# Patient Record
Sex: Male | Born: 1935
Health system: Southern US, Community
[De-identification: ages and names within clinical notes are randomized; demographics above are authoritative.]

## PROBLEM LIST (undated history)

## (undated) DIAGNOSIS — I5031 Acute diastolic (congestive) heart failure: Secondary | ICD-10-CM

## (undated) HISTORY — PX: APPENDECTOMY: SHX54

## (undated) HISTORY — DX: Acute diastolic (congestive) heart failure: I50.31

---

## 2019-07-11 ENCOUNTER — Emergency Department (HOSPITAL_COMMUNITY): Payer: Medicare Other

## 2019-07-11 ENCOUNTER — Other Ambulatory Visit: Payer: Self-pay

## 2019-07-11 ENCOUNTER — Inpatient Hospital Stay (HOSPITAL_COMMUNITY)
Admission: EM | Admit: 2019-07-11 | Discharge: 2019-07-15 | DRG: 292 | Disposition: A | Discharge status: Home / Self Care | Payer: Medicare Other | Attending: Internal Medicine | Admitting: Internal Medicine

## 2019-07-11 ENCOUNTER — Encounter (HOSPITAL_COMMUNITY): Payer: Self-pay

## 2019-07-11 DIAGNOSIS — F17210 Nicotine dependence, cigarettes, uncomplicated: Secondary | ICD-10-CM | POA: Diagnosis present

## 2019-07-11 DIAGNOSIS — Z9889 Other specified postprocedural states: Secondary | ICD-10-CM

## 2019-07-11 DIAGNOSIS — I4891 Unspecified atrial fibrillation: Secondary | ICD-10-CM

## 2019-07-11 DIAGNOSIS — I5032 Chronic diastolic (congestive) heart failure: Secondary | ICD-10-CM

## 2019-07-11 DIAGNOSIS — J9811 Atelectasis: Secondary | ICD-10-CM | POA: Diagnosis present

## 2019-07-11 DIAGNOSIS — I5033 Acute on chronic diastolic (congestive) heart failure: Secondary | ICD-10-CM | POA: Diagnosis present

## 2019-07-11 DIAGNOSIS — Z720 Tobacco use: Secondary | ICD-10-CM | POA: Diagnosis not present

## 2019-07-11 DIAGNOSIS — J449 Chronic obstructive pulmonary disease, unspecified: Secondary | ICD-10-CM | POA: Diagnosis present

## 2019-07-11 DIAGNOSIS — I444 Left anterior fascicular block: Secondary | ICD-10-CM | POA: Diagnosis present

## 2019-07-11 DIAGNOSIS — I5031 Acute diastolic (congestive) heart failure: Secondary | ICD-10-CM

## 2019-07-11 DIAGNOSIS — Z79899 Other long term (current) drug therapy: Secondary | ICD-10-CM | POA: Diagnosis not present

## 2019-07-11 DIAGNOSIS — Z20828 Contact with and (suspected) exposure to other viral communicable diseases: Secondary | ICD-10-CM | POA: Diagnosis present

## 2019-07-11 DIAGNOSIS — R0600 Dyspnea, unspecified: Secondary | ICD-10-CM

## 2019-07-11 DIAGNOSIS — J918 Pleural effusion in other conditions classified elsewhere: Secondary | ICD-10-CM | POA: Diagnosis present

## 2019-07-11 DIAGNOSIS — I110 Hypertensive heart disease with heart failure: Secondary | ICD-10-CM | POA: Diagnosis present

## 2019-07-11 DIAGNOSIS — J90 Pleural effusion, not elsewhere classified: Secondary | ICD-10-CM

## 2019-07-11 DIAGNOSIS — R0603 Acute respiratory distress: Secondary | ICD-10-CM | POA: Diagnosis present

## 2019-07-11 DIAGNOSIS — I4819 Other persistent atrial fibrillation: Secondary | ICD-10-CM | POA: Diagnosis present

## 2019-07-11 DIAGNOSIS — Z9981 Dependence on supplemental oxygen: Secondary | ICD-10-CM | POA: Diagnosis not present

## 2019-07-11 HISTORY — DX: Unspecified atrial fibrillation: I48.91

## 2019-07-11 LAB — SARS CORONAVIRUS 2 (TAT 6-24 HRS): SARS Coronavirus 2: NEGATIVE

## 2019-07-11 LAB — TROPONIN I (HIGH SENSITIVITY)
Troponin I (High Sensitivity): 10 ng/L (ref <18)
Troponin I (High Sensitivity): 8 ng/L (ref <18)

## 2019-07-11 LAB — CBC
HCT: 43.5 % (ref 39.0–52.0)
Hemoglobin: 14.4 g/dL (ref 13.0–17.0)
MCH: 30.3 pg (ref 26.0–34.0)
MCHC: 33.1 g/dL (ref 30.0–36.0)
MCV: 91.4 fL (ref 80.0–100.0)
Platelets: 214 10*3/uL (ref 150–400)
RBC: 4.76 MIL/uL (ref 4.22–5.81)
RDW: 13.4 % (ref 11.5–15.5)
WBC: 7.3 10*3/uL (ref 4.0–10.5)
nRBC: 0 % (ref 0.0–0.2)

## 2019-07-11 LAB — HEMOGLOBIN A1C
Hgb A1c MFr Bld: 5.8 % — ABNORMAL HIGH (ref 4.8–5.6)
Mean Plasma Glucose: 119.76 mg/dL

## 2019-07-11 LAB — ECHOCARDIOGRAM COMPLETE
Height: 74 in
Weight: 3587.33 oz

## 2019-07-11 LAB — BASIC METABOLIC PANEL
Anion gap: 13 (ref 5–15)
BUN: 10 mg/dL (ref 8–23)
CO2: 21 mmol/L — ABNORMAL LOW (ref 22–32)
Calcium: 8.5 mg/dL — ABNORMAL LOW (ref 8.9–10.3)
Chloride: 101 mmol/L (ref 98–111)
Creatinine, Ser: 0.95 mg/dL (ref 0.61–1.24)
GFR calc Af Amer: >60 mL/min (ref >60)
GFR calc non Af Amer: >60 mL/min (ref >60)
Glucose, Bld: 129 mg/dL — ABNORMAL HIGH (ref 70–99)
Potassium: 3.9 mmol/L (ref 3.5–5.1)
Sodium: 135 mmol/L (ref 135–145)

## 2019-07-11 LAB — TSH: TSH: 0.747 u[IU]/mL (ref 0.350–4.500)

## 2019-07-11 LAB — MAGNESIUM
Magnesium: 2.9 mg/dL — ABNORMAL HIGH (ref 1.7–2.4)
Magnesium: 2.1 mg/dL (ref 1.7–2.4)

## 2019-07-11 LAB — BRAIN NATRIURETIC PEPTIDE: B Natriuretic Peptide: 300.3 pg/mL — ABNORMAL HIGH (ref 0.0–100.0)

## 2019-07-11 MED ORDER — NICOTINE 21 MG/24HR TD PT24
21.0000 mg | MEDICATED_PATCH | Freq: Every day | TRANSDERMAL | Status: DC
  Administered 2019-07-11 – 2019-07-15 (×5): 21 mg via TRANSDERMAL
  Filled 2019-07-11 (×5): qty 1

## 2019-07-11 MED ORDER — SODIUM CHLORIDE 0.9 % IV SOLN
1.0000 g | Freq: Once | INTRAVENOUS | Status: AC
  Administered 2019-07-11: 1 g via INTRAVENOUS
  Filled 2019-07-11: qty 10

## 2019-07-11 MED ORDER — ALBUTEROL SULFATE HFA 108 (90 BASE) MCG/ACT IN AERS
2.0000 | INHALATION_SPRAY | RESPIRATORY_TRACT | Status: AC
  Administered 2019-07-11: 09:00:00 2 via RESPIRATORY_TRACT
  Filled 2019-07-11: qty 6.7

## 2019-07-11 MED ORDER — AZITHROMYCIN 250 MG PO TABS
500.0000 mg | ORAL_TABLET | Freq: Once | ORAL | Status: AC
  Administered 2019-07-11: 500 mg via ORAL
  Filled 2019-07-11: qty 2

## 2019-07-11 MED ORDER — POTASSIUM CHLORIDE CRYS ER 20 MEQ PO TBCR
40.0000 meq | EXTENDED_RELEASE_TABLET | Freq: Once | ORAL | Status: AC
  Administered 2019-07-11: 40 meq via ORAL
  Filled 2019-07-11: qty 2

## 2019-07-11 MED ORDER — APIXABAN 5 MG PO TABS
5.0000 mg | ORAL_TABLET | Freq: Two times a day (BID) | ORAL | Status: DC
  Administered 2019-07-11 (×2): 5 mg via ORAL
  Filled 2019-07-11 (×3): qty 1

## 2019-07-11 MED ORDER — AEROCHAMBER PLUS FLO-VU LARGE MISC
1.0000 | Freq: Once | Status: AC
  Administered 2019-07-11: 09:00:00 1

## 2019-07-11 MED ORDER — IPRATROPIUM-ALBUTEROL 0.5-2.5 (3) MG/3ML IN SOLN
3.0000 mL | Freq: Once | RESPIRATORY_TRACT | Status: AC
  Administered 2019-07-11: 16:00:00 3 mL via RESPIRATORY_TRACT
  Filled 2019-07-11: qty 3

## 2019-07-11 MED ORDER — FUROSEMIDE 10 MG/ML IJ SOLN
40.0000 mg | INTRAMUSCULAR | Status: AC
  Administered 2019-07-11: 12:00:00 40 mg via INTRAVENOUS
  Filled 2019-07-11: qty 4

## 2019-07-11 MED ORDER — POTASSIUM CHLORIDE CRYS ER 20 MEQ PO TBCR
20.0000 meq | EXTENDED_RELEASE_TABLET | Freq: Two times a day (BID) | ORAL | Status: DC
  Administered 2019-07-12 – 2019-07-15 (×7): 20 meq via ORAL
  Filled 2019-07-11 (×7): qty 1

## 2019-07-11 MED ORDER — FUROSEMIDE 10 MG/ML IJ SOLN
80.0000 mg | Freq: Two times a day (BID) | INTRAMUSCULAR | Status: DC
  Administered 2019-07-12 (×2): 80 mg via INTRAVENOUS
  Filled 2019-07-11 (×2): qty 8

## 2019-07-11 MED ORDER — IOHEXOL 350 MG/ML SOLN
75.0000 mL | Freq: Once | INTRAVENOUS | Status: AC | PRN
  Administered 2019-07-11: 75 mL via INTRAVENOUS

## 2019-07-11 MED ORDER — AEROCHAMBER PLUS FLO-VU LARGE MISC
Status: AC
  Administered 2019-07-11: 1
  Filled 2019-07-11: qty 1

## 2019-07-11 MED ORDER — FUROSEMIDE 10 MG/ML IJ SOLN: 40.0000 mg | Freq: Once | INTRAMUSCULAR | Status: DC

## 2019-07-11 MED ORDER — DILTIAZEM LOAD VIA INFUSION
20.0000 mg | Freq: Once | INTRAVENOUS | Status: AC
  Administered 2019-07-11: 20 mg via INTRAVENOUS
  Filled 2019-07-11: qty 20

## 2019-07-11 MED ORDER — ASPIRIN 81 MG PO CHEW
324.0000 mg | CHEWABLE_TABLET | Freq: Once | ORAL | Status: AC
  Administered 2019-07-11: 324 mg via ORAL
  Filled 2019-07-11: qty 4

## 2019-07-11 MED ORDER — DILTIAZEM HCL-DEXTROSE 125-5 MG/125ML-% IV SOLN (PREMIX)
5.0000 mg/h | INTRAVENOUS | Status: DC
  Administered 2019-07-11 (×3): 5 mg/h via INTRAVENOUS
  Filled 2019-07-11 (×2): qty 125

## 2019-07-11 MED ORDER — FUROSEMIDE 10 MG/ML IJ SOLN
80.0000 mg | Freq: Once | INTRAMUSCULAR | Status: AC
  Administered 2019-07-11: 18:00:00 80 mg via INTRAVENOUS
  Filled 2019-07-11: qty 8

## 2019-07-11 NOTE — ED Notes (Signed)
ED TO INPATIENT HANDOFF REPORT  ED Nurse Name and Phone #: Seward Grater [phone]  S Name/Age/Gender Alfred Mercado 83 y.o. male Room/Bed: 015C/015C  Code Status   Code Status: Full Code  Home/SNF/Other Home Patient oriented to: self, place, time and situation Is this baseline? Yes   Triage Complete: Triage complete  Chief Complaint afib  Triage Note Pt BIB [facility], Pt c/o SHOB x1 week. Pt reports he has not seen a doctor in over 20 years. EMS reports pt is in A-fib, but pt is unaware how long that has been going on. Pt is currently on 2L of O2 but does not wear O2 at home. EMS gave 2 grams of Mg and 125 of solumedrol.   Allergies No Known Allergies  Level of Care/Admitting Diagnosis ED Disposition    ED Disposition Condition Comment   Admit  Hospital Area: [facility] [[id number]]  Level of Care: Telemetry Cardiac [103]  Covid Evaluation: N/A  Diagnosis: Atrial fibrillation with RVR [facility]) [[id number]]  Admitting Physician: [name] [[id number]]  Attending Physician: [name] [[id number]]  Estimated length of stay: 3 - 4 days  Certification:: I certify this patient will need inpatient services for at least 2 midnights  PT Class (Do Not Modify): Inpatient [101]  PT Acc Code (Do Not Modify): Private [1]       B Medical/Surgery History   A IV Location/Drains/Wounds Patient Lines/Drains/Airways Status   Active Line/Drains/Airways    Name:   Placement date:   Placement time:   Site:   Days:   Peripheral IV 07/11/19 Left Arm   07/11/19    0855    Arm   less than 1   Peripheral IV 07/11/19 Right Hand   07/11/19    0859    Hand   less than 1          Intake/Output Last 24 hours  Intake/Output Summary (Last 24 hours) at 07/11/2019 1325 Last data filed at 07/11/2019 1234 Gross per 24 hour  Intake 131.74 ml  Output 350 ml  Net -218.26 ml    Labs/Imaging Results for orders placed or performed during the hospital encounter of 07/11/19 (from the  past 48 hour(s))  Basic metabolic panel     Status: Abnormal   Collection Time: 07/11/19  8:55 AM  Result Value Ref Range   Sodium 135 135 - 145 mmol/L   Potassium 3.9 3.5 - 5.1 mmol/L   Chloride 101 98 - 111 mmol/L   CO2 21 (L) 22 - 32 mmol/L   Glucose, Bld 129 (H) 70 - 99 mg/dL   BUN 10 8 - 23 mg/dL   Creatinine, Ser 4.01 0.61 - 1.24 mg/dL   Calcium 8.5 (L) 8.9 - 10.3 mg/dL   GFR calc non Af Amer >60 >60 mL/min   GFR calc Af Amer >60 >60 mL/min   Anion gap 13 5 - 15    Comment: Performed at [facility], [address], [place], [place] [address]  Magnesium     Status: Abnormal   Collection Time: 07/11/19  8:55 AM  Result Value Ref Range   Magnesium 2.9 (H) 1.7 - 2.4 mg/dL    Comment: Performed at [facility], [address], [place], [place] [address]  CBC     Status: None   Collection Time: 07/11/19  8:55 AM  Result Value Ref Range   WBC 7.3 4.0 - 10.5 K/uL   RBC 4.76 4.22 - 5.81 MIL/uL   Hemoglobin 14.4  13.0 - 17.0 g/dL   HCT 78.2 95.6 - 21.3 %   MCV 91.4 80.0 - 100.0 fL   MCH 30.3 26.0 - 34.0 pg   MCHC 33.1 30.0 - 36.0 g/dL   RDW 08.6 57.8 - 46.9 %   Platelets 214 150 - 400 K/uL   nRBC 0.0 0.0 - 0.2 %    Comment: Performed at [facility], [address], [place], [place] [address]  Troponin I (High Sensitivity)     Status: None   Collection Time: 07/11/19  8:55 AM  Result Value Ref Range   Troponin I (High Sensitivity) 10 <18 ng/L    Comment: (NOTE) Elevated high sensitivity troponin I (hsTnI) values and significant  changes across serial measurements may suggest ACS but many other  chronic and acute conditions are known to elevate hsTnI results.  Refer to the "Links" section for chest pain algorithms and additional  guidance. Performed at [facility], [address] [place], [place] [address]   Brain natriuretic peptide     Status: Abnormal   Collection Time: 07/11/19  8:55 AM  Result Value Ref Range   B Natriuretic Peptide 300.3  (H) 0.0 - 100.0 pg/mL    Comment: Performed at [facility], [address], [place], [place] [address]  Troponin I (High Sensitivity)     Status: None   Collection Time: 07/11/19 11:43 AM  Result Value Ref Range   Troponin I (High Sensitivity) 8 <18 ng/L    Comment: (NOTE) Elevated high sensitivity troponin I (hsTnI) values and significant  changes across serial measurements may suggest ACS but many other  chronic and acute conditions are known to elevate hsTnI results.  Refer to the "Links" section for chest pain algorithms and additional  guidance. Performed at [facility], [address] [place], [place] [address]   Magnesium     Status: None   Collection Time: 07/11/19 11:43 AM  Result Value Ref Range   Magnesium 2.1 1.7 - 2.4 mg/dL    Comment: Performed at [facility], [address], [place], [place] [address]   Ct Angio Chest Pe W And/or Wo Contrast  Result Date: 07/11/2019 CLINICAL DATA:  Shortness of breath. EXAM: CT ANGIOGRAPHY CHEST WITH CONTRAST TECHNIQUE: Multidetector CT imaging of the chest was performed using the standard protocol during bolus administration of intravenous contrast. Multiplanar CT image reconstructions and MIPs were obtained to evaluate the vascular anatomy. CONTRAST:  52mL OMNIPAQUE IOHEXOL 350 MG/ML SOLN COMPARISON:  Chest x-ray earlier today. FINDINGS: Cardiovascular: The pulmonary arteries are well opacified. There is no evidence of pulmonary embolism. Central pulmonary arteries are normal in caliber. The thoracic aorta is normal in caliber. The heart is top-normal in size. Extensive calcified coronary artery plaque is noted in a 3 vessel distribution and including the left main coronary artery. No pericardial fluid identified. Mediastinum/Nodes: No enlarged mediastinal, hilar, or axillary lymph nodes. Thyroid gland, trachea, and esophagus demonstrate no significant findings. Lungs/Pleura: Moderate right pleural effusion and small  left pleural effusion present. Associated bilateral lower lobe atelectasis, right greater than left. Aerated lungs demonstrate at least pulmonary venous hypertension and potentially component of pulmonary interstitial edema. No pneumothorax or focal masses. Upper Abdomen: Left lobe hepatic cyst has a benign appearance and measures 2.5 cm. Musculoskeletal: No chest wall abnormality. No acute or significant osseous findings. Review of the MIP images confirms the above findings. IMPRESSION: 1. No evidence of pulmonary embolism. 2. Moderate right  pleural effusion and small left pleural effusion with associated bilateral lower lobe atelectasis, right greater than left. 3. Probable pulmonary venous hypertension and interstitial edema. 4. Coronary atherosclerosis with calcified coronary artery plaque noted in a 3 vessel distribution and including the left main coronary artery. Electronically Signed   By: Irish LackGlenn  Yamagata M.D.   On: 07/11/2019 10:50   Dg Chest Port 1 View  Result Date: 07/11/2019 CLINICAL DATA:  Cough and shortness of breath the past week. Atrial fibrillation. EXAM: PORTABLE CHEST 1 VIEW COMPARISON:  None. FINDINGS: Enlarged cardiac silhouette. Prominent pulmonary vasculature and interstitial markings, with Kerley lines. Moderately large right pleural effusion and probable small left pleural effusion. Mild scoliosis. IMPRESSION: Cardiomegaly and acute congestive heart failure Electronically Signed   By: Beckie SaltsSteven  Reid M.D.   On: 07/11/2019 10:01    Pending Labs Unresulted Labs (From admission, onward)    Start     Ordered   07/12/19 0500  Basic metabolic panel  Tomorrow morning,   R     07/11/19 1217   07/12/19 0500  CBC  Tomorrow morning,   R     07/11/19 1217   07/11/19 1218  Hemoglobin A1c  Add-on,   AD     07/11/19 1217   07/11/19 1145  TSH  Once,   R     07/11/19 1145   07/11/19 0853  SARS CORONAVIRUS 2 (TAT 6-24 HRS) Nasopharyngeal Nasopharyngeal Swab  (Asymptomatic/Tier 2 Patients  Labs)  Once,   STAT    Question Answer Comment  Is this test for diagnosis or screening Diagnosis of ill patient   Symptomatic for COVID-19 as defined by CDC Yes   Date of Symptom Onset 07/04/2019   Hospitalized for COVID-19 No   Admitted to ICU for COVID-19 No   Previously tested for COVID-19 No   Resident in a congregate (group) care setting No   Employed in healthcare setting No      07/11/19 0853          Vitals/Pain Today's Vitals   07/11/19 1200 07/11/19 1215 07/11/19 1245 07/11/19 1315  BP: (!) 157/71 (!) 137/92 (!) 142/76 (!) 122/94  Pulse: 100 (!) 157 89   Resp: (!) 38 (!) 38 (!) 30 (!) 22  Temp:      TempSrc:      SpO2: 94% 95% 96%   Weight:      Height:      PainSc:        Isolation Precautions No active isolations  Medications Medications  diltiazem (CARDIZEM) 1 mg/mL load via infusion 20 mg (20 mg Intravenous Bolus from Bag 07/11/19 0927)    And  diltiazem (CARDIZEM) 125 mg in dextrose 5% 125 mL (1 mg/mL) infusion (7.5 mg/hr Intravenous Rate/Dose Change 07/11/19 1007)  apixaban (ELIQUIS) tablet 5 mg (has no administration in time range)  aspirin chewable tablet 324 mg (324 mg Oral Given 07/11/19 0902)  albuterol (VENTOLIN HFA) 108 (90 Base) MCG/ACT inhaler 2 puff (2 puffs Inhalation Given 07/11/19 0903)  AeroChamber Plus Flo-Vu Large MISC 1 each (1 each Other Given 07/11/19 0925)  iohexol (OMNIPAQUE) 350 MG/ML injection 75 mL (75 mLs Intravenous Contrast Given 07/11/19 1022)  furosemide (LASIX) injection 40 mg (40 mg Intravenous Given 07/11/19 1130)  cefTRIAXone (ROCEPHIN) 1 g in sodium chloride 0.9 % 100 mL IVPB (0 g Intravenous Stopped 07/11/19 1206)  azithromycin (ZITHROMAX) tablet 500 mg (500 mg Oral Given 07/11/19 1132)    Mobility walks Low fall risk   Focused Assessments Pulmonary  Assessment Handoff:  Lung sounds:   O2 Device: Nasal Cannula O2 Flow Rate (L/min): 2 L/min      R Recommendations: See Admitting Provider Note  Report  given to:   Additional Notes:  Afib, cardizem gtt

## 2019-07-11 NOTE — Plan of Care (Signed)

## 2019-07-11 NOTE — ED Triage Notes (Signed)
Pt BIB [facility], Pt c/o SHOB x1 week. Pt reports he has not seen a doctor in over 20 years. EMS reports pt is in A-fib, but pt is unaware how long that has been going on. Pt is currently on 2L of O2 but does not wear O2 at home. EMS gave 2 grams of Mg and 125 of solumedrol.

## 2019-07-11 NOTE — ED Provider Notes (Signed)
[facility] EMERGENCY DEPARTMENT Provider Note   CSN: [id number] Arrival date & time: 07/11/19  0840     History   Chief Complaint Chief Complaint  Patient presents with   Shortness of Breath    HPI Alfred Mercado is a 83 y.o. male.     HPI  This patient is an 83 year old male, he denies having chronic medical problems other than chronic lung disease, states that he does not go to the doctor, lives by himself and has been feeling sick for approximately 1 to 2 weeks.  His shortness of breath has been gradually worsening, he was found today to be in respiratory distress by the paramedics when he called for help.  He has had increasing amounts of shortness of breath with cough, he denies fever, he denies being exposed to anybody who has been sick stating that he lives by himself.  The patient has no history of atrial fibrillation that he knows of, he has not been to the doctor in 20 years.  He denies orthopnea but he does have dyspnea on exertion.  Symptoms are persistent, gradually worsening and became severe today.  Paramedics gave the patient Solu-Medrol and albuterol inhaler prior to arrival.  There was minimal improvement.  Heart rate ranging between 140 and 160 bpm.  There was no hypoxia.  The patient still smokes cigarettes.  He does smoke 1 pack of cigarettes per day.  History reviewed. No pertinent past medical history other than below   COPD  There are no active problems to display for this patient.  Takes no daily medicines       Home Medications    Prior to Admission medications   Not on File    Family History History reviewed. No pertinent family history.  Social History Social History   Tobacco Use   Smoking status: Current smoker 1PPD  Substance Use Topics   Alcohol use: Not on file   Drug use: Not on file     Allergies   Patient has no known allergies.   Review of Systems Review of Systems  All other systems  reviewed and are negative.    Physical Exam Updated Vital Signs BP (!) 146/81    Pulse (!) 138    Temp 97.7 F (36.5 C) (Oral)    Resp (!) 31    SpO2 94%   Physical Exam Vitals signs and nursing note reviewed.  Constitutional:      General: He is in acute distress.     Appearance: He is well-developed.  HENT:     Head: Normocephalic and atraumatic.     Mouth/Throat:     Pharynx: No oropharyngeal exudate.  Eyes:     General: No scleral icterus.       Right eye: No discharge.        Left eye: No discharge.     Conjunctiva/sclera: Conjunctivae normal.     Pupils: Pupils are equal, round, and reactive to light.  Neck:     Musculoskeletal: Normal range of motion and neck supple.     Thyroid: No thyromegaly.     Vascular: No JVD.  Cardiovascular:     Rate and Rhythm: Tachycardia present. Rhythm irregular.     Heart sounds: Normal heart sounds. No murmur. No friction rub. No gallop.   Pulmonary:     Effort: Tachypnea, accessory muscle usage and respiratory distress present.     Breath sounds: Wheezing and rhonchi present. No rales.  Chest:  Chest wall: No tenderness.  Abdominal:     General: Bowel sounds are normal. There is no distension.     Palpations: Abdomen is soft. There is no mass.     Tenderness: There is no abdominal tenderness.  Musculoskeletal: Normal range of motion.        General: No tenderness.     Right lower leg: Edema present.     Left lower leg: Edema present.     Comments: 2+ symmetrical edema of the pretibial ankle and foot areas bilaterally  Lymphadenopathy:     Cervical: No cervical adenopathy.  Skin:    General: Skin is warm and dry.     Findings: No erythema or rash.  Neurological:     Mental Status: He is alert.     Coordination: Coordination normal.     Comments: The patient is able to talk, he speaks in shortened sentences due to respiratory distress but is able to follow all my commands and has normal level of alertness strength and  sensation.  Psychiatric:        Behavior: Behavior normal.      ED Treatments / Results  Labs (all labs ordered are listed, but only abnormal results are displayed) Labs Reviewed  BASIC METABOLIC PANEL - Abnormal; Notable for the following components:      Result Value   CO2 21 (*)    Glucose, Bld 129 (*)    Calcium 8.5 (*)    All other components within normal limits  MAGNESIUM - Abnormal; Notable for the following components:   Magnesium 2.9 (*)    All other components within normal limits  SARS CORONAVIRUS 2 (TAT 6-24 HRS)  CBC  TSH  BRAIN NATRIURETIC PEPTIDE  TROPONIN I (HIGH SENSITIVITY)  TROPONIN I (HIGH SENSITIVITY)    EKG EKG Interpretation  Date/Time:  Saturday July 11 2019 08:47:53 EST Ventricular Rate:  143 PR Interval:    QRS Duration: 103 QT Interval:  306 QTC Calculation: 479 R Axis:   -52 Text Interpretation: afib with rvr Left anterior fascicular block Low voltage, extremity leads Probable anteroseptal infarct, old ST depression, probably rate related No old tracing to compare Confirmed by Noemi Chapel [id number]) on 07/11/2019 9:07:11 AM   Radiology Ct Angio Chest Pe W And/or Wo Contrast  Result Date: 07/11/2019 CLINICAL DATA:  Shortness of breath. EXAM: CT ANGIOGRAPHY CHEST WITH CONTRAST TECHNIQUE: Multidetector CT imaging of the chest was performed using the standard protocol during bolus administration of intravenous contrast. Multiplanar CT image reconstructions and MIPs were obtained to evaluate the vascular anatomy. CONTRAST:  22mL OMNIPAQUE IOHEXOL 350 MG/ML SOLN COMPARISON:  Chest x-ray earlier today. FINDINGS: Cardiovascular: The pulmonary arteries are well opacified. There is no evidence of pulmonary embolism. Central pulmonary arteries are normal in caliber. The thoracic aorta is normal in caliber. The heart is top-normal in size. Extensive calcified coronary artery plaque is noted in a 3 vessel distribution and including the left main  coronary artery. No pericardial fluid identified. Mediastinum/Nodes: No enlarged mediastinal, hilar, or axillary lymph nodes. Thyroid gland, trachea, and esophagus demonstrate no significant findings. Lungs/Pleura: Moderate right pleural effusion and small left pleural effusion present. Associated bilateral lower lobe atelectasis, right greater than left. Aerated lungs demonstrate at least pulmonary venous hypertension and potentially component of pulmonary interstitial edema. No pneumothorax or focal masses. Upper Abdomen: Left lobe hepatic cyst has a benign appearance and measures 2.5 cm. Musculoskeletal: No chest wall abnormality. No acute or significant osseous findings. Review of the MIP  images confirms the above findings. IMPRESSION: 1. No evidence of pulmonary embolism. 2. Moderate right pleural effusion and small left pleural effusion with associated bilateral lower lobe atelectasis, right greater than left. 3. Probable pulmonary venous hypertension and interstitial edema. 4. Coronary atherosclerosis with calcified coronary artery plaque noted in a 3 vessel distribution and including the left main coronary artery. Electronically Signed   By: Irish Lack M.D.   On: 07/11/2019 10:50   Dg Chest Port 1 View  Result Date: 07/11/2019 CLINICAL DATA:  Cough and shortness of breath the past week. Atrial fibrillation. EXAM: PORTABLE CHEST 1 VIEW COMPARISON:  None. FINDINGS: Enlarged cardiac silhouette. Prominent pulmonary vasculature and interstitial markings, with Kerley lines. Moderately large right pleural effusion and probable small left pleural effusion. Mild scoliosis. IMPRESSION: Cardiomegaly and acute congestive heart failure Electronically Signed   By: Beckie Salts M.D.   On: 07/11/2019 10:01    Procedures .Critical Care Performed by: Eber Hong, MD Authorized by: Eber Hong, MD   Critical care provider statement:    Critical care time (minutes):  35   Critical care time was exclusive  of:  Separately billable procedures and treating other patients and teaching time   Critical care was necessary to treat or prevent imminent or life-threatening deterioration of the following conditions:  Cardiac failure and respiratory failure   Critical care was time spent personally by me on the following activities:  Blood draw for specimens, development of treatment plan with patient or surrogate, discussions with consultants, evaluation of patient's response to treatment, examination of patient, obtaining history from patient or surrogate, ordering and performing treatments and interventions, ordering and review of laboratory studies, ordering and review of radiographic studies, pulse oximetry, re-evaluation of patient's condition and review of old charts   (including critical care time)  Medications Ordered in ED Medications  diltiazem (CARDIZEM) 1 mg/mL load via infusion 20 mg (20 mg Intravenous Bolus from Bag 07/11/19 0927)    And  diltiazem (CARDIZEM) 125 mg in dextrose 5% 125 mL (1 mg/mL) infusion (5 mg/hr Intravenous Rate/Dose Verify 07/11/19 1007)  furosemide (LASIX) injection 40 mg (has no administration in time range)  cefTRIAXone (ROCEPHIN) 1 g in sodium chloride 0.9 % 100 mL IVPB (has no administration in time range)  azithromycin (ZITHROMAX) tablet 500 mg (has no administration in time range)  aspirin chewable tablet 324 mg (324 mg Oral Given 07/11/19 0902)  albuterol (VENTOLIN HFA) 108 (90 Base) MCG/ACT inhaler 2 puff (2 puffs Inhalation Given 07/11/19 0903)  AeroChamber Plus Flo-Vu Large MISC 1 each (1 each Other Given 07/11/19 0925)  iohexol (OMNIPAQUE) 350 MG/ML injection 75 mL (75 mLs Intravenous Contrast Given 07/11/19 1022)     Initial Impression / Assessment and Plan / ED Course  I have reviewed the triage vital signs and the nursing notes.  Pertinent labs & imaging results that were available during my care of the patient were reviewed by me and considered in my  medical decision making (see chart for details).  Clinical Course as of Jul 10 1104  Sat Jul 11, 2019  1610 I have personally viewed and interpreted the anterior posterior chest x-ray, there appears to be decreased lung volumes, what appears to be some pulmonary edema or vascular congestion, right hemithorax with possible effusion, awaiting radiology read.Magnesium is high, white blood cell and red blood cells are normal, metabolic panel is unremarkable and the troponin measures at 10.  Heart rate is improving on Cardizem drip   [BM]  Clinical Course User Index [BM] Eber HongMiller, Sheritta Deeg, MD       This patient is ill-appearing, he appears to have new onset of A. fib with rapid ventricular response, it is unclear when this started but given his history of smoking and lung disease and his symptoms which been ongoing for 1 to 2 weeks I suspect he is outside of the window for cardioversion safety.  Given the edema of his legs and the increasing shortness of breath there is also possibility of congestive heart failure which is driving the atrial fibrillation.  Labs have been ordered, coronavirus test ordered, chest x-ray, albuterol, the patient is agreeable to be admitted to the hospital.  Due to the atrial fibrillation will start Cardizem with a drip, the patient is critically ill.  CHA2DS2-VASc score = 2 however given the patient's presentation I suspect he does have some element of hypertension congestive heart failure and vascular disease.  Will need to be adjusted as the patient comes into the hospital for evaluation.  He will be given an aspirin at this time  D/w internal medicine teaching service team who willa dmit to high level of care Heart rate improved with cardizem drip CT without acute PE - but confirms effusions No masses seen. I discussed with pt who is agreeable to stay the night  Burnadette PopCurtis Jamerson was evaluated in Emergency Department on 07/11/2019 for the symptoms described in the  history of present illness. He was evaluated in the context of the global COVID-19 pandemic, which necessitated consideration that the patient might be at risk for infection with the SARS-CoV-2 virus that causes COVID-19. Institutional protocols and algorithms that pertain to the evaluation of patients at risk for COVID-19 are in a state of rapid change based on information released by regulatory bodies including the CDC and federal and state organizations. These policies and algorithms were followed during the patient's care in the ED.   Final Clinical Impressions(s) / ED Diagnoses   Final diagnoses:  Atrial fibrillation with rapid ventricular response (HCC)  Pleural effusion, right    ED Discharge Orders         Ordered    Amb referral to AFIB Clinic     07/11/19 0853           Eber HongMiller, Malika Demario, MD 07/11/19 1106

## 2019-07-11 NOTE — H&P (Addendum)
Date: 07/11/2019               Patient Name:  Alfred Mercado MRN: [id number]  DOB: 1935-11-23 Age / Sex: 83 y.o., male   PCP: Patient, No Pcp Per         Medical Service: Internal Medicine Teaching Service         Attending Physician: Dr. [name], MD    First Contact: Dr. Thom Chimes Pager: [phone]  Second Contact: Dr. Jodelle Red Pager: [phone]       After Hours (After 5p/  First Contact Pager: [phone]  weekends / holidays): Second Contact Pager: [phone]   Chief Complaint: Dyspnea  History of Present Illness:   Alfred Mercado is an 83 year old gentleman with no recorded prior medical history (though he has not seen a physician in 20 years) who presented to the hospital with a 1 week history of shortness of breath.  He states that he was in his usual state of health until abut a week ago when he began experiencing dyspnea on exertion and orthopnea. 2 days ago, he experienced left sided chest tightness. Prior to this new onset SOB, he was doing "so so" but has never had acute symptoms. He denies fevers, chills but does endorse non-productive cough. He reports that he has not seen a provider in over 20 years and has been doing well. He also reports of a few day history of intermittent palpitations. Intermittent history of nausea without vomiting, and right sided abdominal pain that pt relates to recent bloating causing him to not be able to wear his belts at home. Presently, he can only walk for about 100 yards before stopped due to SOB. Denies sick contacts at home as pt lives by himself.  In the ED, pt presented in A Fib with heart rates in the 140-170s per EMS. BP 126/59, RR 24, and O2 97 on 2L [place]. Troponin 10. BNP 300.3. COVID-19 negative. BMP unremarkable. No leukocytosis or anemia. Mg high at 2.9. He was started on a diltiazem drip, with improved heart rates. Internal medicine called for admission.  Meds:  No current facility-administered medications on file prior to  encounter.    Current Outpatient Medications on File Prior to Encounter  Medication Sig Dispense Refill  . acetaminophen (TYLENOL) 325 MG tablet Take 650 mg by mouth every 6 (six) hours as needed for mild pain or headache.    Marland Kitchen ePHEDrine-guaiFENesin (PRIMATENE ASTHMA PO) Take 2 puffs by mouth every 6 (six) hours as needed (shortness of breath).    Marland Kitchen ibuprofen (ADVIL) 200 MG tablet Take 400 mg by mouth every 6 (six) hours as needed for headache or mild pain.      Allergies: Allergies as of 07/11/2019  . (No Known Allergies)   History reviewed. No pertinent past medical history.  Family History:  He has not seen any of his family members in 30 years.  Social History:  Current cigarette smoker for over 40 years. Occasional EtOH use presently, states he used to drink heavily but quit about 2 years ago. Woke up one day and realized "I was an idiot (for drinking)".   Spends his time watching sports, meeting intelligent people, and playing poker. 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 Currently living in [place]. Has been living in a motel for 11 years because he is close friends with the motel owner and his family who check-in on him.   Review of Systems: Review of Systems  Constitutional: Negative for chills and fever.  HENT: Negative for congestion and sore throat.   Eyes: Negative for blurred vision and double vision.  Respiratory: Positive for cough and shortness of breath. Negative for sputum production.   Cardiovascular: Positive for chest pain, palpitations and orthopnea.  Gastrointestinal: Positive for abdominal pain and nausea. Negative for vomiting.  Genitourinary: Negative for dysuria, frequency and urgency.  Musculoskeletal: Negative for falls.  Skin:       Bruising and abrasions over bilateral LEs  Neurological: Negative for dizziness, focal weakness and headaches.   Physical Exam: Blood pressure 131/69, pulse 76, temperature 97.8 F (36.6 C), temperature source  Oral, resp. rate (!) 22, height 6\' 2"  (1.88 m), weight 101.7 kg, SpO2 91 %. Physical Exam Vitals signs and nursing note reviewed.  Constitutional:      Appearance: He is ill-appearing.  HENT:     Head: Normocephalic and atraumatic.     Mouth/Throat:     Mouth: Mucous membranes are moist.  Neck:     Musculoskeletal: Normal range of motion.  Cardiovascular:     Rate and Rhythm: Tachycardia present. Rhythm irregular.     Heart sounds: Normal heart sounds. No murmur. No friction rub. No gallop.   Pulmonary:     Effort: Tachypnea present.     Comments: Decreased breath sounds at R base.  Abdominal:     General: Bowel sounds are normal. There is distension.     Palpations: Abdomen is soft.  Musculoskeletal:     Right lower leg: Edema present.     Left lower leg: Edema present.     Comments: 2+ pitting edema to the mid-shin  Skin:    General: Skin is warm and dry.  Neurological:     General: No focal deficit present.     Mental Status: He is alert.  Psychiatric:        Mood and Affect: Mood normal.    EKG: personally reviewed my interpretation is atrial fibrillation with rapid ventricular response, rate approximately 140bpm.  CXR: personally reviewed my interpretation is cardiomegaly, pulmonary vascular congestion, moderate R pleural effusion, and L pleural effusion obscuring costophrenic angle.   Assessment & Plan by Problem: Active Problems:   Atrial fibrillation with RVR [facility](HCC)  Mr. Lucrezia EuropeJamerson is an 83 year old M with no recorded prior medical history who presented to the hospital with a 1 week history of shortness of breath and found to be in A fib with RVR with bilateral pleural effusions R > L.  Dyspnea - suspect acute CHF exacerbation Pt endorsing 1 week history of palpitations, SOB, and abdominal distension. Exam consistent with acute heart failure exacerbation with bilateral LE edema and crackles on ausculation. CXR significant for cardiomegaly and acute CHF. Received one  dose of azithromycin and ceftriaxone in the ED to cover for CAP. COVID-19 negative.  - question if CHF is driving A fib or vice versa - stat echo - furosemide 80mg  IV BID - daily wts - strict I/Os - continuous pulse oximetry - PRN oxygen to maintain O2 >92%  Pt will need thoracentesis for R sided pleural effusion after stabilization of his heart rates, hopeful for procedure tomorrow.     A fib with RVR Pt presented with HRs in the 150-170s in A Fib per EMS. Has no history of a fib, but endorses a week history of palpitations. BP normotensive. CHADS2-VASc of 2. - loaded with diltiazem 20mg  IV - continue diltiazem infusion 5-15mg /hr - HR improving at this time, no  hemodynamic instability or indication for cardioversion at this time - start apixaban 5mg  BID for anticoagulation  - continuous cardiac monitoring    ?COPD Pt has a 40 pack year smoking history. Received albuterol 2 puffs once in the ED. - will order for duoneb treatment once - expect most of his shortness of breath being driven by pleural effusions and subsequent poor lung aeration  - will give nicotine patch 21mg  transdermal daily   Diet: low Na diet Fluids: none VTE ppx: apixaban 5mg  BID CODE STATUS: FULL CODE   Dispo: Admit patient to Inpatient with expected length of stay greater than 2 midnights.   Signed: [name], MD 07/11/2019, 3:37 PM  Pager: [phone] Internal Medicine Teaching Service

## 2019-07-12 ENCOUNTER — Inpatient Hospital Stay (HOSPITAL_COMMUNITY): Payer: Medicare Other

## 2019-07-12 DIAGNOSIS — I4891 Unspecified atrial fibrillation: Secondary | ICD-10-CM

## 2019-07-12 DIAGNOSIS — I5031 Acute diastolic (congestive) heart failure: Secondary | ICD-10-CM

## 2019-07-12 DIAGNOSIS — I5032 Chronic diastolic (congestive) heart failure: Secondary | ICD-10-CM

## 2019-07-12 DIAGNOSIS — J90 Pleural effusion, not elsewhere classified: Secondary | ICD-10-CM

## 2019-07-12 DIAGNOSIS — F17210 Nicotine dependence, cigarettes, uncomplicated: Secondary | ICD-10-CM

## 2019-07-12 LAB — BODY FLUID CELL COUNT WITH DIFFERENTIAL
Eos, Fluid: 0 %
Lymphs, Fluid: 95 %
Monocyte-Macrophage-Serous Fluid: 1 % — ABNORMAL LOW (ref 50–90)
Neutrophil Count, Fluid: 4 % (ref 0–25)
Total Nucleated Cell Count, Fluid: 870 cu mm (ref 0–1000)

## 2019-07-12 LAB — CBC
HCT: 41.5 % (ref 39.0–52.0)
Hemoglobin: 13.8 g/dL (ref 13.0–17.0)
MCH: 30.3 pg (ref 26.0–34.0)
MCHC: 33.3 g/dL (ref 30.0–36.0)
MCV: 91.2 fL (ref 80.0–100.0)
Platelets: 213 10*3/uL (ref 150–400)
RBC: 4.55 MIL/uL (ref 4.22–5.81)
RDW: 13.6 % (ref 11.5–15.5)
WBC: 7.2 10*3/uL (ref 4.0–10.5)
nRBC: 0 % (ref 0.0–0.2)

## 2019-07-12 LAB — HEPATIC FUNCTION PANEL
ALT: 30 U/L (ref 0–44)
AST: 31 U/L (ref 15–41)
Albumin: 3.6 g/dL (ref 3.5–5.0)
Alkaline Phosphatase: 60 U/L (ref 38–126)
Bilirubin, Direct: 0.3 mg/dL — ABNORMAL HIGH (ref 0.0–0.2)
Indirect Bilirubin: 0.7 mg/dL (ref 0.3–0.9)
Total Bilirubin: 1 mg/dL (ref 0.3–1.2)
Total Protein: 6 g/dL — ABNORMAL LOW (ref 6.5–8.1)

## 2019-07-12 LAB — BASIC METABOLIC PANEL
Anion gap: 14 (ref 5–15)
BUN: 17 mg/dL (ref 8–23)
CO2: 24 mmol/L (ref 22–32)
Calcium: 9.3 mg/dL (ref 8.9–10.3)
Chloride: 99 mmol/L (ref 98–111)
Creatinine, Ser: 1.03 mg/dL (ref 0.61–1.24)
GFR calc Af Amer: >60 mL/min (ref >60)
GFR calc non Af Amer: >60 mL/min (ref >60)
Glucose, Bld: 156 mg/dL — ABNORMAL HIGH (ref 70–99)
Potassium: 4.3 mmol/L (ref 3.5–5.1)
Sodium: 137 mmol/L (ref 135–145)

## 2019-07-12 LAB — RAPID URINE DRUG SCREEN, HOSP PERFORMED
Amphetamines: NOT DETECTED
Barbiturates: NOT DETECTED
Benzodiazepines: NOT DETECTED
Cocaine: NOT DETECTED
Opiates: NOT DETECTED
Tetrahydrocannabinol: NOT DETECTED

## 2019-07-12 LAB — PROTEIN, PLEURAL OR PERITONEAL FLUID: Total protein, fluid: <3 g/dL

## 2019-07-12 LAB — HIV ANTIBODY (ROUTINE TESTING W REFLEX): HIV Screen 4th Generation wRfx: NONREACTIVE

## 2019-07-12 LAB — LACTATE DEHYDROGENASE, PLEURAL OR PERITONEAL FLUID: LD, Fluid: 52 U/L — ABNORMAL HIGH (ref 3–23)

## 2019-07-12 LAB — LACTATE DEHYDROGENASE: LDH: 130 U/L (ref 98–192)

## 2019-07-12 MED ORDER — DILTIAZEM HCL ER 60 MG PO CP12
60.0000 mg | ORAL_CAPSULE | Freq: Two times a day (BID) | ORAL | Status: DC
  Administered 2019-07-12: 60 mg via ORAL
  Filled 2019-07-12 (×3): qty 1

## 2019-07-12 MED ORDER — DILTIAZEM HCL ER 90 MG PO CP12
90.0000 mg | ORAL_CAPSULE | Freq: Two times a day (BID) | ORAL | Status: DC
  Administered 2019-07-12 – 2019-07-15 (×6): 90 mg via ORAL
  Filled 2019-07-12 (×7): qty 1

## 2019-07-12 MED ORDER — LIDOCAINE HCL (PF) 1 % IJ SOLN
INTRAMUSCULAR | Status: AC
  Filled 2019-07-12: qty 30

## 2019-07-12 MED ORDER — APIXABAN 5 MG PO TABS
5.0000 mg | ORAL_TABLET | Freq: Two times a day (BID) | ORAL | Status: DC
  Administered 2019-07-12 – 2019-07-15 (×7): 5 mg via ORAL
  Filled 2019-07-12 (×7): qty 1

## 2019-07-12 MED ORDER — GUAIFENESIN-DM 100-10 MG/5ML PO SYRP
5.0000 mL | ORAL_SOLUTION | ORAL | Status: DC | PRN
  Administered 2019-07-12: 5 mL via ORAL
  Filled 2019-07-12: qty 5

## 2019-07-12 NOTE — Progress Notes (Signed)
   Subjective: Pt seen at the bedside this morning after his thoracentesis. Feels improved, though had trouble sleeping due to the wires he is connected to and recurrent nightmares. Is asking about being able to eat at this time. Has no acute concerns.   Objective:  Vital signs in last 24 hours: Vitals:   07/11/19 2030 07/12/19 0022 07/12/19 0338 07/12/19 0420  BP:  122/74 134/88   Pulse:  95 (!) 57 96  Resp:  (!) 21 (!) 21   Temp: (!) 97.5 F (36.4 C)  97.6 F (36.4 C)   TempSrc: Oral  Oral   SpO2:  92% 96% 96%  Weight:   97.1 kg   Height:       Physical Exam Vitals signs and nursing note reviewed.  Cardiovascular:     Rate and Rhythm: Tachycardia present. Rhythm irregular.     Comments: HR between 100-115 on tele Pulmonary:     Effort: Pulmonary effort is normal. No tachypnea or respiratory distress.     Comments: Improved aeration over the R lung. Scattered wheeze on R side. Normal breath sounds on L. On 2L [place] with O2 saturations >95 Musculoskeletal:     Right lower leg: Edema present.     Left lower leg: Edema present.     Comments: Bilateral +2 pitting edema in LEs, slightly improved from yesterday.  Skin:    Comments: Band-aid over thoracentesis site. No active bleeding, drainage, or erythema.  Neurological:     Mental Status: He is alert.    Assessment/Plan:  Active Problems:   Atrial fibrillation with RVR [facility])  [name] is an 83 year old M with no recorded prior medical history who presented to the hospital with a 1 week history of shortness of breath and found to be in A fib with RVR with bilateral pleural effusions R > L.  New onset diastolic heart failure Pt with history of palpitations, SOB, and abdominal distension. Exam with bilateral LE edema and crackles on ausculation. CXR significant for cardiomegaly and acute CHF. CT chest with moderate R pleural effusion and small left pleural effusion with associated lower lobe atelectasis R > L. COVID-19  negative.  - echo with LVEF 50-55% - 2.4L urine output yesterday, with 1.5L net balance - continue furosemide 80mg  IV BID - daily wts - strict I/Os - continuous pulse oximetry - wean oxygen as tolerated  IR performed US guided thoracentesis this morning for pt's R sided pleural effusion - yielded 1.55L clear gold fluid  - labs sent - follow-up CXR without pneumothorax    New Onset A fib with RVR Pt presented with HRs in the 150-170s in A Fib per EMS. BP remains normotensive. CHADS2-VASc of 2. - off diltiazem infusion this morning - transition to PO diltiazem 60mg  PO BID today - apixaban 5mg  BID for anticoagulation  - continuous cardiac monitoring    ?COPD Pt has a 40 pack year smoking history. - expect most of his dyspnea driven by pleural effusions and subsequent poor lung aeration  - will give nicotine patch 21mg  transdermal daily   Diet: low Na diet Fluids: none VTE ppx: apixaban 5mg  BID CODE STATUS: FULL CODE   Dispo: Anticipated discharge pending clinical improvement and thoracentesis results.    [name], MD 07/12/2019, 6:59 AM Pager: [phone]

## 2019-07-12 NOTE — Procedures (Signed)
PROCEDURE SUMMARY:  Successful image-guided right thoracentesis. Yielded 1.55 liters of clear gold fluid. Patient tolerated procedure well. No immediate complications. EBL < 5 mL.  Specimen was sent for labs. CXR ordered.  Please see imaging section of Epic for full dictation.   Claris Pong Louk PA-C 07/12/2019 10:11 AM

## 2019-07-13 DIAGNOSIS — I4819 Other persistent atrial fibrillation: Secondary | ICD-10-CM

## 2019-07-13 LAB — LIPID PANEL
Cholesterol: 147 mg/dL (ref 0–200)
HDL: 38 mg/dL — ABNORMAL LOW (ref >40)
LDL Cholesterol: 94 mg/dL (ref 0–99)
Total CHOL/HDL Ratio: 3.9 RATIO
Triglycerides: 77 mg/dL (ref <150)
VLDL: 15 mg/dL (ref 0–40)

## 2019-07-13 LAB — BASIC METABOLIC PANEL
Anion gap: 11 (ref 5–15)
BUN: 24 mg/dL — ABNORMAL HIGH (ref 8–23)
CO2: 29 mmol/L (ref 22–32)
Calcium: 8.5 mg/dL — ABNORMAL LOW (ref 8.9–10.3)
Chloride: 93 mmol/L — ABNORMAL LOW (ref 98–111)
Creatinine, Ser: 1.27 mg/dL — ABNORMAL HIGH (ref 0.61–1.24)
GFR calc Af Amer: >60 mL/min (ref >60)
GFR calc non Af Amer: 53 mL/min — ABNORMAL LOW (ref >60)
Glucose, Bld: 109 mg/dL — ABNORMAL HIGH (ref 70–99)
Potassium: 4.2 mmol/L (ref 3.5–5.1)
Sodium: 133 mmol/L — ABNORMAL LOW (ref 135–145)

## 2019-07-13 LAB — GRAM STAIN

## 2019-07-13 LAB — CYTOLOGY - NON PAP

## 2019-07-13 MED ORDER — ACETAMINOPHEN 325 MG PO TABS
650.0000 mg | ORAL_TABLET | Freq: Four times a day (QID) | ORAL | Status: DC | PRN
  Administered 2019-07-13: 650 mg via ORAL
  Filled 2019-07-13: qty 2

## 2019-07-13 MED ORDER — METOPROLOL TARTRATE 12.5 MG HALF TABLET
12.5000 mg | ORAL_TABLET | Freq: Two times a day (BID) | ORAL | Status: DC
  Administered 2019-07-13 – 2019-07-15 (×5): 12.5 mg via ORAL
  Filled 2019-07-13 (×5): qty 1

## 2019-07-13 MED ORDER — FUROSEMIDE 10 MG/ML IJ SOLN
80.0000 mg | Freq: Two times a day (BID) | INTRAMUSCULAR | Status: DC
  Administered 2019-07-13 – 2019-07-14 (×4): 80 mg via INTRAVENOUS
  Filled 2019-07-13 (×5): qty 8

## 2019-07-13 MED ORDER — FUROSEMIDE 10 MG/ML IJ SOLN: 60.0000 mg | Freq: Two times a day (BID) | INTRAMUSCULAR | Status: DC

## 2019-07-13 NOTE — Discharge Instructions (Signed)

## 2019-07-13 NOTE — Plan of Care (Signed)
Min assist with adls 

## 2019-07-13 NOTE — Progress Notes (Signed)
   Subjective: Pt seen at the beside this morning. Feels breathing has improved since admission, and was able to sleep better last evening. HR on tele still 110s. On 3L [place].  Objective:  Vital signs in last 24 hours: Vitals:   07/12/19 1630 07/12/19 1952 07/12/19 2031 07/13/19 0606  BP: 111/62 111/71  107/62  Pulse: 92 99 (!) 110 85  Resp:  20  20  Temp: 98.1 F (36.7 C) (!) 97.4 F (36.3 C)  97.9 F (36.6 C)  TempSrc: Oral Oral  Oral  SpO2: 93% 95% 97% 97%  Weight:    88.5 kg  Height:       Physical Exam Vitals signs and nursing note reviewed.  Constitutional:      General: He is not in acute distress. Cardiovascular:     Rate and Rhythm: Tachycardia present. Rhythm irregular.  Pulmonary:     Effort: Pulmonary effort is normal. No tachypnea or respiratory distress.     Comments: On 3L [place]. Diffuse crackles over the R lung. L side clear. Musculoskeletal:     Right lower leg: Edema present.     Left lower leg: Edema present.     Comments: Bilateral LE pitting edema, slightly improved from yesterday  Skin:    Comments: R thoracentesis site covered by bandaid, clean and dry.  Neurological:     Mental Status: He is alert.    Assessment/Plan:  Active Problems:   Atrial fibrillation with rapid ventricular response (HCC)   Pleural effusion, right   Acute heart failure with preserved ejection fraction [facility])   Mr. Santa Genera is an 30 year oldMwith no recorded prior medical history who presented to the hospital with a 1 week history of shortness of breathand found to be in A fib with RVR with bilateral pleural effusions.  New onset diastolic heart failure - echo 11/14 with LVEF 50-55% - 2.2L urine output yesterday with 1.9L net balance - wt down 8.6kg from yesterday, discrepancy between wts and I/Os noted.  - Cr bumped this morning 1.27 < 1.03, will continue to monitor - continue furosemide 80mg  IV BID - continue daily wts, strict I/Os, with continuous pulse oximetry -  wean oxygen as tolerated - will need outpatient follow-up with PCP and cardiology given new HF and afib diagnoses   IR performed US guided thoracentesis yielding 1.55L clear gold fluid.Follow-up CXR without pneumothorax. No organisms on gram stain, culture pending. Light's negative, appears to be transudative effusion    New Onset A fib with RVR Pt presented with HRs in the 150-170s in A Fib per EMS. BP remains normotensive. CHADS2-VASc of 3, with new CHF. - increase PO diltiazem to 90mg  PO BID  - apixaban 5mg  BID for anticoagulation - continuous cardiac monitoring   Tobacco Use Disorder Pt has a 40 pack year smoking history. - will give nicotine patch 21mg  transdermal daily   Diet: low Na diet Fluids:none VTE WUX:[id number] 5mg  BID CODE STATUS:FULL CODE   Dispo: Anticipated discharge pending further diuresis - likely 2-3 days.     [name], MD 07/13/2019, 6:38 AM Pager: [phone]

## 2019-07-13 NOTE — Care Management (Signed)
Per Mr. Theophilus Walz he has no insurance.

## 2019-07-14 ENCOUNTER — Inpatient Hospital Stay (HOSPITAL_COMMUNITY): Payer: Medicare Other

## 2019-07-14 LAB — BASIC METABOLIC PANEL
Anion gap: 11 (ref 5–15)
BUN: 30 mg/dL — ABNORMAL HIGH (ref 8–23)
CO2: 27 mmol/L (ref 22–32)
Calcium: 8.6 mg/dL — ABNORMAL LOW (ref 8.9–10.3)
Chloride: 97 mmol/L — ABNORMAL LOW (ref 98–111)
Creatinine, Ser: 1.15 mg/dL (ref 0.61–1.24)
GFR calc Af Amer: >60 mL/min (ref >60)
GFR calc non Af Amer: 59 mL/min — ABNORMAL LOW (ref >60)
Glucose, Bld: 123 mg/dL — ABNORMAL HIGH (ref 70–99)
Potassium: 4.2 mmol/L (ref 3.5–5.1)
Sodium: 135 mmol/L (ref 135–145)

## 2019-07-14 LAB — PH, BODY FLUID: pH, Body Fluid: 7.8

## 2019-07-14 MED ORDER — BISACODYL 5 MG PO TBEC: 5.0000 mg | DELAYED_RELEASE_TABLET | Freq: Every day | ORAL | Status: DC | PRN

## 2019-07-14 MED ORDER — MAGNESIUM HYDROXIDE 400 MG/5ML PO SUSP
15.0000 mL | Freq: Every day | ORAL | Status: DC | PRN
  Administered 2019-07-14 – 2019-07-15 (×2): 15 mL via ORAL
  Filled 2019-07-14 (×2): qty 30

## 2019-07-14 NOTE — Progress Notes (Signed)
Pt complaining of constipation. Requesting milk of mag and dulcolax - MD paged with above request. Waiting return call.

## 2019-07-14 NOTE — Progress Notes (Addendum)
   Subjective: Patient doing well this morning but continues to have a cough. His breathing is "so so." He is wondering about his fluid intake. He states that he has been told to drink plenty of by nursing one day then told to limit his fluid the next day. He is confused about what to do. We discussed limiting his fluid intake, continuing to diurese and possibly getting another CXR. All questions and concerns addressed.   Objective:  Vital signs in last 24 hours: Vitals:   07/13/19 2003 07/13/19 2100 07/13/19 2150 07/14/19 0535  BP:  119/81  (!) 106/47  Pulse: 80  90 62  Resp:  20  18  Temp:  97.7 F (36.5 C)  97.8 F (36.6 C)  TempSrc:  Oral  Oral  SpO2: 96%   100%  Weight:    94 kg  Height:       Physical Exam Constitutional:      General: He is not in acute distress.    Appearance: He is not ill-appearing.  Cardiovascular:     Rate and Rhythm: Normal rate. Rhythm irregular.  Pulmonary:     Effort: Pulmonary effort is normal. No respiratory distress.     Comments: Bilateral crackles at the bases R > L. On 3L [place]. Musculoskeletal:     Comments: Bilateral lower extremity pitting edema, unchanged from yesterday.  Neurological:     Mental Status: He is alert.    Assessment/Plan:  Active Problems:   Atrial fibrillation with rapid ventricular response (HCC)   Pleural effusion, right   Acute heart failure with preserved ejection fraction [facility])  Alfred Mercado is an 42 year oldMwith no recorded prior medical history who presented to the hospital with a 1 week history of shortness of breathand found to be in A fib with RVR with bilateral pleural effusions.  New onset diastolic heart failure - ALPF79/02 with LVEF 50-55% -1.1L urine output yesterday with -45cc net balance - question accuracy give wt up 5.5kg?  - repeat CXR today for increased oxygen requirement and worsening crackles on examination  - Cr improved today 1.15 < 1.27 < 1.03, will continue to monitor -  continuefurosemide 80mg  IV BID - continue daily wts, strict I/Os, with continuous pulse oximetry -wean oxygen as tolerated - will need outpatient follow-up with PCP and cardiology given new HF and afib diagnoses    New OnsetA fib with RVR CHADS2-VASc of 3, with new CHF. HR well controlled today. - continue PO diltiazem to 90mg  PO BID  - continue metoprolol 12.5mg  PO BID - apixaban 5mg  BID for anticoagulation - continuous cardiac monitoring   Tobacco Use Disorder Pt has a 40 pack year smoking history. - will give nicotine patch 21mg  transdermal daily   Diet: low Na diet, 1818mL fluid restriction Fluids:none VTE IOX:[id number] 5mg  BID CODE STATUS:FULL CODE   Dispo: Anticipated dischargepending optimization of his diuresis    [name], MD 07/14/2019, 7:22 AM Pager: [phone]

## 2019-07-15 DIAGNOSIS — Z79899 Other long term (current) drug therapy: Secondary | ICD-10-CM

## 2019-07-15 DIAGNOSIS — Z720 Tobacco use: Secondary | ICD-10-CM

## 2019-07-15 DIAGNOSIS — Z9981 Dependence on supplemental oxygen: Secondary | ICD-10-CM

## 2019-07-15 LAB — BASIC METABOLIC PANEL
Anion gap: 11 (ref 5–15)
BUN: 26 mg/dL — ABNORMAL HIGH (ref 8–23)
CO2: 29 mmol/L (ref 22–32)
Calcium: 8.5 mg/dL — ABNORMAL LOW (ref 8.9–10.3)
Chloride: 96 mmol/L — ABNORMAL LOW (ref 98–111)
Creatinine, Ser: 1.04 mg/dL (ref 0.61–1.24)
GFR calc Af Amer: >60 mL/min (ref >60)
GFR calc non Af Amer: >60 mL/min (ref >60)
Glucose, Bld: 104 mg/dL — ABNORMAL HIGH (ref 70–99)
Potassium: 4.2 mmol/L (ref 3.5–5.1)
Sodium: 136 mmol/L (ref 135–145)

## 2019-07-15 MED ORDER — DILTIAZEM HCL ER 90 MG PO CP12: 90.0000 mg | ORAL_CAPSULE | Freq: Two times a day (BID) | ORAL | 1 refills | Status: DC

## 2019-07-15 MED ORDER — FUROSEMIDE 80 MG PO TABS: 80.0000 mg | ORAL_TABLET | Freq: Two times a day (BID) | ORAL | 1 refills | Status: DC

## 2019-07-15 MED ORDER — FUROSEMIDE 80 MG PO TABS
80.0000 mg | ORAL_TABLET | Freq: Two times a day (BID) | ORAL | Status: DC
  Administered 2019-07-15 (×2): 80 mg via ORAL
  Filled 2019-07-15: qty 1

## 2019-07-15 MED ORDER — APIXABAN 5 MG PO TABS: 5.0000 mg | ORAL_TABLET | Freq: Two times a day (BID) | ORAL | 1 refills | Status: DC

## 2019-07-15 MED ORDER — METOPROLOL SUCCINATE ER 25 MG PO TB24: 25.0000 mg | ORAL_TABLET | Freq: Every day | ORAL | 1 refills | Status: DC

## 2019-07-15 MED ORDER — POTASSIUM CHLORIDE CRYS ER 20 MEQ PO TBCR: 20.0000 meq | EXTENDED_RELEASE_TABLET | Freq: Every day | ORAL | 0 refills | Status: DC

## 2019-07-15 MED ORDER — DILTIAZEM HCL ER COATED BEADS 180 MG PO CP24: 180.0000 mg | ORAL_CAPSULE | Freq: Every day | ORAL | 1 refills | Status: DC

## 2019-07-15 NOTE — Discharge Summary (Signed)
Name: Alfred Mercado MRN: [id number] DOB: [birth year] 83 y.o. PCP: Patient, No Pcp Per  Date of Admission: 07/11/2019  8:40 AM Date of Discharge: 07/15/2019 Attending Physician: [name], MD  Discharge Diagnosis: 1. New HFpEF exacerbation 2. New A fib with RVR  Discharge Medications: Allergies as of 07/15/2019   No Known Allergies     Medication List    STOP taking these medications   PRIMATENE ASTHMA PO     TAKE these medications   acetaminophen 325 MG tablet Commonly known as: TYLENOL Take 650 mg by mouth every 6 (six) hours as needed for mild pain or headache.   apixaban 5 MG Tabs tablet Commonly known as: ELIQUIS Take 1 tablet (5 mg total) by mouth 2 (two) times daily.   diltiazem 180 MG 24 hr capsule Commonly known as: Cardizem CD Take 1 capsule (180 mg total) by mouth daily.   furosemide 80 MG tablet Commonly known as: LASIX Take 1 tablet (80 mg total) by mouth 2 (two) times daily.   ibuprofen 200 MG tablet Commonly known as: ADVIL Take 400 mg by mouth every 6 (six) hours as needed for headache or mild pain.   metoprolol succinate 25 MG 24 hr tablet Commonly known as: Toprol XL Take 1 tablet (25 mg total) by mouth daily.   potassium chloride SA 20 MEQ tablet Commonly known as: KLOR-CON Take 1 tablet (20 mEq total) by mouth daily for 14 days. Start taking on: July 16, 2019       Disposition and follow-up:   Alfred Mercado was discharged from [facility] in Stable condition.  At the hospital follow up visit please address:  1.  Pt has not seen a primary doctor in over 20 years. Will need to establish with PCP and get an appointment with financial counseling for insurance options. Hopefully, he can enroll soon for coverage with a Medicare Part D plan to begin in January to help offset his new medication costs.  New HFpEF - ensure pt is taking his medication appropriately and weight remains stable - discharged with  metoprolol  daily and furosemide  BID - pt will need a referral to cardiology for ischemic evaluation  A fib with RVR - pt presented with new, likely persistent/chronic, a fib - discharged on diltiazem  daily, metoprolol  daily, and apixaban  BID  - ensure that pt is taking his medication appropriately and has access/is able to afford them   2.  Labs / imaging needed at time of follow-up: NONE  3.  Pending labs/ test needing follow-up: NONE  Follow-up Appointments: Follow-up Information    [facility]. Go on 07/22/2019.   Why: :15am  Hospital follow-up appointment Contact information: [address] [place] [address] [phone]       [facility] Follow up.   Contact information: [address] [place] [address] [phone]          Hospital Course by problem list: 1. Alfred Mercado is an 83 year old M with no known past medical history (as he has not seen a physician in 20 years) who presented with 1 week of SOB. On arrival to the emergency room, he was found to be in atrial fibrillation with RVR with ventricular rate of 140-170. He was hemodynamically stable with BP 126/59 and saturating 97% on 2L [place]. Lab work up largely unremarkable with negative troponin and normal CBC. BMP significant for bicarb  21, Glc 129, and Ca 8.5. BNP was elevated to 300. CXR showed findings concerning for cardiogenic pulmonary edema with increased vascular congestion and cardiomegaly. CTA was negative for PE but revealed a moderate right sided pleural effusion and small left pleural effusion. Echo was signficiant for LVEF of 50-55% with moderate LVH, and the diastolic function could not be evaluated secondary to a fib. Pt was initially started on a diltiazem drip and transitioned to PO diltiazem on hospital day 2. That day, pt went for a R sided thoracentesis where they removed 1.5L of  clear yellow fluid. Fluid analysis was consistent with a transudative effusion. On hospital day 3, pt was still in a fib with uncontrolled rates so metoprolol was added. [name] CHADS-VASc Score was 3 due to age and new CHF. He was started on Eliquis 5mg  BID during his admission. Pt continued diuresis and rate controlling medication titration during his hospitalization. By hospital day 4, pt did not demonstrate a need for continued supplemental oxygen. He was net 6L out with a weight of 91.6kg upon discharge. He was given diltiazem 180mg  daily, metoprolol 25mg  daily, furosemide 80mg  BID, and apixaban 5mg  BID at discharge and instructed to establish care with a PCP for continued follow-up and referral to cardiology for eventual ischemic evaluation.    Discharge Vitals:   BP 108/71 (BP Location: Right Arm)    Pulse 81    Temp 97.6 F (36.4 C) (Oral)    Resp 16    Ht 6\' 2"  (1.88 m)    Wt 91.6 kg    SpO2 92%    BMI 25.92 kg/m   Pertinent Labs, Studies, and Procedures:  CBC Latest Ref Rng & Units 07/12/2019 07/11/2019  WBC 4.0 - 10.5 K/uL 7.2 7.3  Hemoglobin 13.0 - 17.0 g/dL   Hematocrit - 52.0 % 41.5 43.5  Platelets 150 - 400 K/uL 213 214   BMP Latest Ref Rng & Units 07/15/2019 07/14/2019 07/13/2019  Glucose 70 - 99 mg/dL 09.6) 28.3) 66.2)  BUN 8 - 23 mg/dL 07/17/2019) 07/16/2019) 07/15/2019)  Creatinine 0.61 - 1.24 mg/dL 947(M 546(T 035(W)  Sodium 135 - 145 mmol/L 136 135 133(L)  Potassium 3.5 - 5.1 mmol/L 4.2 4.2 4.2  Chloride 98 - 111 mmol/L 96(L) 97(L) 93(L)  CO2 22 - 32 mmol/L 29 27 29   Calcium 8.9 - 10.3 mg/dL 65(K) 81(E) 75(T)   BNP (last 3 results) Recent Labs    07/11/19 0855  BNP 300.3*   Lab Results  Component Value Date   TSH 0.747 07/11/2019   Lab Results  Component Value Date   HGBA1C 5.8 (H) 07/11/2019   Recent Results (from the past 240 hour(s))  SARS CORONAVIRUS 2 (TAT 6-24 HRS) Nasopharyngeal Nasopharyngeal Swab     Status: None   Collection Time: 07/11/19  8:58  AM   Specimen: Nasopharyngeal Swab  Result Value Ref Range Status   SARS Coronavirus 2 NEGATIVE NEGATIVE Final    Comment: (NOTE) SARS-CoV-2 target nucleic acids are NOT DETECTED. The SARS-CoV-2 RNA is generally detectable in upper and lower respiratory specimens during the acute phase of infection. Negative results do not preclude SARS-CoV-2 infection, do not rule out co-infections with other pathogens, and should not be used as the sole basis for treatment or other patient management decisions. Negative results must be combined with clinical observations, patient history, and epidemiological information. The expected result is Negative. Fact Sheet for Patients: 6.7(R Fact Sheet for Healthcare Providers: 9.1(M This test is not yet approved  or cleared by the [place] and  has been authorized for detection and/or diagnosis of SARS-CoV-2 by FDA under an Emergency Use Authorization (EUA). This EUA will remain  in effect (meaning this test can be used) for the duration of the COVID-19 declaration under Section 56 4(b)(1) of the Act, 21 U.S.C. section 360bbb-3(b)(1), unless the authorization is terminated or revoked sooner. Performed at [facility], [address] [place], [place] [address]   Gram stain     Status: None   Collection Time: 07/12/19  9:21 AM   Specimen: PATH Cytology Pleural fluid  Result Value Ref Range Status   Specimen Description PLEURAL RIGHT  Final   Special Requests NONE  Final   Gram Stain   Final    RARE WBC PRESENT,BOTH PMN AND MONONUCLEAR NO ORGANISMS SEEN Performed at [facility], [address], [place], [place] [address]    Report Status 07/13/2019 FINAL  Final  Culture, body fluid-bottle     Status: None   Collection Time: 07/12/19  9:21 AM   Specimen: Pleura  Result Value Ref Range Status   Specimen Description PLEURAL RIGHT  Final   Special Requests  NONE  Final   Culture   Final    NO GROWTH 5 DAYS Performed at [facility], [address], [place], [place] [address]    Report Status 07/17/2019 FINAL  Final    ECHO 07/11/2019 IMPRESSIONS  1. Left ventricular ejection fraction, by visual estimation, is 50 to 55%. The left ventricle has low normal function. There is moderately increased left ventricular hypertrophy.  2. Left ventricular diastolic function could not be evaluated.  3. Global right ventricle has normal systolic function.The right ventricular size is normal. No increase in right ventricular wall thickness.  4. Left atrial size was mildly dilated.  5. Right atrial size was normal.  6. The mitral valve is grossly normal. Trace mitral valve regurgitation.  7. The tricuspid valve is grossly normal. Tricuspid valve regurgitation is trivial.  8. The aortic valve is tricuspid. Aortic valve regurgitation is not visualized. Mild aortic valve sclerosis without stenosis.  9. The pulmonic valve was not well visualized. Pulmonic valve regurgitation is not visualized. 10. The aortic root was not well visualized. 11. Mildly elevated pulmonary artery systolic pressure. 12. The interatrial septum was not well visualized.   CXR 07/11/2019 CLINICAL DATA:  Cough and shortness of breath the past week. Atrial fibrillation.  EXAM: PORTABLE CHEST 1 VIEW  COMPARISON:  None.  FINDINGS: Enlarged cardiac silhouette. Prominent pulmonary vasculature and interstitial markings, with Kerley lines. Moderately large right pleural effusion and probable small left pleural effusion. Mild scoliosis.  IMPRESSION: Cardiomegaly and acute congestive heart failure  CTA 07/11/2019 CLINICAL DATA:  Shortness of breath.  EXAM: CT ANGIOGRAPHY CHEST WITH CONTRAST  TECHNIQUE: Multidetector CT imaging of the chest was performed using the standard protocol during bolus administration of intravenous contrast. Multiplanar CT image  reconstructions and MIPs were obtained to evaluate the vascular anatomy.  CONTRAST:  75mL OMNIPAQUE IOHEXOL 350 MG/ML SOLN  COMPARISON:  Chest x-ray earlier today.  FINDINGS: Cardiovascular: The pulmonary arteries are well opacified. There is no evidence of pulmonary embolism. Central pulmonary arteries are normal in caliber. The thoracic aorta is normal in caliber. The heart is top-normal in size. Extensive calcified coronary artery plaque is noted in a 3 vessel distribution and including the left main coronary artery. No pericardial fluid identified.  Mediastinum/Nodes: No enlarged mediastinal, hilar, or axillary  lymph nodes. Thyroid gland, trachea, and esophagus demonstrate no significant findings.  Lungs/Pleura: Moderate right pleural effusion and small left pleural effusion present. Associated bilateral lower lobe atelectasis, right greater than left. Aerated lungs demonstrate at least pulmonary venous hypertension and potentially component of pulmonary interstitial edema. No pneumothorax or focal masses.  Upper Abdomen: Left lobe hepatic cyst has a benign appearance and measures 2.5 cm.  Musculoskeletal: No chest wall abnormality. No acute or significant osseous findings.  Review of the MIP images confirms the above findings.  IMPRESSION: 1. No evidence of pulmonary embolism. 2. Moderate right pleural effusion and small left pleural effusion with associated bilateral lower lobe atelectasis, right greater than left. 3. Probable pulmonary venous hypertension and interstitial edema. 4. Coronary atherosclerosis with calcified coronary artery plaque noted in a 3 vessel distribution and including the left main coronary artery.  CXR 07/12/2019 CLINICAL DATA:  Shortness of breath.  EXAM: CHEST - 2 VIEW  COMPARISON:  07/11/2019; chest CT-07/11/2019  FINDINGS: Grossly unchanged enlarged cardiac silhouette and mediastinal contours with partial obscuration  of the right heart border secondary to right-sided pleural effusion and associated right basilar opacities. Unchanged small left-sided effusion with associated left basilar opacities. No new focal airspace opacities. The pulmonary vasculature remains indistinct with cephalization of flow. No pneumothorax. No acute osseous abnormalities.  IMPRESSION: Similar findings of cardiomegaly, pulmonary edema small to moderate sized bilateral effusions, right greater than left, and associated bibasilar opacities, atelectasis versus infiltrate.  CXR 07/12/2019 CLINICAL DATA:  Status post right thoracentesis.  EXAM: CHEST  1 VIEW  COMPARISON:  Earlier today  FINDINGS: Stable cardiac enlargement. Decreased volume of right pleural effusion status post thoracentesis. No pneumothorax. Small left pleural effusion is unchanged. Persistent retrocardiac opacity in the left base.  IMPRESSION: 1. No pneumothorax status post right thoracentesis.  CXR 07/14/2019 CLINICAL DATA:  Shortness of breath, cough, wheezing, history of thoracentesis  EXAM: CHEST - 2 VIEW  COMPARISON:  07/12/2019  FINDINGS: Cardiomegaly. Small, right greater than left layering bilateral pleural effusions, similar in appearance to prior radiographs. Mild, diffuse interstitial pulmonary opacity. The visualized skeletal structures are unremarkable.  IMPRESSION: 1.  Cardiomegaly.  2. Small, right greater than left layering bilateral pleural effusions, similar in appearance to prior radiographs.  3.  Mild, diffuse interstitial pulmonary opacity, likely edema.    Discharge Instructions: Discharge Instructions    Amb referral to AFIB Clinic   Complete by: As directed    Diet - low sodium heart healthy   Complete by: As directed    Discharge instructions   Complete by: As directed    Alfred Mercado were seen in the hospital for trouble breathing after fluid accumulated on your lungs and in your  body. This occurred because your heartbeat was in an abnormal rhythm, called atrial fibrillation or A Fib, that didn't allow the heart to pump as well. You were treated with medications to correct your heart rhythm and remove the fluid, in addition to having the excess fluid from the right lung drained through a needle.   It will be important that you begin taking 4 medications at home. Take furosemide 80mg  twice a day as a water pill to continue to help remove fluid off your body. Take diltiazem 90mg  twice a day and Toprol 25mg  daily to keep control of your heart rates. Take apixaban 5mg  twice a day to prevent blood clots and reduce your risk of having a stroke due to the A Fib.  Try to weight yourself at  home each day at the same time of day and on the same scale. If your weight increased by 3lbs in one day or 7 lbs in one week, please reach out to your primary doctor.  [facility] for a hospital follow-up on Nov 25th at 10:15am. You will need to establish with a primary care doctor to continue management your medications and heart problems. They will also likely refer you to a cardiologist (or heart doctor) for continued follow-up.  Thank you for letting us be a part of your care!   Increase activity slowly   Complete by: As directed       Signed: Thom ChimesJones, Celest Reitz, MD 07/15/2019, 3:44 PM   Pager: [phone]

## 2019-07-15 NOTE — Clinical Social Work Note (Signed)
Follow up appointment has been made.   [place], Alfred Mercado

## 2019-07-15 NOTE — Plan of Care (Signed)

## 2019-07-15 NOTE — Care Management (Signed)
07-15-19 MATCH completed for medication assistance. No further needs from CM at this time. Bethena Roys, RN,BSN [phone]

## 2019-07-15 NOTE — Progress Notes (Signed)
   Subjective: Pt seen at the bedside this morning. States his breathing is much improved from his presentation. He expresses gratitude to the staff for helping him feel better. Is interested in going home today. No acute concerns at this time.  Objective:  Vital signs in last 24 hours: Vitals:   07/14/19 1528 07/14/19 2046 07/14/19 2133 07/15/19 0642  BP: (!) 102/52 (!) 122/54 126/89 (!) 109/51  Pulse: 74 66 81 (!) 52  Resp:  18  16  Temp: 98.2 F (36.8 C) (!) 97.4 F (36.3 C)  97.6 F (36.4 C)  TempSrc: Oral Oral    SpO2: 96% 98%  99%  Weight:      Height:       Physical Exam Vitals signs and nursing note reviewed.  Constitutional:      General: He is not in acute distress.    Appearance: He is not ill-appearing.  Cardiovascular:     Rate and Rhythm: Normal rate. Rhythm irregular.  Pulmonary:     Effort: Pulmonary effort is normal.     Comments: 3L [place] weaned to RA in the room. O2 sat with good wave form at >95. Scattered crackles - much improved from yesterday. Musculoskeletal:     Comments: Bilateral lower extremity pitting edema continuing to improve  Neurological:     Mental Status: He is alert.    Assessment/Plan:  Active Problems:   Atrial fibrillation with rapid ventricular response (HCC)   Pleural effusion, right   Acute heart failure with preserved ejection fraction [facility])   Alfred Mercado is an 50 year oldMwith no recorded prior medical history who presented to the hospital with a 1 week history of shortness of breathand found to be in A fib with RVR with bilateral pleural effusions.  New onset diastolic heart failure [id number] LVEF 50-55%. -3.4L urine output yesterday with-2.5L net balance, wt down 2.4kg - repeat CXR yesterday with persistent edema and bilateral pleural effusions - Cr improving 1.04 << 1.15 < 1.27, will continue to monitor - switch to furosemide 80mg  PO BID -continuedaily wts,strict I/Os  -ambulate pt today with pulse  oximetry  - will need outpatient follow-up with PCP and cardiology given new HF and afib diagnoses - case management consulted for medication and PCP assistance    New OnsetA fib with RVR CHADS2-VASc of3, with new CHF. Heart rates controlled. -continuePO diltiazemto 90mg  PO BID  - continue metoprolol 12.5mg  PO BID - apixaban 5mg  BID for anticoagulation - continuous cardiac monitoring   Tobacco Use Disorder Pt has a 40 pack year smoking history. - will give nicotine patch 21mg  transdermal daily   Diet: low Na diet, 1874mL fluid restriction Fluids:none VTE GGY:IRSWNIOE 5mg  BID CODE STATUS:FULL CODE   Dispo: Anticipated discharge?today pendingambulation. Cytology from thoracentesis on 11/15 showing reactive mesothelial cells. Can be present in normal pleural fluid, but pt should keep follow-up with a PCP and consider repeat CT after resolution of HF exacerbation.   [name], MD 07/15/2019, 6:47 AM Pager: [phone]

## 2019-07-15 NOTE — Progress Notes (Signed)
Patient instructed on discharge papers. Questions answered. IV taken out. Patient taken by wheelchair to [place] parking and picked up by son.

## 2019-07-15 NOTE — Progress Notes (Signed)
  Mobility Specialist Criteria Algorithm Info.  SATURATION QUALIFICATIONS: (This note is used to comply with regulatory documentation for home oxygen)  Patient Saturations on Room Air at Rest = 96%  Patient Saturations on Room Air while Ambulating = 92%  Patient Saturations on N/A Liters of oxygen while Ambulating = N/A%  Please briefly explain why patient needs home oxygen:   Mobility:  HOB elevated:Self regulated Activity: Ambulated in hall;Dangled on edge of bed Range of motion: Active;All extremities Level of assistance: Standby assist, set-up cues, supervision of patient - no hands on Assistive device: Front wheel walker (uses cane at home) Minutes stood: 5 minutes Minutes ambulated: 5 minutes Distance ambulated (ft): 240 ft Mobility response: Tolerated well Bed Position: Semi-fowlers Mobility Status: Yes, no lift needed   07/15/2019 1:45 PM

## 2019-07-15 NOTE — Evaluation (Signed)
Physical Therapy Evaluation Patient Details Name: Alfred Mercado MRN: [id number] DOB: Aug 15, 1936 Today's Date: 07/15/2019   History of Present Illness  Pt is an 83 y/o male admitted secondary to SOB and found to have new onset a-fib with RVR and heart failure. No pertinent PMH in file.    Clinical Impression  Pt presented supine in bed with HOB elevated, awake and willing to participate in therapy session. Prior to admission, pt reported that he ambulated with a cane PRN and was independent with ADLs. Pt lives alone but has a "friend" that comes to check on him daily. At the time of evaluation, pt ambulated in hallway without use of an AD with min guard for safety. Pt on RA throughout with SPO2 fluctuating between 88% and 94%, with quick recovery to >90% with instruction in pursed-lip breathing. PT will continue to follow pt acutely to progress mobility as tolerated per PT POC.    Follow Up Recommendations No PT follow up    Equipment Recommendations  None recommended by PT    Recommendations for Other Services       Precautions / Restrictions Precautions Precautions: Fall Precaution Comments: monitor SPO2 Restrictions Weight Bearing Restrictions: No      Mobility  Bed Mobility Overal bed mobility: Needs Assistance Bed Mobility: Supine to Sit     Supine to sit: Supervision     General bed mobility comments: for safety  Transfers Overall transfer level: Needs assistance Equipment used: None Transfers: Sit to/from Stand Sit to Stand: Min guard         General transfer comment: for safety with transitional movement into standing from sitting EOB  Ambulation/Gait Ambulation/Gait assistance: Min guard Gait Distance (Feet): 50 Feet Assistive device: None Gait Pattern/deviations: Step-through pattern;Decreased stride length Gait velocity: decreased   General Gait Details: pt with mild instability but no overt LOB or need for physical assistance, min guard for  safety  Stairs            Wheelchair Mobility    Modified Rankin (Stroke Patients Only)       Balance Overall balance assessment: Needs assistance Sitting-balance support: Feet supported Sitting balance-[name]: Good     Standing balance support: During functional activity;No upper extremity supported Standing balance-[name]: Fair                               Pertinent Vitals/Pain Pain Assessment: No/denies pain    Home Living Family/patient expects to be discharged to:: Private residence Living Arrangements: Alone Available Help at Discharge: Friend(s);Available PRN/intermittently   Home Access: Stairs to enter   Entrance Stairs-Number of Steps: 1 Home Layout: One level Home Equipment: Cane - single point      Prior Function Level of Independence: Independent with assistive device(s)         Comments: ambulates with use of a cane PRN     Hand Dominance        Extremity/Trunk Assessment   Upper Extremity Assessment Upper Extremity Assessment: Overall WFL for tasks assessed    Lower Extremity Assessment Lower Extremity Assessment: Overall WFL for tasks assessed    Cervical / Trunk Assessment Cervical / Trunk Assessment: Kyphotic  Communication   Communication: No difficulties  Cognition Arousal/Alertness: Awake/alert Behavior During Therapy: WFL for tasks assessed/performed Overall Cognitive Status: Within Functional Limits for tasks assessed  General Comments      Exercises     Assessment/Plan    PT Assessment Patient needs continued PT services  PT Problem List Decreased balance;Decreased activity tolerance;Decreased mobility;Decreased coordination;Decreased knowledge of use of DME;Decreased safety awareness;Decreased knowledge of precautions       PT Treatment Interventions DME instruction;Gait training;Stair training;Functional mobility  training;Therapeutic exercise;Therapeutic activities;Balance training;Neuromuscular re-education;Patient/family education    PT Goals (Current goals can be found in the Care Plan section)  Acute Rehab PT Goals Patient Stated Goal: "home today" PT Goal Formulation: With patient Time For Goal Achievement: 07/29/19 Potential to Achieve Goals: Good    Frequency Min 3X/week   Barriers to discharge        Co-evaluation               AM-PAC PT "6 Clicks" Mobility  Outcome Measure Help needed turning from your back to your side while in a flat bed without using bedrails?: None Help needed moving from lying on your back to sitting on the side of a flat bed without using bedrails?: None Help needed moving to and from a bed to a chair (including a wheelchair)?: None Help needed standing up from a chair using your arms (e.g., wheelchair or bedside chair)?: None Help needed to walk in hospital room?: A Little Help needed climbing 3-5 steps with a railing? : A Little 6 Click Score: 22    End of Session Equipment Utilized During Treatment: Gait belt Activity Tolerance: Patient tolerated treatment well Patient left: in bed;with call bell/phone within reach;Other (comment)(sitting EOB) Nurse Communication: Mobility status PT Visit Diagnosis: Other abnormalities of gait and mobility (R26.89)    Time: 1431-1446 PT Time Calculation (min) (ACUTE ONLY): 15 min   Charges:   PT Evaluation $PT Eval Moderate Complexity: 1 Mod          Ginette Pitman, PT, DPT  Acute Rehabilitation Services Pager [phone] Office [phone]    [name] 07/15/2019, 4:30 PM

## 2019-07-17 ENCOUNTER — Encounter (HOSPITAL_COMMUNITY): Payer: Self-pay | Admitting: Physician Assistant

## 2019-07-17 LAB — CULTURE, BODY FLUID W GRAM STAIN -BOTTLE: Culture: NO GROWTH

## 2019-07-22 ENCOUNTER — Ambulatory Visit: Payer: Self-pay

## 2019-10-14 ENCOUNTER — Inpatient Hospital Stay (HOSPITAL_COMMUNITY)
Admission: EM | Admit: 2019-10-14 | Discharge: 2019-10-18 | DRG: 308 | Disposition: A | Discharge status: Home / Self Care | Payer: Medicare Other | Attending: Internal Medicine | Admitting: Internal Medicine

## 2019-10-14 ENCOUNTER — Inpatient Hospital Stay (HOSPITAL_COMMUNITY): Payer: Medicare Other

## 2019-10-14 ENCOUNTER — Emergency Department (HOSPITAL_COMMUNITY): Payer: Medicare Other

## 2019-10-14 ENCOUNTER — Other Ambulatory Visit: Payer: Self-pay

## 2019-10-14 DIAGNOSIS — Z79899 Other long term (current) drug therapy: Secondary | ICD-10-CM

## 2019-10-14 DIAGNOSIS — I5033 Acute on chronic diastolic (congestive) heart failure: Secondary | ICD-10-CM | POA: Diagnosis present

## 2019-10-14 DIAGNOSIS — J918 Pleural effusion in other conditions classified elsewhere: Secondary | ICD-10-CM | POA: Diagnosis present

## 2019-10-14 DIAGNOSIS — R0603 Acute respiratory distress: Secondary | ICD-10-CM | POA: Diagnosis not present

## 2019-10-14 DIAGNOSIS — I5031 Acute diastolic (congestive) heart failure: Secondary | ICD-10-CM | POA: Diagnosis not present

## 2019-10-14 DIAGNOSIS — R0600 Dyspnea, unspecified: Secondary | ICD-10-CM | POA: Diagnosis present

## 2019-10-14 DIAGNOSIS — F17210 Nicotine dependence, cigarettes, uncomplicated: Secondary | ICD-10-CM | POA: Diagnosis present

## 2019-10-14 DIAGNOSIS — Z23 Encounter for immunization: Secondary | ICD-10-CM | POA: Diagnosis present

## 2019-10-14 DIAGNOSIS — I444 Left anterior fascicular block: Secondary | ICD-10-CM | POA: Diagnosis present

## 2019-10-14 DIAGNOSIS — I5032 Chronic diastolic (congestive) heart failure: Secondary | ICD-10-CM | POA: Diagnosis present

## 2019-10-14 DIAGNOSIS — J90 Pleural effusion, not elsewhere classified: Secondary | ICD-10-CM

## 2019-10-14 DIAGNOSIS — Z791 Long term (current) use of non-steroidal anti-inflammatories (NSAID): Secondary | ICD-10-CM

## 2019-10-14 DIAGNOSIS — R0602 Shortness of breath: Secondary | ICD-10-CM | POA: Diagnosis present

## 2019-10-14 DIAGNOSIS — J9601 Acute respiratory failure with hypoxia: Secondary | ICD-10-CM | POA: Diagnosis present

## 2019-10-14 DIAGNOSIS — I509 Heart failure, unspecified: Secondary | ICD-10-CM

## 2019-10-14 DIAGNOSIS — Z20822 Contact with and (suspected) exposure to covid-19: Secondary | ICD-10-CM | POA: Diagnosis present

## 2019-10-14 DIAGNOSIS — I4891 Unspecified atrial fibrillation: Principal | ICD-10-CM

## 2019-10-14 DIAGNOSIS — Z9889 Other specified postprocedural states: Secondary | ICD-10-CM

## 2019-10-14 LAB — CBG MONITORING, ED: Glucose-Capillary: 109 mg/dL — ABNORMAL HIGH (ref 70–99)

## 2019-10-14 LAB — SARS CORONAVIRUS 2 (TAT 6-24 HRS): SARS Coronavirus 2: NEGATIVE

## 2019-10-14 LAB — CBC WITH DIFFERENTIAL/PLATELET
Abs Immature Granulocytes: 0.07 10*3/uL (ref 0.00–0.07)
Basophils Absolute: 0 10*3/uL (ref 0.0–0.1)
Basophils Relative: 1 %
Eosinophils Absolute: 0.1 10*3/uL (ref 0.0–0.5)
Eosinophils Relative: 1 %
HCT: 39.1 % (ref 39.0–52.0)
Hemoglobin: 12.6 g/dL — ABNORMAL LOW (ref 13.0–17.0)
Immature Granulocytes: 1 %
Lymphocytes Relative: 14 %
Lymphs Abs: 1.1 10*3/uL (ref 0.7–4.0)
MCH: 30.2 pg (ref 26.0–34.0)
MCHC: 32.2 g/dL (ref 30.0–36.0)
MCV: 93.8 fL (ref 80.0–100.0)
Monocytes Absolute: 0.5 10*3/uL (ref 0.1–1.0)
Monocytes Relative: 6 %
Neutro Abs: 6.7 10*3/uL (ref 1.7–7.7)
Neutrophils Relative %: 77 %
Platelets: 245 10*3/uL (ref 150–400)
RBC: 4.17 MIL/uL — ABNORMAL LOW (ref 4.22–5.81)
RDW: 14.1 % (ref 11.5–15.5)
WBC: 8.4 10*3/uL (ref 4.0–10.5)
nRBC: 0 % (ref 0.0–0.2)

## 2019-10-14 LAB — COMPREHENSIVE METABOLIC PANEL
ALT: 29 U/L (ref 0–44)
AST: 26 U/L (ref 15–41)
Albumin: 3.4 g/dL — ABNORMAL LOW (ref 3.5–5.0)
Alkaline Phosphatase: 84 U/L (ref 38–126)
Anion gap: 11 (ref 5–15)
BUN: 12 mg/dL (ref 8–23)
CO2: 21 mmol/L — ABNORMAL LOW (ref 22–32)
Calcium: 8.8 mg/dL — ABNORMAL LOW (ref 8.9–10.3)
Chloride: 107 mmol/L (ref 98–111)
Creatinine, Ser: 1.13 mg/dL (ref 0.61–1.24)
GFR calc Af Amer: >60 mL/min (ref >60)
GFR calc non Af Amer: >60 mL/min (ref >60)
Glucose, Bld: 131 mg/dL — ABNORMAL HIGH (ref 70–99)
Potassium: 4 mmol/L (ref 3.5–5.1)
Sodium: 139 mmol/L (ref 135–145)
Total Bilirubin: 0.8 mg/dL (ref 0.3–1.2)
Total Protein: 5.9 g/dL — ABNORMAL LOW (ref 6.5–8.1)

## 2019-10-14 LAB — APTT: aPTT: 31 seconds (ref 24–36)

## 2019-10-14 LAB — PROTIME-INR
INR: 1.2 (ref 0.8–1.2)
Prothrombin Time: 14.8 seconds (ref 11.4–15.2)

## 2019-10-14 LAB — BRAIN NATRIURETIC PEPTIDE: B Natriuretic Peptide: 320.5 pg/mL — ABNORMAL HIGH (ref 0.0–100.0)

## 2019-10-14 MED ORDER — SODIUM CHLORIDE 0.9 % IV SOLN: INTRAVENOUS | Status: DC

## 2019-10-14 MED ORDER — FUROSEMIDE 10 MG/ML IJ SOLN: 40.0000 mg | Freq: Two times a day (BID) | INTRAMUSCULAR | Status: DC

## 2019-10-14 MED ORDER — FUROSEMIDE 10 MG/ML IJ SOLN
80.0000 mg | Freq: Once | INTRAMUSCULAR | Status: AC
  Administered 2019-10-14: 80 mg via INTRAVENOUS
  Filled 2019-10-14: qty 8

## 2019-10-14 MED ORDER — DILTIAZEM LOAD VIA INFUSION
15.0000 mg | Freq: Once | INTRAVENOUS | Status: AC
  Administered 2019-10-14: 14:00:00 15 mg via INTRAVENOUS
  Filled 2019-10-14: qty 15

## 2019-10-14 MED ORDER — INFLUENZA VAC A&B SA ADJ QUAD 0.5 ML IM PRSY
0.5000 mL | PREFILLED_SYRINGE | INTRAMUSCULAR | Status: AC
  Administered 2019-10-15: 08:00:00 0.5 mL via INTRAMUSCULAR
  Filled 2019-10-14: qty 0.5

## 2019-10-14 MED ORDER — ACETAMINOPHEN 650 MG RE SUPP: 650.0000 mg | Freq: Four times a day (QID) | RECTAL | Status: DC | PRN

## 2019-10-14 MED ORDER — WARFARIN - PHARMACIST DOSING INPATIENT: Freq: Every day | Status: DC

## 2019-10-14 MED ORDER — POLYETHYLENE GLYCOL 3350 17 G PO PACK
17.0000 g | PACK | Freq: Every day | ORAL | Status: DC | PRN
  Administered 2019-10-17: 17 g via ORAL
  Filled 2019-10-14: qty 1

## 2019-10-14 MED ORDER — METOPROLOL TARTRATE 25 MG PO TABS
25.0000 mg | ORAL_TABLET | Freq: Two times a day (BID) | ORAL | Status: DC
  Administered 2019-10-14 – 2019-10-18 (×8): 25 mg via ORAL
  Filled 2019-10-14 (×9): qty 1

## 2019-10-14 MED ORDER — WARFARIN SODIUM 5 MG PO TABS
5.0000 mg | ORAL_TABLET | Freq: Once | ORAL | Status: AC
  Administered 2019-10-14: 19:00:00 5 mg via ORAL
  Filled 2019-10-14: qty 1

## 2019-10-14 MED ORDER — DILTIAZEM HCL-DEXTROSE 125-5 MG/125ML-% IV SOLN (PREMIX)
5.0000 mg/h | INTRAVENOUS | Status: DC
  Administered 2019-10-14 – 2019-10-15 (×2): 5 mg/h via INTRAVENOUS
  Filled 2019-10-14 (×2): qty 125

## 2019-10-14 MED ORDER — ACETAMINOPHEN 325 MG PO TABS
650.0000 mg | ORAL_TABLET | Freq: Four times a day (QID) | ORAL | Status: DC | PRN
  Administered 2019-10-15 – 2019-10-16 (×2): 650 mg via ORAL
  Filled 2019-10-14 (×3): qty 2

## 2019-10-14 MED ORDER — FUROSEMIDE 10 MG/ML IJ SOLN
80.0000 mg | Freq: Two times a day (BID) | INTRAMUSCULAR | Status: DC
  Administered 2019-10-15 – 2019-10-16 (×3): 80 mg via INTRAVENOUS
  Filled 2019-10-14 (×3): qty 8

## 2019-10-14 MED ORDER — BISACODYL 5 MG PO TBEC: 5.0000 mg | DELAYED_RELEASE_TABLET | Freq: Every day | ORAL | Status: DC | PRN

## 2019-10-14 NOTE — ED Notes (Signed)
Pt transported to CT ?

## 2019-10-14 NOTE — ED Notes (Signed)
Patient urinated 

## 2019-10-14 NOTE — Progress Notes (Signed)
ANTICOAGULATION CONSULT NOTE - Initial Consult  Pharmacy Consult for warfarin Indication: atrial fibrillation  No Known Allergies  Patient Measurements: Height: 6\' 2"  (188 cm) Weight: 210 lb (95.3 kg) IBW/kg (Calculated) : 82.2 Heparin Dosing Weight:   Vital Signs: Temp: 97.5 F (36.4 C) (02/17 1333) Temp Source: Oral (02/17 1333) BP: 112/67 (02/17 1747) Pulse Rate: 63 (02/17 1747)  Labs: Recent Labs    10/14/19 1330  HGB 12.6*  HCT 39.1  PLT 245  APTT 31  LABPROT 14.8  INR 1.2  CREATININE 1.13    Estimated Creatinine Clearance: 58.6 mL/min (by C-G formula based on SCr of 1.13 mg/dL).   Medical History: No past medical history on file.  Medications:  (Not in a hospital admission)  Scheduled:  . furosemide  40 mg Intravenous Q12H  . metoprolol tartrate  25 mg Oral BID  . warfarin  5 mg Oral Once  . [START ON 10/15/2019] Warfarin - Pharmacist Dosing Inpatient   Does not apply q1800   Infusions:  . sodium chloride    . diltiazem (CARDIZEM) infusion 5 mg/hr (10/14/19 1359)    Assessment: 84 y/o male with complaints of shortness of breath, found to be in AF with RVR. He had Eliquis prescribed PTA, but has not taken this medication in at least 30 days. He may have trouble affording his medications. Pharmacy has been consulted to dose warfarin.   Hgb 12.6, Plt 245. Baseline INR 1.2. No bleeding noted at this time.   Goal of Therapy:  INR 2-3 Monitor platelets by anticoagulation protocol: Yes   Plan:  - Warfarin 5 mg PO x1 this evening - Monitor INR daily, s/sx of bleeding - F/u need for heparin IV bridge while INR subtherapeutic  97, PharmD PGY1 Pharmacy Resident

## 2019-10-14 NOTE — ED Triage Notes (Signed)
Pt arrives c/o shortness of breath x2 days, describes as difficulty catching breath rather than difficulty breathing. Has hx of CHF, daily smoker, unable to afford medications. Hx of afib, on afib on monitor 140's-170s. No known COVID exposure or other covid symptoms. Vitals for [facility]: 96% RA 100% on 2L o2, 136/108, temp 97

## 2019-10-14 NOTE — ED Provider Notes (Signed)
[facility] EMERGENCY DEPARTMENT Provider Note   CSN: [id number] Arrival date & time: 10/14/19  1314     History Chief Complaint  Patient presents with  . Shortness of Breath    Alfred Mercado is a 84 y.o. male.  84 year old male with history of CHF and A. fib with RVR who presents with increasing dyspnea on exertion times several days.  Patient states has been off his medications due to financial reasons for over a week.  Denies any associated chest pain or chest pressure.  Has had nonproductive cough without fever or chills.  He denies any palpitations.  Symptoms are better with rest and he does endorse some orthopnea.  EMS was called and patient's pulse oximetry was 96% on room air and patient was found to be in A. fib with RVR at a rate between 140 and 170 and transported here.        No past medical history on file.  Patient Active Problem List   Diagnosis Date Noted  . Pleural effusion, right   . Acute heart failure with preserved ejection fraction (HCC)   . Atrial fibrillation with rapid ventricular response (HCC) 07/11/2019         No family history on file.  Social History   Tobacco Use  . Smoking status: Not on file  Substance Use Topics  . Alcohol use: Not on file  . Drug use: Not on file    Home Medications Prior to Admission medications   Medication Sig Start Date End Date Taking? Authorizing Provider  acetaminophen (TYLENOL) 325 MG tablet Take 650 mg by mouth every 6 (six) hours as needed for mild pain or headache.    [provider]  apixaban (ELIQUIS) 5 MG TABS tablet Take 1 tablet (5 mg total) by mouth 2 (two) times daily. 07/15/19   Thom Chimes, MD  diltiazem (CARDIZEM CD) 180 MG 24 hr capsule Take 1 capsule (180 mg total) by mouth daily. 07/15/19 09/13/19  Thom Chimes, MD  furosemide (LASIX) 80 MG tablet Take 1 tablet (80 mg total) by mouth 2 (two) times daily. 07/15/19   Thom Chimes, MD  ibuprofen (ADVIL) 200 MG  tablet Take 400 mg by mouth every 6 (six) hours as needed for headache or mild pain.    [provider]  metoprolol succinate (TOPROL XL) 25 MG 24 hr tablet Take 1 tablet (25 mg total) by mouth daily. 07/15/19 09/13/19  Thom Chimes, MD  potassium chloride SA (KLOR-CON) 20 MEQ tablet Take 1 tablet (20 mEq total) by mouth daily for 14 days. 07/16/19 07/30/19  Thom Chimes, MD    Allergies    Patient has no known allergies.  Review of Systems   Review of Systems  All other systems reviewed and are negative.   Physical Exam Updated Vital Signs Ht 1.88 m (6\' 2" )   Wt 95.3 kg   BMI 26.96 kg/m   Physical Exam Vitals and nursing note reviewed.  Constitutional:      General: He is not in acute distress.    Appearance: Normal appearance. He is well-developed. He is not toxic-appearing.  HENT:     Head: Normocephalic and atraumatic.  Eyes:     General: Lids are normal.     Conjunctiva/sclera: Conjunctivae normal.     Pupils: Pupils are equal, round, and reactive to light.  Neck:     Thyroid: No thyroid mass.     Trachea: No tracheal deviation.  Cardiovascular:     Rate  and Rhythm: Tachycardia present. Rhythm irregular.     Heart sounds: Normal heart sounds. No murmur. No gallop.   Pulmonary:     Effort: Tachypnea present. No respiratory distress.     Breath sounds: No stridor. Decreased breath sounds present. No wheezing, rhonchi or rales.  Abdominal:     General: Bowel sounds are normal. There is no distension.     Palpations: Abdomen is soft.     Tenderness: There is no abdominal tenderness. There is no rebound.  Musculoskeletal:        General: No tenderness. Normal range of motion.     Cervical back: Normal range of motion and neck supple.  Skin:    General: Skin is warm and dry.     Findings: No abrasion or rash.  Neurological:     Mental Status: He is alert and oriented to person, place, and time.     GCS: GCS eye subscore is 4. GCS verbal subscore is 5. GCS  motor subscore is 6.     Cranial Nerves: No cranial nerve deficit.     Sensory: No sensory deficit.  Psychiatric:        Speech: Speech normal.        Behavior: Behavior normal.     ED Results / Procedures / Treatments   Labs (all labs ordered are listed, but only abnormal results are displayed) Labs Reviewed  [place]  APTT  CBC WITH DIFFERENTIAL/PLATELET  COMPREHENSIVE METABOLIC PANEL    EKG EKG Interpretation  Date/Time:  Wednesday October 14 2019 13:20:18 EST Ventricular Rate:  140 PR Interval:    QRS Duration: 94 QT Interval:  336 QTC Calculation: 513 R Axis:   -52 Text Interpretation: Atrial fibrillation Left anterior fascicular block ST depression, probably rate related Prolonged QT interval Confirmed by Lacretia Leigh ([id number]) on 10/14/2019 1:31:40 PM   Radiology No results found.  Procedures Procedures (including critical care time)  Medications Ordered in ED Medications  0.9 %  sodium chloride infusion (has no administration in time range)  diltiazem (CARDIZEM) 1 mg/mL load via infusion 15 mg (has no administration in time range)    And  diltiazem (CARDIZEM) 125 mg in dextrose 5% 125 mL (1 mg/mL) infusion (has no administration in time range)    ED Course  I have reviewed the triage vital signs and the nursing notes.  Pertinent labs & imaging results that were available during my care of the patient were reviewed by me and considered in my medical decision making (see chart for details).    MDM Rules/Calculators/A&P                         Patient has evidence of CHF on his chest x-ray.  He was given Lasix 80 mg IV push and started on Cardizem drip.  Will be admitted to the hospital service  CRITICAL CARE Performed by: Leota Jacobsen Total critical care time: 65 minutes Critical care time was exclusive of separately billable procedures and treating other patients. Critical care was necessary to treat or prevent  imminent or life-threatening deterioration. Critical care was time spent personally by me on the following activities: development of treatment plan with patient and/or surrogate as well as nursing, discussions with consultants, evaluation of patient's response to treatment, examination of patient, obtaining history from patient or surrogate, ordering and performing treatments and interventions, ordering and review of laboratory studies, ordering and review of radiographic studies, pulse oximetry  and re-evaluation of patient's condition.  Final Clinical Impression(s) / ED Diagnoses Final diagnoses:  None    Rx / DC Orders ED Discharge Orders    None       Lorre Nick, MD 10/14/19 1448

## 2019-10-14 NOTE — H&P (Addendum)
History and Physical:    Alfred Mercado   CXK:[id number] DOB: Apr 06, 1936 DOA: 10/14/2019  Referring MD/provider: Dr. Freida Busman PCP: Patient, No Pcp Per   Patient coming from: Home  Chief Complaint: Shortness of breath  History of Present Illness:   Alfred Mercado is an 84 y.o. male who had no known past medical history until 3 months ago when he presented to the ED with dyspnea and found to be in A. fib with RVR.  He was admitted to the teaching service where work-up revealed pulmonary vascular congestion with right-sided pleural effusion and EF of 50 to 55% with LVH.  Patient was treated with diuresis and discharged home on Lasix and Eliquis with no oxygen requirement.  Patient states that he did well as long as he was on his medications however he ran out of his medications 2 weeks ago.  He notes that he is unable to afford his medications "and one of them cost $2200".  Patient notes he slowly developed increasing dyspnea on exertion.  2 to 3 days ago he became short of breath at rest and today he was quite dizzy just sitting even without trying to exert himself.  Patient denies having chest pain.  He does admit to having orthopnea at baseline.  Not sure if he is PND, notes he is not sleeping very much.  ED Course:  The patient is noted to be in A. fib with RVR.  Patient was treated with diltiazem and placed on a diltiazem drip.  X-ray reveals increased left lower lobe effusion as well as some right middle lobe vascular congestion.  Patient was placed on oxygen and given Lasix 20 mg IV x1.  Hospitalist were called to admit.  ROS:   ROS   Review of Systems: General: Denies fever, chills, malaise,  Endocrine: Denies heat/cold intolerance, polyuria or weight loss. GI: Denies nausea, vomiting, diarrhea or constipation GU: Denies dysuria, frequency or hematuria CNS: Denies HA, dizziness, confusion, new weakness or clumsiness   Past Medical History:   No past medical history on  file.  Past Surgical History:   No known past surgical history  Social History:   Social History   Socioeconomic History  . Marital status: Divorced    Spouse name: Not on file  . Number of children: Not on file  . Years of education: Not on file  . Highest education level: Not on file  Occupational History  . Not on file  Tobacco Use  . Smoking status: Not on file  Substance and Sexual Activity  . Alcohol use: Not on file  . Drug use: Not on file  . Sexual activity: Not on file  Other Topics Concern  . Not on file  Social History Narrative  . Not on file   Social Determinants of Health   Financial Resource Strain:   . Difficulty of Paying Living Expenses: Not on file  Food Insecurity:   . Worried About Programme researcher, broadcasting/film/video in the Last Year: Not on file  . Ran Out of Food in the Last Year: Not on file  Transportation Needs:   . Lack of Transportation (Medical): Not on file  . Lack of Transportation (Non-Medical): Not on file  Physical Activity:   . Days of Exercise per Week: Not on file  . Minutes of Exercise per Session: Not on file  Stress:   . Feeling of Stress : Not on file  Social Connections:   . Frequency of Communication with Friends and  Family: Not on file  . Frequency of Social Gatherings with Friends and Family: Not on file  . Attends Religious Services: Not on file  . Active Member of Clubs or Organizations: Not on file  . Attends Banker Meetings: Not on file  . Marital Status: Not on file  Intimate Partner Violence:   . Fear of Current or Ex-Partner: Not on file  . Emotionally Abused: Not on file  . Physically Abused: Not on file  . Sexually Abused: Not on file    Allergies   Patient has no known allergies.  Family history:   No family history on file.  Current Medications:   Prior to Admission medications   Medication Sig Start Date End Date Taking? Authorizing Provider  acetaminophen (TYLENOL) 325 MG tablet Take 650 mg  by mouth every 6 (six) hours as needed for mild pain or headache.   Yes [provider]  diltiazem (CARDIZEM CD) 180 MG 24 hr capsule Take 1 capsule (180 mg total) by mouth daily. 07/15/19 10/14/19 Yes Thom Chimes, MD  furosemide (LASIX) 80 MG tablet Take 1 tablet (80 mg total) by mouth 2 (two) times daily. 07/15/19  Yes Thom Chimes, MD  ibuprofen (ADVIL) 200 MG tablet Take 600 mg by mouth See admin instructions. Take 600 mg by mouth one to two times a day as needed for mild pain or malaise   Yes [provider]  metoprolol succinate (TOPROL-XL) 50 MG 24 hr tablet Take 50 mg by mouth daily. 08/18/19  Yes [provider]  apixaban (ELIQUIS) 5 MG TABS tablet Take 1 tablet (5 mg total) by mouth 2 (two) times daily. Patient not taking: Reported on 10/14/2019 07/15/19   Thom Chimes, MD  metoprolol succinate (TOPROL XL) 25 MG 24 hr tablet Take 1 tablet (25 mg total) by mouth daily. Patient not taking: Reported on 10/14/2019 07/15/19 10/14/19  Thom Chimes, MD  potassium chloride SA (KLOR-CON) 20 MEQ tablet Take 1 tablet (20 mEq total) by mouth daily for 14 days. Patient not taking: Reported on 10/14/2019 07/16/19 10/14/19  Thom Chimes, MD    Physical Exam:   Vitals:   10/14/19 1445 10/14/19 1500 10/14/19 1519 10/14/19 1530  BP: 118/82 105/78 (!) 125/94 (!) 122/91  Pulse: (!) 48 (!) 124 100 70  Resp: (!) 34 (!) 34 20 (!) 29  Temp:      TempSrc:      SpO2: 98% 99% 100% 95%  Weight:      Height:         Physical Exam: Blood pressure (!) 122/91, pulse 70, temperature (!) 97.5 F (36.4 C), temperature source Oral, resp. rate (!) 29, height 6\' 2"  (1.88 m), weight 95.3 kg, SpO2 95 %. Gen: Barrel chested man sitting up in bed with mildly labored tachypnea, able to speak in short sentences but takes of breath between 2-3 words.  No accessory muscle use. Eyes: sclera anicteric, conjuctiva bilaterally injected, no lid lag, no exophthalmos, EOMI CVS: S1-S2 clear, no murmur  rubs or gallops, no LE edema, normal pedal pulses  Respiratory: Tachypnea, marked decrease in breath sounds at right base to halfway up.  Minimal rales at left base.  No wheezes or rhonchi noted. GI: Obese, NABS, soft, NT LE: No edema.  Neuro: A/O x 3, Moving all extremities equally with normal strength, CN 3-12 intact, grossly nonfocal.  Psych: patient is cooperative, judgement is poor, mood and affect appropriate to situation. Skin: no rashes or lesions or ulcers,  Data Review:    Labs: Basic Metabolic Panel: Recent Labs  Lab 10/14/19 1330  NA 139  K 4.0  CL 107  CO2 21*  GLUCOSE 131*  BUN 12  CREATININE 1.13  CALCIUM 8.8*   Liver Function Tests: Recent Labs  Lab 10/14/19 1330  AST 26  ALT 29  ALKPHOS 84  BILITOT 0.8  PROT 5.9*  ALBUMIN 3.4*   No results for input(s): LIPASE, AMYLASE in the last 168 hours. No results for input(s): AMMONIA in the last 168 hours. CBC: Recent Labs  Lab 10/14/19 1330  WBC 8.4  NEUTROABS 6.7  HGB 12.6*  HCT 39.1  MCV 93.8  PLT 245   Cardiac Enzymes: No results for input(s): CKTOTAL, CKMB, CKMBINDEX, TROPONINI in the last 168 hours.  BNP (last 3 results) No results for input(s): PROBNP in the last 8760 hours. CBG: No results for input(s): GLUCAP in the last 168 hours.  Urinalysis No results found for: COLORURINE, APPEARANCEUR, LABSPEC, PHURINE, GLUCOSEU, HGBUR, BILIRUBINUR, KETONESUR, PROTEINUR, UROBILINOGEN, NITRITE, LEUKOCYTESUR    Radiographic Studies: DG Chest Port 1 View  Result Date: 10/14/2019 CLINICAL DATA:  84 year old male with history of shortness of breath and tachycardia. EXAM: PORTABLE CHEST 1 VIEW COMPARISON:  Chest x-ray 07/14/2019. FINDINGS: Dense opacity in the right mid to lower hemithorax compatible with an enlarging right pleural effusion with underlying atelectasis and/or consolidation throughout the right middle and lower lobes. Left lung is clear. No left pleural effusion. No pneumothorax. No  evidence of pulmonary edema. Moderate cardiomegaly, similar to prior studies. Upper mediastinal contours are within normal limits. IMPRESSION: 1. Enlarging moderate to large right pleural effusion with extensive atelectasis and/or consolidation in the right middle and lower lobes. 2. Moderate cardiomegaly. Electronically Signed   By: Vinnie Langton M.D.   On: 10/14/2019 14:03    EKG: Independently reviewed.  A. fib at 140.  Left axis deviation at -50.  Mild 1/2 mm scooping at V5 and V6 of ST segments possibly rate related.   Assessment/Plan:   Principal Problem:   Dyspnea Active Problems:   Atrial fibrillation with rapid ventricular response (HCC)   Pleural effusion, right   Acute heart failure with preserved ejection fraction ([place])   84 year old man recently diagnosed with HFpEF, A. fib with RVR and right lower lobe effusion was discharged 3 months ago on Lasix, Eliquis, Toprol-XL and diltiazem now presents with 3 days of worsening shortness of breath at rest after running out of his medicines 2 to 3 weeks ago.  In ED he was found to be in A. fib with RVR and chest x-ray shows worsened right lower lobe effusion.  HYPOXIA/DYSPNEA Patient clearly seems to have some vascular congestion on chest x-ray and by exam and history. Will treat with Lasix 80 mg IV twice daily Chest x-ray is read as possible consolidation however there is no signs or symptoms of pneumonia.  Patient has no malaise fevers chills cough and there is no leukocytosis so I am not covering him for pneumonia. Chest CT is ordered to better describe processes under that pleural effusion.  AFIB WITH RVR  Rate reasonably controlled on diltiazem drip at present We will start patient on metoprolol 25 mg p.o. twice daily today Diltiazem drip can be tapered down as metoprolol controls HR. He may need additional oral diltiazem vs  just go up on his metoprolol. Of note, patient was discharged on Toprol XL 50 mg and diltiazem 180  daily Upon discharge patient would benefit from being placed on  metoprolol tartrate rather than Toprol-XL as it is much cheaper. Also of note patient was discharged on Eliquis which he is unable to afford. Will start patient on warfarin per pharmacy consultation given ChadsVasc2 score of 3. Certainly warfarin is much more affordable than Eliquis.  HFpEF I have started patient on 80 mg IV twice daily Follow daily weights, strict I's and O's, sodium, potassium and creatinine. Echocardiogram done 07/11/2019 shows EF estimated to be 50 to 55% with moderate LVH. As patient was in A. fib they could not comment on diastolic function.  RLL EFFUSION Patient underwent 1.5 L thoracentesis on last admission, was found to have transudative fluid. He may well benefit from another thoracentesis if diuresis is ineffective I have ordered a CT to see if we can better see what is going on underneath the effusion  Other information:   DVT prophylaxis: Warfarin ordered, SCD until warfarin is therapeutic.   Code Status: Full Family Communication: Patient has no family Disposition Plan: SRO where he is presently living Consults called: None Admission status: Inpatient  Alfred Mercado Alfred Mercado [facility]  If 7PM-7AM, please contact night-coverage [email or web address] Password [facility] 10/14/2019, 4:35 PM

## 2019-10-14 NOTE — ED Notes (Signed)
Portable xray at bedside.

## 2019-10-15 LAB — BASIC METABOLIC PANEL
Anion gap: 7 (ref 5–15)
BUN: 15 mg/dL (ref 8–23)
CO2: 24 mmol/L (ref 22–32)
Calcium: 8.7 mg/dL — ABNORMAL LOW (ref 8.9–10.3)
Chloride: 107 mmol/L (ref 98–111)
Creatinine, Ser: 1.24 mg/dL (ref 0.61–1.24)
GFR calc Af Amer: >60 mL/min (ref >60)
GFR calc non Af Amer: 54 mL/min — ABNORMAL LOW (ref >60)
Glucose, Bld: 105 mg/dL — ABNORMAL HIGH (ref 70–99)
Potassium: 4 mmol/L (ref 3.5–5.1)
Sodium: 138 mmol/L (ref 135–145)

## 2019-10-15 LAB — PROTIME-INR
INR: 1.1 (ref 0.8–1.2)
Prothrombin Time: 14.5 seconds (ref 11.4–15.2)

## 2019-10-15 LAB — CBC
HCT: 35.8 % — ABNORMAL LOW (ref 39.0–52.0)
Hemoglobin: 11.8 g/dL — ABNORMAL LOW (ref 13.0–17.0)
MCH: 30.6 pg (ref 26.0–34.0)
MCHC: 33 g/dL (ref 30.0–36.0)
MCV: 92.7 fL (ref 80.0–100.0)
Platelets: 231 10*3/uL (ref 150–400)
RBC: 3.86 MIL/uL — ABNORMAL LOW (ref 4.22–5.81)
RDW: 14.3 % (ref 11.5–15.5)
WBC: 8.3 10*3/uL (ref 4.0–10.5)
nRBC: 0 % (ref 0.0–0.2)

## 2019-10-15 MED ORDER — DILTIAZEM HCL 60 MG PO TABS
60.0000 mg | ORAL_TABLET | Freq: Three times a day (TID) | ORAL | Status: DC
  Administered 2019-10-15 – 2019-10-17 (×7): 60 mg via ORAL
  Filled 2019-10-15 (×7): qty 1

## 2019-10-15 MED ORDER — WARFARIN SODIUM 5 MG PO TABS
5.0000 mg | ORAL_TABLET | Freq: Once | ORAL | Status: AC
  Administered 2019-10-15: 17:00:00 5 mg via ORAL
  Filled 2019-10-15: qty 1

## 2019-10-15 NOTE — Discharge Instructions (Addendum)
Heart Failure, Self Care Heart failure is a serious condition. This sheet explains things you need to do to take care of yourself at home. To help you stay as healthy as possible, you may be asked to change your diet, take certain medicines, and make other changes in your life. Your doctor may also give you more specific instructions. If you have problems or questions, call your doctor. What are the risks? Having heart failure makes it more likely for you to have some problems. These problems can get worse if you do not take good care of yourself. Problems may include:  Blood clotting problems. This may cause a stroke.  Damage to the kidneys, liver, or lungs.  Abnormal heart rhythms. Supplies needed:  Scale for weighing yourself.  Blood pressure monitor.  Notebook.  Medicines. How to care for yourself when you have heart failure Medicines Take over-the-counter and prescription medicines only as told by your doctor. Take your medicines every day.  Do not stop taking your medicine unless your doctor tells you to do so.  Do not skip any medicines.  Get your prescriptions refilled before you run out of medicine. This is important. Eating and drinking   Eat heart-healthy foods. Talk with a diet specialist (dietitian) to create an eating plan.  Choose foods that: ? Have no trans fat. ? Are low in saturated fat and cholesterol.  Choose healthy foods, such as: ? Fresh or frozen fruits and vegetables. ? Fish. ? Low-fat (lean) meats. ? Legumes, such as beans, peas, and lentils. ? Fat-free or low-fat dairy products. ? Whole-grain foods. ? High-fiber foods.  Limit salt (sodium) if told by your doctor. Ask your diet specialist to tell you which seasonings are healthy for your heart.  Cook in healthy ways instead of frying. Healthy ways of cooking include roasting, grilling, broiling, baking, poaching, steaming, and stir-frying.  Limit how much fluid you drink, if told by your  doctor. Alcohol use  Do not drink alcohol if: ? Your doctor tells you not to drink. ? Your heart was damaged by alcohol, or you have very bad heart failure. ? You are pregnant, may be pregnant, or are planning to become pregnant.  If you drink alcohol: ? Limit how much you use to:  0-1 drink a day for women.  0-2 drinks a day for men. ? Be aware of how much alcohol is in your drink. In the [place] one drink equals one 12 oz bottle of beer (355 mL), one 5 oz glass of wine (148 mL), or one 1 oz glass of hard liquor (44 mL). Lifestyle   Do not use any products that contain nicotine or tobacco, such as cigarettes, e-cigarettes, and chewing tobacco. If you need help quitting, ask your doctor. ? Do not use nicotine gum or patches before talking to your doctor.  Do not use illegal drugs.  Lose weight if told by your doctor.  Do physical activity if told by your doctor. Talk to your doctor before you begin an exercise if: ? You are an older adult. ? You have very bad heart failure.  Learn to manage stress. If you need help, ask your doctor.  Get rehab (rehabilitation) to help you stay independent and to help with your quality of life.  Plan time to rest when you get tired. Check weight and blood pressure   Weigh yourself every day. This will help you to know if fluid is building up in your body. ? Weigh yourself every  morning after you pee (urinate) and before you eat breakfast. ? Wear the same amount of clothing each time. ? Write down your daily weight. Give your record to your doctor.  Check and write down your blood pressure as told by your doctor.  Check your pulse as told by your doctor. Dealing with very hot and very cold weather  If it is very hot: ? Avoid activities that take a lot of energy. ? Use air conditioning or fans, or find a cooler place. ? Avoid caffeine and alcohol. ? Wear clothing that is loose-fitting, lightweight, and light-colored.  If it is very  cold: ? Avoid activities that take a lot of energy. ? Layer your clothes. ? Wear mittens or gloves, a hat, and a scarf when you go outside. ? Avoid alcohol. Follow these instructions at home:  Stay up to date with shots (vaccines). Get pneumococcal and flu (influenza) shots.  Keep all follow-up visits as told by your doctor. This is important. Contact a doctor if:  You gain weight quickly.  You have increasing shortness of breath.  You cannot do your normal activities.  You get tired easily.  You cough a lot.  You don't feel like eating or feel like you may vomit (nauseous).  You become puffy (swell) in your hands, feet, ankles, or belly (abdomen).  You cannot sleep well because it is hard to breathe.  You feel like your heart is beating fast (palpitations).  You get dizzy when you stand up. Get help right away if:  You have trouble breathing.  You or someone else notices a change in your behavior, such as having trouble staying awake.  You have chest pain or discomfort.  You pass out (faint). These symptoms may be an emergency. Do not wait to see if the symptoms will go away. Get medical help right away. Call your local emergency services (911 in the [place] Do not drive yourself to the hospital. Summary  Heart failure is a serious condition. To care for yourself, you may have to change your diet, take medicines, and make other lifestyle changes.  Take your medicines every day. Do not stop taking them unless your doctor tells you to do so.  Eat heart-healthy foods, such as fresh or frozen fruits and vegetables, fish, lean meats, legumes, fat-free or low-fat dairy products, and whole-grain or high-fiber foods.  Ask your doctor if you can drink alcohol. You may have to stop alcohol use if you have very bad heart failure.  Contact your doctor if you gain weight quickly or feel that your heart is beating too fast. Get help right away if you pass out, or have chest pain  or trouble breathing. This information is not intended to replace advice given to you by your health care provider. Make sure you discuss any questions you have with your health care provider. Document Revised: 11/25/2018 Document Reviewed: 11/26/2018 Elsevier Patient Education  2020 [employer]. Information on my medicine - Coumadin   (Warfarin)  Why was Coumadin prescribed for you? Coumadin was prescribed for you because you have a blood clot or a medical condition that can cause an increased risk of forming blood clots. Blood clots can cause serious health problems by blocking the flow of blood to the heart, lung, or brain. Coumadin can prevent harmful blood clots from forming. As a reminder your indication for Coumadin is:   Stroke Prevention Because Of Atrial Fibrillation  What test will check on my response to Coumadin? While  on Coumadin (warfarin) you will need to have an INR test regularly to ensure that your dose is keeping you in the desired range. The INR (international normalized ratio) number is calculated from the result of the laboratory test called prothrombin time (PT).  If an INR APPOINTMENT HAS NOT ALREADY BEEN MADE FOR YOU please schedule an appointment to have this lab work done by your health care provider within 7 days. Your INR goal is usually a number between:  2 to 3 or your provider may give you a more narrow range like 2-2.5.  Ask your health care provider during an office visit what your goal INR is.  What  do you need to  know  About  COUMADIN? Take Coumadin (warfarin) exactly as prescribed by your healthcare provider about the same time each day.  DO NOT stop taking without talking to the doctor who prescribed the medication.  Stopping without other blood clot prevention medication to take the place of Coumadin may increase your risk of developing a new clot or stroke.  Get refills before you run out.  What do you do if you miss a dose? If you miss a dose, take  it as soon as you remember on the same day then continue your regularly scheduled regimen the next day.  Do not take two doses of Coumadin at the same time.  Important Safety Information A possible side effect of Coumadin (Warfarin) is an increased risk of bleeding. You should call your healthcare provider right away if you experience any of the following: ? Bleeding from an injury or your nose that does not stop. ? Unusual colored urine (red or dark brown) or unusual colored stools (red or black). ? Unusual bruising for unknown reasons. ? A serious fall or if you hit your head (even if there is no bleeding).  Some foods or medicines interact with Coumadin (warfarin) and might alter your response to warfarin. To help avoid this: ? Eat a balanced diet, maintaining a consistent amount of Vitamin K. ? Notify your provider about major diet changes you plan to make. ? Avoid alcohol or limit your intake to 1 drink for women and 2 drinks for men per day. (1 drink is 5 oz. wine, 12 oz. beer, or 1.5 oz. liquor.)  Make sure that ANY health care provider who prescribes medication for you knows that you are taking Coumadin (warfarin).  Also make sure the healthcare provider who is monitoring your Coumadin knows when you have started a new medication including herbals and non-prescription products.  Coumadin (Warfarin)  Major Drug Interactions  Increased Warfarin Effect Decreased Warfarin Effect  Alcohol (large quantities) Antibiotics (esp. Septra/Bactrim, Flagyl, Cipro) Amiodarone (Cordarone) Aspirin (ASA) Cimetidine (Tagamet) Megestrol (Megace) NSAIDs (ibuprofen, naproxen, etc.) Piroxicam (Feldene) Propafenone (Rythmol SR) Propranolol (Inderal) Isoniazid (INH) Posaconazole (Noxafil) Barbiturates (Phenobarbital) Carbamazepine (Tegretol) Chlordiazepoxide (Librium) Cholestyramine (Questran) Griseofulvin Oral Contraceptives Rifampin Sucralfate (Carafate) Vitamin K   Coumadin (Warfarin)  Major Herbal Interactions  Increased Warfarin Effect Decreased Warfarin Effect  Garlic Ginseng Ginkgo biloba Coenzyme Q10 Green tea St. John's wort    Coumadin (Warfarin) FOOD Interactions  Eat a consistent number of servings per week of foods HIGH in Vitamin K (1 serving =  cup)  Collards (cooked, or boiled & drained) Kale (cooked, or boiled & drained) Mustard greens (cooked, or boiled & drained) Parsley *serving size only =  cup Spinach (cooked, or boiled & drained) Swiss chard (cooked, or boiled & drained) Turnip greens (cooked, or boiled & drained)  Eat a consistent number of servings per week of foods MEDIUM-HIGH in Vitamin K (1 serving = 1 cup)  Asparagus (cooked, or boiled & drained) Broccoli (cooked, boiled & drained, or raw & chopped) Brussel sprouts (cooked, or boiled & drained) *serving size only =  cup Lettuce, raw (green leaf, endive, romaine) Spinach, raw Turnip greens, raw & chopped   These websites have more information on Coumadin (warfarin):  http://www.king-russell.com/; https://www.hines.net/;

## 2019-10-15 NOTE — Progress Notes (Signed)
ANTICOAGULATION CONSULT NOTE  Pharmacy Consult for warfarin Indication: atrial fibrillation  No Known Allergies  Patient Measurements: Height: 6\' 2"  (188 cm) Weight: 203 lb 1.6 oz (92.1 kg) IBW/kg (Calculated) : 82.2 Heparin Dosing Weight:   Vital Signs: Temp: 98.1 F (36.7 C) (02/18 0758) Temp Source: Oral (02/18 0758) BP: 112/58 (02/18 0758) Pulse Rate: 85 (02/18 0758)  Labs: Recent Labs    10/14/19 1330 10/15/19 0429  HGB 12.6* 11.8*  HCT 39.1 35.8*  PLT 245 231  APTT 31  --   LABPROT 14.8 14.5  INR 1.2 1.1  CREATININE 1.13 1.24    Estimated Creatinine Clearance: 53.4 mL/min (by C-G formula based on SCr of 1.24 mg/dL).   Medical History: No past medical history on file.  Medications:  Medications Prior to Admission  Medication Sig Dispense Refill Last Dose  . acetaminophen (TYLENOL) 325 MG tablet Take 650 mg by mouth every 6 (six) hours as needed for mild pain or headache.   unk at 10/17/19  . diltiazem (CARDIZEM CD) 180 MG 24 hr capsule Take 1 capsule (180 mg total) by mouth daily. 30 capsule 1 09/30/2019 at Unknown time  . furosemide (LASIX) 80 MG tablet Take 1 tablet (80 mg total) by mouth 2 (two) times daily. 60 tablet 1 09/30/2019 at Unknown time  . ibuprofen (ADVIL) 200 MG tablet Take 600 mg by mouth See admin instructions. Take 600 mg by mouth one to two times a day as needed for mild pain or malaise   10/14/2019 at am  . metoprolol succinate (TOPROL-XL) 50 MG 24 hr tablet Take 50 mg by mouth daily.   09/30/2019 at Unknown time  . apixaban (ELIQUIS) 5 MG TABS tablet Take 1 tablet (5 mg total) by mouth 2 (two) times daily. (Patient not taking: Reported on 10/14/2019) 60 tablet 1 See note at Unknown time  . metoprolol succinate (TOPROL XL) 25 MG 24 hr tablet Take 1 tablet (25 mg total) by mouth daily. (Patient not taking: Reported on 10/14/2019) 30 tablet 1 Not Taking at Unknown time  . potassium chloride SA (KLOR-CON) 20 MEQ tablet Take 1 tablet (20 mEq total) by mouth  daily for 14 days. (Patient not taking: Reported on 10/14/2019) 14 tablet 0 Not Taking at Unknown time   Scheduled:  . furosemide  80 mg Intravenous Q12H  . influenza vaccine adjuvanted  0.5 mL Intramuscular Tomorrow-1000  . metoprolol tartrate  25 mg Oral BID  . Warfarin - Pharmacist Dosing Inpatient   Does not apply q1800   Infusions:  . sodium chloride    . diltiazem (CARDIZEM) infusion 5 mg/hr (10/14/19 1359)    Assessment: 84 y/o male with complaints of shortness of breath, found to be in AF with RVR (CHADSVASC = 3). He had Eliquis prescribed PTA, but has not taken this medication in at least 30 days. He may have trouble affording his medications. Pharmacy has been consulted to dose warfarin.  -INR-= 1.1    Goal of Therapy:  INR 2-3 Monitor platelets by anticoagulation protocol: Yes   Plan:  - Warfarin 5 mg PO x1 this evening - Monitor INR daily,

## 2019-10-15 NOTE — TOC Progression Note (Signed)
Transition of Care [facility]) - Progression Note    Patient Details  Name: Alfred Mercado MRN: [id number] Date of Birth: [birth year]  [facility] of [facility]) CM/SW Contact  Leone Haven, RN Phone Number: 10/15/2019, 3:43 PM  Clinical Narrative:    NCM spoke with patient, he does not have a PCP or insurance, he states he has 30.00  To get meds with. He states he is not sure if he will have transportation at discharge.  NCM will try to get him a follow up apt with [facility].  Will Need to ast him with TOC /Match for meds.         Expected Discharge Plan and Services                                                 Social Determinants of Health (SDOH) Interventions    Readmission Risk Interventions No flowsheet data found.

## 2019-10-15 NOTE — Progress Notes (Signed)
Progress Note    Alfred Mercado  GLO:[id number] DOB: Apr 14, 1936  DOA: 10/14/2019 PCP: Patient, No Pcp Per    Brief Narrative:    Medical records reviewed and are as summarized below:  Alfred Mercado is an 84 y.o. male who had no known past medical history until 3 months ago when he presented to the ED with dyspnea and found to be in A. fib with RVR.  He was admitted to the teaching service where work-up revealed pulmonary vascular congestion with right-sided pleural effusion and EF of 50 to 55% with LVH.  Patient was treated with diuresis and discharged home on Lasix and Eliquis with no oxygen requirement.  Patient states that he did well as long as he was on his medications however he ran out of his medications 2 weeks ago.  He notes that he is unable to afford his medications "and one of them cost $2200".  Patient notes he slowly developed increasing dyspnea on exertion.  2 to 3 days ago he became short of breath at rest and today he was quite dizzy just sitting even without trying to exert himself.  Patient denies having chest pain.  He does admit to having orthopnea at baseline.  Not sure if he is PND, notes he is not sleeping very much.  Assessment/Plan:   Principal Problem:   Dyspnea Active Problems:   Atrial fibrillation with rapid ventricular response (HCC)   Pleural effusion, right   Acute heart failure with preserved ejection fraction (HCC)   Atrial fibrillation with RVR (HCC)    Acute respiratory failure Patient clearly seems to have some vascular congestion on chest x-ray and by exam and history. -treat with Lasix 80 mg IV twice daily -May need thoracentesis if not improved with IV Lasix  AFIB WITH RVR  Rate controlled on diltiazem drip at present-- will change to PO as he was well controlled on that in the past -in the ER, patient was started on metoprolol 25 mg p.o. twice daily (also d/c'd on this last admission) -In the ER, patient was started on warfarin per  pharmacy consultation given ChadsVasc2 score of 3. Certainly warfarin is much more affordable than Eliquis but there is concern with patient's ability to follow-up for INR checks so will consult TOC  Acute on chronic diastolic CHF  80 mg IV twice daily Follow daily weights, strict I's and O's Echocardiogram done 07/11/2019 shows EF estimated to be 50 to 55% with moderate LVH. As patient was in A. fib they could not comment on diastolic function.  RLL EFFUSION Patient underwent 1.5 L thoracentesis on last admission, was found to have transudative fluid. He may well benefit from another thoracentesis if diuresis is ineffective   Family Communication/Anticipated D/C date and plan/Code Status   DVT prophylaxis: Patient was on Eliquis but could not afford it so warfarin ordered, not sure how well patient will do with this as he is not compliant with follow-up nor does he have anyone to follow-up with Code Status: Full Code.  Disposition Plan: Patient states he lives in a hotel.  Per patient he has no family.  Per chart it says he has no PCP or insurance which I find odd in an 84 year old.  TOC consult for safe discharge plan   Medical Consultants:    None.     Subjective:   Patient states he is lives in a hotel and does not have any family to help nor does he have transportation.  Occasionally he does  have a neighbor who will transport him to doctor's appointment  Objective:    Vitals:   10/15/19 0040 10/15/19 0411 10/15/19 0758 10/15/19 1104  BP: 99/65 105/63 (!) 112/58 101/66  Pulse: 95 81 85 83  Resp: 18 19 (!) 21 20  Temp: (!) 97.5 F (36.4 C) 97.9 F (36.6 C) 98.1 F (36.7 C) 97.7 F (36.5 C)  TempSrc: Oral Oral Oral Oral  SpO2: 96% 98% 98% 95%  Weight:  92.1 kg    Height:        Intake/Output Summary (Last 24 hours) at 10/15/2019 1155 Last data filed at 10/15/2019 0900 Gross per 24 hour  Intake 550 ml  Output 2100 ml  Net -1550 ml   Filed Weights    10/14/19 1319 10/15/19 0411  Weight: 95.3 kg 92.1 kg    Exam: Lying in bed on 2 L nasal cannula Mild increased work of breathing Decrease in breath sounds on the right side No wheezing Irregular but rate controlled Positive bowel sounds soft nontender Alert and oriented x3 Appears to have limited insight into medical issues  Data Reviewed:   I have personally reviewed following labs and imaging studies:  Labs: Labs show the following:   Basic Metabolic Panel: Recent Labs  Lab 10/14/19 1330 10/15/19 0429  NA 139 138  K 4.0 4.0  CL 107 107  CO2 21* 24  GLUCOSE 131* 105*  BUN 12 15  CREATININE 1.13 1.24  CALCIUM 8.8* 8.7*   GFR Estimated Creatinine Clearance: 53.4 mL/min (by C-G formula based on SCr of 1.24 mg/dL). Liver Function Tests: Recent Labs  Lab 10/14/19 1330  AST 26  ALT 29  ALKPHOS 84  BILITOT 0.8  PROT 5.9*  ALBUMIN 3.4*   No results for input(s): LIPASE, AMYLASE in the last 168 hours. No results for input(s): AMMONIA in the last 168 hours. Coagulation profile Recent Labs  Lab 10/14/19 1330 10/15/19 0429  INR 1.2 1.1    CBC: Recent Labs  Lab 10/14/19 1330 10/15/19 0429  WBC 8.4 8.3  NEUTROABS 6.7  --   HGB 12.6* 11.8*  HCT 39.1 35.8*  MCV 93.8 92.7  PLT 245 231   Cardiac Enzymes: No results for input(s): CKTOTAL, CKMB, CKMBINDEX, TROPONINI in the last 168 hours. BNP (last 3 results) No results for input(s): PROBNP in the last 8760 hours. CBG: Recent Labs  Lab 10/14/19 1744  GLUCAP 109*   D-Dimer: No results for input(s): DDIMER in the last 72 hours. Hgb A1c: No results for input(s): HGBA1C in the last 72 hours. Lipid Profile: No results for input(s): CHOL, HDL, LDLCALC, TRIG, CHOLHDL, LDLDIRECT in the last 72 hours. Thyroid function studies: No results for input(s): TSH, T4TOTAL, T3FREE, THYROIDAB in the last 72 hours.  Invalid input(s): FREET3 Anemia work up: No results for input(s): VITAMINB12, FOLATE, FERRITIN,  TIBC, IRON, RETICCTPCT in the last 72 hours. Sepsis Labs: Recent Labs  Lab 10/14/19 1330 10/15/19 0429  WBC 8.4 8.3    Microbiology Recent Results (from the past 240 hour(s))  SARS CORONAVIRUS 2 (TAT 6-24 HRS) Nasopharyngeal Nasopharyngeal Swab     Status: None   Collection Time: 10/14/19  5:18 PM   Specimen: Nasopharyngeal Swab  Result Value Ref Range Status   SARS Coronavirus 2 NEGATIVE NEGATIVE Final    Comment: (NOTE) SARS-CoV-2 target nucleic acids are NOT DETECTED. The SARS-CoV-2 RNA is generally detectable in upper and lower respiratory specimens during the acute phase of infection. Negative results do not preclude SARS-CoV-2 infection, do  not rule out co-infections with other pathogens, and should not be used as the sole basis for treatment or other patient management decisions. Negative results must be combined with clinical observations, patient history, and epidemiological information. The expected result is Negative. Fact Sheet for Patients: HairSlick.no Fact Sheet for Healthcare Providers: quierodirigir.com This test is not yet approved or cleared by the [place] FDA and  has been authorized for detection and/or diagnosis of SARS-CoV-2 by FDA under an Emergency Use Authorization (EUA). This EUA will remain  in effect (meaning this test can be used) for the duration of the COVID-19 declaration under Section 56 4(b)(1) of the Act, 21 U.S.C. section 360bbb-3(b)(1), unless the authorization is terminated or revoked sooner. Performed at [facility], [address] [place], [place] [address]     Procedures and diagnostic studies:  CT CHEST WO CONTRAST  Result Date: 10/14/2019 CLINICAL DATA:  84 year old male with shortness of breath and tachycardia. Right pleural effusion versus consolidation. EXAM: CT CHEST WITHOUT CONTRAST TECHNIQUE: Multidetector CT imaging of the chest was performed following  the standard protocol without IV contrast. COMPARISON:  Portable chest earlier today. Chest CTA 07/11/2019. FINDINGS: Cardiovascular: Vascular patency is not evaluated in the absence of IV contrast. Calcified coronary artery and aortic atherosclerosis. Cardiomegaly appears stable since November. No pericardial effusion. Mediastinum/Nodes: Negative. No mediastinal lymphadenopathy. Lungs/Pleura: Moderate layering right pleural effusion, similar size to that in November. Simple fluid density suggesting a transudate. Superimposed compressive right lung atelectasis, a especially in the lower lobe. Upper lobe centrilobular emphysema more apparent on the right. The trachea through mainstem bronchi are patent although there is sub solid material in the right bronchus intermedius on series 8, image 85 which is new since November. In the left lung there is a small layering effusion which has decreased since November. Mild left lung atelectasis. No inflammatory appearing pulmonary opacity. Upper Abdomen: Stable visible noncontrast liver, gallbladder, spleen, pancreas and adrenal glands. Negative visible bowel in the upper abdomen. Musculoskeletal: Advanced degenerative changes in the thoracic and visible cervical spine. Stable small round sclerotic focus in the posterior right 8th rib, probably a benign bone island. No acute osseous abnormality identified. IMPRESSION: 1. Moderate layering right pleural effusion with compressive atelectasis, similar to that in November. A small left pleural effusion has decreased since November. 2. Evidence of retained secretions in the bronchus intermedius, but no pulmonary opacity suspicious for pneumonia. Emphysema (ICD10-J43.9). 3. Cardiomegaly. Coronary artery and Aortic atherosclerosis (ICD10-I70.0). Electronically Signed   By: Odessa Fleming M.D.   On: 10/14/2019 20:22   DG Chest Port 1 View  Result Date: 10/14/2019 CLINICAL DATA:  84 year old male with history of shortness of breath and  tachycardia. EXAM: PORTABLE CHEST 1 VIEW COMPARISON:  Chest x-ray 07/14/2019. FINDINGS: Dense opacity in the right mid to lower hemithorax compatible with an enlarging right pleural effusion with underlying atelectasis and/or consolidation throughout the right middle and lower lobes. Left lung is clear. No left pleural effusion. No pneumothorax. No evidence of pulmonary edema. Moderate cardiomegaly, similar to prior studies. Upper mediastinal contours are within normal limits. IMPRESSION: 1. Enlarging moderate to large right pleural effusion with extensive atelectasis and/or consolidation in the right middle and lower lobes. 2. Moderate cardiomegaly. Electronically Signed   By: Trudie Reed M.D.   On: 10/14/2019 14:03    Medications:   . diltiazem  60 mg Oral Q8H  . furosemide  80 mg Intravenous Q12H  . metoprolol tartrate  25 mg Oral BID  . warfarin  5 mg Oral ONCE-1800  . Warfarin - Pharmacist Dosing Inpatient   Does not apply q1800   Continuous Infusions: . sodium chloride    . diltiazem (CARDIZEM) infusion 5 mg/hr (10/15/19 0923)     LOS: 1 day   Joseph Art  [facility]   How to contact the [facility] Attending or Consulting provider 7A - 7P or covering provider during after hours 7P -7A, for this patient?  1. Check the care team in [facility] and look for a) attending/consulting [facility] provider listed and b) the [facility] team listed 2. Log into [email or web address] and use [place]'s universal password to access. If you do not have the password, please contact the hospital operator. 3. Locate the [facility] provider you are looking for under [facility] and page to a number that you can be directly reached. 4. If you still have difficulty reaching the provider, please page the [facility] (Director on Call) for the Hospitalists listed on amion for assistance.  10/15/2019, 11:55 AM

## 2019-10-15 NOTE — Progress Notes (Signed)
ReDS Clip Diuretic Study Pt study # [id number]  Your patient is in the Blinded arm of the ReDS Clip Diuretic study.  Your patient has had a ReDS reading and the reading has been transmitted to the cloud.     Thank You   The research team

## 2019-10-16 ENCOUNTER — Inpatient Hospital Stay (HOSPITAL_COMMUNITY): Payer: Medicare Other

## 2019-10-16 LAB — BASIC METABOLIC PANEL
Anion gap: 9 (ref 5–15)
BUN: 20 mg/dL (ref 8–23)
CO2: 25 mmol/L (ref 22–32)
Calcium: 8.6 mg/dL — ABNORMAL LOW (ref 8.9–10.3)
Chloride: 104 mmol/L (ref 98–111)
Creatinine, Ser: 1.29 mg/dL — ABNORMAL HIGH (ref 0.61–1.24)
GFR calc Af Amer: 59 mL/min — ABNORMAL LOW (ref >60)
GFR calc non Af Amer: 51 mL/min — ABNORMAL LOW (ref >60)
Glucose, Bld: 102 mg/dL — ABNORMAL HIGH (ref 70–99)
Potassium: 3.9 mmol/L (ref 3.5–5.1)
Sodium: 138 mmol/L (ref 135–145)

## 2019-10-16 LAB — CBC
HCT: 37.5 % — ABNORMAL LOW (ref 39.0–52.0)
Hemoglobin: 12.5 g/dL — ABNORMAL LOW (ref 13.0–17.0)
MCH: 30.5 pg (ref 26.0–34.0)
MCHC: 33.3 g/dL (ref 30.0–36.0)
MCV: 91.5 fL (ref 80.0–100.0)
Platelets: 248 10*3/uL (ref 150–400)
RBC: 4.1 MIL/uL — ABNORMAL LOW (ref 4.22–5.81)
RDW: 14.1 % (ref 11.5–15.5)
WBC: 8.9 10*3/uL (ref 4.0–10.5)
nRBC: 0 % (ref 0.0–0.2)

## 2019-10-16 LAB — PROTIME-INR
INR: 1.2 (ref 0.8–1.2)
Prothrombin Time: 14.7 seconds (ref 11.4–15.2)

## 2019-10-16 MED ORDER — WARFARIN SODIUM 7.5 MG PO TABS
7.5000 mg | ORAL_TABLET | Freq: Once | ORAL | Status: AC
  Administered 2019-10-16: 18:00:00 7.5 mg via ORAL
  Filled 2019-10-16: qty 1

## 2019-10-16 MED ORDER — WARFARIN SODIUM 7.5 MG PO TABS: 7.5000 mg | ORAL_TABLET | Freq: Once | ORAL | Status: DC

## 2019-10-16 MED ORDER — NICOTINE 21 MG/24HR TD PT24
21.0000 mg | MEDICATED_PATCH | Freq: Every day | TRANSDERMAL | Status: DC
  Administered 2019-10-16 – 2019-10-18 (×3): 21 mg via TRANSDERMAL
  Filled 2019-10-16 (×3): qty 1

## 2019-10-16 NOTE — Progress Notes (Signed)
Progress Note    Alfred Mercado  IRJ:[id number] DOB: [birth year]  DOA: 10/14/2019 PCP: Patient, No Pcp Per    Brief Narrative:    Medical records reviewed and are as summarized below:  Alfred Mercado is an 84 y.o. male who had no known past medical history until 3 months ago when he presented to the ED with dyspnea and found to be in A. fib with RVR.  He was admitted to the teaching service where work-up revealed pulmonary vascular congestion with right-sided pleural effusion and EF of 50 to 55% with LVH.  Patient was treated with diuresis and discharged home on Lasix and Eliquis with no oxygen requirement.  Patient states that he did well as long as he was on his medications however he ran out of his medications 2 weeks ago.  He notes that he is unable to afford his medications "and one of them cost $2200".  Patient notes he slowly developed increasing dyspnea on exertion.  2 to 3 days ago he became short of breath at rest and today he was quite dizzy just sitting even without trying to exert himself.  Patient denies having chest pain.  He does admit to having orthopnea at baseline.  Not sure if has PND, notes he is not sleeping very much.  Assessment/Plan:   Principal Problem:   Dyspnea Active Problems:   Atrial fibrillation with rapid ventricular response (HCC)   Pleural effusion, right   Acute heart failure with preserved ejection fraction (HCC)   Atrial fibrillation with RVR (HCC)   Acute respiratory failure Likely in setting of decompensated heart failure with right moderate pleural effusion causing compressive atelectasis.  He also smokes a pack per day but does not have a formal COPD diagnosis. On 2 L oxygen via nasal cannula Incentive spirometer Given worsening renal function, diuretics on hold after received 1 dose of IV Lasix this morning Repeat chest x-ray to assess for interval change  AFIB WITH RVR  Now rate controlled Switched from IV diltiazem to p.o.  diltiazem Continue metoprolol 25 mg twice daily -In the ER, patient was started on warfarin per pharmacy consultation given ChadsVasc2 score of 2. Switch from Eliquis to warfarin due to cost issues.  Warfarin dosing per pharmacy  Acute on chronic diastolic CHF Echocardiogram done 07/11/2019 shows EF estimated to be 50 to 55% with moderate LVH. As patient was in A. fib they could not comment on diastolic function. Follow daily weights, strict I's and O's Hold diuretics due to worsening renal function  Right pleural effusion with compressive atelectasis Patient underwent 1.5 L thoracentesis on last admission, was found to have transudative fluid. Repeat chest x-ray to assess interval change with diuretics.  Consider thoracentesis in case of worsening effusion.  Tobacco abuse Patient motivated to quit Nicotine replacement ordered   Family Communication/Anticipated D/C date and plan/Code Status   DVT prophylaxis: Patient was on Eliquis but could not afford it so warfarin ordered, not sure how well patient will do with this as he is not compliant with follow-up nor does he have anyone to follow-up with Code Status: Full Code.  Disposition Plan: Patient states he lives in a motel.  Per patient he has no family.  Per chart it says he has no PCP or insurance.  TOC consult for safe discharge plan.   Medical Consultants:    None.   Subjective:   Feeling much better overall.  Breathing is better.  Objective:    Vitals:   10/16/19 0734  10/16/19 0736 10/16/19 1306 10/16/19 1610  BP: (!) 77/21 (!) 112/52 111/73 107/63  Pulse: 69 60 68 67  Resp: 18 18 18 18   Temp: 97.7 F (36.5 C) 98 F (36.7 C) 97.9 F (36.6 C) 98 F (36.7 C)  TempSrc: Oral Oral Oral Oral  SpO2: 90%  96% 96%  Weight:      Height:        Intake/Output Summary (Last 24 hours) at 10/16/2019 1801 Last data filed at 10/16/2019 1600 Gross per 24 hour  Intake 1084.86 ml  Output 2500 ml  Net -1415.14 ml    Filed Weights   10/14/19 1319 10/15/19 0411 10/16/19 0527  Weight: 95.3 kg 92.1 kg 88.8 kg    Exam: Lying in bed on 2 L nasal cannula No increased work of breathing, no accessory muscle use Decrease in breath sounds on the right side No wheezing Irregular but rate controlled Positive bowel sounds soft nontender Alert and oriented x3  Data Reviewed:   I have personally reviewed following labs and imaging studies:  Labs: Labs show the following:   Basic Metabolic Panel: Recent Labs  Lab 10/14/19 1330 10/14/19 1330 10/15/19 0429 10/16/19 0748  NA 139  --  138 138  K 4.0   < > 4.0 3.9  CL 107  --  107 104  CO2 21*  --  24 25  GLUCOSE 131*  --  105* 102*  BUN 12  --  15 20  CREATININE 1.13  --  1.24 1.29*  CALCIUM 8.8*  --  8.7* 8.6*   < > = values in this interval not displayed.   GFR Estimated Creatinine Clearance: 51.3 mL/min (A) (by C-G formula based on SCr of 1.29 mg/dL (H)). Liver Function Tests: Recent Labs  Lab 10/14/19 1330  AST 26  ALT 29  ALKPHOS 84  BILITOT 0.8  PROT 5.9*  ALBUMIN 3.4*   No results for input(s): LIPASE, AMYLASE in the last 168 hours. No results for input(s): AMMONIA in the last 168 hours. Coagulation profile Recent Labs  Lab 10/14/19 1330 10/15/19 0429 10/16/19 0748  INR 1.2 1.1 1.2    CBC: Recent Labs  Lab 10/14/19 1330 10/15/19 0429 10/16/19 0748  WBC 8.4 8.3 8.9  NEUTROABS 6.7  --   --   HGB 12.6* 11.8* 12.5*  HCT 39.1 35.8* 37.5*  MCV 93.8 92.7 91.5  PLT 245 231 248   Cardiac Enzymes: No results for input(s): CKTOTAL, CKMB, CKMBINDEX, TROPONINI in the last 168 hours. BNP (last 3 results) No results for input(s): PROBNP in the last 8760 hours. CBG: Recent Labs  Lab 10/14/19 1744  GLUCAP 109*   D-Dimer: No results for input(s): DDIMER in the last 72 hours. Hgb A1c: No results for input(s): HGBA1C in the last 72 hours. Lipid Profile: No results for input(s): CHOL, HDL, LDLCALC, TRIG, CHOLHDL,  LDLDIRECT in the last 72 hours. Thyroid function studies: No results for input(s): TSH, T4TOTAL, T3FREE, THYROIDAB in the last 72 hours.  Invalid input(s): FREET3 Anemia work up: No results for input(s): VITAMINB12, FOLATE, FERRITIN, TIBC, IRON, RETICCTPCT in the last 72 hours. Sepsis Labs: Recent Labs  Lab 10/14/19 1330 10/15/19 0429 10/16/19 0748  WBC 8.4 8.3 8.9    Microbiology Recent Results (from the past 240 hour(s))  SARS CORONAVIRUS 2 (TAT 6-24 HRS) Nasopharyngeal Nasopharyngeal Swab     Status: None   Collection Time: 10/14/19  5:18 PM   Specimen: Nasopharyngeal Swab  Result Value Ref Range Status  SARS Coronavirus 2 NEGATIVE NEGATIVE Final    Comment: (NOTE) SARS-CoV-2 target nucleic acids are NOT DETECTED. The SARS-CoV-2 RNA is generally detectable in upper and lower respiratory specimens during the acute phase of infection. Negative results do not preclude SARS-CoV-2 infection, do not rule out co-infections with other pathogens, and should not be used as the sole basis for treatment or other patient management decisions. Negative results must be combined with clinical observations, patient history, and epidemiological information. The expected result is Negative. Fact Sheet for Patients: HairSlick.no Fact Sheet for Healthcare Providers: quierodirigir.com This test is not yet approved or cleared by the [place] FDA and  has been authorized for detection and/or diagnosis of SARS-CoV-2 by FDA under an Emergency Use Authorization (EUA). This EUA will remain  in effect (meaning this test can be used) for the duration of the COVID-19 declaration under Section 56 4(b)(1) of the Act, 21 U.S.C. section 360bbb-3(b)(1), unless the authorization is terminated or revoked sooner. Performed at [facility], [address] [place], [place] [address]     Procedures and diagnostic studies:  CT CHEST WO  CONTRAST  Result Date: 10/14/2019 CLINICAL DATA:  84 year old male with shortness of breath and tachycardia. Right pleural effusion versus consolidation. EXAM: CT CHEST WITHOUT CONTRAST TECHNIQUE: Multidetector CT imaging of the chest was performed following the standard protocol without IV contrast. COMPARISON:  Portable chest earlier today. Chest CTA 07/11/2019. FINDINGS: Cardiovascular: Vascular patency is not evaluated in the absence of IV contrast. Calcified coronary artery and aortic atherosclerosis. Cardiomegaly appears stable since November. No pericardial effusion. Mediastinum/Nodes: Negative. No mediastinal lymphadenopathy. Lungs/Pleura: Moderate layering right pleural effusion, similar size to that in November. Simple fluid density suggesting a transudate. Superimposed compressive right lung atelectasis, a especially in the lower lobe. Upper lobe centrilobular emphysema more apparent on the right. The trachea through mainstem bronchi are patent although there is sub solid material in the right bronchus intermedius on series 8, image 85 which is new since November. In the left lung there is a small layering effusion which has decreased since November. Mild left lung atelectasis. No inflammatory appearing pulmonary opacity. Upper Abdomen: Stable visible noncontrast liver, gallbladder, spleen, pancreas and adrenal glands. Negative visible bowel in the upper abdomen. Musculoskeletal: Advanced degenerative changes in the thoracic and visible cervical spine. Stable small round sclerotic focus in the posterior right 8th rib, probably a benign bone island. No acute osseous abnormality identified. IMPRESSION: 1. Moderate layering right pleural effusion with compressive atelectasis, similar to that in November. A small left pleural effusion has decreased since November. 2. Evidence of retained secretions in the bronchus intermedius, but no pulmonary opacity suspicious for pneumonia. Emphysema (ICD10-J43.9). 3.  Cardiomegaly. Coronary artery and Aortic atherosclerosis (ICD10-I70.0). Electronically Signed   By: Odessa Fleming M.D.   On: 10/14/2019 20:22    Medications:    diltiazem  60 mg Oral Q8H   metoprolol tartrate  25 mg Oral BID   nicotine  21 mg Transdermal Daily   Warfarin - Pharmacist Dosing Inpatient   Does not apply q1800   Continuous Infusions:  sodium chloride       LOS: 2 days   Liborio Nixon MD [facility]   How to contact the [facility] Attending or Consulting provider 7A - 7P or covering provider during after hours 7P -7A, for this patient?  1. Check the care team in [facility] and look for a) attending/consulting [facility] provider listed and b) the [facility] team listed 2. Log into [email or web address]  and use [place]'s universal password to access. If you do not have the password, please contact the hospital operator. 3. Locate the [facility] provider you are looking for under [facility] and page to a number that you can be directly reached. 4. If you still have difficulty reaching the provider, please page the [facility] (Director on Call) for the Hospitalists listed on amion for assistance.  10/16/2019, 6:01 PM

## 2019-10-16 NOTE — Progress Notes (Addendum)
ANTICOAGULATION CONSULT NOTE  Pharmacy Consult for warfarin Indication: atrial fibrillation  No Known Allergies  Patient Measurements: Height: 6\' 2"  (188 cm) Weight: 195 lb 11.2 oz (88.8 kg)(scale c) IBW/kg (Calculated) : 82.2 Heparin Dosing Weight:   Vital Signs: Temp: 98 F (36.7 C) (02/19 0736) Temp Source: Oral (02/19 0736) BP: 112/52 (02/19 0736) Pulse Rate: 60 (02/19 0736)  Labs: Recent Labs    10/14/19 1330 10/14/19 1330 10/15/19 0429 10/16/19 0748  HGB 12.6*   < > 11.8* 12.5*  HCT 39.1  --  35.8* 37.5*  PLT 245  --  231 248  APTT 31  --   --   --   LABPROT 14.8  --  14.5 14.7  INR 1.2  --  1.1 1.2  CREATININE 1.13  --  1.24 1.29*   < > = values in this interval not displayed.    Estimated Creatinine Clearance: 51.3 mL/min (A) (by C-G formula based on SCr of 1.29 mg/dL (H)).  Assessment: 84 y/o male with new AF with RVR (CHADSVASC = 2 given HFrEF not HFpEF was studied at the time of CHADS2). He had Eliquis prescribed PTA, but has not taken this medication in at least 30 days due to cost. Pharmacy has been consulted to dose warfarin. No bridge therapy given relatively low clot risk.   INR still 1.2 after 5mg  x2. Eating 95-100%. H/H, plt stable. Will increase dose tonight.  Goal of Therapy:  INR 2-3 Monitor platelets by anticoagulation protocol: Yes   Plan:  Warfarin 7.5 mg PO x1  Monitor daily INR, CBC, plt Monitor for signs/symptoms of bleeding  Warfarin education done 2/18, AVS updated    , PharmD, BCPS, [facility] Clinical Pharmacist  Please check AMION for all [facility] Pharmacy phone numbers After 10:00 PM, call Main Pharmacy [phone]

## 2019-10-17 ENCOUNTER — Inpatient Hospital Stay (HOSPITAL_COMMUNITY): Payer: Medicare Other

## 2019-10-17 LAB — BODY FLUID CELL COUNT WITH DIFFERENTIAL
Eos, Fluid: 1 %
Lymphs, Fluid: 86 %
Monocyte-Macrophage-Serous Fluid: 7 % — ABNORMAL LOW (ref 50–90)
Neutrophil Count, Fluid: 6 % (ref 0–25)
Total Nucleated Cell Count, Fluid: 1260 cu mm — ABNORMAL HIGH (ref 0–1000)

## 2019-10-17 LAB — ALBUMIN, PLEURAL OR PERITONEAL FLUID: Albumin, Fluid: 1.9 g/dL

## 2019-10-17 LAB — BASIC METABOLIC PANEL
Anion gap: 12 (ref 5–15)
BUN: 23 mg/dL (ref 8–23)
CO2: 23 mmol/L (ref 22–32)
Calcium: 8.4 mg/dL — ABNORMAL LOW (ref 8.9–10.3)
Chloride: 102 mmol/L (ref 98–111)
Creatinine, Ser: 1.16 mg/dL (ref 0.61–1.24)
GFR calc Af Amer: >60 mL/min (ref >60)
GFR calc non Af Amer: 58 mL/min — ABNORMAL LOW (ref >60)
Glucose, Bld: 98 mg/dL (ref 70–99)
Potassium: 3.8 mmol/L (ref 3.5–5.1)
Sodium: 137 mmol/L (ref 135–145)

## 2019-10-17 LAB — LACTATE DEHYDROGENASE, PLEURAL OR PERITONEAL FLUID: LD, Fluid: 64 U/L — ABNORMAL HIGH (ref 3–23)

## 2019-10-17 LAB — LACTATE DEHYDROGENASE: LDH: 111 U/L (ref 98–192)

## 2019-10-17 LAB — ALBUMIN: Albumin: 3.3 g/dL — ABNORMAL LOW (ref 3.5–5.0)

## 2019-10-17 LAB — PROTEIN, TOTAL: Total Protein: 5.8 g/dL — ABNORMAL LOW (ref 6.5–8.1)

## 2019-10-17 LAB — PROTIME-INR
INR: 1.2 (ref 0.8–1.2)
Prothrombin Time: 15.1 seconds (ref 11.4–15.2)

## 2019-10-17 LAB — PROTEIN, PLEURAL OR PERITONEAL FLUID: Total protein, fluid: <3 g/dL

## 2019-10-17 LAB — MAGNESIUM: Magnesium: 1.9 mg/dL (ref 1.7–2.4)

## 2019-10-17 MED ORDER — FUROSEMIDE 40 MG PO TABS
40.0000 mg | ORAL_TABLET | Freq: Two times a day (BID) | ORAL | Status: DC
  Administered 2019-10-17: 40 mg via ORAL
  Filled 2019-10-17 (×2): qty 1

## 2019-10-17 MED ORDER — LIDOCAINE HCL (PF) 1 % IJ SOLN
INTRAMUSCULAR | Status: AC
  Filled 2019-10-17: qty 30

## 2019-10-17 MED ORDER — DILTIAZEM HCL ER COATED BEADS 180 MG PO CP24: 180.0000 mg | ORAL_CAPSULE | Freq: Every day | ORAL | Status: DC

## 2019-10-17 MED ORDER — WARFARIN SODIUM 7.5 MG PO TABS
7.5000 mg | ORAL_TABLET | Freq: Once | ORAL | Status: AC
  Administered 2019-10-17: 7.5 mg via ORAL
  Filled 2019-10-17: qty 1

## 2019-10-17 MED ORDER — DILTIAZEM HCL ER COATED BEADS 120 MG PO CP24: 120.0000 mg | ORAL_CAPSULE | Freq: Every day | ORAL | Status: DC

## 2019-10-17 MED ORDER — FUROSEMIDE 40 MG PO TABS
40.0000 mg | ORAL_TABLET | Freq: Every day | ORAL | Status: DC
  Administered 2019-10-18: 08:00:00 40 mg via ORAL
  Filled 2019-10-17: qty 1

## 2019-10-17 NOTE — Progress Notes (Signed)
ANTICOAGULATION CONSULT NOTE  Pharmacy Consult for warfarin Indication: atrial fibrillation  No Known Allergies  Patient Measurements: Height: 6\' 2"  (188 cm) Weight: 198 lb 3.2 oz (89.9 kg)(scale c) IBW/kg (Calculated) : 82.2 Heparin Dosing Weight:   Vital Signs: Temp: 97.9 F (36.6 C) (02/20 0357) Temp Source: Oral (02/20 0357) BP: 105/58 (02/20 0357) Pulse Rate: 61 (02/20 0357)  Labs: Recent Labs    10/14/19 1330 10/14/19 1330 10/15/19 0429 10/16/19 0748 10/17/19 0458  HGB 12.6*   < > 11.8* 12.5*  --   HCT 39.1  --  35.8* 37.5*  --   PLT 245  --  231 248  --   APTT 31  --   --   --   --   LABPROT 14.8   < > 14.5 14.7 15.1  INR 1.2   < > 1.1 1.2 1.2  CREATININE 1.13   < > 1.24 1.29* 1.16   < > = values in this interval not displayed.    Estimated Creatinine Clearance: 57.1 mL/min (by C-G formula based on SCr of 1.16 mg/dL).  Assessment: 84 y/o male with new AF with RVR (CHADSVASC = 2 given HFrEF not HFpEF was studied at the time of CHADS2). He had Eliquis prescribed PTA, but has not taken this medication in at least 30 days due to cost. Pharmacy has been consulted to dose warfarin. No bridge therapy given relatively low clot risk.   INR still subtherapeutic after increasing dose last night. Received three doses of warfarin so far. CBC has been stable. No overt bleeding noted. Will give one more dose of 7.5 mg PO tonight with plans to increase tomorrow if still no change.  Goal of Therapy:  INR 2-3 Monitor platelets by anticoagulation protocol: Yes   Plan:  Warfarin 7.5 mg PO x1  Monitor daily INR, CBC, plt Monitor for signs/symptoms of bleeding  Warfarin education done 2/18, AVS updated    3/18, PharmD PGY1 Pharmacy Resident Phone: [phone] 10/17/2019  8:54 AM  Please check [email or web address] for unit-specific pharmacy phone numbers.

## 2019-10-17 NOTE — Procedures (Signed)
Ultrasound-guided diagnostic and therapeutic right thoracentesis performed yielding 1.4 liters of yellow fluid. No immediate complications. Follow-up chest x-ray pending. A portion of the fluid was sent to the lab for preordered studies. EBL< 1 cc.

## 2019-10-17 NOTE — Progress Notes (Signed)
Progress Note    Alfred Mercado  KWI:[id number] DOB: Aug 25, 1936  DOA: 10/14/2019 PCP: Patient, No Pcp Per    Brief Narrative:    Medical records reviewed and are as summarized below:  Alfred Mercado is an 84 y.o. male who had no known past medical history until 3 months ago when he presented to the ED with dyspnea and found to be in A. fib with RVR.  He was admitted to the teaching service where work-up revealed pulmonary vascular congestion with right-sided pleural effusion and EF of 50 to 55% with LVH.  Patient was treated with diuresis and discharged home on Lasix and Eliquis with no oxygen requirement.  Patient states that he did well as long as he was on his medications however he ran out of his medications 2 weeks ago.  He notes that he is unable to afford his medications "and one of them cost $2200".  Patient notes he slowly developed increasing dyspnea on exertion.  2 to 3 days ago he became short of breath at rest and today he was quite dizzy just sitting even without trying to exert himself.  Patient denies having chest pain.  He does admit to having orthopnea at baseline.  Not sure if has PND, notes he is not sleeping very much.  Assessment/Plan:   Principal Problem:   Dyspnea Active Problems:   Atrial fibrillation with rapid ventricular response (HCC)   Pleural effusion, right   Acute heart failure with preserved ejection fraction (HCC)   Atrial fibrillation with RVR (HCC)   Acute respiratory failure Likely in setting of decompensated heart failure with right moderate pleural effusion causing compressive atelectasis.  He also smokes a pack per day but does not have a formal COPD diagnosis. On 2 L oxygen via nasal cannula. Incentive spirometer Switch to p.o. Lasix on 2/20.  Dose decreased from 40 mg twice daily to 40 mg daily due to borderline low blood pressures Given persistent right pleural effusion despite diuresis, ultrasound-guided thoracentesis ordered.  S/p  thoracentesis with no acute complications on 2/20.  Fluid studies ordered, pending.  AFIB WITH RVR  Now rate controlled Switched from IV diltiazem to p.o. diltiazem.  Switch from short-acting diltiazem to XR starting tomorrow. Continue metoprolol 25 mg twice daily -In the ER, patient was started on warfarin per pharmacy consultation given ChadsVasc2 score of 2. Switch from Eliquis to warfarin due to cost issues.  Warfarin dosing per pharmacy.  Discussed with patient today that he would need frequent INR checks while on Coumadin and therefore will need a PCP.  Acute on chronic diastolic CHF Echocardiogram done 07/11/2019 shows EF estimated to be 50 to 55% with moderate LVH. As patient was in A. fib they could not comment on diastolic function. Follow daily weights, strict I's and O's Switch to p.o. diuretics  Right pleural effusion with compressive atelectasis Patient underwent 1.5 L thoracentesis on last admission, was found to have transudative fluid. Repeat chest x-ray after diuresis shows persistent right pleural effusion.  S/p thoracentesis as noted above.  Tobacco abuse Patient motivated to quit Nicotine replacement ordered   Family Communication/Anticipated D/C date and plan/Code Status   DVT prophylaxis: Patient was on Eliquis but could not afford it so warfarin ordered, not sure how well patient will do.  Had an extensive discussion with the patient regarding importance of medication compliance and PCP follow-up.  He is in agreement and will try his best to follow-up with primary care though he does acknowledge that he  does not have insurance. Code Status: Full Code.  Disposition Plan: Patient states he lives in a motel.  Per patient he has no family.  Per chart it says he has no PCP or insurance.  TOC consult for safe discharge plan.  Tentative plan for discharge tomorrow if remains stable.  PT consulted to ensure he does not get hypoxic while ambulating.    Medical  Consultants:    IR for thoracentesis   Subjective:   Feeling much better overall.  Breathing is better.    Objective:    Vitals:   10/17/19 1218 10/17/19 1223 10/17/19 1657 10/17/19 1659  BP: 110/88 110/74  97/73  Pulse:    78  Resp:      Temp:    97.8 F (36.6 C)  TempSrc:    Oral  SpO2: 100% 100% 96% 96%  Weight:      Height:        Intake/Output Summary (Last 24 hours) at 10/17/2019 1710 Last data filed at 10/17/2019 1300 Gross per 24 hour  Intake 720 ml  Output 850 ml  Net -130 ml   Filed Weights   10/15/19 0411 10/16/19 0527 10/17/19 0357  Weight: 92.1 kg 88.8 kg 89.9 kg    Exam: Lying in bed on 2 L nasal cannula No increased work of breathing, no accessory muscle use Decrease in breath sounds on the right side, somewhat improved No wheezing Irregular but rate controlled Positive bowel sounds soft nontender Alert and oriented x3 Of note, oxygen discontinued and repeat pulse ox 96% on room air at rest.  Data Reviewed:   I have personally reviewed following labs and imaging studies:  Labs: Labs show the following:   Basic Metabolic Panel: Recent Labs  Lab 10/14/19 1330 10/14/19 1330 10/15/19 0429 10/15/19 0429 10/16/19 0748 10/17/19 0458  NA 139  --  138  --  138 137  K 4.0   < > 4.0   < > 3.9 3.8  CL 107  --  107  --  104 102  CO2 21*  --  24  --  25 23  GLUCOSE 131*  --  105*  --  102* 98  BUN 12  --  15  --  20 23  CREATININE 1.13  --  1.24  --  1.29* 1.16  CALCIUM 8.8*  --  8.7*  --  8.6* 8.4*  MG  --   --   --   --   --  1.9   < > = values in this interval not displayed.   GFR Estimated Creatinine Clearance: 57.1 mL/min (by C-G formula based on SCr of 1.16 mg/dL). Liver Function Tests: Recent Labs  Lab 10/14/19 1330  AST 26  ALT 29  ALKPHOS 84  BILITOT 0.8  PROT 5.9*  ALBUMIN 3.4*   No results for input(s): LIPASE, AMYLASE in the last 168 hours. No results for input(s): AMMONIA in the last 168 hours. Coagulation  profile Recent Labs  Lab 10/14/19 1330 10/15/19 0429 10/16/19 0748 10/17/19 0458  INR 1.2 1.1 1.2 1.2    CBC: Recent Labs  Lab 10/14/19 1330 10/15/19 0429 10/16/19 0748  WBC 8.4 8.3 8.9  NEUTROABS 6.7  --   --   HGB 12.6* 11.8* 12.5*  HCT 39.1 35.8* 37.5*  MCV 93.8 92.7 91.5  PLT 245 231 248   Cardiac Enzymes: No results for input(s): CKTOTAL, CKMB, CKMBINDEX, TROPONINI in the last 168 hours. BNP (last 3 results) No results for input(s):  PROBNP in the last 8760 hours. CBG: Recent Labs  Lab 10/14/19 1744  GLUCAP 109*   D-Dimer: No results for input(s): DDIMER in the last 72 hours. Hgb A1c: No results for input(s): HGBA1C in the last 72 hours. Lipid Profile: No results for input(s): CHOL, HDL, LDLCALC, TRIG, CHOLHDL, LDLDIRECT in the last 72 hours. Thyroid function studies: No results for input(s): TSH, T4TOTAL, T3FREE, THYROIDAB in the last 72 hours.  Invalid input(s): FREET3 Anemia work up: No results for input(s): VITAMINB12, FOLATE, FERRITIN, TIBC, IRON, RETICCTPCT in the last 72 hours. Sepsis Labs: Recent Labs  Lab 10/14/19 1330 10/15/19 0429 10/16/19 0748  WBC 8.4 8.3 8.9    Microbiology Recent Results (from the past 240 hour(s))  SARS CORONAVIRUS 2 (TAT 6-24 HRS) Nasopharyngeal Nasopharyngeal Swab     Status: None   Collection Time: 10/14/19  5:18 PM   Specimen: Nasopharyngeal Swab  Result Value Ref Range Status   SARS Coronavirus 2 NEGATIVE NEGATIVE Final    Comment: (NOTE) SARS-CoV-2 target nucleic acids are NOT DETECTED. The SARS-CoV-2 RNA is generally detectable in upper and lower respiratory specimens during the acute phase of infection. Negative results do not preclude SARS-CoV-2 infection, do not rule out co-infections with other pathogens, and should not be used as the sole basis for treatment or other patient management decisions. Negative results must be combined with clinical observations, patient history, and epidemiological  information. The expected result is Negative. Fact Sheet for Patients: HairSlick.no Fact Sheet for Healthcare Providers: quierodirigir.com This test is not yet approved or cleared by the [place] FDA and  has been authorized for detection and/or diagnosis of SARS-CoV-2 by FDA under an Emergency Use Authorization (EUA). This EUA will remain  in effect (meaning this test can be used) for the duration of the COVID-19 declaration under Section 56 4(b)(1) of the Act, 21 U.S.C. section 360bbb-3(b)(1), unless the authorization is terminated or revoked sooner. Performed at [facility], [address], [place], [place] [address]     Procedures and diagnostic studies:  DG Chest 1 View  Result Date: 10/17/2019 CLINICAL DATA:  84 year old male status post right thoracentesis. EXAM: CHEST  1 VIEW COMPARISON:  Chest x-ray 10/16/2019. FINDINGS: Previously noted right pleural effusion has decreased, currently small. This may be partially loculated laterally. No appreciable right pneumothorax. Bibasilar opacities which may reflect areas of atelectasis and/or consolidation. No left pleural effusion. No evidence of pulmonary edema. Heart size is mildly enlarged. Upper mediastinal contours are within normal limits. IMPRESSION: 1. Decreased right-sided pleural effusion following thoracentesis. No pneumothorax or other acute complicating features. 2. Bibasilar opacities which may reflect areas of atelectasis and/or consolidation. Electronically Signed   By: Trudie Reed M.D.   On: 10/17/2019 12:43   DG Chest 1 View  Result Date: 10/16/2019 CLINICAL DATA:  Chest tightness. Pleural effusion. EXAM: CHEST  1 VIEW COMPARISON:  Radiographs and CT 10/14/2019 FINDINGS: Moderate right pleural effusion, not significantly changed from prior exam. Pleural fluid occupies the lower 1/2 to 1/3 of right hemithorax. Cardiomegaly is unchanged. Small left pleural  effusion on CT is not well seen radiographically. No new airspace disease. No pneumothorax. IMPRESSION: No significant change in radiographic appearance of the chest over the past 2 days. Stable moderate right pleural effusion. Stable cardiomegaly. Small left pleural effusion on CT is not well seen radiographically. Electronically Signed   By: Narda Rutherford M.D.   On: 10/16/2019 19:48   US THORACENTESIS ASP PLEURAL SPACE W/IMG GUIDE  Result Date: 10/17/2019  INDICATION: Patient with history of tobacco abuse, heart failure, dyspnea, recurrent right pleural effusion. Request made for diagnostic and therapeutic right thoracentesis. EXAM: ULTRASOUND GUIDED DIAGNOSTIC AND THERAPEUTIC RIGHT THORACENTESIS MEDICATIONS: None COMPLICATIONS: None immediate. PROCEDURE: An ultrasound guided thoracentesis was thoroughly discussed with the patient and questions answered. The benefits, risks, alternatives and complications were also discussed. The patient understands and wishes to proceed with the procedure. Written consent was obtained. Ultrasound was performed to localize and mark an adequate pocket of fluid in the right chest. The area was then prepped and draped in the normal sterile fashion. 1% Lidocaine was used for local anesthesia. Under ultrasound guidance a 6 Fr Safe-T-Centesis catheter was introduced. Thoracentesis was performed. The catheter was removed and a dressing applied. FINDINGS: A total of approximately 1.4 liters of yellow fluid was removed. Samples were sent to the laboratory as requested by the clinical team. IMPRESSION: Successful ultrasound guided diagnostic and therapeutic right thoracentesis yielding 1.4 liters of pleural fluid. Read by: Rowe Robert, PA-C Electronically Signed   By: Jerilynn Mages.  Shick M.D.   On: 10/17/2019 12:33    Medications:   . diltiazem  60 mg Oral Q8H  . [START ON 10/18/2019] furosemide  40 mg Oral Daily  . metoprolol tartrate  25 mg Oral BID  . nicotine  21 mg Transdermal  Daily  . warfarin  7.5 mg Oral ONCE-1800  . Warfarin - Pharmacist Dosing Inpatient   Does not apply q1800   Continuous Infusions: . sodium chloride       LOS: 3 days   Lucky Cowboy MD [facility]   How to contact the [facility] Attending or Consulting provider [place] or covering provider during after hours [place], for this patient?  1. Check the care team in [facility] and look for a) attending/consulting [facility] provider listed and b) the [facility] team listed 2. Log into [email or web address] and use [place]'s universal password to access. If you do not have the password, please contact the hospital operator. 3. Locate the [facility] provider you are looking for under [facility] and page to a number that you can be directly reached. 4. If you still have difficulty reaching the provider, please page the [facility] (Director on Call) for the Hospitalists listed on amion for assistance.  10/17/2019, 5:10 PM

## 2019-10-18 LAB — GRAM STAIN

## 2019-10-18 LAB — CBC
HCT: 37.2 % — ABNORMAL LOW (ref 39.0–52.0)
Hemoglobin: 12.7 g/dL — ABNORMAL LOW (ref 13.0–17.0)
MCH: 30.5 pg (ref 26.0–34.0)
MCHC: 34.1 g/dL (ref 30.0–36.0)
MCV: 89.4 fL (ref 80.0–100.0)
Platelets: 231 10*3/uL (ref 150–400)
RBC: 4.16 MIL/uL — ABNORMAL LOW (ref 4.22–5.81)
RDW: 13.8 % (ref 11.5–15.5)
WBC: 8.8 10*3/uL (ref 4.0–10.5)
nRBC: 0 % (ref 0.0–0.2)

## 2019-10-18 LAB — PROTIME-INR
INR: 1.3 — ABNORMAL HIGH (ref 0.8–1.2)
Prothrombin Time: 16.2 seconds — ABNORMAL HIGH (ref 11.4–15.2)

## 2019-10-18 MED ORDER — NICOTINE 21 MG/24HR TD PT24: 21.0000 mg | MEDICATED_PATCH | Freq: Every day | TRANSDERMAL | 0 refills | Status: DC

## 2019-10-18 MED ORDER — WARFARIN SODIUM 10 MG PO TABS: 10.0000 mg | ORAL_TABLET | Freq: Every day | ORAL | 0 refills | Status: DC

## 2019-10-18 MED ORDER — WARFARIN SODIUM 10 MG PO TABS: 10.0000 mg | ORAL_TABLET | Freq: Once | ORAL | Status: DC

## 2019-10-18 MED ORDER — METOPROLOL TARTRATE 25 MG PO TABS: 25.0000 mg | ORAL_TABLET | Freq: Two times a day (BID) | ORAL | 2 refills | Status: DC

## 2019-10-18 MED ORDER — FUROSEMIDE 40 MG PO TABS: 40.0000 mg | ORAL_TABLET | Freq: Every day | ORAL | 2 refills | Status: DC

## 2019-10-18 MED ORDER — DILTIAZEM HCL ER COATED BEADS 180 MG PO CP24: 180.0000 mg | ORAL_CAPSULE | Freq: Every day | ORAL | 2 refills | Status: DC

## 2019-10-18 MED ORDER — DILTIAZEM HCL ER COATED BEADS 180 MG PO CP24
180.0000 mg | ORAL_CAPSULE | Freq: Every day | ORAL | Status: DC
  Administered 2019-10-18: 08:00:00 180 mg via ORAL
  Filled 2019-10-18: qty 1

## 2019-10-18 NOTE — Progress Notes (Signed)
ANTICOAGULATION CONSULT NOTE  Pharmacy Consult for warfarin Indication: atrial fibrillation  No Known Allergies  Patient Measurements: Height: 6\' 2"  (188 cm) Weight: 194 lb 9.6 oz (88.3 kg)(scale c) IBW/kg (Calculated) : 82.2 Heparin Dosing Weight:   Vital Signs: Temp: 97.9 F (36.6 C) (02/21 0450) Temp Source: Oral (02/21 0450) BP: 109/75 (02/21 0822) Pulse Rate: 96 (02/21 0822)  Labs: Recent Labs    10/16/19 0748 10/17/19 0458 10/18/19 0718  HGB 12.5*  --  12.7*  HCT 37.5*  --  37.2*  PLT 248  --  231  LABPROT 14.7 15.1 16.2*  INR 1.2 1.2 1.3*  CREATININE 1.29* 1.16  --     Estimated Creatinine Clearance: 57.1 mL/min (by C-G formula based on SCr of 1.16 mg/dL).  Assessment: 84 y/o male with new AF with RVR (CHADSVASC = 2 given HFrEF not HFpEF was studied at the time of CHADS2). He had Eliquis prescribed PTA, but has not taken this medication in at least 30 days due to cost. Pharmacy has been consulted to dose warfarin. No bridge therapy given relatively low clot risk.   INR still subtherapeutic after fourth dose last night. CBC has been stable. No overt bleeding noted.   Goal of Therapy:  INR 2-3 Monitor platelets by anticoagulation protocol: Yes   Plan:  Warfarin 10 mg PO x1  Monitor daily INR, CBC, plt Monitor for signs/symptoms of bleeding  Warfarin education done 2/18, AVS updated    3/18, PharmD PGY1 Pharmacy Resident Phone: [phone] 10/18/2019  9:03 AM  Please check [email or web address] for unit-specific pharmacy phone numbers.

## 2019-10-18 NOTE — Evaluation (Signed)
Physical Therapy Evaluation Patient Details Name: Alfred Mercado MRN: [id number] DOB: [birth year] Today's Date: 10/18/2019   History of Present Illness  Pt is an 84 y/o male admitted secondary to SOB found to have pleural effusion  PMH includes:a-fib with RVR and heart failure.  Clinical Impression  Patient received in bed, agrees to PT assessment, wants to go home today. Reports he is breathing better. Sats at rest prior to activity at 97% on room air. Patient is independent with bed mobility, transfers with supervision. He was able to ambulate 150 feet with min guard, no AD. O2 Sats remained > 90% throughout session. Patient did have reported mild SOB after walking.  Patient will continue to benefit from skilled PT while here to improve activity tolerance and strengthening for safe return home.     Follow Up Recommendations No PT follow up    Equipment Recommendations  None recommended by PT    Recommendations for Other Services       Precautions / Restrictions Precautions Precaution Comments: mod fall Restrictions Weight Bearing Restrictions: No      Mobility  Bed Mobility Overal bed mobility: Modified Independent             General bed mobility comments: use of rails  Transfers Overall transfer level: Needs assistance Equipment used: None Transfers: Sit to/from Stand Sit to Stand: Supervision            Ambulation/Gait Ambulation/Gait assistance: Min guard Gait Distance (Feet): 150 Feet Assistive device: None Gait Pattern/deviations: Step-through pattern;Decreased stride length;Narrow base of support;Drifts right/left Gait velocity: decr   General Gait Details: O2 sats during ambulation were > 90% throughout session  Stairs            Wheelchair Mobility    Modified Rankin (Stroke Patients Only)       Balance Overall balance assessment: Mild deficits observed, not formally tested                                            Pertinent Vitals/Pain Pain Assessment: No/denies pain    Home Living Family/patient expects to be discharged to:: Other (Comment)(motel) Living Arrangements: Alone                    Prior Function Level of Independence: Independent with assistive device(s)         Comments: ambulates with use of a cane PRN     Hand Dominance        Extremity/Trunk Assessment   Upper Extremity Assessment Upper Extremity Assessment: Overall WFL for tasks assessed    Lower Extremity Assessment Lower Extremity Assessment: Generalized weakness    Cervical / Trunk Assessment Cervical / Trunk Assessment: Normal  Communication   Communication: HOH  Cognition Arousal/Alertness: Awake/alert Behavior During Therapy: WFL for tasks assessed/performed Overall Cognitive Status: Within Functional Limits for tasks assessed                                        General Comments      Exercises     Assessment/Plan    PT Assessment Patient needs continued PT services  PT Problem List Decreased strength;Decreased activity tolerance;Decreased balance;Decreased mobility;Cardiopulmonary status limiting activity       PT Treatment Interventions Therapeutic exercise;Gait training;Balance training;Functional mobility training;Therapeutic activities;Patient/family  education    PT Goals (Current goals can be found in the Care Plan section)  Acute Rehab PT Goals Patient Stated Goal: to return home today PT Goal Formulation: With patient Time For Goal Achievement: 10/25/19 Potential to Achieve Goals: Good    Frequency Min 2X/week   Barriers to discharge Decreased caregiver support      Co-evaluation               AM-PAC PT "6 Clicks" Mobility  Outcome Measure Help needed turning from your back to your side while in a flat bed without using bedrails?: None Help needed moving from lying on your back to sitting on the side of a flat bed without using  bedrails?: None Help needed moving to and from a bed to a chair (including a wheelchair)?: A Little Help needed standing up from a chair using your arms (e.g., wheelchair or bedside chair)?: A Little Help needed to walk in hospital room?: A Little Help needed climbing 3-5 steps with a railing? : A Little 6 Click Score: 20    End of Session Equipment Utilized During Treatment: Gait belt Activity Tolerance: Patient tolerated treatment well Patient left: in bed;with call bell/phone within reach Nurse Communication: Mobility status PT Visit Diagnosis: Difficulty in walking, not elsewhere classified (R26.2);Muscle weakness (generalized) (M62.81);Unsteadiness on feet (R26.81)    Time: 2831-5176 PT Time Calculation (min) (ACUTE ONLY): 17 min   Charges:   PT Evaluation $PT Eval Moderate Complexity: 1 Mod PT Treatments $Gait Training: 8-22 mins        Navin Dogan, PT, GCS 10/18/19,10:08 AM

## 2019-10-18 NOTE — Progress Notes (Signed)
ReDS Clip Diuretic Study Pt study # [id number]  Your patient is in the Blinded arm of the ReDS Clip Diuretic study.  Your patient has had a ReDS reading and the reading has been transmitted to the cloud.   Thank You   The research team   Danae Orleans, PharmD, [facility] PGY2 Cardiology Pharmacy Resident Phone [phone] 10/18/2019       10:24 AM  Please check [email or web address] for unit-specific pharmacist phone numbers

## 2019-10-18 NOTE — Progress Notes (Signed)
Received referral to assist pt with his prescriptions. Pt doesn't have any insurance and he doesn't have a PCP. Met with pt. He reports that he lives alone in a motel. He stated that he has the support of his neighbors if needed. He hasn't seen a doctor in 30 years; however, we assisted him with his prescriptions 06/2019 through the [facility] program. Assisted pt again with his prescriptions (override). Provided pt with the [facility] letter. Also, provided pt with a brochure for the [facility] and I encouraged pt to call the center in the morning to make a f/u appointment. Explained to pt the importance of seen a doctor after being D/C from the hospital. He verbalized understanding. Pt needs assistance with transportation. He reports that the taxi can take him to [facility] to pick up his prescriptions and he can call a friend to pick him up from the drugstore. Provided RN with a taxi voucher.

## 2019-10-18 NOTE — Progress Notes (Signed)
Pt refused morning lab draw. Per Lab, they will try again later

## 2019-10-18 NOTE — Discharge Summary (Addendum)
Physician Discharge Summary  Alfred Mercado YPP:[id number] DOB: [birth year] DOA: 10/14/2019  PCP: Patient, No Pcp Per  Admit date: 10/14/2019 Discharge date: 10/18/2019  Admitted From: Home Disposition:  home  Recommendations for Outpatient Follow-up:   1. Started on Coumadin for atrial fibrillation; INR goal 2-3.  Last INR on 10/18/2019 subtherapeutic at 1.3.  Received 5 mg Coumadin on 2/18, 7.5 mg on 2/19 & 2/20.  Advised to take 10 mg daily warfarin from 2/21.  Recommend INR check at earliest possible.  2. Started on Lasix 40 mg daily.  Advised to take 40 mg extra if he gains 2 to 3 pounds a day or 5 pounds a week.  Monitor volume status and uptitrate dose as necessary.  3. Started on diltiazem XR 180 mg, metoprolol tartrate 25 mg twice daily for rate control.  4. Prescribed nicotine patches.  Patient motivated to quit.  Encourage continued cessation.  5. Pleural fluid transudative.  Gram stain negative for organisms.  Culture no growth for 1 day.  Final cultures pending.  Home Health: No Equipment/Devices:none    Discharge Condition: Stable CODE STATUS: Full code Diet recommendation: cardiac diet   Brief/Interim Summary: 84 year old Caucasian male who has not seen a PCP in 30 years with no known past medical history until 3 months ago when he was found to be in A. fib with RVR and volume overloaded.  At the time he was discharged on Lasix and Eliquis through the [facility] program but was unable to get refills due to lack of PCP.  At the time he had ultrasound-guided thoracentesis of his right pleural effusion that was transudative.  Echocardiogram on 06/2019 showed LVEF of 50 to 55% with moderately increased LVH, diastolic function could not be evaluated.  He presented again with worsening dyspnea and was found to be in A. fib with RVR and volume overloaded.  Echocardiogram was not repeated during this hospitalization and volume overload was presumed to be in setting of decompensated  diastolic heart failure.  Initially placed on diltiazem drip and later switched to p.o. diltiazem and metoprolol tartrate.  Started on Coumadin for anticoagulation with a CHA2DS2-VASc 2 score of 2 as he stopped taking Eliquis due to cost issues.  Recurrent right pleural effusion on admission did not improve with diuresis.  Thoracentesis performed with 1.5 L transudate of fluid removed.  Required 2 L of oxygen via nasal cannula and was ultimately weaned off to room air.  Worked with physical therapy and maintained his oxygen saturation > 90% during ambulation.  He was motivated to quit smoking and requested for nicotine patches. Emphasized on importance of medication compliance and follow-up with primary care to prevent future hospitalizations which he acknowledged.  He lives in a motel alone and has neighbors who help him when needed.  He does not have insurance but says we will try to get his medications and follow-up to the best of his ability.  Discharge Diagnoses:  Principal Problem:   Dyspnea Active Problems:   Atrial fibrillation with rapid ventricular response (HCC)   Pleural effusion, right   Acute heart failure with preserved ejection fraction (HCC)   Atrial fibrillation with RVR [facility])   Discharge Instructions:  Discharge Instructions    (HEART FAILURE PATIENTS) Call MD:  Anytime you have any of the following symptoms: 1) 3 pound weight gain in 24 hours or 5 pounds in 1 week 2) shortness of breath, with or without a dry hacking cough 3) swelling in the hands, feet or stomach 4) if  you have to sleep on extra pillows at night in order to breathe.   Complete by: As directed    Diet - low sodium heart healthy   Complete by: As directed    Increase activity slowly   Complete by: As directed      Allergies as of 10/18/2019   No Known Allergies     Medication List    STOP taking these medications   apixaban 5 MG Tabs tablet Commonly known as: ELIQUIS   ibuprofen 200 MG  tablet Commonly known as: ADVIL   metoprolol succinate 50 MG 24 hr tablet Commonly known as: TOPROL-XL     TAKE these medications   acetaminophen 325 MG tablet Commonly known as: TYLENOL Take 650 mg by mouth every 6 (six) hours as needed for mild pain or headache.   diltiazem 180 MG 24 hr capsule Commonly known as: Cardizem CD Take 1 capsule (180 mg total) by mouth daily.   furosemide 40 MG tablet Commonly known as: LASIX Take 1 tablet (40 mg total) by mouth daily. If your weight goes up by 2-3 pounds in a day or 5 pounds in a week, take one extra tab (40 mg) of lasix What changed:   medication strength  how much to take  when to take this  additional instructions   metoprolol tartrate 25 MG tablet Commonly known as: LOPRESSOR Take 1 tablet (25 mg total) by mouth 2 (two) times daily.   nicotine 21 mg/24hr patch Commonly known as: NICODERM CQ - dosed in mg/24 hours Place 1 patch (21 mg total) onto the skin daily. Start taking on: October 19, 2019   warfarin 10 MG tablet Commonly known as: COUMADIN Take 1 tablet (10 mg total) by mouth daily at 6 PM.      Follow-up Information    [facility] On 10/28/2019.   Why: @10 :30am Contact information: [address] [place] [id number] [phone]         No Known Allergies  Consultations:  None   Procedures/Studies: DG Chest 1 View  Result Date: 10/17/2019 CLINICAL DATA:  84 year old male status post right thoracentesis. EXAM: CHEST  1 VIEW COMPARISON:  Chest x-ray 10/16/2019. FINDINGS: Previously noted right pleural effusion has decreased, currently small. This may be partially loculated laterally. No appreciable right pneumothorax. Bibasilar opacities which may reflect areas of atelectasis and/or consolidation. No left pleural effusion. No evidence of pulmonary edema. Heart size is mildly enlarged. Upper mediastinal contours are within normal limits.  IMPRESSION: 1. Decreased right-sided pleural effusion following thoracentesis. No pneumothorax or other acute complicating features. 2. Bibasilar opacities which may reflect areas of atelectasis and/or consolidation. Electronically Signed   By: Trudie Reedaniel  Entrikin M.D.   On: 10/17/2019 12:43   DG Chest 1 View  Result Date: 10/16/2019 CLINICAL DATA:  Chest tightness. Pleural effusion. EXAM: CHEST  1 VIEW COMPARISON:  Radiographs and CT 10/14/2019 FINDINGS: Moderate right pleural effusion, not significantly changed from prior exam. Pleural fluid occupies the lower 1/2 to 1/3 of right hemithorax. Cardiomegaly is unchanged. Small left pleural effusion on CT is not well seen radiographically. No new airspace disease. No pneumothorax. IMPRESSION: No significant change in radiographic appearance of the chest over the past 2 days. Stable moderate right pleural effusion. Stable cardiomegaly. Small left pleural effusion on CT is not well seen radiographically. Electronically Signed   By: Narda RutherfordMelanie  Sanford M.D.   On: 10/16/2019 19:48   CT CHEST WO CONTRAST  Result Date: 10/14/2019  CLINICAL DATA:  84 year old male with shortness of breath and tachycardia. Right pleural effusion versus consolidation. EXAM: CT CHEST WITHOUT CONTRAST TECHNIQUE: Multidetector CT imaging of the chest was performed following the standard protocol without IV contrast. COMPARISON:  Portable chest earlier today. Chest CTA 07/11/2019. FINDINGS: Cardiovascular: Vascular patency is not evaluated in the absence of IV contrast. Calcified coronary artery and aortic atherosclerosis. Cardiomegaly appears stable since November. No pericardial effusion. Mediastinum/Nodes: Negative. No mediastinal lymphadenopathy. Lungs/Pleura: Moderate layering right pleural effusion, similar size to that in November. Simple fluid density suggesting a transudate. Superimposed compressive right lung atelectasis, a especially in the lower lobe. Upper lobe centrilobular  emphysema more apparent on the right. The trachea through mainstem bronchi are patent although there is sub solid material in the right bronchus intermedius on series 8, image 85 which is new since November. In the left lung there is a small layering effusion which has decreased since November. Mild left lung atelectasis. No inflammatory appearing pulmonary opacity. Upper Abdomen: Stable visible noncontrast liver, gallbladder, spleen, pancreas and adrenal glands. Negative visible bowel in the upper abdomen. Musculoskeletal: Advanced degenerative changes in the thoracic and visible cervical spine. Stable small round sclerotic focus in the posterior right 8th rib, probably a benign bone island. No acute osseous abnormality identified. IMPRESSION: 1. Moderate layering right pleural effusion with compressive atelectasis, similar to that in November. A small left pleural effusion has decreased since November. 2. Evidence of retained secretions in the bronchus intermedius, but no pulmonary opacity suspicious for pneumonia. Emphysema (ICD10-J43.9). 3. Cardiomegaly. Coronary artery and Aortic atherosclerosis (ICD10-I70.0). Electronically Signed   By: Odessa Fleming M.D.   On: 10/14/2019 20:22   DG Chest Port 1 View  Result Date: 10/14/2019 CLINICAL DATA:  84 year old male with history of shortness of breath and tachycardia. EXAM: PORTABLE CHEST 1 VIEW COMPARISON:  Chest x-ray 07/14/2019. FINDINGS: Dense opacity in the right mid to lower hemithorax compatible with an enlarging right pleural effusion with underlying atelectasis and/or consolidation throughout the right middle and lower lobes. Left lung is clear. No left pleural effusion. No pneumothorax. No evidence of pulmonary edema. Moderate cardiomegaly, similar to prior studies. Upper mediastinal contours are within normal limits. IMPRESSION: 1. Enlarging moderate to large right pleural effusion with extensive atelectasis and/or consolidation in the right middle and lower  lobes. 2. Moderate cardiomegaly. Electronically Signed   By: Trudie Reed M.D.   On: 10/14/2019 14:03   US THORACENTESIS ASP PLEURAL SPACE W/IMG GUIDE  Result Date: 10/17/2019 INDICATION: Patient with history of tobacco abuse, heart failure, dyspnea, recurrent right pleural effusion. Request made for diagnostic and therapeutic right thoracentesis. EXAM: ULTRASOUND GUIDED DIAGNOSTIC AND THERAPEUTIC RIGHT THORACENTESIS MEDICATIONS: None COMPLICATIONS: None immediate. PROCEDURE: An ultrasound guided thoracentesis was thoroughly discussed with the patient and questions answered. The benefits, risks, alternatives and complications were also discussed. The patient understands and wishes to proceed with the procedure. Written consent was obtained. Ultrasound was performed to localize and mark an adequate pocket of fluid in the right chest. The area was then prepped and draped in the normal sterile fashion. 1% Lidocaine was used for local anesthesia. Under ultrasound guidance a 6 Fr Safe-T-Centesis catheter was introduced. Thoracentesis was performed. The catheter was removed and a dressing applied. FINDINGS: A total of approximately 1.4 liters of yellow fluid was removed. Samples were sent to the laboratory as requested by the clinical team. IMPRESSION: Successful ultrasound guided diagnostic and therapeutic right thoracentesis yielding 1.4 liters of pleural fluid. Read by: Jeananne Rama, PA-C Electronically  Signed   By: Judie Petit.  Shick M.D.   On: 10/17/2019 12:33      Subjective: Breathing much better.  No new complaints.  Wants to go home.  Discharge Exam: Vitals:   10/18/19 0822 10/18/19 1000  BP: 109/75   Pulse: 96   Resp:    Temp:    SpO2: 95% 97%   Vitals:   10/18/19 0020 10/18/19 0450 10/18/19 0822 10/18/19 1000  BP: (!) 103/58 (!) 115/91 109/75   Pulse: 82 80 96   Resp: 18 18    Temp: 98.4 F (36.9 C) 97.9 F (36.6 C)    TempSrc: Oral Oral    SpO2: 99% 96% 95% 97%  Weight: 88.3 kg      Height:        General: Pt is alert, awake, not in acute distress Cardiovascular: RRR, S1/S2 +, no murmurs Respiratory: mildly reduced breath sounds right side, no wheezing, no rhonchi Abdominal: Soft, NT, ND, bowel sounds + Extremities: no edema, no cyanosis   The results of significant diagnostics from this hospitalization (including imaging, microbiology, ancillary and laboratory) are listed below for reference.     Microbiology: Recent Results (from the past 240 hour(s))  SARS CORONAVIRUS 2 (TAT 6-24 HRS) Nasopharyngeal Nasopharyngeal Swab     Status: None   Collection Time: 10/14/19  5:18 PM   Specimen: Nasopharyngeal Swab  Result Value Ref Range Status   SARS Coronavirus 2 NEGATIVE NEGATIVE Final    Comment: (NOTE) SARS-CoV-2 target nucleic acids are NOT DETECTED. The SARS-CoV-2 RNA is generally detectable in upper and lower respiratory specimens during the acute phase of infection. Negative results do not preclude SARS-CoV-2 infection, do not rule out co-infections with other pathogens, and should not be used as the sole basis for treatment or other patient management decisions. Negative results must be combined with clinical observations, patient history, and epidemiological information. The expected result is Negative. Fact Sheet for Patients: HairSlick[employer]no Fact Sheet for Healthcare Providers: quierodirigir.com This test is not yet approved or cleared by the [place] FDA and  has been authorized for detection and/or diagnosis of SARS-CoV-2 by FDA under an Emergency Use Authorization (EUA). This EUA will remain  in effect (meaning this test can be used) for the duration of the COVID-19 declaration under Section 56 4(b)(1) of the Act, 21 U.S.C. section 360bbb-3(b)(1), unless the authorization is terminated or revoked sooner. Performed at [facility], [address], [place], [place] [address]   Gram  stain     Status: None   Collection Time: 10/17/19 12:16 PM   Specimen: Lung, Right; Pleural Fluid  Result Value Ref Range Status   Specimen Description PLEURAL  Final   Special Requests LUNG  Final   Gram Stain   Final    RARE WBC PRESENT,BOTH PMN AND MONONUCLEAR NO ORGANISMS SEEN Performed at [facility], [address], [place], [place] [address]    Report Status 10/18/2019 FINAL  Final  Culture, body fluid-bottle     Status: None (Preliminary result)   Collection Time: 10/17/19 12:16 PM   Specimen: Pleura  Result Value Ref Range Status   Specimen Description PLEURAL  Final   Special Requests LUNG RIGHT  Final   Culture   Final    NO GROWTH 1 DAY Performed at [facility], [address], [place], [place] [address]    Report Status PENDING  Incomplete     Labs: BNP (last 3 results) Recent Labs  07/11/19 0855 10/14/19 1330  BNP 300.3* 320.5*   Basic Metabolic Panel: Recent Labs  Lab 10/14/19 1330 10/15/19 0429 10/16/19 0748 10/17/19 0458  NA 139 138 138 137  K 4.0 4.0 3.9 3.8  CL 107 107 104 102  CO2 21* 24 25 23   GLUCOSE 131* 105* 102* 98  BUN 12 15 20 23   CREATININE 1.13 1.24 1.29* 1.16  CALCIUM 8.8* 8.7* 8.6* 8.4*  MG  --   --   --  1.9   Liver Function Tests: Recent Labs  Lab 10/14/19 1330 10/17/19 1702  AST 26  --   ALT 29  --   ALKPHOS 84  --   BILITOT 0.8  --   PROT 5.9* 5.8*  ALBUMIN 3.4* 3.3*   No results for input(s): LIPASE, AMYLASE in the last 168 hours. No results for input(s): AMMONIA in the last 168 hours. CBC: Recent Labs  Lab 10/14/19 1330 10/15/19 0429 10/16/19 0748 10/18/19 0718  WBC 8.4 8.3 8.9 8.8  NEUTROABS 6.7  --   --   --   HGB 12.6* 11.8* 12.5* 12.7*  HCT 39.1 35.8* 37.5* 37.2*  MCV 93.8 92.7 91.5 89.4  PLT 245 231 248 231   Cardiac Enzymes: No results for input(s): CKTOTAL, CKMB, CKMBINDEX, TROPONINI in the last 168 hours. BNP: Invalid input(s): POCBNP CBG: Recent Labs  Lab 10/14/19 1744   GLUCAP 109*   D-Dimer No results for input(s): DDIMER in the last 72 hours. Hgb A1c No results for input(s): HGBA1C in the last 72 hours. Lipid Profile No results for input(s): CHOL, HDL, LDLCALC, TRIG, CHOLHDL, LDLDIRECT in the last 72 hours. Thyroid function studies No results for input(s): TSH, T4TOTAL, T3FREE, THYROIDAB in the last 72 hours.  Invalid input(s): FREET3 Anemia work up No results for input(s): VITAMINB12, FOLATE, FERRITIN, TIBC, IRON, RETICCTPCT in the last 72 hours. Urinalysis No results found for: COLORURINE, APPEARANCEUR, LABSPEC, PHURINE, GLUCOSEU, HGBUR, BILIRUBINUR, KETONESUR, PROTEINUR, UROBILINOGEN, NITRITE, LEUKOCYTESUR Sepsis Labs Invalid input(s): PROCALCITONIN,  WBC,  LACTICIDVEN Microbiology Recent Results (from the past 240 hour(s))  SARS CORONAVIRUS 2 (TAT 6-24 HRS) Nasopharyngeal Nasopharyngeal Swab     Status: None   Collection Time: 10/14/19  5:18 PM   Specimen: Nasopharyngeal Swab  Result Value Ref Range Status   SARS Coronavirus 2 NEGATIVE NEGATIVE Final    Comment: (NOTE) SARS-CoV-2 target nucleic acids are NOT DETECTED. The SARS-CoV-2 RNA is generally detectable in upper and lower respiratory specimens during the acute phase of infection. Negative results do not preclude SARS-CoV-2 infection, do not rule out co-infections with other pathogens, and should not be used as the sole basis for treatment or other patient management decisions. Negative results must be combined with clinical observations, patient history, and epidemiological information. The expected result is Negative. Fact Sheet for Patients: 10/16/19 Fact Sheet for Healthcare Providers: 10/16/19 This test is not yet approved or cleared by the [employer] FDA and  has been authorized for detection and/or diagnosis of SARS-CoV-2 by FDA under an Emergency Use Authorization (EUA). This EUA will remain  in  effect (meaning this test can be used) for the duration of the COVID-19 declaration under Section 56 4(b)(1) of the Act, 21 U.S.C. section 360bbb-3(b)(1), unless the authorization is terminated or revoked sooner. Performed at [facility], [address], [place], [address] [place]   Gram stain     Status: None   Collection Time: 10/17/19 12:16 PM   Specimen: Lung, Right; Pleural Fluid  Result Value Ref  Range Status   Specimen Description PLEURAL  Final   Special Requests LUNG  Final   Gram Stain   Final    RARE WBC PRESENT,BOTH PMN AND MONONUCLEAR NO ORGANISMS SEEN Performed at [facility], [address], [place], [place] [address]    Report Status 10/18/2019 FINAL  Final  Culture, body fluid-bottle     Status: None (Preliminary result)   Collection Time: 10/17/19 12:16 PM   Specimen: Pleura  Result Value Ref Range Status   Specimen Description PLEURAL  Final   Special Requests LUNG RIGHT  Final   Culture   Final    NO GROWTH 1 DAY Performed at [facility], [address] [place], [place] [address]    Report Status PENDING  Incomplete     Time coordinating discharge: Over 30 minutes  SIGNED:   Lucky Cowboy, MD  [facility] 10/18/2019, 5:12 PM  If 7PM-7AM, please contact night-coverage

## 2019-10-18 NOTE — Progress Notes (Signed)
Discharge and medication education given with teach back. Peripheral iv was removed and dressing applied.  Taxi voucher for patient given.  Patient's belongings with patient. Patient was transported to valet entrance in wheelchair by nurse tech.

## 2019-10-19 LAB — PATHOLOGIST SMEAR REVIEW: Path Review: INCREASED

## 2019-10-22 LAB — CULTURE, BODY FLUID W GRAM STAIN -BOTTLE: Culture: NO GROWTH

## 2019-10-28 ENCOUNTER — Inpatient Hospital Stay: Payer: Self-pay

## 2019-11-11 ENCOUNTER — Other Ambulatory Visit: Payer: Self-pay

## 2019-11-11 ENCOUNTER — Ambulatory Visit: Payer: Medicare Other | Admitting: Family Medicine

## 2019-11-11 ENCOUNTER — Ambulatory Visit: Payer: Medicare Other | Attending: Family Medicine | Admitting: Family Medicine

## 2019-11-11 ENCOUNTER — Telehealth: Payer: Self-pay

## 2019-11-11 ENCOUNTER — Encounter: Payer: Self-pay | Admitting: Family Medicine

## 2019-11-11 VITALS — BP 114/63 | HR 89 | Ht 74.0 in | Wt 205.2 lb

## 2019-11-11 DIAGNOSIS — I5031 Acute diastolic (congestive) heart failure: Secondary | ICD-10-CM | POA: Diagnosis not present

## 2019-11-11 DIAGNOSIS — I4891 Unspecified atrial fibrillation: Secondary | ICD-10-CM | POA: Diagnosis not present

## 2019-11-11 DIAGNOSIS — R5383 Other fatigue: Secondary | ICD-10-CM

## 2019-11-11 LAB — POCT INR: INR: 2.4 (ref 2.0–3.0)

## 2019-11-11 MED ORDER — DILTIAZEM HCL ER COATED BEADS 180 MG PO CP24: 180.0000 mg | ORAL_CAPSULE | Freq: Every day | ORAL | 2 refills | Status: DC

## 2019-11-11 MED ORDER — APIXABAN 5 MG PO TABS: 5.0000 mg | ORAL_TABLET | Freq: Two times a day (BID) | ORAL | 3 refills | Status: DC

## 2019-11-11 MED ORDER — METOPROLOL TARTRATE 25 MG PO TABS: 25.0000 mg | ORAL_TABLET | Freq: Two times a day (BID) | ORAL | 2 refills | Status: DC

## 2019-11-11 MED ORDER — FUROSEMIDE 40 MG PO TABS: 40.0000 mg | ORAL_TABLET | Freq: Every day | ORAL | 2 refills | Status: DC

## 2019-11-11 NOTE — Progress Notes (Signed)
States that he has not seen a doctor in 30 years.

## 2019-11-11 NOTE — Progress Notes (Signed)
Subjective:  Patient ID: Alfred Mercado, male    DOB: [birth year]  Age: 84 y.o. MRN: [id number]  CC: No chief complaint on file.   HPI Alfred Mercado is an 84 year old male with a history of atrial fibrillation with RVR, heart failure with preserved ejection fraction (EF 50 to 55%) who presents today to establish care.  He has not been under the care of a physician for the last 30 years. He was hospitalized from 10/14/2019 through 10/18/19 for acute CHF exacerbation. He had a previous hospitalization 4 months ago for same and was status post thoracentesis for R pleural effusion and had run out of his medications which led to her most recent hospitalization.  His breathing is 'so so' he states but denies presence of pedal edema, chest pain and endorses compliance with his medications.  He needs something for energy he states. He endorses easy bruising which occurs when he bumps his hand against objects. He has no additional concerns today.  Past Medical History:  Diagnosis Date  . Acute heart failure with preserved ejection fraction (HCC)   . Atrial fibrillation with rapid ventricular response (HCC) 07/11/2019     No family history on file.  No Known Allergies  Outpatient Medications Prior to Visit  Medication Sig Dispense Refill  . acetaminophen (TYLENOL) 325 MG tablet Take 650 mg by mouth every 6 (six) hours as needed for mild pain or headache.    . diltiazem (CARDIZEM CD) 180 MG 24 hr capsule Take 1 capsule (180 mg total) by mouth daily. 30 capsule 2  . furosemide (LASIX) 40 MG tablet Take 1 tablet (40 mg total) by mouth daily. If your weight goes up by 2-3 pounds in a day or 5 pounds in a week, take one extra tab (40 mg) of lasix 45 tablet 2  . metoprolol tartrate (LOPRESSOR) 25 MG tablet Take 1 tablet (25 mg total) by mouth 2 (two) times daily. 60 tablet 2  . warfarin (COUMADIN) 10 MG tablet Take 1 tablet (10 mg total) by mouth daily at 6 PM. 30 tablet 0  . nicotine (NICODERM CQ  - DOSED IN MG/24 HOURS) 21 mg/24hr patch Place 1 patch (21 mg total) onto the skin daily. (Patient not taking: Reported on 11/11/2019) 28 patch 0   No facility-administered medications prior to visit.     ROS Review of Systems  Constitutional: Negative for activity change and appetite change.  HENT: Negative for sinus pressure and sore throat.   Eyes: Negative for visual disturbance.  Respiratory: Negative for cough, chest tightness and shortness of breath.   Cardiovascular: Negative for chest pain and leg swelling.  Gastrointestinal: Negative for abdominal distention, abdominal pain, constipation and diarrhea.  Endocrine: Negative.   Genitourinary: Negative for dysuria.  Musculoskeletal: Negative for joint swelling and myalgias.  Skin: Negative for rash.  Allergic/Immunologic: Negative.   Neurological: Negative for weakness, light-headedness and numbness.  Psychiatric/Behavioral: Negative for dysphoric mood and suicidal ideas.    Objective:  BP 114/63   Pulse 89   Ht 6\' 2"  (1.88 m)   Wt 205 lb 3.2 oz (93.1 kg)   SpO2 94%   BMI 26.35 kg/m   BP/Weight 11/11/2019 10/18/2019 07/15/2019  Systolic BP 114 109 108  Diastolic BP 63 75 71  Wt. (Lbs) 205.2 194.6 201.9  BMI 26.35 24.99 25.92      Physical Exam Constitutional:      Appearance: He is well-developed.  Neck:     Vascular: No JVD.  Cardiovascular:  Rate and Rhythm: Normal rate.     Heart sounds: Normal heart sounds. No murmur.  Pulmonary:     Effort: Pulmonary effort is normal.     Breath sounds: Normal breath sounds. No wheezing or rales.  Chest:     Chest wall: No tenderness.  Abdominal:     General: Bowel sounds are normal. There is no distension.     Palpations: Abdomen is soft. There is no mass.     Tenderness: There is no abdominal tenderness.  Musculoskeletal:        General: Normal range of motion.     Right lower leg: No edema.     Left lower leg: No edema.  Skin:    Comments: Bruise on dorsum  of right hand  Neurological:     Mental Status: He is alert and oriented to person, place, and time.  Psychiatric:        Mood and Affect: Mood normal.     CMP Latest Ref Rng & Units 10/17/2019 10/16/2019 10/15/2019  Glucose 70 - 99 mg/dL 98 102(H) 105(H)  BUN 8 - 23 mg/dL 23 20 15   Creatinine 0.61 - 1.24 mg/dL 1.16 1.29(H) 1.24  Sodium 135 - 145 mmol/L 137 138 138  Potassium 3.5 - 5.1 mmol/L 3.8 3.9 4.0  Chloride 98 - 111 mmol/L 102 104 107  CO2 22 - 32 mmol/L 23 25 24   Calcium 8.9 - 10.3 mg/dL 8.4(L) 8.6(L) 8.7(L)  Total Protein 6.5 - 8.1 g/dL 5.8(L) - -  Total Bilirubin 0.3 - 1.2 mg/dL - - -  Alkaline Phos 38 - 126 U/L - - -  AST 15 - 41 U/L - - -  ALT 0 - 44 U/L - - -    Lipid Panel     Component Value Date/Time   CHOL 147 07/13/2019 0457   TRIG 77 07/13/2019 0457   HDL 38 (L) 07/13/2019 0457   CHOLHDL 3.9 07/13/2019 0457   VLDL 15 07/13/2019 0457   LDLCALC 94 07/13/2019 0457    CBC    Component Value Date/Time   WBC 8.8 10/18/2019 0718   RBC 4.16 (L) 10/18/2019 0718   HGB 12.7 (L) 10/18/2019 0718   HCT 37.2 (L) 10/18/2019 0718   PLT 231 10/18/2019 0718   MCV 89.4 10/18/2019 0718   MCH 30.5 10/18/2019 0718   MCHC 34.1 10/18/2019 0718   RDW 13.8 10/18/2019 0718   LYMPHSABS 1.1 10/14/2019 1330   MONOABS 0.5 10/14/2019 1330   EOSABS 0.1 10/14/2019 1330   BASOSABS 0.0 10/14/2019 1330    Lab Results  Component Value Date   INR 2.4 11/11/2019   INR 1.3 (H) 10/18/2019   INR 1.2 10/17/2019     Assessment & Plan:  1. Atrial fibrillation with rapid ventricular response (HCC) INR of 2.4 today; goal of 2-3. On rate control with Cardizem and metoprolol I have switched him from Coumadin to Eliquis however he will need to return with all his medications to see the clinical pharmacist for INR check next week at which time we will retrieve his Coumadin bottle and he will then pick up Eliquis from the pharmacy in-house to prevent him from taking both Coumadin and  Eliquis at the same time. - Ambulatory referral to Cardiology - POCT INR - apixaban (ELIQUIS) 5 MG TABS tablet; Take 1 tablet (5 mg total) by mouth 2 (two) times daily.  Dispense: 60 tablet; Refill: 3 - diltiazem (CARDIZEM CD) 180 MG 24 hr capsule; Take 1 capsule (180  mg total) by mouth daily.  Dispense: 30 capsule; Refill: 2 - metoprolol tartrate (LOPRESSOR) 25 MG tablet; Take 1 tablet (25 mg total) by mouth 2 (two) times daily.  Dispense: 60 tablet; Refill: 2  2. Acute heart failure with preserved ejection fraction (HCC) EF 50 to 55% Euvolemic Referred to cardiology for optimization of management - furosemide (LASIX) 40 MG tablet; Take 1 tablet (40 mg total) by mouth daily. If your weight goes up by 2-3 pounds in a day or 5 pounds in a week, take one extra tab (40 mg) of lasix  Dispense: 45 tablet; Refill: 2  3. Other fatigue - VITAMIN D 25 Hydroxy (Vit-D Deficiency, Fractures)      Hoy Register, MD, FAAFP. [facility] [place], [place] [phone]   11/11/2019, 4:19 PM

## 2019-11-11 NOTE — Telephone Encounter (Signed)
Met with the patient when he was in the clinic today.  He has been living in a motel for the past 12 year. Social security income $800/month.  Rent $600.  He said that he lives on approximately $100/month.  He was not aware that he has insurance.  Provided him with his medicare A/B number. He has no  Drug coverage.  Encouraged him to apply for medicaid and provided him with a medicaid application as well as the phone number for DSS to call regarding food stamps.  He said that his neighbor will be able to help him complete the medicaid app and assist with phone calls. .  Also provided him with the phone number for [facility] to check on options for drug coverage- medicare part D, low income subsidy and medicare savings program.   He was very appreciative of all of the information.  Gave him a SCAT application which he said his friend can help him complete. He was instructed to return it to this CM when completed. He did not want a bus pass as he said that the bus makes him nervous.  He uses a cab for transportation.

## 2019-11-12 ENCOUNTER — Telehealth: Payer: Self-pay

## 2019-11-12 ENCOUNTER — Other Ambulatory Visit: Payer: Self-pay | Admitting: Family Medicine

## 2019-11-12 LAB — VITAMIN D 25 HYDROXY (VIT D DEFICIENCY, FRACTURES): Vit D, 25-Hydroxy: 9.6 ng/mL — ABNORMAL LOW (ref 30.0–100.0)

## 2019-11-12 MED ORDER — ERGOCALCIFEROL 1.25 MG (50000 UT) PO CAPS: 50000.0000 [IU] | ORAL_CAPSULE | ORAL | 0 refills | Status: DC

## 2019-11-12 NOTE — Telephone Encounter (Signed)
Phone numbers are not correct on profile. Patient will be given results when he comes in on 11/17/2019

## 2019-11-12 NOTE — Telephone Encounter (Signed)
-----   Message from Hoy Register, MD sent at 11/12/2019 12:46 PM EDT ----- Vitamin D is low which can explain his fatigue. I have sent a rx for Drisdol to his Pharmacy.

## 2019-11-13 ENCOUNTER — Ambulatory Visit: Payer: Medicare Other | Admitting: Pharmacist

## 2019-11-17 ENCOUNTER — Other Ambulatory Visit: Payer: Self-pay

## 2019-11-17 ENCOUNTER — Telehealth: Payer: Self-pay

## 2019-11-17 ENCOUNTER — Ambulatory Visit: Payer: Medicare Other | Attending: Family Medicine | Admitting: Pharmacist

## 2019-11-17 DIAGNOSIS — I4891 Unspecified atrial fibrillation: Secondary | ICD-10-CM | POA: Diagnosis not present

## 2019-11-17 DIAGNOSIS — Z7901 Long term (current) use of anticoagulants: Secondary | ICD-10-CM | POA: Insufficient documentation

## 2019-11-17 DIAGNOSIS — Z5181 Encounter for therapeutic drug level monitoring: Secondary | ICD-10-CM | POA: Insufficient documentation

## 2019-11-17 LAB — POCT INR: INR: 3 (ref 2.0–3.0)

## 2019-11-17 NOTE — Telephone Encounter (Signed)
Met with the patient when he was in the clinic today.  He dropped off medicaid and SCAT applications.  Reminded him of the need to call [facility] about medication coverage to supplement his medicare A/B.   Patient called this afternoon stating he was picking up medications at [facility] and said that the first time he picked up meds, they were $14, now the cost is > $40,  He was not sure he could afford it..  This CM explained that he could transfer the meds to [facility] and he could pick them up tomorrow afternoon as her Benard Halsted, [facility]. He would need to complete the [employer] and [facility] applications.  The patient said that he would like to leave the meds at [facility] for now and will consider changing pharmacies next month.  Again reminded him to call [facility] about medication coverage.

## 2019-11-17 NOTE — Progress Notes (Signed)
    Pharmacy Anticoagulation Clinic  Subjective: Patient presents today for INR monitoring. Anticoagulation indication is a fib.   Current dose of warfarin: 5 mg daily. Pt is being transitioned from warfarin to Eliquis. He is seeing [place] today at the recommendation of his PCP.   Adherence to warfarin: reports that he has not had his warfarin in 2 days.  Signs/symptoms of bleeding: none  Recent changes in diet: none  Recent changes in medications: none Upcoming procedures that may impact anticoagulation: none    Objective: Today's INR = 3.0  Lab Results  Component Value Date   INR 3.0 11/17/2019   INR 2.4 11/11/2019   INR 1.3 (H) 10/18/2019     Assessment and Plan: Anticoagulation: Patient is Therapeutic based on patient's INR of 3.0 and patient's INR goal of 2-3.  Of note, when changing from warfarin to Eliquis, it is ideal to wait until the INR is 2.0 or below to minimize bleeding risk. His INR is currently 3.0 but he has missed his doses for the past 2 days. I have encouraged him to skip today and start Eliquis tomorrow. I have taken his warfarin bottle to minimize confusion. I have accompanied him to the pharmacy and he has picked up his Eliquis. Additionally, he knows to start the medication tomorrow.   Of note, recent lab results (vit D) reviewed with him. He will pick up his Vitamin D rx.  Patient verbalized understanding and was provided with written instructions.   Butch Penny, PharmD, CPP Clinical Pharmacist [facility] [phone]

## 2019-11-18 ENCOUNTER — Telehealth: Payer: Self-pay

## 2019-11-18 NOTE — Telephone Encounter (Signed)
Completed medicaid application mailed to [facility] DSS. Completed SCAT application faxed to SCAT eligibility

## 2019-11-23 ENCOUNTER — Telehealth: Payer: Self-pay

## 2019-11-23 NOTE — Telephone Encounter (Signed)
Call placed to [facility]/SCAT who said that she has not received the referral for services. Referral refaxed to SCAT eligibility

## 2019-12-14 ENCOUNTER — Telehealth: Payer: Self-pay

## 2019-12-14 NOTE — Telephone Encounter (Signed)
Call  from the patient. He stated that he received a letter from [facility] about his medicaid but he did not understand the letter. He could not provide any details of what the letter said.   He then said that he has been feeling more short of breath lately and needs to lay down and rest.  He did not want to go to the ED or feel that it was necessary to go to the ED  and requested to follow up at [facility]   Appointment scheduled for tomorrow - 12/15/2019. He said that he would find a ride.  Instructed him to bring the letter from [facility] with him.    Also reminded him of his appt with cardiology -  12/17/2019 @ 1320 and provided him with the office address.

## 2019-12-14 NOTE — Progress Notes (Signed)
Subjective:    Patient ID: Alfred Mercado, male    DOB: [birth year], 84 y.o.   MRN: [id number]  New to practice 10/2019.  Dr Max Fickle is PCP and saw pt in March 2021  Hx of Chronic Afib, CHF, fatigue  This patient is seen today as a work in for increased dyspnea exertion and needed some explanation on his Medicaid and Medicare managed care paperwork applications below is previous documentation from last visit in March 2021 with his new primary care provider   Alfred Mercado is an 84 year old male with a history of atrial fibrillation with RVR, heart failure with preserved ejection fraction (EF 50 to 55%) who presents today to establish care.  He has not been under the care of a physician for the last 30 years. He was hospitalized from 10/14/2019 through 10/18/19 for acute CHF exacerbation. He had a previous hospitalization 4 months ago for same and was status post thoracentesis for R pleural effusion and had run out of his medications which led to her most recent hospitalization.  The patient has been transitioned off warfarin over to Eliquis  The patient maintains Cardizem and metoprolol for rate control and is on furosemide once daily an additional dose if needed for weight gain  The patient states he has been taking the furosemide daily but states his dyspnea has gotten worse.  He has had a right pleural effusion has had been drained twice.  The patient has an upcoming appointment with cardiology on April 22.  He states he does have a productive cough with minimal white mucus.  He has no wheezing.  No known prior history of COPD.  Note the patient does live in a motel room at this time.   Past Medical History:  Diagnosis Date  . Acute heart failure with preserved ejection fraction (HCC)   . Atrial fibrillation with rapid ventricular response (HCC) 07/11/2019     History reviewed. No pertinent family history.   Social History   Socioeconomic History  . Marital status: Divorced   Spouse name: Not on file  . Number of children: Not on file  . Years of education: Not on file  . Highest education level: Not on file  Occupational History  . Not on file  Tobacco Use  . Smoking status: Light Tobacco Smoker  . Smokeless tobacco: Never Used  Substance and Sexual Activity  . Alcohol use: Not Currently  . Drug use: Never  . Sexual activity: Not Currently  Other Topics Concern  . Not on file  Social History Narrative  . Not on file   Social Determinants of Health   Financial Resource Strain:   . Difficulty of Paying Living Expenses:   Food Insecurity:   . Worried About Programme researcher, broadcasting/film/video in the Last Year:   . Barista in the Last Year:   Transportation Needs:   . Freight forwarder (Medical):   Marland Kitchen Lack of Transportation (Non-Medical):   Physical Activity:   . Days of Exercise per Week:   . Minutes of Exercise per Session:   Stress:   . Feeling of Stress :   Social Connections:   . Frequency of Communication with Friends and Family:   . Frequency of Social Gatherings with Friends and Family:   . Attends Religious Services:   . Active Member of Clubs or Organizations:   . Attends Banker Meetings:   Marland Kitchen Marital Status:   Intimate Partner Violence:   . Fear of  Current or Ex-Partner:   . Emotionally Abused:   Marland Kitchen Physically Abused:   . Sexually Abused:      No Known Allergies   Outpatient Medications Prior to Visit  Medication Sig Dispense Refill  . acetaminophen (TYLENOL) 325 MG tablet Take 650 mg by mouth every 6 (six) hours as needed for mild pain or headache.    . diltiazem (CARDIZEM CD) 180 MG 24 hr capsule Take 1 capsule (180 mg total) by mouth daily. 30 capsule 2  . ergocalciferol (DRISDOL) 1.25 MG (50000 UT) capsule Take 1 capsule (50,000 Units total) by mouth once a week. 12 capsule 0  . metoprolol tartrate (LOPRESSOR) 25 MG tablet Take 1 tablet (25 mg total) by mouth 2 (two) times daily. 60 tablet 2  . apixaban (ELIQUIS) 5  MG TABS tablet Take 1 tablet (5 mg total) by mouth 2 (two) times daily. 60 tablet 3  . furosemide (LASIX) 40 MG tablet Take 1 tablet (40 mg total) by mouth daily. If your weight goes up by 2-3 pounds in a day or 5 pounds in a week, take one extra tab (40 mg) of lasix 45 tablet 2  . nicotine (NICODERM CQ - DOSED IN MG/24 HOURS) 21 mg/24hr patch Place 1 patch (21 mg total) onto the skin daily. (Patient not taking: Reported on 11/11/2019) 28 patch 0   No facility-administered medications prior to visit.      Review of Systems  Constitutional: Positive for unexpected weight change.  HENT: Negative.   Respiratory: Positive for cough and shortness of breath. Negative for chest tightness, wheezing and stridor.   Cardiovascular: Positive for leg swelling. Negative for chest pain and palpitations.  Gastrointestinal: Negative.   Genitourinary: Negative.   Musculoskeletal: Negative.   Skin: Negative.   Neurological: Negative.   Psychiatric/Behavioral: Negative.        Objective:   Physical Exam Vitals:   12/15/19 1335  BP: 132/68  Pulse: 83  Temp: 98.2 F (36.8 C)  TempSrc: Temporal  SpO2: 96%  Weight: 203 lb 3.2 oz (92.2 kg)  Height: 6\' 2"  (1.88 m)    Gen: Pleasant, well-nourished, in no distress,  normal affect  ENT: No lesions,  mouth clear,  oropharynx clear, no postnasal drip  Neck: No JVD, no TMG, no carotid bruits  Lungs: No use of accessory muscles, dullness to percussion one half the way up in the right chest and decreased breath sounds in the right chest as well compatible with pleural effusion, rales heard in other area lung zones  Cardiovascular: RRR, heart sounds normal, no murmur or gallops, 2+ peripheral edema  Abdomen: soft and NT, no HSM,  BS normal  Musculoskeletal: No deformities, no cyanosis or clubbing  Neuro: alert, non focal  Skin: Warm, no lesions or rashes  No results found.        Assessment & Plan:  I personally reviewed all images and lab  data in the [facility] system as well as any outside material available during this office visit and agree with the  radiology impressions.   Pleural effusion, right Pleural effusion on the right secondary to acute heart failure with preserved ejection fraction  Plan is to increase furosemide to 40 mg twice a day and obtain a chest x-ray  This patient may yet require repeat thoracentesis  Chronic heart failure with preserved ejection fraction (HCC) Chronic heart failure with preserved ejection fraction and increased volume status  Plan is to increase furosemide to 40 mg twice daily  Atrial fibrillation  with RVR ([place]) History of atrial fibrillation with rapid ventricular response at this time rate is well controlled  Follow-up per cardiology  Continue Eliquis Cardizem and metoprolol   Diagnoses and all orders for this visit:  Pleural effusion, right -     DG Chest 2 View; Future  Acute heart failure with preserved ejection fraction (HCC) -     furosemide (LASIX) 40 MG tablet; Take 1 tablet (40 mg total) by mouth 2 (two) times daily. If your weight goes up by 2-3 pounds in a day or 5 pounds in a week, take one extra tab (40 mg) of lasix -     DG Chest 2 View; Future  Chronic heart failure with preserved ejection fraction (HCC)  Atrial fibrillation with RVR ([place])

## 2019-12-15 ENCOUNTER — Other Ambulatory Visit: Payer: Self-pay

## 2019-12-15 ENCOUNTER — Ambulatory Visit: Payer: Medicare Other | Attending: Critical Care Medicine | Admitting: Critical Care Medicine

## 2019-12-15 ENCOUNTER — Other Ambulatory Visit: Payer: Self-pay | Admitting: Pharmacist

## 2019-12-15 ENCOUNTER — Encounter: Payer: Self-pay | Admitting: Critical Care Medicine

## 2019-12-15 VITALS — BP 132/68 | HR 83 | Temp 98.2°F | Ht 74.0 in | Wt 203.2 lb

## 2019-12-15 DIAGNOSIS — F17210 Nicotine dependence, cigarettes, uncomplicated: Secondary | ICD-10-CM | POA: Diagnosis not present

## 2019-12-15 DIAGNOSIS — I5033 Acute on chronic diastolic (congestive) heart failure: Secondary | ICD-10-CM | POA: Insufficient documentation

## 2019-12-15 DIAGNOSIS — Z79899 Other long term (current) drug therapy: Secondary | ICD-10-CM | POA: Insufficient documentation

## 2019-12-15 DIAGNOSIS — R05 Cough: Secondary | ICD-10-CM | POA: Insufficient documentation

## 2019-12-15 DIAGNOSIS — I4891 Unspecified atrial fibrillation: Secondary | ICD-10-CM

## 2019-12-15 DIAGNOSIS — I5032 Chronic diastolic (congestive) heart failure: Secondary | ICD-10-CM | POA: Diagnosis not present

## 2019-12-15 DIAGNOSIS — Z7901 Long term (current) use of anticoagulants: Secondary | ICD-10-CM | POA: Diagnosis not present

## 2019-12-15 DIAGNOSIS — I5031 Acute diastolic (congestive) heart failure: Secondary | ICD-10-CM

## 2019-12-15 DIAGNOSIS — J90 Pleural effusion, not elsewhere classified: Secondary | ICD-10-CM

## 2019-12-15 MED ORDER — FUROSEMIDE 40 MG PO TABS: 40.0000 mg | ORAL_TABLET | Freq: Two times a day (BID) | ORAL | 2 refills | Status: DC

## 2019-12-15 MED ORDER — APIXABAN 5 MG PO TABS: 5.0000 mg | ORAL_TABLET | Freq: Two times a day (BID) | ORAL | 3 refills | Status: DC

## 2019-12-15 NOTE — Assessment & Plan Note (Signed)
History of atrial fibrillation with rapid ventricular response at this time rate is well controlled  Follow-up per cardiology  Continue Eliquis Cardizem and metoprolol

## 2019-12-15 NOTE — Telephone Encounter (Signed)
Met with patient when he was in the clinic today.  He had a letter from [place] him that his medical home is [facility] and he would like to change it to [facility]. He also had a letter from [place] regarding application for marketplace coverage. He requested that this CM assist him with changing his medical home.  Call placed to [employer] # [phone].  Spoke to [place] and changed the medical home to [facility].  She also spoke to the patient to receive his approval for this change.  She noted that this change is effective 12/26/2019 and he will be receiving a new medicaid card. Alfred Mercado said that the patient has full medicaid and does not need to follow up with the application for marketplace coverage.    Call reference # [id number].   This CM instructed patient to call SCAT to schedule his assessment.  He said that he has the phone number for SCAT.  Also provided him with the phone number for medicaid to register for transportation services.

## 2019-12-15 NOTE — Assessment & Plan Note (Signed)
Pleural effusion on the right secondary to acute heart failure with preserved ejection fraction  Plan is to increase furosemide to 40 mg twice a day and obtain a chest x-ray  This patient may yet require repeat thoracentesis

## 2019-12-15 NOTE — Assessment & Plan Note (Signed)
Chronic heart failure with preserved ejection fraction and increased volume status  Plan is to increase furosemide to 40 mg twice daily

## 2019-12-15 NOTE — Patient Instructions (Signed)
Increase furosemide to one twice a day  Obtain a chest xray at [facility]  Your appointment with Cardiology is on [facility]  No other medication changes  Return Dr Alvis Lemmings in one month

## 2019-12-15 NOTE — Progress Notes (Signed)
Breathing   Eliquis   Need eye doctor

## 2019-12-17 ENCOUNTER — Telehealth: Payer: Self-pay

## 2019-12-17 ENCOUNTER — Ambulatory Visit: Payer: Medicare Other | Admitting: Cardiovascular Disease

## 2019-12-17 NOTE — Telephone Encounter (Signed)
Call received from [facility]/SCAT who confirmed that the patient has been certified for SCAT.   She will be sending him a letter. She noted that she is not meeting with individuals at this time and is basing the decision on the application information

## 2020-01-07 ENCOUNTER — Ambulatory Visit (INDEPENDENT_AMBULATORY_CARE_PROVIDER_SITE_OTHER): Payer: Medicare Other | Admitting: Cardiovascular Disease

## 2020-01-07 ENCOUNTER — Telehealth: Payer: Self-pay

## 2020-01-07 ENCOUNTER — Encounter: Payer: Self-pay | Admitting: Cardiovascular Disease

## 2020-01-07 ENCOUNTER — Other Ambulatory Visit: Payer: Self-pay

## 2020-01-07 VITALS — BP 128/52 | HR 76 | Ht 74.0 in | Wt 197.5 lb

## 2020-01-07 DIAGNOSIS — I4891 Unspecified atrial fibrillation: Secondary | ICD-10-CM | POA: Diagnosis not present

## 2020-01-07 DIAGNOSIS — I5032 Chronic diastolic (congestive) heart failure: Secondary | ICD-10-CM

## 2020-01-07 LAB — CBC WITH DIFFERENTIAL/PLATELET
Basophils Absolute: 0.1 10*3/uL (ref 0.0–0.2)
Basos: 1 %
EOS (ABSOLUTE): 0.1 10*3/uL (ref 0.0–0.4)
Eos: 1 %
Hematocrit: 37.6 % (ref 37.5–51.0)
Hemoglobin: 13.4 g/dL (ref 13.0–17.7)
Immature Grans (Abs): 0.1 10*3/uL (ref 0.0–0.1)
Immature Granulocytes: 1 %
Lymphocytes Absolute: 1.3 10*3/uL (ref 0.7–3.1)
Lymphs: 15 %
MCH: 30.9 pg (ref 26.6–33.0)
MCHC: 35.6 g/dL (ref 31.5–35.7)
MCV: 87 fL (ref 79–97)
Monocytes Absolute: 0.7 10*3/uL (ref 0.1–0.9)
Monocytes: 7 %
Neutrophils Absolute: 6.5 10*3/uL (ref 1.4–7.0)
Neutrophils: 75 %
Platelets: 284 10*3/uL (ref 150–450)
RBC: 4.33 x10E6/uL (ref 4.14–5.80)
RDW: 12.1 % (ref 11.6–15.4)
WBC: 8.8 10*3/uL (ref 3.4–10.8)

## 2020-01-07 LAB — BASIC METABOLIC PANEL
BUN/Creatinine Ratio: 13 (ref 10–24)
BUN: 17 mg/dL (ref 8–27)
CO2: 23 mmol/L (ref 20–29)
Calcium: 9.5 mg/dL (ref 8.6–10.2)
Chloride: 97 mmol/L (ref 96–106)
Creatinine, Ser: 1.28 mg/dL — ABNORMAL HIGH (ref 0.76–1.27)
GFR calc Af Amer: 60 mL/min/{1.73_m2} (ref >59)
GFR calc non Af Amer: 52 mL/min/{1.73_m2} — ABNORMAL LOW (ref >59)
Glucose: 102 mg/dL — ABNORMAL HIGH (ref 65–99)
Potassium: 3.5 mmol/L (ref 3.5–5.2)
Sodium: 140 mmol/L (ref 134–144)

## 2020-01-07 NOTE — Patient Instructions (Addendum)
Medication Instructions:  Call us as soon as you get home to confirm your medications. [phone]. *If you need a refill on your cardiac medications before your next appointment, please call your pharmacy*  Lab Work: TODAY! BMET, CBC If you have labs (blood work) drawn today and your tests are completely normal, you will receive your results only by: Marland Kitchen MyChart Message (if you have MyChart) OR . A paper copy in the mail If you have any lab test that is abnormal or we need to change your treatment, we will call you to review the results.  Follow-Up: You have an appointment with Dr. Harvie Bridge assistant on 02/04/2020 at 10:45AM.

## 2020-01-07 NOTE — Progress Notes (Signed)
Cardiology Office Note:    Date:  01/08/2020   ID:  Alfred Mercado, DOB 03/03/1936, MRN [id number]  PCP:  Hoy Register, MD  Cardiologist:  No primary care provider on file.  Electrophysiologist:  None   Referring MD: Hoy Register, MD   Chief Complaint  Patient presents with  . Atrial Fibrillation    History of Present Illness:    Alfred Mercado is a 84 y.o. male with a hx of hospitalization in February, 2021 for atrial fibrillation with rapid ventricular response and congestive heart failure..  Patient has a history of atrial fibrillation with congestive heart failure.  Echocardiogram in November, 2020 reveals normal left ventricular systolic function with an EF of 50 to 55%.  The diastolic function could not be evaluated due to atrial fibrillation.  During his hospitalization he was found to have a large right pleural effusion.  Has been relatively healthy for many years .   Worked in [employer] for years .   Was very active   Has persistent AF. He is asymptomatic and cannot tell that his heart is beating irregularly.  He denies any chest pain or shortness of breath.  Still smokes.  He may have COPD  He is on warfarin. 10 mg a day ( ???)  His last INR level was March 23.  INR was 3.0 at that time.   Past Medical History:  Diagnosis Date  . Acute heart failure with preserved ejection fraction (HCC)   . Atrial fibrillation with rapid ventricular response (HCC) 07/11/2019    Past Surgical History:  Procedure Laterality Date  . APPENDECTOMY      Current Medications: Current Meds  Medication Sig  . acetaminophen (TYLENOL) 325 MG tablet Take 650 mg by mouth every 6 (six) hours as needed for mild pain or headache.  Marland Kitchen apixaban (ELIQUIS) 5 MG TABS tablet Take 5 mg by mouth 2 (two) times daily.  Marland Kitchen diltiazem (CARDIZEM CD) 180 MG 24 hr capsule Take 1 capsule (180 mg total) by mouth daily.  . ergocalciferol (DRISDOL) 1.25 MG (50000 UT) capsule Take 1 capsule (50,000  Units total) by mouth once a week.  . furosemide (LASIX) 40 MG tablet Take 40 mg by mouth 2 (two) times daily. If your weight goes up by 2-3 pounds in a day or 5 pounds in a week, take one extra tab (40 mg) of lasix   . metoprolol tartrate (LOPRESSOR) 25 MG tablet Take 1 tablet (25 mg total) by mouth 2 (two) times daily.  . nicotine (NICODERM CQ - DOSED IN MG/24 HOURS) 21 mg/24hr patch Place 1 patch (21 mg total) onto the skin daily.     Allergies:   Patient has no known allergies.   Social History   Socioeconomic History  . Marital status: Divorced    Spouse name: Not on file  . Number of children: Not on file  . Years of education: Not on file  . Highest education level: Not on file  Occupational History  . Not on file  Tobacco Use  . Smoking status: Light Tobacco Smoker  . Smokeless tobacco: Never Used  Substance and Sexual Activity  . Alcohol use: Not Currently  . Drug use: Never  . Sexual activity: Not Currently  Other Topics Concern  . Not on file  Social History Narrative  . Not on file   Social Determinants of Health   Financial Resource Strain:   . Difficulty of Paying Living Expenses:   Food Insecurity:   . Worried About  Running Out of Food in the Last Year:   . [place] in the Last Year:   Transportation Needs:   . Lack of Transportation (Medical):   Marland Kitchen Lack of Transportation (Non-Medical):   Physical Activity:   . Days of Exercise per Week:   . Minutes of Exercise per Session:   Stress:   . Feeling of Stress :   Social Connections:   . Frequency of Communication with Friends and Family:   . Frequency of Social Gatherings with Friends and Family:   . Attends Religious Services:   . Active Member of Clubs or Organizations:   . Attends Archivist Meetings:   Marland Kitchen Marital Status:      Family History: The patient's family history includes Aneurysm in his father.  ROS:   Please see the history of present illness.     All other systems  reviewed and are negative.  EKGs/Labs/Other Studies Reviewed:    The following studies were reviewed today:   Recent Labs: 07/11/2019: TSH 0.747 10/14/2019: ALT 29; B Natriuretic Peptide 320.5 10/17/2019: Magnesium 1.9 01/07/2020: BUN 17; Creatinine, Ser 1.28; Hemoglobin 13.4; Platelets 284; Potassium 3.5; Sodium 140  Recent Lipid Panel    Component Value Date/Time   CHOL 147 07/13/2019 0457   TRIG 77 07/13/2019 0457   HDL 38 (L) 07/13/2019 0457   CHOLHDL 3.9 07/13/2019 0457   VLDL 15 07/13/2019 0457   LDLCALC 94 07/13/2019 0457    Physical Exam:    VS:  BP (!) 128/52   Pulse 76   Ht 6\' 2"  (1.88 m)   Wt 197 lb 8 oz (89.6 kg)   SpO2 97%   BMI 25.36 kg/m     Wt Readings from Last 3 Encounters:  01/07/20 197 lb 8 oz (89.6 kg)  12/15/19 203 lb 3.2 oz (92.2 kg)  11/11/19 205 lb 3.2 oz (93.1 kg)     GEN: Elderly gentleman, no acute distress HEENT: Normal NECK: No JVD; No carotid bruits LYMPHATICS: No lymphadenopathy CARDIAC: Irregularly irregular RESPIRATORY:  Clear to auscultation without rales, wheezing or rhonchi  ABDOMEN: Soft, non-tender, non-distended MUSCULOSKELETAL:  No edema; No deformity  SKIN: Warm and dry NEUROLOGIC:  Alert and oriented x 3 PSYCHIATRIC:  Normal affect    EKG:   Jan 07, 2020: Atrial fibrillation with a controlled ventricular response of 78.   ASSESSMENT:    1. Atrial fibrillation, unspecified type ([place])    PLAN:    In order of problems listed above:  1. Atrial fibrillation: The patient presents for follow-up of atrial fibrillation.  His rate is well controlled.  He is on Coumadin.  We will get him enrolled in the Coumadin clinic.  At this point I do not think he needs any additional medications.  2.  Chronic diastolic congestive heart failure: He was admitted with chronic congestive heart failure.  I have advised him to watch his salt intake.  Continue current medications.   Medication Adjustments/Labs and Tests Ordered: Current  medicines are reviewed at length with the patient today.  Concerns regarding medicines are outlined above.  Orders Placed This Encounter  Procedures  . Basic metabolic panel  . CBC with Differential/Platelet  . EKG 12-Lead   No orders of the defined types were placed in this encounter.   Patient Instructions  Medication Instructions:  Call us as soon as you get home to confirm your medications. [phone]. *If you need a refill on your cardiac medications before your next appointment, please  call your pharmacy*  Lab Work: TODAY! BMET, CBC If you have labs (blood work) drawn today and your tests are completely normal, you will receive your results only by: Marland Kitchen MyChart Message (if you have MyChart) OR . A paper copy in the mail If you have any lab test that is abnormal or we need to change your treatment, we will call you to review the results.  Follow-Up: You have an appointment with Dr. Harvie Bridge assistant on 02/04/2020 at 10:45AM.    Signed, Kristeen Miss, MD  01/08/2020 4:43 PM    [facility]

## 2020-01-07 NOTE — Telephone Encounter (Signed)
The patient was in today for NP visit with Dr. Elease Hashimoto and there was much confusion on medications he is taking. The patient is not sure whether he is on Coumadin or Eliquis.  Informed the patient he will be called this afternoon at 1:00PM to review medications. He agrees with treatment plan.

## 2020-01-07 NOTE — Telephone Encounter (Signed)
Reviewed medications in detail with patient.  Med list updated to reflect: 1) the patient is NOT taking Coumadin  2) he is taking Eliquis 5 mg BIG 3) he is taking Lasix 40 mg BID (not qd)  Confirmed with the patient upcoming follow-up appointment and reiterated to him to bring medications to that visit. He was grateful for assistance.

## 2020-02-01 ENCOUNTER — Ambulatory Visit: Payer: Medicare Other | Admitting: Family Medicine

## 2020-02-03 ENCOUNTER — Telehealth: Payer: Self-pay | Admitting: *Deleted

## 2020-02-03 NOTE — Telephone Encounter (Signed)
Pt called back and has rescheduled for 03/07/20 with Georgie Chard, NP

## 2020-02-03 NOTE — Telephone Encounter (Signed)
Call placed to pt re: appt 02/04/20 having to be cancelled and rescheduled.  Pt lives at a hotel and didn't answer the phone. Called back to the front desk and left a message with the manager to relay to pt to call the office re: change in appt. When pt calls back, please reschedule appt.

## 2020-02-04 ENCOUNTER — Ambulatory Visit: Payer: Medicare Other | Admitting: Physician Assistant

## 2020-02-05 ENCOUNTER — Telehealth: Payer: Self-pay

## 2020-02-05 NOTE — Telephone Encounter (Signed)
Transferred call from front desk, spoke with pt DOB and name verified.  Stated since morning has been feeling more SOB than usually. Denies chest pain or diff breathing at this time. Unable to check his vitals at home. Has not been able to reach his cardiologist as the yesterday appt has been canceled and rescheduled sometimes in July.  Advised pt to walk in at the UC/Ed and seek further evaluation. Stated he will wait for another hour or so and if not getting better will walk in into the ED.Pt is aware if condition is worsening or start experiencing any of diff breathing,  chest pain, extreme fatigue,  Persistent nausea and vomiting, bleeding , severe uncontrolled pain, or visual disturbances he must be seen immediately in the ED.  Pt agreed

## 2020-02-06 ENCOUNTER — Other Ambulatory Visit: Payer: Self-pay

## 2020-02-06 ENCOUNTER — Inpatient Hospital Stay (HOSPITAL_COMMUNITY)
Admission: AD | Admit: 2020-02-06 | Discharge: 2020-02-09 | DRG: 291 | Disposition: A | Discharge status: Home / Self Care | Payer: Medicare Other | Attending: Internal Medicine | Admitting: Internal Medicine

## 2020-02-06 ENCOUNTER — Emergency Department (HOSPITAL_COMMUNITY): Payer: Medicare Other

## 2020-02-06 DIAGNOSIS — J90 Pleural effusion, not elsewhere classified: Secondary | ICD-10-CM

## 2020-02-06 DIAGNOSIS — J439 Emphysema, unspecified: Secondary | ICD-10-CM | POA: Diagnosis present

## 2020-02-06 DIAGNOSIS — I5033 Acute on chronic diastolic (congestive) heart failure: Secondary | ICD-10-CM | POA: Diagnosis not present

## 2020-02-06 DIAGNOSIS — I110 Hypertensive heart disease with heart failure: Secondary | ICD-10-CM | POA: Diagnosis not present

## 2020-02-06 DIAGNOSIS — Z9111 Patient's noncompliance with dietary regimen: Secondary | ICD-10-CM

## 2020-02-06 DIAGNOSIS — I5031 Acute diastolic (congestive) heart failure: Secondary | ICD-10-CM | POA: Diagnosis present

## 2020-02-06 DIAGNOSIS — Z79899 Other long term (current) drug therapy: Secondary | ICD-10-CM

## 2020-02-06 DIAGNOSIS — E559 Vitamin D deficiency, unspecified: Secondary | ICD-10-CM | POA: Diagnosis present

## 2020-02-06 DIAGNOSIS — J9600 Acute respiratory failure, unspecified whether with hypoxia or hypercapnia: Secondary | ICD-10-CM | POA: Diagnosis present

## 2020-02-06 DIAGNOSIS — Z8249 Family history of ischemic heart disease and other diseases of the circulatory system: Secondary | ICD-10-CM

## 2020-02-06 DIAGNOSIS — Z66 Do not resuscitate: Secondary | ICD-10-CM | POA: Diagnosis present

## 2020-02-06 DIAGNOSIS — H9190 Unspecified hearing loss, unspecified ear: Secondary | ICD-10-CM | POA: Diagnosis present

## 2020-02-06 DIAGNOSIS — Z20822 Contact with and (suspected) exposure to covid-19: Secondary | ICD-10-CM | POA: Diagnosis present

## 2020-02-06 DIAGNOSIS — K5900 Constipation, unspecified: Secondary | ICD-10-CM | POA: Diagnosis present

## 2020-02-06 DIAGNOSIS — F17210 Nicotine dependence, cigarettes, uncomplicated: Secondary | ICD-10-CM | POA: Diagnosis present

## 2020-02-06 DIAGNOSIS — I4821 Permanent atrial fibrillation: Secondary | ICD-10-CM | POA: Diagnosis present

## 2020-02-06 DIAGNOSIS — Z7901 Long term (current) use of anticoagulants: Secondary | ICD-10-CM

## 2020-02-06 DIAGNOSIS — R0602 Shortness of breath: Secondary | ICD-10-CM

## 2020-02-06 LAB — CBC
HCT: 39.6 % (ref 39.0–52.0)
Hemoglobin: 13.2 g/dL (ref 13.0–17.0)
MCH: 30.6 pg (ref 26.0–34.0)
MCHC: 33.3 g/dL (ref 30.0–36.0)
MCV: 91.7 fL (ref 80.0–100.0)
Platelets: 267 10*3/uL (ref 150–400)
RBC: 4.32 MIL/uL (ref 4.22–5.81)
RDW: 12.8 % (ref 11.5–15.5)
WBC: 7.1 10*3/uL (ref 4.0–10.5)
nRBC: 0 % (ref 0.0–0.2)

## 2020-02-06 LAB — BASIC METABOLIC PANEL
Anion gap: 10 (ref 5–15)
BUN: 14 mg/dL (ref 8–23)
CO2: 28 mmol/L (ref 22–32)
Calcium: 8.8 mg/dL — ABNORMAL LOW (ref 8.9–10.3)
Chloride: 102 mmol/L (ref 98–111)
Creatinine, Ser: 1.18 mg/dL (ref 0.61–1.24)
GFR calc Af Amer: >60 mL/min (ref >60)
GFR calc non Af Amer: 57 mL/min — ABNORMAL LOW (ref >60)
Glucose, Bld: 129 mg/dL — ABNORMAL HIGH (ref 70–99)
Potassium: 3.7 mmol/L (ref 3.5–5.1)
Sodium: 140 mmol/L (ref 135–145)

## 2020-02-06 LAB — HEPATIC FUNCTION PANEL
ALT: 18 U/L (ref 0–44)
AST: 19 U/L (ref 15–41)
Albumin: 3.4 g/dL — ABNORMAL LOW (ref 3.5–5.0)
Alkaline Phosphatase: 76 U/L (ref 38–126)
Bilirubin, Direct: 0.2 mg/dL (ref 0.0–0.2)
Indirect Bilirubin: 0.9 mg/dL (ref 0.3–0.9)
Total Bilirubin: 1.1 mg/dL (ref 0.3–1.2)
Total Protein: 6.1 g/dL — ABNORMAL LOW (ref 6.5–8.1)

## 2020-02-06 LAB — SARS CORONAVIRUS 2 BY RT PCR (HOSPITAL ORDER, PERFORMED IN ~~LOC~~ HOSPITAL LAB): SARS Coronavirus 2: NEGATIVE

## 2020-02-06 MED ORDER — SODIUM CHLORIDE 0.9% FLUSH: 3.0000 mL | INTRAVENOUS | Status: DC | PRN

## 2020-02-06 MED ORDER — ONDANSETRON HCL 4 MG/2ML IJ SOLN: 4.0000 mg | Freq: Four times a day (QID) | INTRAMUSCULAR | Status: DC | PRN

## 2020-02-06 MED ORDER — FUROSEMIDE 10 MG/ML IJ SOLN
80.0000 mg | Freq: Two times a day (BID) | INTRAMUSCULAR | Status: DC
Start: 2020-02-06 — End: 2020-02-06

## 2020-02-06 MED ORDER — ACETAMINOPHEN 325 MG PO TABS
650.0000 mg | ORAL_TABLET | ORAL | Status: DC | PRN
  Administered 2020-02-06 – 2020-02-08 (×3): 650 mg via ORAL
  Filled 2020-02-06 (×3): qty 2

## 2020-02-06 MED ORDER — SODIUM CHLORIDE 0.9% FLUSH
3.0000 mL | Freq: Two times a day (BID) | INTRAVENOUS | Status: DC
  Administered 2020-02-07 – 2020-02-09 (×5): 3 mL via INTRAVENOUS

## 2020-02-06 MED ORDER — DILTIAZEM HCL ER COATED BEADS 180 MG PO CP24
180.0000 mg | ORAL_CAPSULE | Freq: Every day | ORAL | Status: DC
  Administered 2020-02-07 – 2020-02-09 (×3): 180 mg via ORAL
  Filled 2020-02-06 (×3): qty 1

## 2020-02-06 MED ORDER — METOPROLOL TARTRATE 25 MG PO TABS
25.0000 mg | ORAL_TABLET | Freq: Two times a day (BID) | ORAL | Status: DC
  Administered 2020-02-06 – 2020-02-09 (×6): 25 mg via ORAL
  Filled 2020-02-06 (×6): qty 1

## 2020-02-06 MED ORDER — SODIUM CHLORIDE 0.9 % IV SOLN: 250.0000 mL | INTRAVENOUS | Status: DC | PRN

## 2020-02-06 MED ORDER — FUROSEMIDE 10 MG/ML IJ SOLN
40.0000 mg | Freq: Two times a day (BID) | INTRAMUSCULAR | Status: DC
  Administered 2020-02-06 – 2020-02-09 (×6): 40 mg via INTRAVENOUS
  Filled 2020-02-06 (×6): qty 4

## 2020-02-06 MED ORDER — NICOTINE 21 MG/24HR TD PT24
21.0000 mg | MEDICATED_PATCH | Freq: Every day | TRANSDERMAL | Status: DC
  Administered 2020-02-06 – 2020-02-09 (×4): 21 mg via TRANSDERMAL
  Filled 2020-02-06 (×4): qty 1

## 2020-02-06 MED ORDER — SENNOSIDES-DOCUSATE SODIUM 8.6-50 MG PO TABS: 1.0000 | ORAL_TABLET | Freq: Every evening | ORAL | Status: DC | PRN

## 2020-02-06 NOTE — H&P (Signed)
Date: 02/06/2020               Patient Name:  Linville Decarolis MRN: [id number]  DOB: 10-10-1935 Age / Sex: 84 y.o., male   PCP: Charlott Rakes, MD         Medical Service: Internal Medicine Teaching Service         Attending Physician: Dr. Bartholomew Crews, MD    First Contact: Dr. [name] Pager: [phone]  Second Contact: Dr. Gilberto Better Pager: [phone]       After Hours (After 5p/  First Contact Pager: [phone]  weekends / holidays): Second Contact Pager: [phone]   Chief Complaint: Shortness of breath  History of Present Illness:   [name] is an 84 yo M w/ PMH of HFpEF and persistent A.fib presenting to [facility] with complaints of dyspnea. He was examined and evaluated at bedside this am in ED. He states he was in his usual state of health until yesterday when he developed a 'breathing problem.' He mentions overnight having worsening dyspnea that felt similar to his prior heart failure exacerbations. He also had some dizziness and light-headedness but denies any falls. He came to the ED for evaluation this am after speaking with on-call staff at [facility]. He mentions hx of 2 episodes of prior symptoms regarding thoracentesis to drain his pleural fluids. He mentions disliking the procedure due to the associated pain. He denies missing any doses of his diuretics. He mentions trying to pursue a low sodium diet but mentions eating a lot of frozen dinners. Mentions drinking lot of diet soda at home.  On review of systems, he denies orthopnea, chest pain, palpitations or lower extremity edema. He mentions occasional coughs but no significant sputum production. Denies any fevers, chills, sick contact. Denies any nausea, vomiting or diarrhea. Does endorse constipation. He mentions some housing insecurity requiring him to live in a motel. Mentions no family and friends nearby to help him out.  In the ED, he was found to have enlarged right pleural effusion. Pulm  was consulted who planned for thoracentesis tomorrow am. IMTS was consulted for admission.  Meds:  Eliquis 2mg  Bid Diltiazem ER 180mg  daily Ergocalciferol 1.25mg  weekly Furosemide 40mg  BID Metoprolol 25mg  BID Nictoderm 21mg  daily  Allergies: Allergies as of 02/06/2020   (No Known Allergies)   Past Medical History:  Diagnosis Date   Acute heart failure with preserved ejection fraction ([place])    Atrial fibrillation with rapid ventricular response ([place]) 07/11/2019    Family History: Unable to provide further details.  Family History  Problem Relation Age of Onset   Aneurysm Father    Social History: Lives at [employer] for 30 years. Friends with Event organiser. Smokes for 50 years quarter pack a day. Using nicotine patch to quit. Denies alcohol, illicit substance use.  Review of Systems: A complete ROS was negative except as per HPI.   Physical Exam: Blood pressure (!) 148/80, pulse 85, temperature 98 F (36.7 C), temperature source Oral, resp. rate (!) 21, height 6\' 2"  (1.88 m), weight 89.5 kg, SpO2 98 %.  Gen: Well-developed, well nourished, NAD HEENT: NCAT head, hearing intact, EOMI, MMM Neck: supple, ROM intact, no JVD CV: irregularly irregular, S1, S2 normal, No rubs, no murmurs Pulm: Decreased right sound on R mid/lower lobe w/ dullness to percussion, increased work of breathing Abd: Soft, BS+, Distended abdominal wall without ascites Extm: ROM intact, Peripheral pulses intact, trace ankle edema Skin: Dry, Warm, normal turgor Neuro: AAOx3  EKG: personally reviewed my interpretation is irregularly irregular, motion artifact, no ischemic changes  CXR: personally reviewed my interpretation is large right sided pleural effusion increased in size from prior  Assessment & Plan by Problem: Active Problems:   Acute diastolic CHF (congestive heart failure) (HCC)  [name] is an 84 yo M w/ PMH of HFpEF and persistent A.fib presenting to [facility] with complaints of dyspnea  2/2 acute on chronic diastolic heart failure exacerbation with pleural effusion  Acute on chronic diastolic heart failure Right sided pleural effusion Satting 96 on RA but RR >25. Chest X-ray w/ worsening effusion and vascular congestion. Had thoracentesis performed multiple times in the past consistent w/ transudative effusion. Likely from his HFpEF. No current weight in ED but last known dry weight 197lbs. Lungs sound wet.  On furosemide 40 BID at home - Appreciate pulm recs: thoracentesis tomorrow am - Start IV Furosemide 40 mg BID - Trend BMP - Mag level - Strict I&Os - Daily Weights - Fluid restriction - Keep O2 sat >88 - Replenish K as needed >4.0  Persistent Atrial Fibrillation In persistent A.fib. On metoprolol, diltiazem at home. HR 90-100s currently. Denies any palpitations. CHAD-VASC2 score of 4. Has-bled score of 2. Currently on eliquis. Took am dose prior to admission. - C/w metoprolol, diltiazem - Telemetry - Holding eliquis for procedure tomorrow  Vitamin D deficiency Takes vitamin D at home. Calcium 8.8 this admission - C/w home meds  Tobacco use Disorder - C/w home meds: nicotine patch  DVT prophx: Eliquis Diet: Cardiac w/ fluid restriction Bowel: Senokot Code: DNR  Prior to Admission Living Arrangement: Home  Anticipated Discharge Location: Home Barriers to Discharge: Medical management  Dispo: Admit patient to Observation with expected length of stay less than 2 midnights.  Signed: Theotis Barrio, MD 02/06/2020, 12:36 PM  Pager:  After 5pm on weekdays and 1pm on weekends: On Call pager: [phone]

## 2020-02-06 NOTE — ED Triage Notes (Signed)
Pt reports intermittent shortness of breath even at rest over the last two days. Talked to a triage nurse on the phone yesterday who told him if his symptoms worsened/persisted to come to the ED. Currently trying to quit smoking. Also says that during the night he got up to use the bathroom and was dizzy when he stood, but dizziness resolved.

## 2020-02-06 NOTE — Plan of Care (Signed)
Case reviewed with PCCM physician Pt admitted 6/12  with symptomatic R pleural effusion. Previously, pt has required thoracentesis for this. Patient takes eliquis for Atrial Fibrillation.   Discussed with primary team-- please continue to hold eliquis in preparation for procedure. PCCM will see the patient Monday 6/14 to plan for thoracentesis.    Tessie Fass MSN, AGACNP-BC [facility] [phone] If no answer, [phone] 02/06/2020, 4:12 PM

## 2020-02-06 NOTE — ED Notes (Signed)
PT assisted to call the motel where he is staying to notify them that he will be admitted to the hospital

## 2020-02-06 NOTE — ED Provider Notes (Signed)
[facility] EMERGENCY DEPARTMENT Provider Note   CSN: [id number] Arrival date & time: 02/06/20  6195     History Chief Complaint  Patient presents with  . Shortness of Breath    Alfred Mercado is a 84 y.o. male.  HPI    84 y/o males with hx of CHF and AF on DOAC comes in with cc of DIB. He also has hx of pleural effusion that has required thoracentesis. Pt has had 2 days of shortness of breath. DIB is exertional.  No n/v/f/c/palpitations/chest pain. No new cough.  Past Medical History:  Diagnosis Date  . Acute heart failure with preserved ejection fraction ([place])   . Atrial fibrillation with rapid ventricular response ([place]) 07/11/2019    Patient Active Problem List   Diagnosis Date Noted  . Atrial fibrillation with RVR ([place]) 10/14/2019  . Pleural effusion, right   . Chronic heart failure with preserved ejection fraction [facility])     Past Surgical History:  Procedure Laterality Date  . APPENDECTOMY         Family History  Problem Relation Age of Onset  . Aneurysm Father     Social History   Tobacco Use  . Smoking status: Light Tobacco Smoker  . Smokeless tobacco: Never Used  Substance Use Topics  . Alcohol use: Not Currently  . Drug use: Never    Home Medications Prior to Admission medications   Medication Sig Start Date End Date Taking? Authorizing Provider  acetaminophen (TYLENOL) 325 MG tablet Take 650 mg by mouth every 6 (six) hours as needed for mild pain or headache.    [provider]  apixaban (ELIQUIS) 5 MG TABS tablet Take 5 mg by mouth 2 (two) times daily.    [provider]  diltiazem (CARDIZEM CD) 180 MG 24 hr capsule Take 1 capsule (180 mg total) by mouth daily. 11/11/19   Charlott Rakes, MD  ergocalciferol (DRISDOL) 1.25 MG (50000 UT) capsule Take 1 capsule (50,000 Units total) by mouth once a week. 11/12/19   Charlott Rakes, MD  furosemide (LASIX) 40 MG tablet Take 40 mg by mouth 2 (two) times daily. If  your weight goes up by 2-3 pounds in a day or 5 pounds in a week, take one extra tab (40 mg) of lasix     [provider]  metoprolol tartrate (LOPRESSOR) 25 MG tablet Take 1 tablet (25 mg total) by mouth 2 (two) times daily. 11/11/19   Charlott Rakes, MD  nicotine (NICODERM CQ - DOSED IN MG/24 HOURS) 21 mg/24hr patch Place 1 patch (21 mg total) onto the skin daily. 10/19/19   Lucky Cowboy, MD    Allergies    Patient has no known allergies.  Review of Systems   Review of Systems  Constitutional: Positive for activity change.  Respiratory: Positive for shortness of breath.   Cardiovascular: Negative for chest pain.  Gastrointestinal: Negative for nausea and vomiting.  Neurological: Negative for dizziness.  All other systems reviewed and are negative.   Physical Exam Updated Vital Signs BP (!) 148/80   Pulse 85   Temp 98 F (36.7 C) (Oral)   Resp (!) 21   Ht 6\' 2"  (1.88 m)   Wt 89.5 kg   SpO2 98%   BMI 25.33 kg/m   Physical Exam Vitals and nursing note reviewed.  Constitutional:      Appearance: He is well-developed.  HENT:     Head: Normocephalic and atraumatic.  Eyes:     Conjunctiva/sclera:  Conjunctivae normal.     Pupils: Pupils are equal, round, and reactive to light.  Cardiovascular:     Rate and Rhythm: Normal rate and regular rhythm.  Pulmonary:     Effort: Pulmonary effort is normal.     Breath sounds: Examination of the right-middle field reveals decreased breath sounds. Examination of the right-lower field reveals decreased breath sounds. Decreased breath sounds present. No wheezing or rales.  Abdominal:     General: Bowel sounds are normal. There is no distension.     Palpations: Abdomen is soft.  Musculoskeletal:        General: No deformity.     Cervical back: Normal range of motion and neck supple.     Right lower leg: No edema.     Left lower leg: No edema.  Skin:    General: Skin is warm.  Neurological:     Mental Status: He is alert  and oriented to person, place, and time.     ED Results / Procedures / Treatments   Labs (all labs ordered are listed, but only abnormal results are displayed) Labs Reviewed  BASIC METABOLIC PANEL - Abnormal; Notable for the following components:      Result Value   Glucose, Bld 129 (*)    Calcium 8.8 (*)    GFR calc non Af Amer 57 (*)    All other components within normal limits  SARS CORONAVIRUS 2 BY RT PCR [facility] ORDER, PERFORMED IN [facility])  CBC    EKG EKG Interpretation  Date/Time:  Saturday February 06 2020 08:39:30 EDT Ventricular Rate:  86 PR Interval:    QRS Duration: 94 QT Interval:  376 QTC Calculation: 449 R Axis:   -57 Text Interpretation: Atrial fibrillation Left anterior fascicular block Nonspecific ST abnormality Abnormal ECG No acute changes No significant change since last tracing Confirmed by Derwood Kaplan [phone]) on 02/06/2020 9:41:28 AM   Radiology DG Chest 2 View  Result Date: 02/06/2020 CLINICAL DATA:  Shortness of breath over the last 2 days. EXAM: CHEST - 2 VIEW COMPARISON:  10/17/2019 FINDINGS: Lungs are adequately inflated demonstrate a new large right pleural effusion occupying approximately 1/2 of the right thorax likely with associated basilar atelectasis. Subtle prominence of the perihilar markings which may be due to a degree of vascular congestion. Cardiomediastinal silhouette and remainder of the exam is unchanged. IMPRESSION: 1. New large right pleural effusion occupying half the right thorax likely with associated basilar atelectasis. 2.  Suggestion of mild vascular congestion. Electronically Signed   By: Elberta Fortis M.D.   On: 02/06/2020 10:32    Procedures Procedures (including critical care time)  Medications Ordered in ED Medications - No data to display  ED Course  I have reviewed the triage vital signs and the nursing notes.  Pertinent labs & imaging results that were available during my care of the patient  were reviewed by me and considered in my medical decision making (see chart for details).    MDM Rules/Calculators/A&P                          84 year old male comes in a chief complaint of shortness of breath.  He has history of pleural effusions that have required thoracentesis.  He also has history of CHF and A. Fib.  History and exam are not suggestive of any underlying infection or ACS.  He does have diminished breath sounds on the right side, the same side  as the prior large pleural effusion.  Our chart review indicates that patient has required 1.5 and 1.2 L drained respectively with prior thoracentesis.  Patient will need admission to the hospital for recurrent pleural effusion. I have discussed the case with pulmonary critical care, they will likely proceed with thoracentesis tomorrow.  Final Clinical Impression(s) / ED Diagnoses Final diagnoses:  Pleural effusion  Exertional shortness of breath    Rx / DC Orders ED Discharge Orders    None       Derwood Kaplan, MD 02/06/20 1204

## 2020-02-06 NOTE — ED Notes (Signed)
PT is sitting up and eating his lunch

## 2020-02-06 NOTE — ED Notes (Signed)
Lunch tray ordered 

## 2020-02-07 DIAGNOSIS — Z66 Do not resuscitate: Secondary | ICD-10-CM | POA: Diagnosis present

## 2020-02-07 DIAGNOSIS — I110 Hypertensive heart disease with heart failure: Secondary | ICD-10-CM | POA: Diagnosis present

## 2020-02-07 DIAGNOSIS — I5033 Acute on chronic diastolic (congestive) heart failure: Secondary | ICD-10-CM | POA: Diagnosis present

## 2020-02-07 DIAGNOSIS — Z8249 Family history of ischemic heart disease and other diseases of the circulatory system: Secondary | ICD-10-CM | POA: Diagnosis not present

## 2020-02-07 DIAGNOSIS — I4819 Other persistent atrial fibrillation: Secondary | ICD-10-CM | POA: Diagnosis not present

## 2020-02-07 DIAGNOSIS — Z7901 Long term (current) use of anticoagulants: Secondary | ICD-10-CM | POA: Diagnosis not present

## 2020-02-07 DIAGNOSIS — Z720 Tobacco use: Secondary | ICD-10-CM | POA: Diagnosis not present

## 2020-02-07 DIAGNOSIS — I5031 Acute diastolic (congestive) heart failure: Secondary | ICD-10-CM | POA: Diagnosis not present

## 2020-02-07 DIAGNOSIS — J449 Chronic obstructive pulmonary disease, unspecified: Secondary | ICD-10-CM | POA: Diagnosis not present

## 2020-02-07 DIAGNOSIS — J9600 Acute respiratory failure, unspecified whether with hypoxia or hypercapnia: Secondary | ICD-10-CM | POA: Diagnosis present

## 2020-02-07 DIAGNOSIS — I4821 Permanent atrial fibrillation: Secondary | ICD-10-CM | POA: Diagnosis present

## 2020-02-07 DIAGNOSIS — H9190 Unspecified hearing loss, unspecified ear: Secondary | ICD-10-CM | POA: Diagnosis present

## 2020-02-07 DIAGNOSIS — J439 Emphysema, unspecified: Secondary | ICD-10-CM | POA: Diagnosis present

## 2020-02-07 DIAGNOSIS — J90 Pleural effusion, not elsewhere classified: Secondary | ICD-10-CM

## 2020-02-07 DIAGNOSIS — F17210 Nicotine dependence, cigarettes, uncomplicated: Secondary | ICD-10-CM | POA: Diagnosis present

## 2020-02-07 DIAGNOSIS — Z79899 Other long term (current) drug therapy: Secondary | ICD-10-CM | POA: Diagnosis not present

## 2020-02-07 DIAGNOSIS — J9601 Acute respiratory failure with hypoxia: Secondary | ICD-10-CM | POA: Diagnosis not present

## 2020-02-07 DIAGNOSIS — Z20822 Contact with and (suspected) exposure to covid-19: Secondary | ICD-10-CM | POA: Diagnosis present

## 2020-02-07 DIAGNOSIS — E559 Vitamin D deficiency, unspecified: Secondary | ICD-10-CM | POA: Diagnosis present

## 2020-02-07 DIAGNOSIS — Z9111 Patient's noncompliance with dietary regimen: Secondary | ICD-10-CM | POA: Diagnosis not present

## 2020-02-07 DIAGNOSIS — K5900 Constipation, unspecified: Secondary | ICD-10-CM | POA: Diagnosis present

## 2020-02-07 LAB — CBC
HCT: 39.2 % (ref 39.0–52.0)
Hemoglobin: 13 g/dL (ref 13.0–17.0)
MCH: 30.3 pg (ref 26.0–34.0)
MCHC: 33.2 g/dL (ref 30.0–36.0)
MCV: 91.4 fL (ref 80.0–100.0)
Platelets: 235 10*3/uL (ref 150–400)
RBC: 4.29 MIL/uL (ref 4.22–5.81)
RDW: 12.7 % (ref 11.5–15.5)
WBC: 6.2 10*3/uL (ref 4.0–10.5)
nRBC: 0 % (ref 0.0–0.2)

## 2020-02-07 LAB — BASIC METABOLIC PANEL
Anion gap: 10 (ref 5–15)
BUN: 13 mg/dL (ref 8–23)
CO2: 29 mmol/L (ref 22–32)
Calcium: 9.1 mg/dL (ref 8.9–10.3)
Chloride: 100 mmol/L (ref 98–111)
Creatinine, Ser: 1.27 mg/dL — ABNORMAL HIGH (ref 0.61–1.24)
GFR calc Af Amer: >60 mL/min (ref >60)
GFR calc non Af Amer: 52 mL/min — ABNORMAL LOW (ref >60)
Glucose, Bld: 175 mg/dL — ABNORMAL HIGH (ref 70–99)
Potassium: 3.6 mmol/L (ref 3.5–5.1)
Sodium: 139 mmol/L (ref 135–145)

## 2020-02-07 LAB — MAGNESIUM: Magnesium: 1.7 mg/dL (ref 1.7–2.4)

## 2020-02-07 MED ORDER — ALBUTEROL SULFATE (2.5 MG/3ML) 0.083% IN NEBU
2.5000 mg | INHALATION_SOLUTION | Freq: Once | RESPIRATORY_TRACT | Status: AC
  Administered 2020-02-07: 2.5 mg via RESPIRATORY_TRACT
  Filled 2020-02-07: qty 3

## 2020-02-07 MED ORDER — ALBUTEROL SULFATE (2.5 MG/3ML) 0.083% IN NEBU: 3.0000 mL | INHALATION_SOLUTION | Freq: Four times a day (QID) | RESPIRATORY_TRACT | Status: DC | PRN

## 2020-02-07 NOTE — Progress Notes (Signed)
Subjective:   Alfred Mercado was examined and evaluated at bedside this am. He mentions that he had an 'on and off' night due to continued dyspnea. Explained to Alfred Mercado per his request regarding possible origin of his pleural effusion and likely culprit being his diastolic heart failure. Discussed importance of medication adherence and low salt diet. Pt expressed understanding and appreciative of education. Discussed plan for thoracentesis tomorrow. All questions and concerns addressed.  Objective:  Vital signs in last 24 hours: Vitals:   02/06/20 1545 02/06/20 1942 02/07/20 0044 02/07/20 0425  BP: 132/76 124/72 (!) 128/54 120/68  Pulse: 98 77 84 81  Resp: 18 17 17 19   Temp: (!) 97.4 F (36.3 C) 98.2 F (36.8 C) 98.3 F (36.8 C) 97.9 F (36.6 C)  TempSrc: Oral Oral Oral Oral  SpO2: 98% 97% 94% 94%  Weight: 90.7 kg   88.9 kg  Height: 6\' 2"  (1.88 m)      Physical Exam Vitals and nursing note reviewed.  Constitutional:      General: He is not in acute distress.    Appearance: He is not ill-appearing.  Cardiovascular:     Rate and Rhythm: Normal rate. Rhythm irregular.     Heart sounds: Normal heart sounds.  Pulmonary:     Effort: Pulmonary effort is normal. Tachypnea (when sitting up/moving around in bed) present.     Breath sounds: Examination of the right-lower field reveals decreased breath sounds. Decreased breath sounds and wheezing (new diffuse wheezing throughout) present. No rhonchi or rales.     Comments: Normal respiratory effort on room air. Speaking in complete sentences. Musculoskeletal:     Comments: Mildly trace pitting edema.  Skin:    General: Skin is warm and dry.  Neurological:     Mental Status: He is alert.    Assessment/Plan:  Active Problems:   Acute diastolic CHF (congestive heart failure) (HCC)  Alfred Mercado is an 84 yo M w/ PMH of HFpEF and persistent A.fib presenting to [facility] with complaints of dyspnea 2/2 acute on chronic diastolic heart  failure exacerbation with recurrent right-sided pleural effusion  Acute on chronic diastolic heart failure Right sided pleural effusion Admission CXR with large R pleural effusion and vascular congestion. Pt required multiple prior thoracentesis for transudative effusions attributed to his HFpEF, last one 10/17/2019 with 1.4L removed. Admission weight 195lbs, down 2lbs from April and 8lbs from Feb. Home diuretic dose is 40mg  lasix PO BID. - suspect exacerbation secondary to dietary indiscretion at home (pt endorses eating frozen meals and friends who cook for him heavily salt the food) - Pulm consulted, plan for thoracentesis tomorrow after holding Eliquis - continue IV lasix 40mg  BID - net negative 880cc yesterday - AM labs not yet drawn - f/u BMP, K, and Mg - strict I/Os and daily weights - 1.5L fluid restriction   Wheezing Tobacco use Disorder  - current smoker for >50 years - trying to cut down with nicotine patch, continue while inpatient  - diffuse wheezing on asculation today - albuterol neb x 1 this AM, then PRN for wheezing - given significant smoking history, pt likely has underlying COPD - emphysema noted on 2/17 chest CT  Persistent Atrial Fibrillation In persistent A.fib. CHAD-VASC2 score of 4. On metoprolol, diltiazem, and Eliquis at home. Last Eliquis dose 6/12 in AM. - HR controlled in 70-90s - holding Eliquis for thora tomorrow - continue home metoprolol and diltiazem - tele  Vitamin D deficiency Calcium mildly low on admission at 8.8, though corrects  within normal range given mildly decreased albumin of 3.4. - continue home vitamin D supplementation   DVT prophx: Eliquis Diet: Cardiac w/ fluid restriction Fluids: none Code: DNR  Prior to Admission Living Arrangement: home Anticipated Discharge Location: home Barriers to Discharge: thoracentesis tomorrow Dispo: Anticipated discharge in approximately 1-2 day(s).   Alfred Horns, MD 02/07/2020, 6:13  AM Pager: [phone] After 5pm on weekdays and 1pm on weekends: On Call pager [phone]

## 2020-02-08 DIAGNOSIS — Z7901 Long term (current) use of anticoagulants: Secondary | ICD-10-CM

## 2020-02-08 DIAGNOSIS — J449 Chronic obstructive pulmonary disease, unspecified: Secondary | ICD-10-CM

## 2020-02-08 DIAGNOSIS — Z720 Tobacco use: Secondary | ICD-10-CM

## 2020-02-08 DIAGNOSIS — I4819 Other persistent atrial fibrillation: Secondary | ICD-10-CM

## 2020-02-08 DIAGNOSIS — I5031 Acute diastolic (congestive) heart failure: Secondary | ICD-10-CM

## 2020-02-08 DIAGNOSIS — E559 Vitamin D deficiency, unspecified: Secondary | ICD-10-CM

## 2020-02-08 LAB — BASIC METABOLIC PANEL
Anion gap: 9 (ref 5–15)
BUN: 16 mg/dL (ref 8–23)
CO2: 29 mmol/L (ref 22–32)
Calcium: 8.9 mg/dL (ref 8.9–10.3)
Chloride: 101 mmol/L (ref 98–111)
Creatinine, Ser: 1.22 mg/dL (ref 0.61–1.24)
GFR calc Af Amer: >60 mL/min (ref >60)
GFR calc non Af Amer: 55 mL/min — ABNORMAL LOW (ref >60)
Glucose, Bld: 114 mg/dL — ABNORMAL HIGH (ref 70–99)
Potassium: 3.3 mmol/L — ABNORMAL LOW (ref 3.5–5.1)
Sodium: 139 mmol/L (ref 135–145)

## 2020-02-08 LAB — MAGNESIUM: Magnesium: 1.9 mg/dL (ref 1.7–2.4)

## 2020-02-08 MED ORDER — ALBUTEROL SULFATE (2.5 MG/3ML) 0.083% IN NEBU
3.0000 mL | INHALATION_SOLUTION | RESPIRATORY_TRACT | Status: DC | PRN
  Administered 2020-02-08: 3 mL via RESPIRATORY_TRACT
  Filled 2020-02-08: qty 3

## 2020-02-08 MED ORDER — MAGNESIUM SULFATE 2 GM/50ML IV SOLN
2.0000 g | Freq: Once | INTRAVENOUS | Status: AC
  Administered 2020-02-08: 2 g via INTRAVENOUS
  Filled 2020-02-08: qty 50

## 2020-02-08 MED ORDER — APIXABAN 5 MG PO TABS
5.0000 mg | ORAL_TABLET | Freq: Two times a day (BID) | ORAL | Status: DC
  Administered 2020-02-08 – 2020-02-09 (×3): 5 mg via ORAL
  Filled 2020-02-08 (×3): qty 1

## 2020-02-08 MED ORDER — UMECLIDINIUM-VILANTEROL 62.5-25 MCG/INH IN AEPB
1.0000 | INHALATION_SPRAY | Freq: Every day | RESPIRATORY_TRACT | Status: DC
  Administered 2020-02-09: 1 via RESPIRATORY_TRACT
  Filled 2020-02-08: qty 14

## 2020-02-08 MED ORDER — POTASSIUM CHLORIDE CRYS ER 20 MEQ PO TBCR
40.0000 meq | EXTENDED_RELEASE_TABLET | Freq: Once | ORAL | Status: AC
  Administered 2020-02-08: 40 meq via ORAL
  Filled 2020-02-08: qty 2

## 2020-02-08 NOTE — Discharge Instructions (Addendum)
Information on my medicine - ELIQUIS (apixaban)  This medication education was reviewed with me or my healthcare representative as part of my discharge preparation.    Why was Eliquis prescribed for you? Eliquis was prescribed for you to reduce the risk of a blood clot forming that can cause a stroke if you have a medical condition called atrial fibrillation (a type of irregular heartbeat).  What do You need to know about Eliquis ? Take your Eliquis TWICE DAILY - one tablet in the morning and one tablet in the evening with or without food. If you have difficulty swallowing the tablet whole please discuss with your pharmacist how to take the medication safely.  Take Eliquis exactly as prescribed by your doctor and DO NOT stop taking Eliquis without talking to the doctor who prescribed the medication.  Stopping may increase your risk of developing a stroke.  Refill your prescription before you run out.  After discharge, you should have regular check-up appointments with your healthcare provider that is prescribing your Eliquis.  In the future your dose may need to be changed if your kidney function or weight changes by a significant amount or as you get older.  What do you do if you miss a dose? If you miss a dose, take it as soon as you remember on the same day and resume taking twice daily.  Do not take more than one dose of ELIQUIS at the same time to make up a missed dose.  Important Safety Information A possible side effect of Eliquis is bleeding. You should call your healthcare provider right away if you experience any of the following: ? Bleeding from an injury or your nose that does not stop. ? Unusual colored urine (red or dark brown) or unusual colored stools (red or black). ? Unusual bruising for unknown reasons. ? A serious fall or if you hit your head (even if there is no bleeding).  Some medicines may interact with Eliquis and might increase your risk of bleeding or  clotting while on Eliquis. To help avoid this, consult your healthcare provider or pharmacist prior to using any new prescription or non-prescription medications, including herbals, vitamins, non-steroidal anti-inflammatory drugs (NSAIDs) and supplements.  This website has more information on Eliquis (apixaban): http://www.eliquis.com/eliquis/home   Umeclidinium; Vilanterol inhalation powder  What is this medicine? UMECLIDINIUM; VILANTEROL (ue MEK li DIN ee um; vye LAN ter ol) inhalation is a combination of two medicines that decrease inflammation and help to open up the airways of your lungs. It is for chronic obstructive pulmonary disease (COPD), including chronic bronchitis or emphysema. Do NOT use for asthma or an acute asthma attack. Do NOT use for a COPD attack. This medicine may be used for other purposes; ask your health care provider or pharmacist if you have questions. COMMON BRAND NAME(S): ANORO ELLIPTA  What should I tell my health care provider before I take this medicine? They need to know if you have any of these conditions:  bladder problems or difficulty passing urine  diabetes  glaucoma  heart disease or irregular heartbeat  high blood pressure  kidney disease  pheochromocytoma  prostate disease  seizures  thyroid disease  an unusual or allergic reaction to umeclidinium, vilanterol, lactose, milk proteins, other medicines, foods, dyes, or preservatives  pregnant or trying to get pregnant  breast-feeding  How should I use this medicine? This medicine is inhaled through the mouth. It is used once per day. Follow the directions on the prescription label. Do  not use a spacer device with this inhaler. Take your medicine at regular intervals. Do not take your medicine more often than directed. Do not stop taking except on your doctor's advice. Make sure that you are using your inhaler correctly. Ask you doctor or health care provider if you have any  questions. Talk to your pediatrician regarding the use of this medicine in children. Special care may be needed. Overdosage: If you think you have taken too much of this medicine contact a poison control center or emergency room at once. NOTE: This medicine is only for you. Do not share this medicine with others.  What if I miss a dose? If you miss a dose, use it as soon as you can. If it is almost time for your next dose, use only that dose and continue with your regular schedule. Do not use double or extra doses.  What may interact with this medicine? Do not take this medicine with any of the following medications:  cisapride  dofetilide  dronedarone  MAOIs like Carbex, Eldepryl, Marplan, Nardil, and Parnate  pimozide  thioridazine  ziprasidone This medicine may also interact with the following medications:  antihistamines for allergy  antiviral medicines for HIV or AIDS  atropine  beta-blockers like metoprolol and propranolol  certain medicines for bladder problems like oxybutynin, tolterodine  certain medicines for depression, anxiety, or psychotic disturbances  certain medicines for Parkinson's disease like benztropine, trihexyphenidyl  certain medicines for stomach problems like dicyclomine, hyoscyamine  certain medicines for travel sickness like scopolamine  diuretics  ipratropium  medicines for colds  medicines for fungal infections like ketoconazole and itraconazole  other medicines for breathing problems  other medicines that prolong the QT interval (cause an abnormal heart rhythm)  tiotropium This list may not describe all possible interactions. Give your health care provider a list of all the medicines, herbs, non-prescription drugs, or dietary supplements you use. Also tell them if you smoke, drink alcohol, or use illegal drugs. Some items may interact with your medicine.  What should I watch for while using this medicine? Visit your doctor or  health care professional for regular checkups. Tell your doctor or health care professional if your symptoms do not get better. If your symptoms get worse or if you need your short-acting inhalers more often, call your doctor right away. Do not use this medicine more than once every 24 hours.  What side effects may I notice from receiving this medicine? Side effects that you should report to your doctor or health care professional as soon as possible:  allergic reactions like skin rash or hives, swelling of the face, lips, or tongue  breathing problems right after inhaling your medicine  changes in vision  chest pain  eye pain  fast, irregular heartbeat  feeling faint or lightheaded, falls  fever or chills  nausea, vomiting  trouble passing urine or change in the amount of urine Side effects that usually do not require medical attention (report to your doctor or health care professional if they continue or are bothersome):  constipation  cough  diarrhea  headache  muscle cramps  nervousness  sore throat  tremor This list may not describe all possible side effects. Call your doctor for medical advice about side effects. You may report side effects to FDA at 1-800-FDA-1088.  Where should I keep my medicine? Keep out of the reach of children. Store at room temperature between 15 and 30 degrees C (59 and 86 degrees F). Store in  a dry place away from direct heat or sunlight. Throw away 6 weeks after you remove the inhaler from the foil tray, or after the dose indicator reads 0, whichever comes first. Throw away any unopened packages after the expiration date. NOTE: This sheet is a summary. It may not cover all possible information. If you have questions about this medicine, talk to your doctor, pharmacist, or health care provider.  2020 Elsevier/Gold Standard (2018-01-27 14:47:24)   Albuterol inhalation aerosol  What is this medicine? ALBUTEROL (al Gaspar Bidding) is a  bronchodilator. It helps open up the airways in your lungs to make it easier to breathe. This medicine is used to treat and to prevent bronchospasm. This medicine may be used for other purposes; ask your health care provider or pharmacist if you have questions. COMMON BRAND NAME(S): Proair HFA, Proventil, Proventil HFA, Respirol, Ventolin, Ventolin HFA  What should I tell my health care provider before I take this medicine? They need to know if you have any of the following conditions:  diabetes  heart disease or irregular heartbeat  high blood pressure  pheochromocytoma  seizures  thyroid disease  an unusual or allergic reaction to albuterol, levalbuterol, other medicines, foods, dyes, or preservatives  pregnant or trying to get pregnant  breast-feeding  How should I use this medicine? This medicine is for inhalation through the mouth. Follow the directions on your prescription label. Take your medicine at regular intervals. Do not use more often than directed. Make sure that you are using your inhaler correctly. Ask your doctor or health care provider if you have any questions. Talk to your pediatrician regarding the use of this medicine in children. While this drug may be prescribed for children as young as 4 years for selected conditions, precautions do apply. Overdosage: If you think you have taken too much of this medicine contact a poison control center or emergency room at once. NOTE: This medicine is only for you. Do not share this medicine with others.  What if I miss a dose? If you miss a dose, use it as soon as you can. If it is almost time for your next dose, use only that dose. Do not use double or extra doses.  What may interact with this medicine?  anti-infectives like chloroquine and pentamidine  caffeine  cisapride  diuretics  medicines for colds  medicines for depression or for emotional or psychotic conditions  medicines for weight loss including  some herbal products  methadone  some antibiotics like clarithromycin, erythromycin, levofloxacin, and linezolid  some heart medicines  steroid hormones like dexamethasone, cortisone, hydrocortisone  theophylline  thyroid hormones This list may not describe all possible interactions. Give your health care provider a list of all the medicines, herbs, non-prescription drugs, or dietary supplements you use. Also tell them if you smoke, drink alcohol, or use illegal drugs. Some items may interact with your medicine.  What should I watch for while using this medicine? Tell your doctor or health care professional if your symptoms do not improve. Do not use extra albuterol. If your asthma or bronchitis gets worse while you are using this medicine, call your doctor right away. If your mouth gets dry try chewing sugarless gum or sucking hard candy. Drink water as directed.  What side effects may I notice from receiving this medicine? Side effects that you should report to your doctor or health care professional as soon as possible:  allergic reactions like skin rash, itching or hives, swelling of  the face, lips, or tongue  breathing problems  chest pain  feeling faint or lightheaded, falls  high blood pressure  irregular heartbeat  fever  muscle cramps or weakness  pain, tingling, numbness in the hands or feet  vomiting Side effects that usually do not require medical attention (report to your doctor or health care professional if they continue or are bothersome):  changes in taste  cough  dry mouth  headache  nervousness or trembling  stomach upset  stuffy or runny nose  throat irritation  trouble sleeping This list may not describe all possible side effects. Call your doctor for medical advice about side effects. You may report side effects to FDA at 1-800-FDA-1088.  Where should I keep my medicine? Keep out of the reach of children. Store Proventil HFA and  ProAir HFA at room temperature between 15 and 25 degrees C (59 and 77 degrees F). Store Ventolin HFA at room temperature between 20 and 25 degrees C (68 and 77 degrees F); it may be stored between 15 and 30 degrees C (59 and 86 degrees F) on occasion. The contents are under pressure and may burst when exposed to heat or flame. Do not freeze. This medicine does not work as well if it is too cold. Throw away the inhaler when the dose counter displays "0" or after the expiration date on the package, whichever comes first. Ventolin HFA should be thrown away 12 months after removing it from the foil pouch. NOTE: This sheet is a summary. It may not cover all possible information. If you have questions about this medicine, talk to your doctor, pharmacist, or health care provider.  2020 Elsevier/Gold Standard (2018-11-27 12:46:54)

## 2020-02-08 NOTE — Consult Note (Addendum)
NAME:  Alfred Mercado, MRN:  [id number], DOB:  [birth year], LOS: 1 ADMISSION DATE:  02/06/2020, CONSULTATION DATE: 02/08/2020 REFERRING MD:  Dr. Rogelia Boga, CHIEF COMPLAINT: Dyspnea, pleural effusion  Brief History   84 year old man, smoker, with A. fib, hypertension, HFpEF, recurrent pleural effusion that is been tapped in November 2020 and again in 09/2019, showed transudate.  Admitted with dyspnea, recurrence of right effusion.  Has benefited from bronchodilators.  History of present illness   84 year old smoker (60 pack years) with atrial fibrillation on Eliquis, hypertension with diastolic CHF on furosemide 40 mg twice daily, diltiazem, metoprolol.  He has undergone thoracentesis 11/20, 2/21, found to have transudative R effusion.  He began to experience dyspnea, weakness and lightheadedness beginning 6/11.  He sought care and recurrent effusion was identified.  He notes that he has benefited significantly from bronchodilators since admission.  Also admits to dietary indiscretion, sodium use, etc.  He continues to smoke "6-7 cig a day".   Past Medical History   Past Medical History:  Diagnosis Date   Acute heart failure with preserved ejection fraction (HCC)    Atrial fibrillation with rapid ventricular response (HCC) 07/11/2019    Significant Hospital Events     Consults:    Procedures:    Significant Diagnostic Tests:  CXR 6/12 >> new large right pleural effusion with right basilar volume loss, mild vascular congestion  Micro Data:    Antimicrobials:     Interim history/subjective:  Feels notably better since admission, states that nebulized bronchodilators subsequently change his breathing. I/O- 1.9 L total  Objective   Blood pressure 125/69, pulse 68, temperature 97.8 F (36.6 C), temperature source Oral, resp. rate 16, height 6\' 2"  (1.88 m), weight 87.3 kg, SpO2 95 %.        Intake/Output Summary (Last 24 hours) at 02/08/2020 0825 Last data filed at 02/08/2020  0600 Gross per 24 hour  Intake 1142 ml  Output 2225 ml  Net -1083 ml   Filed Weights   02/06/20 1545 02/07/20 0425 02/08/20 0344  Weight: 90.7 kg 88.9 kg 87.3 kg    Examination: General: Pleasant elderly man, laying in bed on room air in no distress HENT: Oropharynx clear, edentulous Lungs: Decreased breath sounds right base, distant, no wheezing Cardiovascular: Irregularly irregular, distant, no murmur Abdomen: Soft, benign, nondistended, positive bowel sounds Extremities: No significant edema Neuro: Awake, alert, interacting appropriately, decreased hearing.  Answers questions and follows commands  Resolved Hospital Problem list     Assessment & Plan:  Multifactorial acute respiratory failure due to accumulating volume overload in the setting of his chronic HFpEF, associated pleural effusion on the right (presumed transudate).  This superimposed on presumed COPD.  Improved with treatments as described below  A fib, HFpEF, decompensated.  He has improved with increased diuresis -1.9 L.  I discussed with him the potential benefit, quicker respiratory improvement with right thoracentesis.  He would like to hold off, continue diuresis, work hard on his dietary modifications.  He seems to have a better understanding now of the benefits of salt restriction, etc.  If his progress stalls out or if he changes his mind about thoracentesis we can perform.  His Eliquis is on hold.  Should be able to restart this if he is convinced that he does not want thoracentesis.  He will need follow-up chest x-ray(s)  to assess for persistence, resolution effusion  COPD without any apparent exacerbation.  He was largely unaware of this diagnosis, has not been on outpatient bronchodilator therapy.  I think he will benefit from this as his response to nebulizers in the hospital suggests.  Agree with starting bronchodilators now, would recommend LABA/LAMA.  [facility] can manage and make adjustments.  He would also benefit from an albuterol HFA vs nebulizer to use as needed, education on usage.  Ultimately he will need smoking cessation assistance, full PFT, etc.   Best practice:  Diet: Heart healthy, low-salt Pain/Anxiety/Delirium protocol (if indicated): N/A VAP protocol (if indicated): N/A DVT prophylaxis: Eliquis on hold GI prophylaxis: N/A Glucose control: N/A Mobility: Out of bed ad lib. Code Status: Full code   Labs   CBC: Recent Labs  Lab 02/06/20 0850 02/07/20 0919  WBC 7.1 6.2  HGB 13.2 13.0  HCT 39.6 39.2  MCV 91.7 91.4  PLT 267 235    Basic Metabolic Panel: Recent Labs  Lab 02/06/20 0850 02/07/20 0919 02/08/20 0640  NA 140 139 139  K 3.7 3.6 3.3*  CL 102 100 101  CO2 28 29 29   GLUCOSE 129* 175* 114*  BUN 14 13 16   CREATININE 1.18 1.27* 1.22  CALCIUM 8.8* 9.1 8.9  MG  --  1.7 1.9   GFR: Estimated Creatinine Clearance: 54.3 mL/min (by C-G formula based on SCr of 1.22 mg/dL). Recent Labs  Lab 02/06/20 0850 02/07/20 0919  WBC 7.1 6.2    Liver Function Tests: Recent Labs  Lab 02/06/20 1308  AST 19  ALT 18  ALKPHOS 76  BILITOT 1.1  PROT 6.1*  ALBUMIN 3.4*   No results for input(s): LIPASE, AMYLASE in the last 168 hours. No results for input(s): AMMONIA in the last 168 hours.  ABG No results found for: PHART, PCO2ART, PO2ART, HCO3, TCO2, ACIDBASEDEF, O2SAT   Coagulation Profile: No results for input(s): INR, PROTIME in the last 168 hours.  Cardiac Enzymes: No results for input(s): CKTOTAL, CKMB, CKMBINDEX, TROPONINI in the last 168 hours.  HbA1C: Hgb A1c MFr Bld  Date/Time Value Ref Range Status  07/11/2019 02:23 PM 5.8 (H) 4.8 - 5.6 % Final    Comment:    (NOTE) Pre diabetes:          5.7%-6.4% Diabetes:              >6.4% Glycemic control for   <7.0% adults with diabetes     CBG: No results for input(s): GLUCAP in the last 168 hours.  Review of Systems:   As per HPI  Past  Medical History  He,  has a past medical history of Acute heart failure with preserved ejection fraction (HCC) and Atrial fibrillation with rapid ventricular response (HCC) (07/11/2019).   Surgical History    Past Surgical History:  Procedure Laterality Date   APPENDECTOMY       Social History   reports that he has been smoking. He has never used smokeless tobacco. He reports previous alcohol use. He reports that he does not use drugs.   Family History   His family history includes Aneurysm in his father.   Allergies No Known Allergies   Home Medications  Prior to Admission medications   Medication Sig Start Date End Date Taking? Authorizing Provider  acetaminophen (TYLENOL) 325 MG tablet Take 650 mg by mouth every 6 (six) hours as needed for mild pain or headache.   Yes [provider]  apixaban (ELIQUIS) 5 MG TABS tablet Take 5 mg by mouth 2 (two) times daily.   Yes [provider]  diltiazem (CARDIZEM CD) 180 MG 24 hr capsule Take 1 capsule (180 mg total) by mouth daily. 11/11/19  Yes Charlott Rakes, MD  ergocalciferol (DRISDOL) 1.25 MG (50000 UT) capsule Take 1 capsule (50,000 Units total) by mouth once a week. 11/12/19  Yes Charlott Rakes, MD  furosemide (LASIX) 40 MG tablet Take 40 mg by mouth 2 (two) times daily. If your weight goes up by 2-3 pounds in a day or 5 pounds in a week, take one extra tab (40 mg) of lasix    Yes [provider]  metoprolol tartrate (LOPRESSOR) 25 MG tablet Take 1 tablet (25 mg total) by mouth 2 (two) times daily. 11/11/19  Yes Newlin, Charlane Ferretti, MD  nicotine (NICODERM CQ - DOSED IN MG/24 HOURS) 21 mg/24hr patch Place 1 patch (21 mg total) onto the skin daily. 10/19/19  Yes Lucky Cowboy, MD     Critical care time: N/A     Baltazar Apo, MD, PhD 02/08/2020, 8:41 AM [facility] [phone] or if no answer [phone]

## 2020-02-08 NOTE — Progress Notes (Signed)
Subjective:  [name] was evaluated at bedside this morning. He is in good spirits and does not report any acute concerns this morning. States his breathing improved greatly after albuterol neb yesterday. He is appreciative of the care he has been receiving, "I wouldn't trade you all for a $40 mule."  Objective:  Vital signs in last 24 hours: Vitals:   02/07/20 1147 02/07/20 1953 02/08/20 0342 02/08/20 0344  BP: 125/83 129/72 125/69   Pulse: 81 75 68   Resp: 18 18 16    Temp: 97.8 F (36.6 C) 98.4 F (36.9 C) 97.8 F (36.6 C)   TempSrc: Oral Oral Oral   SpO2: 95% 97% 95%   Weight:    87.3 kg  Height:       Physical Exam Vitals and nursing note reviewed.  Constitutional:      General: He is not in acute distress.    Appearance: He is not ill-appearing.  Cardiovascular:     Rate and Rhythm: Normal rate. Rhythm irregular.     Heart sounds: Normal heart sounds.  Pulmonary:     Comments: Normal respiratory effort on room air. Intermittent tachypnea when moving around in bed. Decreased breath sounds at right base. Scattered wheezing throughout. Musculoskeletal:     Comments: Trace bilateral LE edema  Skin:    General: Skin is warm and dry.  Neurological:     Mental Status: He is alert.    Assessment/Plan:  Active Problems:   Acute diastolic CHF (congestive heart failure) (HCC)  [name] an 84 yo M w/ PMH of HFpEF and persistent A.fib presenting to [facility] with complaints of dyspnea 2/2 acute on chronic diastolic heart failure exacerbation with recurrent right-sided pleural effusion.  Acute on chronic diastolic heart failure Right sided pleural effusion Admission CXR with large R pleural effusion and vascular congestion. Pt required multiple prior thoracentesis for transudative effusions attributed to his HFpEF, last one 10/17/2019 with 1.4L removed. Home diuretic dose is 40mg  lasix PO BID. Suspect exacerbation secondary to dietary indiscretion at home (pt  endorses eating frozen meals and friends who cook for him heavily salt the food). - on IV lasix 40mg  BID - net negative 1.9L yesterday, wt down 1.6kg - on room air - pt would like to defer another thoracentesis today, and work on dietary changes and continued diuresis - would be stable for discharge with good outpatient follow-up, pt requires continued HF education - will reach out to HF at home program for potential enrollment  - AM labs - K 3.3, bicarb 29, Cr 1.22, Mg 1.9 - supplement to keep K >4 and Mg >2 - strict I/Os and daily weights - 1.5L fluid restriction   Wheezing - Likely COPD Tobacco use Disorder - current smoker for >50 years - breathing improved with albuterol nebulizer yesterday - no formal diagnosis of COPD though highly likely given smoking history and improvement with bronchodilator - pharm consult for inhaler education   Pulm recommending  - LABA/LAMA - albuterol HFA vs nebs - smoking cessation - outpatient PFTs  Persistent Atrial Fibrillation In persistent A.fib. CHAD-VASC2 score of 4. On metoprolol, diltiazem, and Eliquis at home. Last Eliquis dose 6/12 in AM. - restart Eliquis today given deferred thoracentesis  - continue home metoprolol and diltiazem - tele  Vitamin D deficiency - continue home vitamin D supplementation   DVT prophx: restart home Eliquis Diet:Cardiac w/ fluid restriction Fluids: none Code:reversed back to FULL CODE  Prior to Admission Living Arrangement: home Anticipated Discharge Location:  home Barriers to Discharge: clinical improvement Dispo: Anticipated discharge in approximately 0-1 day(s).   [name], MD 02/08/2020, 6:19 AM Pager: [phone] After 5pm on weekdays and 1pm on weekends: On Call pager [phone]

## 2020-02-08 NOTE — TOC Benefit Eligibility Note (Signed)
Transition of Care [facility]) Benefit Eligibility Note    Patient Details  Name: Alfred Mercado MRN: [id number] Date of Birth: [birth year]   Medication/Dose: Eliquis 5mg .and Anoro inhaler  Covered?: Yes  Tier:  (NO TIER FOR MEDICAD)  Prescription Coverage Preferred Pharmacy: with Person/Company/Phone Number:: [phone] W/Medicaid and Maggie w/Medicaid PH#[phone]  Co-Pay: $3.00  Prior Approval: No  Deductible:  (NO DEDUCTIBLE)       [phone] Phone Number: 02/08/2020, 2:49 PM

## 2020-02-09 ENCOUNTER — Ambulatory Visit: Payer: Medicare Other | Admitting: Family Medicine

## 2020-02-09 DIAGNOSIS — J9601 Acute respiratory failure with hypoxia: Secondary | ICD-10-CM

## 2020-02-09 LAB — BASIC METABOLIC PANEL
Anion gap: 9 (ref 5–15)
BUN: 18 mg/dL (ref 8–23)
CO2: 29 mmol/L (ref 22–32)
Calcium: 8.9 mg/dL (ref 8.9–10.3)
Chloride: 101 mmol/L (ref 98–111)
Creatinine, Ser: 1.22 mg/dL (ref 0.61–1.24)
GFR calc Af Amer: >60 mL/min (ref >60)
GFR calc non Af Amer: 55 mL/min — ABNORMAL LOW (ref >60)
Glucose, Bld: 123 mg/dL — ABNORMAL HIGH (ref 70–99)
Potassium: 3.6 mmol/L (ref 3.5–5.1)
Sodium: 139 mmol/L (ref 135–145)

## 2020-02-09 LAB — MAGNESIUM: Magnesium: 1.9 mg/dL (ref 1.7–2.4)

## 2020-02-09 MED ORDER — FUROSEMIDE 10 MG/ML IJ SOLN
40.0000 mg | Freq: Once | INTRAMUSCULAR | Status: AC
  Administered 2020-02-09: 40 mg via INTRAVENOUS
  Filled 2020-02-09: qty 4

## 2020-02-09 MED ORDER — POTASSIUM CHLORIDE CRYS ER 20 MEQ PO TBCR
40.0000 meq | EXTENDED_RELEASE_TABLET | Freq: Once | ORAL | Status: AC
  Administered 2020-02-09: 40 meq via ORAL
  Filled 2020-02-09: qty 2

## 2020-02-09 MED ORDER — MAGNESIUM SULFATE 2 GM/50ML IV SOLN
2.0000 g | Freq: Once | INTRAVENOUS | Status: AC
  Administered 2020-02-09: 2 g via INTRAVENOUS
  Filled 2020-02-09: qty 50

## 2020-02-09 MED ORDER — UMECLIDINIUM-VILANTEROL 62.5-25 MCG/INH IN AEPB: 1.0000 | INHALATION_SPRAY | Freq: Every day | RESPIRATORY_TRACT | 0 refills | Status: DC

## 2020-02-09 MED ORDER — ALBUTEROL SULFATE HFA 108 (90 BASE) MCG/ACT IN AERS
2.0000 | INHALATION_SPRAY | Freq: Four times a day (QID) | RESPIRATORY_TRACT | 0 refills | Status: DC | PRN
Start: 2020-02-09 — End: 2020-02-25

## 2020-02-09 NOTE — TOC Transition Note (Addendum)
Transition of Care [facility]) - CM/SW Discharge Note   Patient Details  Name: Alfred Mercado MRN: [id number] Date of Birth: 28-Mar-1936  Transition of Care [facility]) CM/SW Contact:  Lawerance Sabal, RN Phone Number: 02/09/2020, 11:48 AM   Clinical Narrative:    Patient provided with resources on how to schedule transportation through his medicaid benefits. Provided with cab voucher for DC.  Medication provided through [facility] pharmacy Coverage- [employer], Medicaid PCP [facility]  Final next level of care: Home/Self Care Barriers to Discharge: No Barriers Identified   Patient Goals and CMS Choice Patient states their goals for this hospitalization and ongoing recovery are:: to go home      Discharge Placement                       Discharge Plan and Services                                     Social Determinants of Health (SDOH) Interventions     Readmission Risk Interventions No flowsheet data found.

## 2020-02-09 NOTE — Progress Notes (Signed)
NAME:  Alfred Mercado, MRN:  [id number], DOB:  November 07, 1935, LOS: 2 ADMISSION DATE:  02/06/2020, CONSULTATION DATE: 02/08/2020 REFERRING MD:  Dr. Lynnae January, CHIEF COMPLAINT: Dyspnea, pleural effusion  Brief History   84 year old man, smoker, with A. fib, hypertension, HFpEF, recurrent pleural effusion that is been tapped in November 2020 and again in 09/2019, showed transudate.  Admitted with dyspnea, recurrence of right effusion.  Has benefited from bronchodilators.  History of present illness   84 year old smoker (60 pack years) with atrial fibrillation on Eliquis, hypertension with diastolic CHF on furosemide 40 mg twice daily, diltiazem, metoprolol.  He has undergone thoracentesis 11/20, 2/21, found to have transudative R effusion.  He began to experience dyspnea, weakness and lightheadedness beginning 6/11.  He sought care and recurrent effusion was identified.  He notes that he has benefited significantly from bronchodilators since admission.  Also admits to dietary indiscretion, sodium use, etc.  He continues to smoke "6-7 cig a day".   Past Medical History   Past Medical History:  Diagnosis Date  . Acute heart failure with preserved ejection fraction ([place])   . Atrial fibrillation with rapid ventricular response ([place]) 07/11/2019    Significant Hospital Events     Consults:  02/08/2020 pulmonary  Procedures:    Significant Diagnostic Tests:  CXR 6/12 >> new large right pleural effusion with right basilar volume loss, mild vascular congestion  Micro Data:    Antimicrobials:     Interim history/subjective:  ,  Intake/Output Summary (Last 24 hours) at 02/09/2020 1218 Last data filed at 02/09/2020 0851 Gross per 24 hour  Intake 1382 ml  Output 2275 ml  Net -893 ml   Continue diuresis  Objective   Blood pressure (!) 128/56, pulse 62, temperature (!) 97.5 F (36.4 C), temperature source Oral, resp. rate 19, height 6\' 2"  (1.88 m), weight 86.9 kg, SpO2 98 %.         Intake/Output Summary (Last 24 hours) at 02/09/2020 1210 Last data filed at 02/09/2020 0851 Gross per 24 hour  Intake 1382 ml  Output 2475 ml  Net -1093 ml   Filed Weights   02/07/20 0425 02/08/20 0344 02/09/20 0400  Weight: 88.9 kg 87.3 kg 86.9 kg    Examination: Well-nourished well-developed male lying in bed no acute distress on room air No JVD or lymphadenopathy is appreciated Chest decreased air movement decreased breath sounds right base Cardiac heart sounds regular regular rate rhythm Abdomen soft nontender Lower extremities without edema   Resolved Hospital Problem list     Assessment & Plan:  Multifactorial acute respiratory failure due to accumulating volume overload in the setting of his chronic HFpEF, associated pleural effusion on the right (presumed transudate).  This superimposed on presumed COPD.  Improved with treatments as described below   Currently on room air no acute distress Improved with diuresis. The potential of thoracentesis and benefits has been discussed with him in the past Ongoing discussions concerning thoracentesis May need follow-up in outpatient evaluation for such. Serial chest x-rays Follow up with Dr. Joya Gaskins as opt.       Best practice:  Diet: Heart healthy, low-salt Pain/Anxiety/Delirium protocol (if indicated): N/A VAP protocol (if indicated): N/A DVT prophylaxis: Eliquis on hold GI prophylaxis: N/A Glucose control: N/A Mobility: Out of bed ad lib. Code Status: Full code   Labs   CBC: Recent Labs  Lab 02/06/20 0850 02/07/20 0919  WBC 7.1 6.2  HGB 13.2 13.0  HCT 39.6 39.2  MCV 91.7 91.4  PLT 267 235  Basic Metabolic Panel: Recent Labs  Lab 02/06/20 0850 02/07/20 0919 02/08/20 0640 02/09/20 0709  NA 140 139 139 139  K 3.7 3.6 3.3* 3.6  CL 102 100 101 101  CO2 28 29 29 29   GLUCOSE 129* 175* 114* 123*  BUN 14 13 16 18   CREATININE 1.18 1.27* 1.22 1.22  CALCIUM 8.8* 9.1 8.9 8.9  MG  --  1.7 1.9 1.9    GFR: Estimated Creatinine Clearance: 54.3 mL/min (by C-G formula based on SCr of 1.22 mg/dL). Recent Labs  Lab 02/06/20 0850 02/07/20 0919  WBC 7.1 6.2    Liver Function Tests: Recent Labs  Lab 02/06/20 1308  AST 19  ALT 18  ALKPHOS 76  BILITOT 1.1  PROT 6.1*  ALBUMIN 3.4*   No results for input(s): LIPASE, AMYLASE in the last 168 hours. No results for input(s): AMMONIA in the last 168 hours.  ABG No results found for: PHART, PCO2ART, PO2ART, HCO3, TCO2, ACIDBASEDEF, O2SAT   Coagulation Profile: No results for input(s): INR, PROTIME in the last 168 hours.  Cardiac Enzymes: No results for input(s): CKTOTAL, CKMB, CKMBINDEX, TROPONINI in the last 168 hours.  HbA1C: Hgb A1c MFr Bld  Date/Time Value Ref Range Status  07/11/2019 02:23 PM 5.8 (H) 4.8 - 5.6 % Final    Comment:    (NOTE) Pre diabetes:          5.7%-6.4% Diabetes:              >6.4% Glycemic control for   <7.0% adults with diabetes     CBG: No results for input(s): GLUCAP in the last 168 hours.  04/07/20 Glorian Mcdonell ACNP Acute Care Nurse Practitioner 07/13/2019 Pulmonary/Critical Care Please consult Amion 02/09/2020, 12:10 PM

## 2020-02-09 NOTE — Discharge Summary (Signed)
Name: Alfred Mercado MRN: [id number] DOB: May 16, 1936 84 y.o. PCP: Hoy Register, MD  Date of Admission: 02/06/2020  8:41 AM Date of Discharge: 02/09/2020 Attending Physician: Inez Catalina, MD  Discharge Diagnosis: Active Problems:   Pleural effusion   Exertional shortness of breath   Acute diastolic CHF (congestive heart failure) [facility])  Discharge Medications: Allergies as of 02/09/2020   No Known Allergies     Medication List    TAKE these medications   acetaminophen 325 MG tablet Commonly known as: TYLENOL Take 650 mg by mouth every 6 (six) hours as needed for mild pain or headache.   albuterol 108 (90 Base) MCG/ACT inhaler Commonly known as: VENTOLIN HFA Inhale 2 puffs into the lungs every 6 (six) hours as needed for wheezing or shortness of breath.   diltiazem 180 MG 24 hr capsule Commonly known as: Cardizem CD Take 1 capsule (180 mg total) by mouth daily.   Eliquis 5 MG Tabs tablet Generic drug: apixaban Take 5 mg by mouth 2 (two) times daily.   ergocalciferol 1.25 MG (50000 UT) capsule Commonly known as: Drisdol Take 1 capsule (50,000 Units total) by mouth once a week.   furosemide 40 MG tablet Commonly known as: LASIX Take 40 mg by mouth 2 (two) times daily. If your weight goes up by 2-3 pounds in a day or 5 pounds in a week, take one extra tab (40 mg) of lasix   metoprolol tartrate 25 MG tablet Commonly known as: LOPRESSOR Take 1 tablet (25 mg total) by mouth 2 (two) times daily.   nicotine 21 mg/24hr patch Commonly known as: NICODERM CQ - dosed in mg/24 hours Place 1 patch (21 mg total) onto the skin daily.   umeclidinium-vilanterol 62.5-25 MCG/INH Aepb Commonly known as: ANORO ELLIPTA Inhale 1 puff into the lungs daily. Start taking on: February 10, 2020       Disposition and follow-up:   [name] was [facility] from [facility] in Good condition.  At the hospital follow up visit please address:  1.  Acute HFpEF  exacerbation Symptomatic right pleural effusion - recurrent R pleural effusion on admission CXR - assumed to be transudative from heart failure given pt's dietary indiscretions - deferred thoracentesis due to pt preference - diuresed inpatient and discharged on home dose diuretic - ensure pt taking medications and weighing himself at home - may benefit from referral to heart failure clinic and enrollment in chronic care management - if symptoms recur, pt will need outpatient thoracentesis   Likely COPD (pt unaware of this diagnosis) Tobacco Use Disorder - wheezing on ausculation on hospital day 1 - symptoms improved with bronchodilators - initiated LAMA/LABA and PRN albuterol while inpatient with pharmacy education - instructed pt to continue outpatient - will need formal PFT eval - encourage smoking cessation  Of note, would recommend pt has continued education regarding understanding and management of chronic medical conditions, specifically heart failure diet and COPD inhalers.  2.  Labs / imaging needed at time of follow-up: repeat CXR in 1 month to assess improvement of effusion  3.  Pending labs/ test needing follow-up: none  Follow-up Appointments:  Follow-up Information    Hoy Register, MD. Go on 02/25/2020.   Specialty: Family Medicine Why: Go to your appointment on 02/25/2020 at 11:00AM Contact information: [address] [place] [address] [phone]               Hospital Course by problem list: 1. [name] is an  84 year old community dwelling gentleman with diastolic heart failure and permanent atrial fibrillation on Eliquis. He has a history of right transudative of pleural effusion due to heart failure that has required thoracentesis x2, most recently in February 2021. He presented yesterday after the onset of dyspnea the night prior which progressed.  In the ED, he had a chest x-ray that showed a new large right pleural effusion.  Pt remained on  room air. Pulmonology planned a thoracentesis for 6/14 after last dose of Eliquis was the morning of 6/12. On hospital day 1, pt did have ongoing dyspnea off and on throughout the night. On ausculation, lungs had diffuse wheezing throughout that was not present on admission. Pt with significant smoking history > 60 years. Symptoms improved with trial of duonebs in addition to IV lasix. On 6/14, pt deferred thoracentesis due to prior pain with this procedure. He preferred continued diuresis and work on dietary modifications. Education provided regarding low Na diet. Pulmonology also recommended for initiation of LABA/LAMA and PRN albuterol for COPD without acute exacerbation. Inpatient pharmacy team instructed pt on appropriate inhaler use. On day of discharge, pt was net negative 3L with stable kidney function and electrolytes. Restarted on home dose diuretic at discharge with instructions for a fluid restricted and low Na diet. Has follow-up scheduled with his PCP in 2 weeks. If his dyspnea worsens or recurs and overall clinical improvement stalls, pt should be evaluated for outpatient thoracentesis for removal of pleural effusion and quicker respiratory improvement.   Discharge Vitals:   BP (!) 128/56 (BP Location: Left Arm)   Pulse 62   Temp (!) 97.5 F (36.4 C) (Oral)   Resp 19   Ht 6\' 2"  (1.88 m)   Wt 86.9 kg   SpO2 98%   BMI 24.60 kg/m   Pertinent Labs, Studies, and Procedures:  CBC Latest Ref Rng & Units 02/07/2020 02/06/2020 01/07/2020  WBC 4.0 - 10.5 K/uL 6.2 7.1 8.8  Hemoglobin 13.0 - 17.0 g/dL 13.0 13.2 13.4  Hematocrit 39 - 52 % 39.2 39.6 37.6  Platelets 150 - 400 K/uL 235 267 284   BMP Latest Ref Rng & Units 02/09/2020 02/08/2020 02/07/2020  Glucose 70 - 99 mg/dL 123(H) 114(H) 175(H)  BUN 8 - 23 mg/dL 18 16 13   Creatinine 0.61 - 1.24 mg/dL 1.22 1.22 1.27(H)  BUN/Creat Ratio 10 - 24 - - -  Sodium 135 - 145 mmol/L 139 139 139  Potassium 3.5 - 5.1 mmol/L 3.6 3.3(L) 3.6  Chloride 98 -  111 mmol/L 101 101 100  CO2 22 - 32 mmol/L 29 29 29   Calcium 8.9 - 10.3 mg/dL 8.9 8.9 9.1   DG Chest 2 View  Result Date: 02/06/2020 CLINICAL DATA:  Shortness of breath over the last 2 days. EXAM: CHEST - 2 VIEW COMPARISON:  10/17/2019 FINDINGS: Lungs are adequately inflated demonstrate a new large right pleural effusion occupying approximately 1/2 of the right thorax likely with associated basilar atelectasis. Subtle prominence of the perihilar markings which may be due to a degree of vascular congestion. Cardiomediastinal silhouette and remainder of the exam is unchanged. IMPRESSION: 1. New large right pleural effusion occupying half the right thorax likely with associated basilar atelectasis. 2.  Suggestion of mild vascular congestion. Electronically Signed   By: Marin Olp M.D.   On: 02/06/2020 10:32   Discharge Instructions: Discharge Instructions    (HEART FAILURE PATIENTS) Call MD:  Anytime you have any of the following symptoms: 1) 3 pound weight gain in 24  hours or 5 pounds in 1 week 2) shortness of breath, with or without a dry hacking cough 3) swelling in the hands, feet or stomach 4) if you have to sleep on extra pillows at night in order to breathe.   Complete by: As directed    Call MD for:  difficulty breathing, headache or visual disturbances   Complete by: As directed    Call MD for:  extreme fatigue   Complete by: As directed    Call MD for:  hives   Complete by: As directed    Call MD for:  persistant dizziness or light-headedness   Complete by: As directed    Call MD for:  persistant nausea and vomiting   Complete by: As directed    Call MD for:  redness, tenderness, or signs of infection (pain, swelling, redness, odor or green/yellow discharge around incision site)   Complete by: As directed    Call MD for:  severe uncontrolled pain   Complete by: As directed    Call MD for:  temperature >100.4   Complete by: As directed    Diet - low sodium heart healthy    Complete by: As directed    Discharge instructions   Complete by: As directed    [name] were admitted to the hospital after having trouble breathing at home. This was due to fluid on your right lung (pleural effusion) and damage to your lungs from smoking (COPD).  For your heart failure, please adhere to a low salt diet in order to prevent additional fluid rentention. Weight yourself each day. If you gain more than 2-3lbs in 1 day or 5lbs in a week, then please take an extra pill of lasix and call your primary doctor.  It is likely that you have COPD (or damage to the lungs) from years of smoking. Please being taking a daily inhaler (Anoro) to help your breathing. You may also take an albuterol inhaler as needed (up to four times a day) for any additional wheezing or shortness of breath. It is very import you stop smoking to prevent further lung damage.   Continue to take all your other medications as prescribed.  Go to your scheduled appointment with your primary provider on July 1st for continued follow-up on your ongoing health conditions.  Thank you for letting us be a part of your care!   Heart Failure patients record your daily weight using the same scale at the same time of day   Complete by: As directed    Increase activity slowly   Complete by: As directed    STOP any activity that causes chest pain, shortness of breath, dizziness, sweating, or exessive weakness   Complete by: As directed       Signed: Thom Chimes, MD 02/09/2020, 11:07 AM   Pager: [phone]

## 2020-02-09 NOTE — Progress Notes (Signed)
Subjective: Pt seen at the bedside this AM. Feeling well. Able to use Anoro inhaler this morning with instructions from nursing staff. Pt able to demonstrate instruction steps for the medical team at the bedside. Discussed discharge today with continued outpatient follow-up to monitor his HF and like COPD. Pt understanding and appreciative of his care during this admission.  Objective:  Vital signs in last 24 hours: Vitals:   02/08/20 2012 02/08/20 2013 02/08/20 2049 02/09/20 0400  BP: (!) 109/55  (!) 122/54 (!) 128/56  Pulse: (!) 40 79  62  Resp: 19   19  Temp: 97.9 F (36.6 C)   (!) 97.5 F (36.4 C)  TempSrc: Oral   Oral  SpO2: 94% 95%  98%  Weight:    86.9 kg  Height:       Physical Exam Vitals and nursing note reviewed.  Constitutional:      General: He is not in acute distress.    Comments: Elderly male sitting up the the edge of the bed.  Cardiovascular:     Rate and Rhythm: Normal rate. Rhythm irregular.     Heart sounds: Normal heart sounds.  Pulmonary:     Comments: Normal respiratory effort on room air. Decreased breath sounds in right base. Otherwise clear. Good air movement. No wheezing or crackles. Musculoskeletal:     Right lower leg: No edema.     Left lower leg: No edema.  Skin:    General: Skin is warm and dry.  Neurological:     Mental Status: He is alert.    Assessment/Plan:  Active Problems:   Pleural effusion   Exertional shortness of breath   Acute diastolic CHF (congestive heart failure) (HCC)  AlfredJamersonis an 84 yo M w/ PMH of HFpEF and persistent A.fib presenting to [facility] with complaints of dyspnea 2/2 acute on chronic diastolic heart failure exacerbation withrecurrent right-sidedpleural effusion.  Acute on chronic diastolic heart failure Right sided pleural effusion Admission CXR with large R pleural effusion and vascular congestion. Pt required multiple prior thoracentesis for transudative effusions attributed to his HFpEF, last one  10/17/2019 with 1.4L removed. Suspect exacerbation secondary to dietary indiscretion at home (pt endorses eating frozen meals and friends who cook for him heavily salt the food). - net -998cc and wt -0.4kg yesterday after 40mg  IV lasix x2 - pt breathing improved this morning, persistent decreased breath sounds at the R base - unable to enroll in HF at home program due to living situation - has outpatient PCP f/u on 7/1 - will need continued monitoring for possible outpatient thora if pt's symptoms return/worsen - HF and low Na diet education added to pt's AVS - today K 3.6, Mg 1.9, bicarb 29, and Cr 1.22 - supplement to keep K >4 and Mg >2 - strict I/Os and daily weights - 1.5L fluid restriction   Wheezing - Likely COPD Tobacco use Disorder - no wheezing on ascultation today, on room air - no formal diagnosis of COPD though highly likely given smoking history and improvement with bronchodilator - pharm student provided extensive inhaler education with pt yesterday afternoon - per TOC, pt's co-pay for Eliquis and Anoro is $3 - d/c prescriptions for Anoro and albuterol sent into [facility] pharmacy   Pulm recommending  - LABA/LAMA - albuterol HFA vs nebs - smoking cessation - outpatient PFTs  Persistent Atrial Fibrillation In persistent A.fib. CHAD-VASC2 score of 4.   - continue home metoprolol, diltiazem, and eliquis  - tele  Vitamin D deficiency -  continue home vitamin D supplementation  DVT prophx: Eliquis Diet:Cardiac w/ fluid restriction Fluids: none Code:FULL CODE  Prior to Admission Living Arrangement: home Anticipated Discharge Location: home Barriers to Discharge: none Dispo: Anticipated discharge in approximately 0 day(s).   Thom Chimes, MD 02/09/2020, 7:22 AM Pager: [phone] After 5pm on weekdays and 1pm on weekends: On Call pager [phone]

## 2020-02-10 ENCOUNTER — Telehealth: Payer: Self-pay

## 2020-02-10 NOTE — Telephone Encounter (Signed)
Transition Care Management Follow-up Telephone Call  Date of discharge and from where: 02/09/2020, [facility]  How have you been since you were released from the hospital?  He stated he is feeling a lot better   Any questions or concerns?  none at this time  Items Reviewed:  Did the pt receive and understand the discharge instructions provided?  yes  Medications obtained and verified? yes, he said he has all of his medications as he just picked some up today at his pharmacy.  He stated that he knows what he is supposed to take  Any new allergies since your discharge?   None reported   Do you have support at home?  friends and staff from the [facility]   Other (ie: DME, Home Health, etc)has not heard from anyone yet.  Functional Questionnaire: (I = Independent and D = Dependent) ADL's: independent   Follow up appointments reviewed:    PCP Hospital f/u appt confirmed? .scheduled appointment with Dr Alvis Lemmings  - 02/25/2020  Specialist appointments? has cardiology appointment 03/07/2020  Are transportation arrangements needed?  no , his friend drives and working to schedule transportation through his medicaid benefits.  If their condition worsens, is the pt aware to call  their PCP or go to the ED?  yes  Was the patient provided with contact information for the PCP's office or ED?  he has the phone number for the clinic  Was the pt encouraged to call back with questions or concerns?  yes

## 2020-02-22 ENCOUNTER — Other Ambulatory Visit: Payer: Self-pay | Admitting: Internal Medicine

## 2020-02-22 ENCOUNTER — Other Ambulatory Visit: Payer: Self-pay | Admitting: Family Medicine

## 2020-02-22 DIAGNOSIS — I4891 Unspecified atrial fibrillation: Secondary | ICD-10-CM

## 2020-02-25 ENCOUNTER — Other Ambulatory Visit: Payer: Self-pay

## 2020-02-25 ENCOUNTER — Ambulatory Visit: Payer: Medicare Other | Attending: Family Medicine | Admitting: Family Medicine

## 2020-02-25 ENCOUNTER — Encounter: Payer: Self-pay | Admitting: Family Medicine

## 2020-02-25 VITALS — BP 111/63 | HR 83 | Ht 74.0 in | Wt 199.0 lb

## 2020-02-25 DIAGNOSIS — Z716 Tobacco abuse counseling: Secondary | ICD-10-CM | POA: Insufficient documentation

## 2020-02-25 DIAGNOSIS — E559 Vitamin D deficiency, unspecified: Secondary | ICD-10-CM | POA: Insufficient documentation

## 2020-02-25 DIAGNOSIS — R7303 Prediabetes: Secondary | ICD-10-CM | POA: Diagnosis not present

## 2020-02-25 DIAGNOSIS — R0600 Dyspnea, unspecified: Secondary | ICD-10-CM | POA: Insufficient documentation

## 2020-02-25 DIAGNOSIS — I482 Chronic atrial fibrillation, unspecified: Secondary | ICD-10-CM | POA: Diagnosis not present

## 2020-02-25 DIAGNOSIS — J90 Pleural effusion, not elsewhere classified: Secondary | ICD-10-CM | POA: Insufficient documentation

## 2020-02-25 DIAGNOSIS — Z7901 Long term (current) use of anticoagulants: Secondary | ICD-10-CM | POA: Diagnosis not present

## 2020-02-25 DIAGNOSIS — F17210 Nicotine dependence, cigarettes, uncomplicated: Secondary | ICD-10-CM | POA: Insufficient documentation

## 2020-02-25 DIAGNOSIS — I4891 Unspecified atrial fibrillation: Secondary | ICD-10-CM

## 2020-02-25 DIAGNOSIS — I5032 Chronic diastolic (congestive) heart failure: Secondary | ICD-10-CM | POA: Diagnosis not present

## 2020-02-25 DIAGNOSIS — Z79899 Other long term (current) drug therapy: Secondary | ICD-10-CM | POA: Diagnosis not present

## 2020-02-25 DIAGNOSIS — R0609 Other forms of dyspnea: Secondary | ICD-10-CM

## 2020-02-25 DIAGNOSIS — Z720 Tobacco use: Secondary | ICD-10-CM

## 2020-02-25 LAB — POCT GLYCOSYLATED HEMOGLOBIN (HGB A1C): HbA1c, POC (controlled diabetic range): 5.7 % (ref 0.0–7.0)

## 2020-02-25 MED ORDER — ALBUTEROL SULFATE HFA 108 (90 BASE) MCG/ACT IN AERS: 2.0000 | INHALATION_SPRAY | Freq: Four times a day (QID) | RESPIRATORY_TRACT | 6 refills | Status: DC | PRN

## 2020-02-25 MED ORDER — ERGOCALCIFEROL 1.25 MG (50000 UT) PO CAPS: 50000.0000 [IU] | ORAL_CAPSULE | ORAL | 0 refills | Status: DC

## 2020-02-25 MED ORDER — UMECLIDINIUM-VILANTEROL 62.5-25 MCG/INH IN AEPB: 1.0000 | INHALATION_SPRAY | Freq: Every day | RESPIRATORY_TRACT | 6 refills | Status: DC

## 2020-02-25 NOTE — Progress Notes (Signed)
Cardiology Office Note   Date:  03/07/2020   ID:  Alfred Mercado, DOB 02-05-1936, MRN [id number]  PCP:  Hoy Register, MD  Cardiologist: Dr. Elease Hashimoto, MD   Chief Complaint  Patient presents with  . Follow-up    History of Present Illness: Alfred Mercado is a 84 y.o. male who presents for follow up, seen for Dr. Elease Hashimoto.   He has a hx of persistent atrial fibrillation on AC with coumadin, chronic diastolic HF and tobacco use. Echocardiogram 06/2019 revealed normal LVEF at 50-55% with indeterminate diastolic function.    He was last seen by Dr. Elease Hashimoto 01/07/20 in follow up and was doing well.   Unfortunately he was then hospitalized 6/12-6/15/21 with large right pleural effusion and AF with RVR s/p thoracentesis. COPD felt to be a large factor for his symptoms as well. He was transitioned to Eliquis for [facility] and Dilitazem 180mg  PO QD, Lasix 40mg  PO BID and metoprolol 25 BID   He was seen by his PCP 02/25/20 for hospital follow feeling better and yet the with a lot of sodium in the EF in the hospital and fluid in the of the and of ARN blood up.   He is feeling well discharge from his recent hospitalization above.  His only came in today is his new RX for Ellipta. Will send message to PCP to see what can be done to assist with this or if there is an alternative they would prefer. He is tolerating all other medications well. Discussed low salt diet in depth as he lives alone and cooks mainly with a microwave. Denies SOB, LE edema, orthopnea, PND, dizziness or syncope. Has no complaints of blood in stool or urine.   Past Medical History:  Diagnosis Date  . Acute heart failure with preserved ejection fraction (HCC)   . Atrial fibrillation with rapid ventricular response (HCC) 07/11/2019    Past Surgical History:  Procedure Laterality Date  . APPENDECTOMY       Current Outpatient Medications  Medication Sig Dispense Refill  . acetaminophen (TYLENOL) 325 MG tablet Take 650 mg by  mouth every 6 (six) hours as needed for mild pain or headache.    . albuterol (VENTOLIN HFA) 108 (90 Base) MCG/ACT inhaler Inhale 2 puffs into the lungs every 6 (six) hours as needed for wheezing or shortness of breath. 18 g 6  . diltiazem (CARDIZEM CD) 180 MG 24 hr capsule TAKE 1 CAPSULE(180 MG) BY MOUTH DAILY 30 capsule 2  . ELIQUIS 5 MG TABS tablet TAKE 1 TABLET TWICE DAILY 60 tablet 1  . ergocalciferol (DRISDOL) 1.25 MG (50000 UT) capsule Take 1 capsule (50,000 Units total) by mouth once a week. 12 capsule 0  . furosemide (LASIX) 40 MG tablet Take 40 mg by mouth 2 (two) times daily. If your weight goes up by 2-3 pounds in a day or 5 pounds in a week, take one extra tab (40 mg) of lasix     . metoprolol tartrate (LOPRESSOR) 25 MG tablet TAKE 1 TABLET(25 MG) BY MOUTH TWICE DAILY 60 tablet 2  . nicotine (NICODERM CQ - DOSED IN MG/24 HOURS) 21 mg/24hr patch Place 1 patch (21 mg total) onto the skin daily. 28 patch 0  . polyethylene glycol powder (GLYCOLAX/MIRALAX) 17 GM/SCOOP powder Take 17 g by mouth daily. 3350 g 1   No current facility-administered medications for this visit.    Allergies:   Patient has no known allergies.    Social History:  The  patient  reports that he has been smoking. He has never used smokeless tobacco. He reports previous alcohol use. He reports that he does not use drugs.   Family History:  The patient's family history includes Aneurysm in his father.    ROS:  Please see the history of present illness.   Otherwise, review of systems are positive for none. All other systems are reviewed and negative.    PHYSICAL EXAM: VS:  Pulse 84   Ht 6' (1.829 m)   Wt 200 lb (90.7 kg)   SpO2 92%   BMI 27.12 kg/m  , BMI Body mass index is 27.12 kg/m.   General: Well developed, well nourished, NAD Neck: Negative for carotid bruits. No JVD Lungs: Diminished in bilateral bases.  No wheezes, rales, or rhonchi. Breathing is unlabored. Cardiovascular: RRR with S1 S2. No  murmurs Extremities: No edema. Radial pulses 2+ bilaterally Neuro: Alert and oriented. No focal deficits. No facial asymmetry. MAE spontaneously. Psych: Responds to questions appropriately with normal affect.     EKG:  EKG is not ordered today.  Recent Labs: 07/11/2019: TSH 0.747 10/14/2019: B Natriuretic Peptide 320.5 02/06/2020: ALT 18 02/07/2020: Hemoglobin 13.0; Platelets 235 02/09/2020: BUN 18; Creatinine, Ser 1.22; Magnesium 1.9; Potassium 3.6; Sodium 139    Lipid Panel    Component Value Date/Time   CHOL 147 07/13/2019 0457   TRIG 77 07/13/2019 0457   HDL 38 (L) 07/13/2019 0457   CHOLHDL 3.9 07/13/2019 0457   VLDL 15 07/13/2019 0457   LDLCALC 94 07/13/2019 0457      Wt Readings from Last 3 Encounters:  03/07/20 200 lb (90.7 kg)  02/25/20 199 lb (90.3 kg)  02/09/20 191 lb 9.6 oz (86.9 kg)    Other studies Reviewed: Additional studies/ records that were reviewed today include:  Review of the above records demonstrates:  Echocardiogram 07/11/2019:  1. Left ventricular ejection fraction, by visual estimation, is 50 to  55%. The left ventricle has low normal function. There is moderately  increased left ventricular hypertrophy.  2. Left ventricular diastolic function could not be evaluated.  3. Global right ventricle has normal systolic function.The right  ventricular size is normal. No increase in right ventricular wall  thickness.  4. Left atrial size was mildly dilated.  5. Right atrial size was normal.  6. The mitral valve is grossly normal. Trace mitral valve regurgitation.  7. The tricuspid valve is grossly normal. Tricuspid valve regurgitation  is trivial.  8. The aortic valve is tricuspid. Aortic valve regurgitation is not  visualized. Mild aortic valve sclerosis without stenosis.  9. The pulmonic valve was not well visualized. Pulmonic valve  regurgitation is not visualized.  10. The aortic root was not well visualized.  11. Mildly elevated  pulmonary artery systolic pressure.  12. The interatrial septum was not well visualized.   ASSESSMENT AND PLAN:  1. Persistent AF: -Recent hospitalization found to be in atrial fibrillation with RVR with medication changes which include diltiazem CD 180 mg, Eliquis 5 mg, Lopressor 25  -Tolerating medication changes well with no evidence of bleeding in the -Maintaining NSR on exam today -No change in medications  2. Chronic diastolic CHF: -Echocardiogram during hospital course which showed stable LVEF -He was volume overload during hospital course with large pleural effusion which was treated with IV Lasix and thoracentesis -Doing well on Lasix 40 mg p.o. twice daily -Continue current regimen  -Appears euvolemic on exam   3. COPD: -Follows with PCP>> last seen 02/25/2020 -Continue inhalers  although having difficulty affording new Anoro Ellipta therefore PCP a message to see if there is an alternative which would be more affordable for him -Decrease tobacco use to 1-2 -Pulmonary referral?   Current medicines are reviewed at length with the patient today.  The patient does not have concerns regarding medicines.  The following changes have been made:  no change  Labs/ tests ordered today include: None  No orders of the defined types were placed in this encounter.   Disposition:   FU with Dr. Katrinka Blazing or APP in 2 months  Signed, Georgie Chard, NP  03/07/2020 8:44 AM    [facility] [address] [place], [place], [place]  [address] Phone: [phone]; Fax: [phone]

## 2020-02-25 NOTE — Progress Notes (Signed)
Subjective:  Patient ID: Alfred Mercado, male    DOB: [birth year]  Age: 84 y.o. MRN: [id number]  CC: Congestive Heart Failure   HPI Alfred Mercado is an 84 year old male with a history of atrial fibrillation with RVR, heart failure with preserved ejection fraction (EF 50 to 55%), previous history of pleural effusion status post thoracocentesis x2, recent hospitalization for pleural effusion at [facility] from 02/06/2020 through 02/09/2020. He had presented with dyspnea and had endorsed discretionary eating. Chest x-ray revealed new large right pleural effusion occupying half of the right thorax, mild vascular congestion. He had declined thoracocentesis and his medications were optimized.  For presumptive diagnosis of COPD he was commenced on inhalers- LAMA/LABA with a prn SABA with associated improvement in respiratory symptoms.  His breathing is better and he has no chest pains, palpitations. Endorses using his inhalers as prescribed. He states he would not like to have any future thoracocentesis procedures. Compliant with his Eliquis.  He sees cardiology for follow-up in 2 weeks. Continues to smoke and has smoked for about 60 years, not ready to quit. Past Medical History:  Diagnosis Date  . Acute heart failure with preserved ejection fraction (HCC)   . Atrial fibrillation with rapid ventricular response (HCC) 07/11/2019    Past Surgical History:  Procedure Laterality Date  . APPENDECTOMY      Family History  Problem Relation Age of Onset  . Aneurysm Father     No Known Allergies  Outpatient Medications Prior to Visit  Medication Sig Dispense Refill  . acetaminophen (TYLENOL) 325 MG tablet Take 650 mg by mouth every 6 (six) hours as needed for mild pain or headache.    . albuterol (VENTOLIN HFA) 108 (90 Base) MCG/ACT inhaler Inhale 2 puffs into the lungs every 6 (six) hours as needed for wheezing or shortness of breath. 18 g 0  . diltiazem (CARDIZEM CD) 180 MG 24 hr  capsule TAKE 1 CAPSULE(180 MG) BY MOUTH DAILY 30 capsule 2  . ELIQUIS 5 MG TABS tablet TAKE 1 TABLET TWICE DAILY 60 tablet 1  . ergocalciferol (DRISDOL) 1.25 MG (50000 UT) capsule Take 1 capsule (50,000 Units total) by mouth once a week. 12 capsule 0  . furosemide (LASIX) 40 MG tablet Take 40 mg by mouth 2 (two) times daily. If your weight goes up by 2-3 pounds in a day or 5 pounds in a week, take one extra tab (40 mg) of lasix     . metoprolol tartrate (LOPRESSOR) 25 MG tablet TAKE 1 TABLET(25 MG) BY MOUTH TWICE DAILY 60 tablet 2  . nicotine (NICODERM CQ - DOSED IN MG/24 HOURS) 21 mg/24hr patch Place 1 patch (21 mg total) onto the skin daily. 28 patch 0  . umeclidinium-vilanterol (ANORO ELLIPTA) 62.5-25 MCG/INH AEPB Inhale 1 puff into the lungs daily. 60 each 0   No facility-administered medications prior to visit.     ROS Review of Systems  Constitutional: Negative for activity change and appetite change.  HENT: Negative for sinus pressure and sore throat.   Eyes: Negative for visual disturbance.  Respiratory: Negative for cough, chest tightness and shortness of breath.   Cardiovascular: Negative for chest pain and leg swelling.  Gastrointestinal: Negative for abdominal distention, abdominal pain, constipation and diarrhea.  Endocrine: Negative.   Genitourinary: Negative for dysuria.  Musculoskeletal: Negative for joint swelling and myalgias.  Skin: Negative for rash.  Allergic/Immunologic: Negative.   Neurological: Negative for weakness, light-headedness and numbness.  Psychiatric/Behavioral: Negative for dysphoric mood and suicidal  ideas.    Objective:  BP 111/63   Pulse 83   Ht 6\' 2"  (1.88 m)   Wt 199 lb (90.3 kg)   SpO2 98%   BMI 25.55 kg/m   BP/Weight 02/25/2020 02/09/2020 01/07/2020  Systolic BP 111 128 128  Diastolic BP 63 56 52  Wt. (Lbs) 199 191.6 197.5  BMI 25.55 24.6 25.36      Physical Exam Constitutional:      Appearance: He is well-developed.  Neck:      Vascular: No JVD.  Cardiovascular:     Rate and Rhythm: Normal rate. Rhythm irregular.     Heart sounds: Normal heart sounds. No murmur heard.   Pulmonary:     Effort: Pulmonary effort is normal.     Breath sounds: No wheezing or rales.     Comments: Slightly reduced lung sounds in right lung base Chest:     Chest wall: No tenderness.  Abdominal:     General: Bowel sounds are normal. There is no distension.     Palpations: Abdomen is soft. There is no mass.     Tenderness: There is no abdominal tenderness.  Musculoskeletal:        General: Normal range of motion.     Right lower leg: No edema.     Left lower leg: No edema.  Skin:    Findings: Bruising present.  Neurological:     Mental Status: He is alert and oriented to person, place, and time.  Psychiatric:        Mood and Affect: Mood normal.     CMP Latest Ref Rng & Units 02/09/2020 02/08/2020 02/07/2020  Glucose 70 - 99 mg/dL 02/09/2020) 505(L) 976(B)  BUN 8 - 23 mg/dL 18 16 13   Creatinine 0.61 - 1.24 mg/dL 341(P 3.79)  Sodium 135 - 145 mmol/L 139 139 139  Potassium 3.5 - 5.1 mmol/L 3.6 3.3(L) 3.6  Chloride 98 - 111 mmol/L 101 101 100  CO2 22 - 32 mmol/L 29 29 29   Calcium 8.9 - 10.3 mg/dL 8.9 8.9 9.1  Total Protein 6.5 - 8.1 g/dL - - -  Total Bilirubin 0.3 - 1.2 mg/dL - - -  Alkaline Phos 38 - 126 U/L - - -  AST 15 - 41 U/L - - -  ALT 0 - 44 U/L - - -    Lipid Panel     Component Value Date/Time   CHOL 147 07/13/2019 0457   TRIG 77 07/13/2019 0457   HDL 38 (L) 07/13/2019 0457   CHOLHDL 3.9 07/13/2019 0457   VLDL 15 07/13/2019 0457   LDLCALC 94 07/13/2019 0457    CBC    Component Value Date/Time   WBC 6.2 02/07/2020 0919   RBC 4.29 02/07/2020 0919   HGB 13.0 02/07/2020 0919   HGB 13.4 01/07/2020 1014   HCT 39.2 02/07/2020 0919   HCT 37.6 01/07/2020 1014   PLT 235 02/07/2020 0919   PLT 284 01/07/2020 1014   MCV 91.4 02/07/2020 0919   MCV 87 01/07/2020 1014   MCH 30.3 02/07/2020 0919   MCHC 33.2  02/07/2020 0919   RDW 12.7 02/07/2020 0919   RDW 12.1 01/07/2020 1014   LYMPHSABS 1.3 01/07/2020 1014   MONOABS 0.5 10/14/2019 1330   EOSABS 0.1 01/07/2020 1014   BASOSABS 0.1 01/07/2020 1014    Lab Results  Component Value Date   HGBA1C 5.8 (H) 07/11/2019    Assessment & Plan:  1. Atrial fibrillation with RVR (HCC) Chronic A.  fib Rate control with Cardizem Anticoagulation with Eliquis  2. Chronic heart failure with preserved ejection fraction (HCC) EF 50 to 55% from echo of 06/2019 He is euvolemic  Adhere to low-sodium, cardiac diet, fluid restriction  3. Pleural effusion, right Transudative likely secondary to CHF Declined thoracocentesis during hospitalization Asymptomatic  4. Vitamin D deficiency - ergocalciferol (DRISDOL) 1.25 MG (50000 UT) capsule; Take 1 capsule (50,000 Units total) by mouth once a week.  Dispense: 12 capsule; Refill: 0  5. Prediabetes A1c is 5.8 - POCT glycosylated hemoglobin (Hb A1C)  6. Other form of dyspnea Presumptive COPD We will order PFTs to confirm diagnosis - umeclidinium-vilanterol (ANORO ELLIPTA) 62.5-25 MCG/INH AEPB; Inhale 1 puff into the lungs daily.  Dispense: 60 each; Refill: 6 - albuterol (VENTOLIN HFA) 108 (90 Base) MCG/ACT inhaler; Inhale 2 puffs into the lungs every 6 (six) hours as needed for wheezing or shortness of breath.  Dispense: 18 g; Refill: 6 - Pulmonary function test; Future  7. Tobacco abuse Spent 3 minutes counseling on smoking cessation and he is not ready to quit    Return in about 3 months (around 05/27/2020) for Chronic disease management.    Hoy Register, MD, FAAFP. [facility] [place], [place] [phone]   02/25/2020, 11:24 AM

## 2020-03-01 ENCOUNTER — Ambulatory Visit: Payer: Self-pay | Admitting: Family Medicine

## 2020-03-01 NOTE — Telephone Encounter (Signed)
Unable to reach patient after several attempts by NT, routing to office for resolution.  Reason patient called:  Summary: please advise   Pt last seen dr Alvis Lemmings 02-25-2020. Pt has been having constipation on and off for about 2 wk. Pt has tried OTC ducolax and magnesium . Pt is calling and would like to know a good medication to try. Pt mention maybe a prescription medication. Pt is aware he may need another appt. Pt said he got out of hospital weeks ago.

## 2020-03-02 MED ORDER — POLYETHYLENE GLYCOL 3350 17 GM/SCOOP PO POWD: 17.0000 g | Freq: Every day | ORAL | 1 refills | Status: DC

## 2020-03-02 NOTE — Telephone Encounter (Signed)
Prescription for MiraLAX sent to pharmacy

## 2020-03-02 NOTE — Addendum Note (Signed)
Addended by: Hoy Register on: 03/02/2020 05:40 PM   Modules accepted: Orders

## 2020-03-07 ENCOUNTER — Ambulatory Visit (INDEPENDENT_AMBULATORY_CARE_PROVIDER_SITE_OTHER): Payer: Medicare Other | Admitting: Cardiology

## 2020-03-07 ENCOUNTER — Encounter: Payer: Self-pay | Admitting: Cardiology

## 2020-03-07 ENCOUNTER — Other Ambulatory Visit: Payer: Self-pay

## 2020-03-07 VITALS — BP 120/60 | HR 84 | Ht 72.0 in | Wt 200.0 lb

## 2020-03-07 DIAGNOSIS — Z720 Tobacco use: Secondary | ICD-10-CM

## 2020-03-07 DIAGNOSIS — I4891 Unspecified atrial fibrillation: Secondary | ICD-10-CM | POA: Diagnosis not present

## 2020-03-07 DIAGNOSIS — J90 Pleural effusion, not elsewhere classified: Secondary | ICD-10-CM

## 2020-03-07 DIAGNOSIS — I5032 Chronic diastolic (congestive) heart failure: Secondary | ICD-10-CM | POA: Diagnosis not present

## 2020-03-07 NOTE — Patient Instructions (Signed)
Medication Instructions:  Your physician recommends that you continue on your current medications as directed. Please refer to the Current Medication list given to you today.  *If you need a refill on your cardiac medications before your next appointment, please call your pharmacy*   Lab Work: NONE If you have labs (blood work) drawn today and your tests are completely normal, you will receive your results only by: Marland Kitchen MyChart Message (if you have MyChart) OR . A paper copy in the mail If you have any lab test that is abnormal or we need to change your treatment, we will call you to review the results.   Testing/Procedures: NONE   Follow-Up: At [facility], you and your health needs are our priority.  As part of our continuing mission to provide you with exceptional heart care, we have created designated Provider Care Teams.  These Care Teams include your primary Cardiologist (physician) and Advanced Practice Providers (APPs -  Physician Assistants and Nurse Practitioners) who all work together to provide you with the care you need, when you need it.  We recommend signing up for the patient portal called "MyChart".  Sign up information is provided on this After Visit Summary.  MyChart is used to connect with patients for Virtual Visits (Telemedicine).  Patients are able to view lab/test results, encounter notes, upcoming appointments, etc.  Non-urgent messages can be sent to your provider as well.   To learn more about what you can do with MyChart, go to [email or web address].    Your next appointment:   2-3 month(s)  The format for your next appointment:   In Person  Provider:   You may see Dr. Elease Hashimoto or one of the following Advanced Practice Providers on your designated Care Team:    Tereso Newcomer, PA-C  [place]in Arnold, [place]

## 2020-03-24 ENCOUNTER — Other Ambulatory Visit: Payer: Self-pay | Admitting: Family Medicine

## 2020-03-24 NOTE — Telephone Encounter (Signed)
Requested  medications are  due for refill today yes  Requested medications are on the active medication list yes  Last refill 6/28  Last visit 02/2020  Future visit scheduled Yes 05/2020  Notes to clinic Historical provider.

## 2020-04-08 ENCOUNTER — Ambulatory Visit: Payer: Medicare Other | Admitting: Physician Assistant

## 2020-04-25 ENCOUNTER — Other Ambulatory Visit: Payer: Self-pay | Admitting: Family Medicine

## 2020-04-28 ENCOUNTER — Other Ambulatory Visit: Payer: Self-pay | Admitting: Family Medicine

## 2020-04-28 ENCOUNTER — Other Ambulatory Visit: Payer: Self-pay

## 2020-04-28 DIAGNOSIS — E559 Vitamin D deficiency, unspecified: Secondary | ICD-10-CM

## 2020-04-28 DIAGNOSIS — I4891 Unspecified atrial fibrillation: Secondary | ICD-10-CM

## 2020-04-28 NOTE — Telephone Encounter (Signed)
Requested medication (s) are due for refill today: yes   Requested medication (s) are on the active medication list: yes   Last refill:  02/25/2020  Future visit scheduled: yes   Notes to clinic:  this refill cannot be delegated    Requested Prescriptions  Pending Prescriptions Disp Refills   ergocalciferol (DRISDOL) 1.25 MG (50000 UT) capsule 12 capsule 0    Sig: Take 1 capsule (50,000 Units total) by mouth once a week.      Endocrinology:  Vitamins - Vitamin D Supplementation Failed - 04/28/2020 10:02 AM      Failed - 50,000 IU strengths are not delegated      Failed - Phosphate in normal range and within 360 days    No results found for: PHOS        Failed - Vitamin D in normal range and within 360 days    Vit D, 25-Hydroxy  Date Value Ref Range Status  11/11/2019 9.6 (L) 30.0 - 100.0 ng/mL Final    Comment:    Vitamin D deficiency has been defined by the Institute of Medicine and an Endocrine Society practice guideline as a level of serum 25-OH vitamin D less than 20 ng/mL (1,2). The Endocrine Society went on to further define vitamin D insufficiency as a level between 21 and 29 ng/mL (2). 1. IOM (Institute of Medicine). 2010. Dietary reference    intakes for calcium and D. [place]: The    [employer]. 2. Holick MF, Binkley [place], Bischoff-Ferrari HA, et al.    Evaluation, treatment, and prevention of vitamin D    deficiency: an Endocrine Society clinical practice    guideline. JCEM. 2011 Jul; 96(7):1911-30.           Passed - Ca in normal range and within 360 days    Calcium  Date Value Ref Range Status  02/09/2020 8.9 8.9 - 10.3 mg/dL Final          Passed - Valid encounter within last 12 months    Recent Outpatient Visits           2 months ago Atrial fibrillation with RVR [facility])   [facility] Hoy Register, MD   4 months ago Pleural effusion, right   [facility] Storm Frisk, MD   5 months ago Atrial fibrillation with rapid ventricular response [facility])   [facility] Hoy Register, MD       Future Appointments             In 1 month Hoy Register, MD [facility]   In 1 month Nahser, Deloris Ping, MD [facility], [facility]

## 2020-05-27 ENCOUNTER — Other Ambulatory Visit: Payer: Self-pay | Admitting: Family Medicine

## 2020-05-27 DIAGNOSIS — I4891 Unspecified atrial fibrillation: Secondary | ICD-10-CM

## 2020-05-27 DIAGNOSIS — E559 Vitamin D deficiency, unspecified: Secondary | ICD-10-CM

## 2020-05-27 NOTE — Telephone Encounter (Signed)
Requested medication (s) are due for refill today: yes  Requested medication (s) are on the active medication list: yes  Last refill: 02/25/20  Future visit scheduled: yes  Notes to clinic:  not delegated   Requested Prescriptions  Pending Prescriptions Disp Refills   Vitamin D, Ergocalciferol, (DRISDOL) 1.25 MG (50000 UNIT) CAPS capsule [Pharmacy Med Name: VITAMIN D2 50,000IU (ERGO) CAP RX] 12 capsule 0    Sig: TAKE 1 CAPSULE BY MOUTH 1 TIME A WEEK      There is no refill protocol information for this order     Signed Prescriptions Disp Refills   ELIQUIS 5 MG TABS tablet 60 tablet 1    Sig: TAKE 1 TABLET BY MOUTH TWICE DAILY      There is no refill protocol information for this order      metoprolol tartrate (LOPRESSOR) 25 MG tablet 60 tablet 2    Sig: TAKE 1 TABLET(25 MG) BY MOUTH TWICE DAILY      There is no refill protocol information for this order      diltiazem (CARDIZEM CD) 180 MG 24 hr capsule 30 capsule 2    Sig: TAKE 1 CAPSULE(180 MG) BY MOUTH DAILY      There is no refill protocol information for this order      furosemide (LASIX) 40 MG tablet 45 tablet 2    Sig: TAKE 1 TABLET BY MOUTH DAILY. IF YOUR WEIGHT GOES UP BY 2-3 POUNDS IN A DAY OR 5 POUNDS IN A WEEK, TAKE 1 EXTRA TABLET OF LASIX      There is no refill protocol information for this order

## 2020-05-27 NOTE — Telephone Encounter (Signed)
Requested Prescriptions  Pending Prescriptions Disp Refills   ELIQUIS 5 MG TABS tablet [Pharmacy Med Name: ELIQUIS 5MG  TABLETS] 60 tablet 1    Sig: TAKE 1 TABLET BY MOUTH TWICE DAILY     There is no refill protocol information for this order     metoprolol tartrate (LOPRESSOR) 25 MG tablet [Pharmacy Med Name: METOPROLOL TARTRATE 25MG  TABLETS] 60 tablet 2    Sig: TAKE 1 TABLET(25 MG) BY MOUTH TWICE DAILY     There is no refill protocol information for this order     diltiazem (CARDIZEM CD) 180 MG 24 hr capsule [Pharmacy Med Name: DILTIAZEM CD 180MG  CAPSULES (24 HR)] 30 capsule 2    Sig: TAKE 1 CAPSULE(180 MG) BY MOUTH DAILY     There is no refill protocol information for this order     furosemide (LASIX) 40 MG tablet [Pharmacy Med Name: FUROSEMIDE 40MG  TABLETS] 45 tablet 2    Sig: TAKE 1 TABLET BY MOUTH DAILY. IF YOUR WEIGHT GOES UP BY 2-3 POUNDS IN A DAY OR 5 POUNDS IN A WEEK, TAKE 1 EXTRA TABLET OF LASIX     There is no refill protocol information for this order     Vitamin D, Ergocalciferol, (DRISDOL) 1.25 MG (50000 UNIT) CAPS capsule [Pharmacy Med Name: VITAMIN D2 50,000IU (ERGO) CAP RX] 12 capsule 0    Sig: TAKE 1 CAPSULE BY MOUTH 1 TIME A WEEK     There is no refill protocol information for this order

## 2020-05-30 ENCOUNTER — Other Ambulatory Visit: Payer: Self-pay

## 2020-05-30 ENCOUNTER — Encounter: Payer: Self-pay | Admitting: Family Medicine

## 2020-05-30 ENCOUNTER — Ambulatory Visit: Payer: Medicare Other | Attending: Family Medicine | Admitting: Family Medicine

## 2020-05-30 VITALS — BP 114/60 | HR 84 | Ht 72.0 in | Wt 185.8 lb

## 2020-05-30 DIAGNOSIS — I4891 Unspecified atrial fibrillation: Secondary | ICD-10-CM

## 2020-05-30 DIAGNOSIS — R0602 Shortness of breath: Secondary | ICD-10-CM

## 2020-05-30 DIAGNOSIS — I5032 Chronic diastolic (congestive) heart failure: Secondary | ICD-10-CM

## 2020-05-30 DIAGNOSIS — H6121 Impacted cerumen, right ear: Secondary | ICD-10-CM

## 2020-05-30 DIAGNOSIS — Z720 Tobacco use: Secondary | ICD-10-CM

## 2020-05-30 DIAGNOSIS — R7303 Prediabetes: Secondary | ICD-10-CM

## 2020-05-30 MED ORDER — SPIRIVA RESPIMAT 2.5 MCG/ACT IN AERS: 2.0000 | INHALATION_SPRAY | Freq: Every day | RESPIRATORY_TRACT | 3 refills | Status: DC

## 2020-05-30 NOTE — Progress Notes (Signed)
Subjective:  Patient ID: Alfred Mercado, male    DOB: 1936/08/11  Age: 84 y.o. MRN: [id number]  CC: Hearing Problem   HPI Alfred Mercado is an 84 year old male with a history of atrial fibrillation with RVR, heart failure with preserved ejection fraction (EF 50 to 55%), history of pleural effusion status post thoracocentesis x2 here for follow-up visit. During last hospitalization he had refused repeated thoracocentesis and informs me today he is not open to doing so in the future  He has trouble hearing out of both ears but denies presence of otalgia, fever, upper respiratory symptoms. He does have some dyspnea while going up hills and is wondering if he can obtain his motorized scooter which his neighbor has. Anoro prescribed at discharge and he states insurance would not cover but he has been using his Proventil MDI.  I referred him for PFTs at his last visit in 02/2019 however he never underwent this and he continues to smoke; not ready to quit.  He denies presence of palpitations, chest pain, syncope. Past Medical History:  Diagnosis Date   Acute heart failure with preserved ejection fraction (HCC)    Atrial fibrillation with rapid ventricular response (HCC) 07/11/2019    Past Surgical History:  Procedure Laterality Date   APPENDECTOMY      Family History  Problem Relation Age of Onset   Aneurysm Father     No Known Allergies  Outpatient Medications Prior to Visit  Medication Sig Dispense Refill   acetaminophen (TYLENOL) 325 MG tablet Take 650 mg by mouth every 6 (six) hours as needed for mild pain or headache.     albuterol (VENTOLIN HFA) 108 (90 Base) MCG/ACT inhaler Inhale 2 puffs into the lungs every 6 (six) hours as needed for wheezing or shortness of breath. 18 g 6   diltiazem (CARDIZEM CD) 180 MG 24 hr capsule TAKE 1 CAPSULE(180 MG) BY MOUTH DAILY 30 capsule 2   ELIQUIS 5 MG TABS tablet TAKE 1 TABLET BY MOUTH TWICE DAILY 60 tablet 1   furosemide (LASIX)  40 MG tablet TAKE 1 TABLET BY MOUTH DAILY. IF YOUR WEIGHT GOES UP BY 2-3 POUNDS IN A DAY OR 5 POUNDS IN A WEEK, TAKE 1 EXTRA TABLET OF LASIX 45 tablet 2   metoprolol tartrate (LOPRESSOR) 25 MG tablet TAKE 1 TABLET(25 MG) BY MOUTH TWICE DAILY 60 tablet 2   nicotine (NICODERM CQ - DOSED IN MG/24 HOURS) 21 mg/24hr patch Place 1 patch (21 mg total) onto the skin daily. 28 patch 0   polyethylene glycol powder (GLYCOLAX/MIRALAX) 17 GM/SCOOP powder Take 17 g by mouth daily. 3350 g 1   Vitamin D, Ergocalciferol, (DRISDOL) 1.25 MG (50000 UNIT) CAPS capsule TAKE 1 CAPSULE BY MOUTH 1 TIME A WEEK 12 capsule 0   No facility-administered medications prior to visit.     ROS Review of Systems  Constitutional: Negative for activity change and appetite change.  HENT: Positive for hearing loss. Negative for sinus pressure and sore throat.   Eyes: Negative for visual disturbance.  Respiratory: Positive for shortness of breath. Negative for cough and chest tightness.   Cardiovascular: Negative for chest pain and leg swelling.  Gastrointestinal: Negative for abdominal distention, abdominal pain, constipation and diarrhea.  Endocrine: Negative.   Genitourinary: Negative for dysuria.  Musculoskeletal: Negative for joint swelling and myalgias.  Skin: Negative for rash.  Allergic/Immunologic: Negative.   Neurological: Negative for weakness, light-headedness and numbness.  Psychiatric/Behavioral: Negative for dysphoric mood and suicidal ideas.    Objective:  BP 114/60    Pulse 84    Ht 6' (1.829 m)    Wt 185 lb 12.8 oz (84.3 kg)    SpO2 92%    BMI 25.20 kg/m   BP/Weight 05/30/2020 03/07/2020 02/25/2020  Systolic BP 114 120 111  Diastolic BP 60 60 63  Wt. (Lbs) 185.8 200 199  BMI 25.2 27.12 25.55      Physical Exam Constitutional:      Appearance: He is well-developed.  Neck:     Vascular: No JVD.  Cardiovascular:     Rate and Rhythm: Normal rate.     Heart sounds: Normal heart sounds. No murmur  heard.   Pulmonary:     Effort: Pulmonary effort is normal.     Breath sounds: Normal breath sounds. No wheezing or rales.  Chest:     Chest wall: No tenderness.  Abdominal:     General: Bowel sounds are normal. There is no distension.     Palpations: Abdomen is soft. There is no mass.     Tenderness: There is no abdominal tenderness.  Musculoskeletal:        General: Normal range of motion.     Right lower leg: No edema.     Left lower leg: No edema.  Neurological:     Mental Status: He is alert and oriented to person, place, and time.  Psychiatric:        Mood and Affect: Mood normal.     CMP Latest Ref Rng & Units 02/09/2020 02/08/2020 02/07/2020  Glucose 70 - 99 mg/dL 341(P) 379(K) 240(X)  BUN 8 - 23 mg/dL 18 16 13   Creatinine 0.61 - 1.24 mg/dL 7.35 3.29)  Sodium 135 - 145 mmol/L 139 139 139  Potassium 3.5 - 5.1 mmol/L 3.6 3.3(L) 3.6  Chloride 98 - 111 mmol/L 101 101 100  CO2 22 - 32 mmol/L 29 29 29   Calcium 8.9 - 10.3 mg/dL 8.9 8.9 9.1  Total Protein 6.5 - 8.1 g/dL - - -  Total Bilirubin 0.3 - 1.2 mg/dL - - -  Alkaline Phos 38 - 126 U/L - - -  AST 15 - 41 U/L - - -  ALT 0 - 44 U/L - - -    Lipid Panel     Component Value Date/Time   CHOL 147 07/13/2019 0457   TRIG 77 07/13/2019 0457   HDL 38 (L) 07/13/2019 0457   CHOLHDL 3.9 07/13/2019 0457   VLDL 15 07/13/2019 0457   LDLCALC 94 07/13/2019 0457    CBC    Component Value Date/Time   WBC 6.2 02/07/2020 0919   RBC 4.29 02/07/2020 0919   HGB 13.0 02/07/2020 0919   HGB 13.4 01/07/2020 1014   HCT 39.2 02/07/2020 0919   HCT 37.6 01/07/2020 1014   PLT 235 02/07/2020 0919   PLT 284 01/07/2020 1014   MCV 91.4 02/07/2020 0919   MCV 87 01/07/2020 1014   MCH 30.3 02/07/2020 0919   MCHC 33.2 02/07/2020 0919   RDW 12.7 02/07/2020 0919   RDW 12.1 01/07/2020 1014   LYMPHSABS 1.3 01/07/2020 1014   MONOABS 0.5 10/14/2019 1330   EOSABS 0.1 01/07/2020 1014   BASOSABS 0.1 01/07/2020 1014    Lab Results   Component Value Date   HGBA1C 5.7 02/25/2020    Assessment & Plan:  1. Atrial fibrillation with RVR (HCC) Currently in sinus rhythm Continue metoprolol for rate control and Eliquis for anticoagulation Follow-up with Cardiology  2. Chronic heart failure with  preserved ejection fraction (HCC) EF of 50 to 55% Euvolemic Continue heart healthy diet, Lasix  3. Prediabetes Controlled with A1c of 5.7 Lifestyle modification to prevent development of diabetes mellitus  4. Tobacco abuse Spent 3 minutes counseling on cessation and he is not willing to quit  5. Impacted cerumen of right ear Right ear irrigation performed in the clinic  6. Exertional shortness of breath Likely has COPD given history of smoking Substitute Anoro Ellipta with Spiriva due to complaints about insurance Ordered PFTs for him and will have my nurse attempt to reschedule this again. Advised that dyspnea alone is insufficient to qualify him for motorized scooter applies insurance guidelines. - Tiotropium Bromide Monohydrate (SPIRIVA RESPIMAT) 2.5 MCG/ACT AERS; Inhale 2 puffs into the lungs daily.  Dispense: 30 each; Refill: 3    Meds ordered this encounter  Medications   Tiotropium Bromide Monohydrate (SPIRIVA RESPIMAT) 2.5 MCG/ACT AERS    Sig: Inhale 2 puffs into the lungs daily.    Dispense:  30 each    Refill:  3    Follow-up: Return in about 6 months (around 11/28/2020) for chronic disease management.       Hoy Register, MD, FAAFP. [facility] [place], [place] [phone]   05/30/2020, 12:20 PM

## 2020-05-30 NOTE — Progress Notes (Signed)
States that he is having trouble hearing.  Would like a power wheelchair to help with getting around.  Gets off balance.

## 2020-05-31 ENCOUNTER — Ambulatory Visit: Payer: Medicare Other | Admitting: Cardiovascular Disease

## 2020-06-02 ENCOUNTER — Emergency Department (HOSPITAL_COMMUNITY): Payer: Medicare Other

## 2020-06-02 ENCOUNTER — Inpatient Hospital Stay (HOSPITAL_COMMUNITY)
Admission: EM | Admit: 2020-06-02 | Discharge: 2020-06-06 | DRG: 291 | Disposition: A | Discharge status: Home Health | Payer: Medicare Other | Attending: Internal Medicine | Admitting: Internal Medicine

## 2020-06-02 ENCOUNTER — Encounter (HOSPITAL_COMMUNITY): Payer: Self-pay | Admitting: Emergency Medicine

## 2020-06-02 ENCOUNTER — Other Ambulatory Visit: Payer: Self-pay

## 2020-06-02 DIAGNOSIS — I5033 Acute on chronic diastolic (congestive) heart failure: Principal | ICD-10-CM | POA: Diagnosis present

## 2020-06-02 DIAGNOSIS — R7989 Other specified abnormal findings of blood chemistry: Secondary | ICD-10-CM

## 2020-06-02 DIAGNOSIS — N179 Acute kidney failure, unspecified: Secondary | ICD-10-CM | POA: Diagnosis present

## 2020-06-02 DIAGNOSIS — N390 Urinary tract infection, site not specified: Secondary | ICD-10-CM | POA: Diagnosis present

## 2020-06-02 DIAGNOSIS — I4891 Unspecified atrial fibrillation: Secondary | ICD-10-CM | POA: Diagnosis present

## 2020-06-02 DIAGNOSIS — J449 Chronic obstructive pulmonary disease, unspecified: Secondary | ICD-10-CM | POA: Diagnosis present

## 2020-06-02 DIAGNOSIS — J440 Chronic obstructive pulmonary disease with acute lower respiratory infection: Secondary | ICD-10-CM | POA: Diagnosis not present

## 2020-06-02 DIAGNOSIS — Z79899 Other long term (current) drug therapy: Secondary | ICD-10-CM

## 2020-06-02 DIAGNOSIS — F17210 Nicotine dependence, cigarettes, uncomplicated: Secondary | ICD-10-CM | POA: Diagnosis present

## 2020-06-02 DIAGNOSIS — I5030 Unspecified diastolic (congestive) heart failure: Secondary | ICD-10-CM | POA: Diagnosis not present

## 2020-06-02 DIAGNOSIS — Z20822 Contact with and (suspected) exposure to covid-19: Secondary | ICD-10-CM | POA: Diagnosis present

## 2020-06-02 DIAGNOSIS — J90 Pleural effusion, not elsewhere classified: Secondary | ICD-10-CM

## 2020-06-02 DIAGNOSIS — Z7901 Long term (current) use of anticoagulants: Secondary | ICD-10-CM

## 2020-06-02 DIAGNOSIS — E876 Hypokalemia: Secondary | ICD-10-CM

## 2020-06-02 DIAGNOSIS — J189 Pneumonia, unspecified organism: Secondary | ICD-10-CM | POA: Diagnosis not present

## 2020-06-02 DIAGNOSIS — I110 Hypertensive heart disease with heart failure: Secondary | ICD-10-CM | POA: Diagnosis not present

## 2020-06-02 DIAGNOSIS — J9601 Acute respiratory failure with hypoxia: Secondary | ICD-10-CM

## 2020-06-02 DIAGNOSIS — H9190 Unspecified hearing loss, unspecified ear: Secondary | ICD-10-CM | POA: Diagnosis present

## 2020-06-02 DIAGNOSIS — N3001 Acute cystitis with hematuria: Secondary | ICD-10-CM

## 2020-06-02 LAB — CBC WITH DIFFERENTIAL/PLATELET
Abs Immature Granulocytes: 0.07 10*3/uL (ref 0.00–0.07)
Basophils Absolute: 0 10*3/uL (ref 0.0–0.1)
Basophils Relative: 0 %
Eosinophils Absolute: 0 10*3/uL (ref 0.0–0.5)
Eosinophils Relative: 0 %
HCT: 33.3 % — ABNORMAL LOW (ref 39.0–52.0)
Hemoglobin: 11 g/dL — ABNORMAL LOW (ref 13.0–17.0)
Immature Granulocytes: 1 %
Lymphocytes Relative: 5 %
Lymphs Abs: 0.6 10*3/uL — ABNORMAL LOW (ref 0.7–4.0)
MCH: 28.9 pg (ref 26.0–34.0)
MCHC: 33 g/dL (ref 30.0–36.0)
MCV: 87.6 fL (ref 80.0–100.0)
Monocytes Absolute: 0.9 10*3/uL (ref 0.1–1.0)
Monocytes Relative: 7 %
Neutro Abs: 10.3 10*3/uL — ABNORMAL HIGH (ref 1.7–7.7)
Neutrophils Relative %: 87 %
Platelets: 187 10*3/uL (ref 150–400)
RBC: 3.8 MIL/uL — ABNORMAL LOW (ref 4.22–5.81)
RDW: 13 % (ref 11.5–15.5)
WBC: 11.8 10*3/uL — ABNORMAL HIGH (ref 4.0–10.5)
nRBC: 0 % (ref 0.0–0.2)

## 2020-06-02 LAB — URINALYSIS, ROUTINE W REFLEX MICROSCOPIC
Bilirubin Urine: NEGATIVE
Glucose, UA: NEGATIVE mg/dL
Ketones, ur: NEGATIVE mg/dL
Nitrite: POSITIVE — AB
Protein, ur: 30 mg/dL — AB
RBC / HPF: >50 RBC/hpf — ABNORMAL HIGH (ref 0–5)
Specific Gravity, Urine: 1.008 (ref 1.005–1.030)
WBC, UA: >50 WBC/hpf — ABNORMAL HIGH (ref 0–5)
pH: 7 (ref 5.0–8.0)

## 2020-06-02 LAB — BASIC METABOLIC PANEL
Anion gap: 12 (ref 5–15)
Anion gap: 10 (ref 5–15)
BUN: 14 mg/dL (ref 8–23)
BUN: 16 mg/dL (ref 8–23)
CO2: 26 mmol/L (ref 22–32)
CO2: 26 mmol/L (ref 22–32)
Calcium: 8.2 mg/dL — ABNORMAL LOW (ref 8.9–10.3)
Calcium: 8 mg/dL — ABNORMAL LOW (ref 8.9–10.3)
Chloride: 99 mmol/L (ref 98–111)
Chloride: 100 mmol/L (ref 98–111)
Creatinine, Ser: 1.37 mg/dL — ABNORMAL HIGH (ref 0.61–1.24)
Creatinine, Ser: 1.44 mg/dL — ABNORMAL HIGH (ref 0.61–1.24)
GFR calc non Af Amer: 48 mL/min — ABNORMAL LOW (ref >60)
GFR calc non Af Amer: 45 mL/min — ABNORMAL LOW (ref >60)
Glucose, Bld: 136 mg/dL — ABNORMAL HIGH (ref 70–99)
Glucose, Bld: 120 mg/dL — ABNORMAL HIGH (ref 70–99)
Potassium: 2.7 mmol/L — CL (ref 3.5–5.1)
Potassium: 3 mmol/L — ABNORMAL LOW (ref 3.5–5.1)
Sodium: 137 mmol/L (ref 135–145)
Sodium: 136 mmol/L (ref 135–145)

## 2020-06-02 LAB — PROCALCITONIN: Procalcitonin: 0.77 ng/mL

## 2020-06-02 LAB — BRAIN NATRIURETIC PEPTIDE: B Natriuretic Peptide: 395.7 pg/mL — ABNORMAL HIGH (ref 0.0–100.0)

## 2020-06-02 LAB — RESPIRATORY PANEL BY RT PCR (FLU A&B, COVID)
Influenza A by PCR: NEGATIVE
Influenza B by PCR: NEGATIVE
SARS Coronavirus 2 by RT PCR: NEGATIVE

## 2020-06-02 LAB — MAGNESIUM: Magnesium: 1.5 mg/dL — ABNORMAL LOW (ref 1.7–2.4)

## 2020-06-02 MED ORDER — SODIUM CHLORIDE 0.9 % IV SOLN
1.0000 g | INTRAVENOUS | Status: DC
  Administered 2020-06-03 – 2020-06-04 (×2): 1 g via INTRAVENOUS
  Filled 2020-06-02 (×2): qty 10

## 2020-06-02 MED ORDER — APIXABAN 5 MG PO TABS
5.0000 mg | ORAL_TABLET | Freq: Two times a day (BID) | ORAL | Status: DC
  Administered 2020-06-02 – 2020-06-06 (×8): 5 mg via ORAL
  Filled 2020-06-02 (×8): qty 1

## 2020-06-02 MED ORDER — POTASSIUM CHLORIDE 10 MEQ/100ML IV SOLN
10.0000 meq | INTRAVENOUS | Status: AC
  Administered 2020-06-02 – 2020-06-03 (×6): 10 meq via INTRAVENOUS
  Filled 2020-06-02 (×5): qty 100

## 2020-06-02 MED ORDER — SODIUM CHLORIDE 0.9 % IV SOLN
500.0000 mg | Freq: Once | INTRAVENOUS | Status: AC
  Administered 2020-06-02: 500 mg via INTRAVENOUS
  Filled 2020-06-02: qty 500

## 2020-06-02 MED ORDER — METOPROLOL TARTRATE 25 MG PO TABS
25.0000 mg | ORAL_TABLET | Freq: Two times a day (BID) | ORAL | Status: DC
  Administered 2020-06-02 – 2020-06-06 (×8): 25 mg via ORAL
  Filled 2020-06-02 (×8): qty 1

## 2020-06-02 MED ORDER — ACETAMINOPHEN 650 MG RE SUPP: 650.0000 mg | Freq: Four times a day (QID) | RECTAL | Status: DC | PRN

## 2020-06-02 MED ORDER — SODIUM CHLORIDE 0.9 % IV SOLN
1.0000 g | Freq: Once | INTRAVENOUS | Status: AC
  Administered 2020-06-02: 1 g via INTRAVENOUS
  Filled 2020-06-02: qty 10

## 2020-06-02 MED ORDER — MAGNESIUM SULFATE 50 % IJ SOLN
5.0000 g | Freq: Once | INTRAVENOUS | Status: AC
  Administered 2020-06-02: 5 g via INTRAVENOUS
  Filled 2020-06-02: qty 10

## 2020-06-02 MED ORDER — DILTIAZEM HCL ER COATED BEADS 180 MG PO CP24
180.0000 mg | ORAL_CAPSULE | Freq: Every day | ORAL | Status: DC
  Administered 2020-06-03 – 2020-06-06 (×4): 180 mg via ORAL
  Filled 2020-06-02 (×4): qty 1

## 2020-06-02 MED ORDER — POLYETHYLENE GLYCOL 3350 17 G PO PACK: 17.0000 g | PACK | Freq: Every day | ORAL | Status: DC | PRN

## 2020-06-02 MED ORDER — ONDANSETRON HCL 4 MG PO TABS: 4.0000 mg | ORAL_TABLET | Freq: Four times a day (QID) | ORAL | Status: DC | PRN

## 2020-06-02 MED ORDER — FUROSEMIDE 20 MG PO TABS: 40.0000 mg | ORAL_TABLET | ORAL | Status: DC

## 2020-06-02 MED ORDER — POTASSIUM CHLORIDE 10 MEQ/100ML IV SOLN
10.0000 meq | INTRAVENOUS | Status: AC
  Administered 2020-06-02 (×2): 10 meq via INTRAVENOUS
  Filled 2020-06-02 (×2): qty 100

## 2020-06-02 MED ORDER — DEXTROSE 5 % IV SOLN
250.0000 mg | INTRAVENOUS | Status: DC
  Administered 2020-06-03: 250 mg via INTRAVENOUS
  Filled 2020-06-02: qty 250

## 2020-06-02 MED ORDER — POTASSIUM CHLORIDE CRYS ER 20 MEQ PO TBCR
40.0000 meq | EXTENDED_RELEASE_TABLET | Freq: Once | ORAL | Status: AC
  Administered 2020-06-02: 40 meq via ORAL
  Filled 2020-06-02: qty 2

## 2020-06-02 MED ORDER — VITAMIN D (ERGOCALCIFEROL) 1.25 MG (50000 UNIT) PO CAPS
50000.0000 [IU] | ORAL_CAPSULE | ORAL | Status: DC
  Administered 2020-06-04: 50000 [IU] via ORAL
  Filled 2020-06-02: qty 1

## 2020-06-02 MED ORDER — ONDANSETRON HCL 4 MG/2ML IJ SOLN: 4.0000 mg | Freq: Four times a day (QID) | INTRAMUSCULAR | Status: DC | PRN

## 2020-06-02 MED ORDER — UMECLIDINIUM BROMIDE 62.5 MCG/INH IN AEPB
1.0000 | INHALATION_SPRAY | Freq: Every day | RESPIRATORY_TRACT | Status: DC
  Administered 2020-06-03 – 2020-06-04 (×2): 1 via RESPIRATORY_TRACT
  Filled 2020-06-02: qty 7

## 2020-06-02 MED ORDER — ACETAMINOPHEN 325 MG PO TABS
650.0000 mg | ORAL_TABLET | Freq: Four times a day (QID) | ORAL | Status: DC | PRN
  Administered 2020-06-03 – 2020-06-05 (×2): 650 mg via ORAL
  Filled 2020-06-02 (×2): qty 2

## 2020-06-02 MED ORDER — FUROSEMIDE 10 MG/ML IJ SOLN
60.0000 mg | Freq: Once | INTRAMUSCULAR | Status: AC
  Administered 2020-06-02: 60 mg via INTRAVENOUS
  Filled 2020-06-02 (×2): qty 6

## 2020-06-02 MED ORDER — ALBUTEROL SULFATE (2.5 MG/3ML) 0.083% IN NEBU: 3.0000 mL | INHALATION_SOLUTION | Freq: Four times a day (QID) | RESPIRATORY_TRACT | Status: DC | PRN

## 2020-06-02 NOTE — H&P (Addendum)
Date: 06/02/2020               Patient Name:  Alfred Mercado MRN: [id number]  DOB: Jan 21, 1936 Age / Sex: 84 y.o., male   PCP: Hoy Register, MD         Medical Service: Internal Medicine Teaching Service         Attending Physician: Dr. Gust Rung, DO    First Contact: Dr. Verdis Prime Pager: [phone]  Second Contact: Dr. Sande Brothers Pager: [phone]       After Hours (After 5p/  First Contact Pager: [phone]  weekends / holidays): Second Contact Pager: [phone]   Chief Complaint: Chills, urinary frequeny  History of Present Illness: HPI: 84 year old male with past medical history of A. fib on Eliquis, HFpEF (EF 50-55%), history of pleural effusion s/p thoracentesis x2, presented for chills, urinary frequency and fatigue. His symptoms started yesterday.  He denies any associated fever, but felt cold yesterday. He is on lasix at home but mentions that his frequency was more than before yesterday. No dysuria, flank pain. No cough. He had some mild intermittent chest pain yesterday that occurred mostly when he lay down and resolves when he sits up. At baseline, he has dyspnea on exertion but mentions that it has not gotten worse recently. Denies any orthopnea or lower extremity edema. No exposure to Covid and He has been vaccinated against COVID-19. Review of system is negative for headache, dizziness,  nausea, vomiting, diarrhea.    Meds:  Current Meds  Medication Sig  . acetaminophen (TYLENOL) 325 MG tablet Take 650 mg by mouth every 6 (six) hours as needed for mild pain or headache.  . albuterol (VENTOLIN HFA) 108 (90 Base) MCG/ACT inhaler Inhale 2 puffs into the lungs every 6 (six) hours as needed for wheezing or shortness of breath.  . diltiazem (CARDIZEM CD) 180 MG 24 hr capsule TAKE 1 CAPSULE(180 MG) BY MOUTH DAILY (Patient taking differently: Take 180 mg by mouth daily. )  . ELIQUIS 5 MG TABS tablet TAKE 1 TABLET BY MOUTH TWICE DAILY (Patient taking differently: Take  5 mg by mouth 2 (two) times daily. )  . furosemide (LASIX) 40 MG tablet TAKE 1 TABLET BY MOUTH DAILY. IF YOUR WEIGHT GOES UP BY 2-3 POUNDS IN A DAY OR 5 POUNDS IN A WEEK, TAKE 1 EXTRA TABLET OF LASIX (Patient taking differently: Take 40 mg by mouth See admin instructions. )  . metoprolol tartrate (LOPRESSOR) 25 MG tablet TAKE 1 TABLET(25 MG) BY MOUTH TWICE DAILY (Patient taking differently: Take 25 mg by mouth 2 (two) times daily. )  . polyethylene glycol powder (GLYCOLAX/MIRALAX) 17 GM/SCOOP powder Take 17 g by mouth daily.  . Vitamin D, Ergocalciferol, (DRISDOL) 1.25 MG (50000 UNIT) CAPS capsule TAKE 1 CAPSULE BY MOUTH 1 TIME A WEEK (Patient taking differently: Take 50,000 Units by mouth every 7 (seven) days. Usually Wednesday)     Allergies: Allergies as of 06/02/2020  . (No Known Allergies)   Past Medical History:  Diagnosis Date  . Acute heart failure with preserved ejection fraction (HCC)   . Atrial fibrillation with rapid ventricular response (HCC) 07/11/2019    Family History:  Family History  Problem Relation Age of Onset  . Aneurysm Father     Social History:  Lives alone. Smokes cigarettes a day, no alcohol use, no illicit drug use  Review of Systems: A complete ROS was negative except as per HPI.   Physical Exam: Blood pressure 101/89, pulse 88, temperature  98.8 F (37.1 C), temperature source Oral, resp. rate (!) 33, SpO2 96 %. Physical Exam Constitutional:      Appearance: Normal appearance.  Cardiovascular:     Rate and Rhythm: Rhythm irregular.     Pulses: Normal pulses.     Heart sounds: No murmur heard.      Comments: No JVD Pulmonary:     Breath sounds: Rales present.     Comments: Decreased breath sound at bases R>L Abdominal:     Palpations: Abdomen is soft.     Tenderness: There is no abdominal tenderness. There is no guarding.  Musculoskeletal:        General: No swelling.     Right lower leg: No edema.     Left lower leg: No edema.    Neurological:     General: No focal deficit present.     Mental Status: He is alert.  Psychiatric:        Mood and Affect: Mood normal.        Behavior: Behavior normal.        Thought Content: Thought content normal.        Judgment: Judgment normal.    EKG: personally reviewed my interpretation is afib with nl heart rate  CXR: personally reviewed my interpretation is rt opacity  Assessment & Plan by Problem: Active Problems:   UTI (urinary tract infection)   PNA (pneumonia)   Acute hypoxemic respiratory failure (HCC)   Hypokalemia  84 y.o. with PMHx of A. Fib, HFpEF (EF 50-55%), history of pleural effusion s/p thoracentesis x2, who presented w chills, frequency, fatigue, shortness of breath, found to have UTI, and bilateral pulm infiltration. Admited for UTI, possible PNA and pleural effusion/pulm edema.  Complicated UTI:  Patient has some systemic symptoms-reported chills and can be categorized as complicated UTI. Patient has 1 day history of urinary frequency. UA with bacteria, RBC, leukocyte, nitrate. UC pending. Started ceftriaxone in ED.  -Continue ceftriaxone IV 1g daily (10/7>) -Follow-up urine culture  Acute hypoxic respiratory failure Likely multifactorial, 2/2 PNA and pulmonary edema in setting of HFpEF Mild-moderate R side pleural effusion Patient was tachypnea hypoxic to 88% on room air on arrival. Improved with 2 li of [place]. Now at 0-2 li and saturating normal. CXR with R>L opacity at bases and some rt side pleural effusion, some pulmonary congestion, BNP moderately elevated at 395), also has history of prior pleural effusion and underwent. Hx of recurrent pleural effusion s/p prior thora,- transudate and assumed to be 2/2 HF. He does not want another thora.  CAP vs aspiration PNA Pulmonary congestion-edema 2/2 HF  Will treat for PNA and some diuresis.  -Continue Ceftriaxon and Azithromycin IV for PNA. -S/p lasix 60 mg IV once -Reassess tomorrow for diuretic  requirement -Strict intake output -Weight daily -Continue home HF meds: metoprolol and diltiazem -Repeat CXR in few days after Tx to reassess the pleural effusion -Pt has mild AKI. Monitor kidney function when diuresing  Hypokalemia: K 2.7 on arrival. Received IV and p.o. potassium supplement in ED.  -Checking mag and repeating BMP this p.m. - Replacing as needed  Chronic problems: Afib: No RVR -Continue home Eliquis, Dilt and metoprol  Probable COPD? Hx of heavy smoking. Started on COPD Tx on prior hospitalization and recommended PFT. -Will continue home inhalers  Code: Full (needs to be discussed tomorrow again because patient was not willing to talk about that) VTE ppx: Eliquis IVF: none Diet: Heart healthy  Dispo: Admit patient to Inpatient with  expected length of stay greater than 2 midnights.  SignedChevis Pretty, MD 06/02/2020, 4:02 PM  Pager: [phone] After 5pm on weekdays and 1pm on weekends: On Call pager: [phone]

## 2020-06-02 NOTE — ED Notes (Signed)
Gave pt turkey sandwich and sprite 

## 2020-06-02 NOTE — ED Provider Notes (Addendum)
[facility] EMERGENCY DEPARTMENT Provider Note   CSN: [id number] Arrival date & time: 06/02/20  0805     History Chief Complaint  Patient presents with  . Chills    Alfred Mercado is a 84 y.o. male with past medical history significant for heart failure with preserved EF 50%, A. fib with RVR, COPD, recurrent pleural effusion who presents for evaluation of chills.  Began yesterday.  Denies pain.  Fell ike he is warm however has not taken temp. Lives in hotel. States he has chronic shortness of breath which is worsened.  No known Covid exposures.  He has been vaccinated against COVID. Denies headache, lightheadedness, dizziness, chest pain, hemoptysis, abdominal pain, diarrhea, dysuria, rashes, lesions, lower extremity edema, unilateral leg swelling, redness, warmth. Denies additional aggravating or alleviating factors. He is compliant his medications. He is chronically anticoagulated on Eliquis. Denies cough however actively coughing during exam. Has urinary frequency however also on lasix. No hematuria or dysuria.  History obtained from patient and past medical records. No interpreter is used.   HPI     Past Medical History:  Diagnosis Date  . Acute heart failure with preserved ejection fraction (HCC)   . Atrial fibrillation with rapid ventricular response (HCC) 07/11/2019    Patient Active Problem List   Diagnosis Date Noted  . Acute diastolic CHF (congestive heart failure) (HCC) 02/06/2020  . Exertional shortness of breath 10/14/2019  . Atrial fibrillation with RVR (HCC) 10/14/2019  . Pleural effusion   . Chronic heart failure with preserved ejection fraction [facility])     Past Surgical History:  Procedure Laterality Date  . APPENDECTOMY         Family History  Problem Relation Age of Onset  . Aneurysm Father     Social History   Tobacco Use  . Smoking status: Light Tobacco Smoker  . Smokeless tobacco: Never Used  Substance Use Topics  .  Alcohol use: Not Currently  . Drug use: Never    Home Medications Prior to Admission medications   Medication Sig Start Date End Date Taking? Authorizing Provider  acetaminophen (TYLENOL) 325 MG tablet Take 650 mg by mouth every 6 (six) hours as needed for mild pain or headache.   Yes [provider]  albuterol (VENTOLIN HFA) 108 (90 Base) MCG/ACT inhaler Inhale 2 puffs into the lungs every 6 (six) hours as needed for wheezing or shortness of breath. 02/25/20  Yes Newlin, Odette Horns, MD  diltiazem (CARDIZEM CD) 180 MG 24 hr capsule TAKE 1 CAPSULE(180 MG) BY MOUTH DAILY Patient taking differently: Take 180 mg by mouth daily.  05/27/20  Yes Newlin, Enobong, MD  ELIQUIS 5 MG TABS tablet TAKE 1 TABLET BY MOUTH TWICE DAILY Patient taking differently: Take 5 mg by mouth 2 (two) times daily.  05/27/20  Yes Newlin, Odette Horns, MD  furosemide (LASIX) 40 MG tablet TAKE 1 TABLET BY MOUTH DAILY. IF YOUR WEIGHT GOES UP BY 2-3 POUNDS IN A DAY OR 5 POUNDS IN A WEEK, TAKE 1 EXTRA TABLET OF LASIX Patient taking differently: Take 40 mg by mouth See admin instructions.  05/27/20  Yes Hoy Register, MD  metoprolol tartrate (LOPRESSOR) 25 MG tablet TAKE 1 TABLET(25 MG) BY MOUTH TWICE DAILY Patient taking differently: Take 25 mg by mouth 2 (two) times daily.  05/27/20  Yes Newlin, Odette Horns, MD  polyethylene glycol powder (GLYCOLAX/MIRALAX) 17 GM/SCOOP powder Take 17 g by mouth daily. 03/02/20  Yes Hoy Register, MD  Vitamin D, Ergocalciferol, (DRISDOL) 1.25 MG (50000 UNIT)  CAPS capsule TAKE 1 CAPSULE BY MOUTH 1 TIME A WEEK Patient taking differently: Take 50,000 Units by mouth every 7 (seven) days. Usually Wednesday 05/27/20  Yes Newlin, Odette Horns, MD  nicotine (NICODERM CQ - DOSED IN MG/24 HOURS) 21 mg/24hr patch Place 1 patch (21 mg total) onto the skin daily. 10/19/19   Liborio Nixon, MD  Tiotropium Bromide Monohydrate (SPIRIVA RESPIMAT) 2.5 MCG/ACT AERS Inhale 2 puffs into the lungs daily. 05/30/20   Hoy Register,  MD    Allergies    Patient has no known allergies.  Review of Systems   Review of Systems  Constitutional: Positive for chills. Negative for activity change, appetite change, diaphoresis, fatigue, fever and unexpected weight change.  HENT: Negative.   Respiratory: Positive for shortness of breath (Chronic, intermittent). Negative for apnea, cough, choking, chest tightness, wheezing and stridor.   Cardiovascular: Negative.   Gastrointestinal: Negative.   Genitourinary: Negative.   Musculoskeletal: Negative.   Skin: Negative.   Neurological: Negative.   All other systems reviewed and are negative.   Physical Exam Updated Vital Signs BP 110/66   Pulse 95   Temp 98.8 F (37.1 C) (Oral)   Resp (!) 28   SpO2 91%   Physical Exam Vitals and nursing note reviewed.  Constitutional:      General: He is not in acute distress.    Appearance: He is well-developed. He is not ill-appearing, toxic-appearing or diaphoretic.  HENT:     Head: Normocephalic and atraumatic.     Nose: Nose normal.     Mouth/Throat:     Mouth: Mucous membranes are moist.  Eyes:     Pupils: Pupils are equal, round, and reactive to light.  Cardiovascular:     Rate and Rhythm: Normal rate and regular rhythm.     Pulses: Normal pulses.     Heart sounds: Normal heart sounds.  Pulmonary:     Effort: No respiratory distress.     Comments: Mild bilateral crackles at Bl lung bases. Decreased breath sounds on RIGHT. Speaks in full sentences however tachypnic Abdominal:     General: Bowel sounds are normal. There is no distension.     Palpations: Abdomen is soft. There is no mass.     Tenderness: There is no abdominal tenderness. There is no right CVA tenderness, guarding or rebound.     Hernia: No hernia is present.  Musculoskeletal:        General: No swelling, tenderness, deformity or signs of injury. Normal range of motion.     Cervical back: Normal range of motion and neck supple.     Comments: Moves all 4  extremities without difficulty.  Compartments soft.  No edema  Skin:    General: Skin is warm and dry.     Capillary Refill: Capillary refill takes less than 2 seconds.  Neurological:     General: No focal deficit present.     Mental Status: He is alert and oriented to person, place, and time.     Cranial Nerves: Cranial nerves are intact.     Sensory: Sensation is intact.     Motor: Motor function is intact.     Gait: Gait is intact.     Comments: Hard of hearing CN 2-12 grossly intact Ambulatory from bed to bedside commode without difficulty     ED Results / Procedures / Treatments   Labs (all labs ordered are listed, but only abnormal results are displayed) Labs Reviewed  CBC WITH DIFFERENTIAL/PLATELET - Abnormal; Notable  for the following components:      Result Value   WBC 11.8 (*)    RBC 3.80 (*)    Hemoglobin 11.0 (*)    HCT 33.3 (*)    Neutro Abs 10.3 (*)    Lymphs Abs 0.6 (*)    All other components within normal limits  BASIC METABOLIC PANEL - Abnormal; Notable for the following components:   Potassium 2.7 (*)    Glucose, Bld 136 (*)    Creatinine, Ser 1.37 (*)    Calcium 8.2 (*)    GFR calc non Af Amer 48 (*)    All other components within normal limits  BRAIN NATRIURETIC PEPTIDE - Abnormal; Notable for the following components:   B Natriuretic Peptide 395.7 (*)    All other components within normal limits  URINALYSIS, ROUTINE W REFLEX MICROSCOPIC - Abnormal; Notable for the following components:   Color, Urine AMBER (*)    APPearance CLOUDY (*)    Hgb urine dipstick LARGE (*)    Protein, ur 30 (*)    Nitrite POSITIVE (*)    Leukocytes,Ua LARGE (*)    RBC / HPF >50 (*)    WBC, UA >50 (*)    Bacteria, UA MANY (*)    Non Squamous Epithelial 0-5 (*)    All other components within normal limits  RESPIRATORY PANEL BY RT PCR (FLU A&B, COVID)  URINE CULTURE    EKG EKG Interpretation  Date/Time:  Thursday June 02 2020 08:22:04 EDT Ventricular Rate:   99 PR Interval:    QRS Duration: 99 QT Interval:  370 QTC Calculation: 475 R Axis:   -59 Text Interpretation: Atrial fibrillation Left anterior fascicular block No significant change since last tracing Confirmed by Margarita Grizzle [phone]) on 06/02/2020 11:40:14 AM   Radiology DG Chest Portable 1 View  Result Date: 06/02/2020 CLINICAL DATA:  Chills shortness of breath EXAM: PORTABLE CHEST 1 VIEW COMPARISON:  February 06, 2020, February seventeenth 2021 FINDINGS: The cardiomediastinal silhouette is unchanged and enlarged in contour.Tortuous thoracic aorta. Moderate RIGHT pleural effusion, similar in comparison to prior. Incomplete visualization of the LEFT costophrenic angle. No pneumothorax. Increased reticulation and basilar interstitial markings bilaterally. RIGHT basilar heterogeneous opacification of the RIGHT middle and lower lobes, similar in comparison to prior. Visualized abdomen is unremarkable. Multilevel degenerative changes of the thoracic spine. IMPRESSION: 1. Moderate RIGHT pleural effusion with RIGHT basilar heterogeneous opacification, similar in comparison to prior. Differential considerations include atelectasis, recurring aspiration or infection. 2. Background of diffuse bibasilar interstitial prominence likely reflects underlying pulmonary edema. Atypical infection could present similarly. Electronically Signed   By: Meda Klinefelter MD   On: 06/02/2020 09:02    Procedures Procedures (including critical care time)  Medications Ordered in ED Medications  potassium chloride 10 mEq in 100 mL IVPB (0 mEq Intravenous Stopped 06/02/20 1153)  cefTRIAXone (ROCEPHIN) 1 g in sodium chloride 0.9 % 100 mL IVPB (0 g Intravenous Stopped 06/02/20 1039)  potassium chloride SA (KLOR-CON) CR tablet 40 mEq (40 mEq Oral Given 06/02/20 1050)  azithromycin (ZITHROMAX) 500 mg in sodium chloride 0.9 % 250 mL IVPB (500 mg Intravenous New Bag/Given 06/02/20 1151)  furosemide (LASIX) injection 60 mg (60 mg  Intravenous Given 06/02/20 1152)    ED Course  I have reviewed the triage vital signs and the nursing notes.  Pertinent labs & imaging results that were available during my care of the patient were reviewed by me and considered in my medical decision making (see chart for details).  84 year old presents for evaluation of chills. Began yesterday. He is afebrile, nonseptic, non-ill appearing. Heart clear. Does have decreased breath sounds on the right. History of recurrent right pleural effusion. His abdomen is soft, nontender. No urinary complaints. No gross rashes or lesions. Does not look grossly fluid overloaded to his lower extremities. Plan on labs, imaging and reassess.  Labs and imaging personally reviewed and interpreted:  CBC leukocytosis at 11.8 BMP BNP UA positive for infection, Culture sent, no prior culture to compare. Given Rocephin DG chest recurrent pleural effusion, possible infiltrates, possible pulmonary edema. EKG No STEMI, Afib, chronic COVID/Flu negative  0925: Patient reassessed. Denies pain or current SOB. States his Sob is with exertion which is what he has had previously. Laying at 5845' in bed without hypoxia, does have mild tachypnea. Discussed recurrent moderate pleural effusion. Patient states "I will never let them drain fluid again." Labs pending.  1205: Patient reassessed. Noted to be hypoxic to 87% on RA. Placed on 2 L [place]. Discussed admission. Patient agreeable however continues to decline thoracentesis.   CONSULT with IM teaching resident who agrees to evaluate patient for admission.  Patient to be admitted for acute hypoxic respiratory failure likely multifactorial in etiology. Coverd for CAP with azithromycin and Rocephin given patient already received Rocephin for his UTI. Also had some Lasix given pulmonary edema. Patient does not appear septic at this time. Hemodynamically stable at this time.  Hypokalemia supplemented with IV and p.o. potassium  The  patient appears reasonably stabilized for admission considering the current resources, flow, and capabilities available in the ED at this time, and I doubt [facility] further screening and/or treatment in the ED prior to admission.  Discussed with attending Dr. Rosalia Hammersay who is in agreement with above treatment, plan and disposition.    MDM Rules/Calculators/A&P                          Burnadette PopCurtis Jamerson was evaluated in Emergency Department on 06/02/2020 for the symptoms described in the history of present illness. He was evaluated in the context of the global COVID-19 pandemic, which necessitated consideration that the patient might be at risk for infection with the SARS-CoV-2 virus that causes COVID-19. Institutional protocols and algorithms that pertain to the evaluation of patients at risk for COVID-19 are in a state of rapid change based on information released by regulatory bodies including the CDC and federal and state organizations. These policies and algorithms were followed during the patient's care in the ED. Final Clinical Impression(s) / ED Diagnoses Final diagnoses:  Community acquired pneumonia, unspecified laterality  Acute cystitis with hematuria  Pleural effusion  Acute respiratory failure with hypoxia (HCC)  Hypokalemia  Elevated brain natriuretic peptide (BNP) level    Rx / DC Orders ED Discharge Orders    None        Esmay Amspacher A, PA-C 06/02/20 1300    Goldia Ligman A, PA-C 06/02/20 1505    Margarita Grizzleay, Danielle, MD 06/03/20 1124

## 2020-06-02 NOTE — ED Notes (Addendum)
Nurse ambulated patient a short distance, oxygen saturation did not fall below 92% on RA. Patient appeared moderately short of breath and reported difficultly breathing.

## 2020-06-02 NOTE — Hospital Course (Addendum)
[name] is a 84 y.o. with PMHx of A. Fib, HFpEF (EF 50-55%), history of pleural effusion s/p thoracentesis x2, and COPD who presented with 1 to 2 days of chills, increased urinary frequency, fatigue. Admited for UTI, possible PNA, heart failure exacerbation and pleural effusion/pulm edema.   Acute hypoxic respiratory failure 2/2 to pulmonary edema in setting of HFpEF Patient was tachypneic and hypoxic to 88% on room air on arrival. He was placed on 2L [place] which allowed his O2 sat to normalize. CXR 06/02/20 showed R>L opacity at bases and a right side pleural effusion, some pulmonary congestion, (BNP moderately elevated at 395). This is likely more so due to pulmonary congestion, right pleural effusion, and his heart failure versus a infectious process.  -Continue with IV Lasix 80 mg twice daily. Lung sounds improved through his hospital course, likely due to decreased pleural effusion 2/2 diuresis. -Yesterday he was net -0.8 L, he is net -2.7 L for his total hospital stay -Continue home HF meds: metoprolol 25mg  twice daily -Patient had mild AKI on admission with creatinine of 1.44 and BUN of 14, he is now back to his baseline with creatinine around 1.2 -Follow-up outpatient with his PCP for routine labs  Complicated UTI:  Patient reported a 1 day history of urinary frequency, chills, fatigue prior to admission. Initial urinalysis on 06/02/20 showed bacteria, increased RBC, increased leukocytes, positive nitrites. Urine culture grew Enterobacter cloacae resistant to cefazolin. Started nitrofurantoin 100 mg twice daily. Repeat Urinalysis 06/04/20 was postiive for RBC, leukocytes, Hgb, will reassess when patient completes antibiotic course.   -Continue nitrofurantoin 100 mg twice daily, today is day three of seven, with an end date of 06/10/2020 -Follow-up with PCP outpatient for consideration of urology referral  History of Afib: Patient has a history of atrial fibrillation which has been well  controlled with diltiazem 180 mg every 24 hours, he is anticoagulated with Eliquis 5 mg daily at home. EKGs obtained 06/02/20 and 06/03/20 shows atrial fibrillation without RVR -Continue home Eliquis 5mg  qD, metoprolol 25mg  BID and diltiazem 180 mg q24h   Probable COPD Patient has a long history of heavy smoking.08/03/20 He was started on COPD treatment in a prior hospitalization and was recommended to pursue PFT, which has not been obtained. -Will continue home inhalers Ellipta and albuterol  Hypokalemia: (Resolved) Patient presented with a potassium of 2.7 on arrival. This was repleted with Potassium chloride 40 M EQ twice daily -Repeat potassium levels showed normalization, 4.1 today  Hypomagnesemia (Resolved) -Magnesium on admission was 1.7. This was repleted once with 5 g magnesium sulfate -Magnesium 2.3 on 06/04/20

## 2020-06-02 NOTE — ED Triage Notes (Signed)
Pt arrives to ED with complaints of chills starting yesterday. Denies fevers and pain. States his chronic SOB has also worsened. Has been vaccinated.

## 2020-06-03 LAB — GLUCOSE, CAPILLARY: Glucose-Capillary: 114 mg/dL — ABNORMAL HIGH (ref 70–99)

## 2020-06-03 LAB — COMPREHENSIVE METABOLIC PANEL
ALT: 12 U/L (ref 0–44)
AST: 14 U/L — ABNORMAL LOW (ref 15–41)
Albumin: 2.6 g/dL — ABNORMAL LOW (ref 3.5–5.0)
Alkaline Phosphatase: 54 U/L (ref 38–126)
Anion gap: 9 (ref 5–15)
BUN: 18 mg/dL (ref 8–23)
CO2: 24 mmol/L (ref 22–32)
Calcium: 8.2 mg/dL — ABNORMAL LOW (ref 8.9–10.3)
Chloride: 101 mmol/L (ref 98–111)
Creatinine, Ser: 1.26 mg/dL — ABNORMAL HIGH (ref 0.61–1.24)
GFR calc non Af Amer: 53 mL/min — ABNORMAL LOW (ref >60)
Glucose, Bld: 137 mg/dL — ABNORMAL HIGH (ref 70–99)
Potassium: 3.6 mmol/L (ref 3.5–5.1)
Sodium: 134 mmol/L — ABNORMAL LOW (ref 135–145)
Total Bilirubin: 1.5 mg/dL — ABNORMAL HIGH (ref 0.3–1.2)
Total Protein: 5.2 g/dL — ABNORMAL LOW (ref 6.5–8.1)

## 2020-06-03 LAB — CBC
HCT: 31.2 % — ABNORMAL LOW (ref 39.0–52.0)
Hemoglobin: 10.7 g/dL — ABNORMAL LOW (ref 13.0–17.0)
MCH: 30.1 pg (ref 26.0–34.0)
MCHC: 34.3 g/dL (ref 30.0–36.0)
MCV: 87.6 fL (ref 80.0–100.0)
Platelets: 163 10*3/uL (ref 150–400)
RBC: 3.56 MIL/uL — ABNORMAL LOW (ref 4.22–5.81)
RDW: 13.2 % (ref 11.5–15.5)
WBC: 12.1 10*3/uL — ABNORMAL HIGH (ref 4.0–10.5)
nRBC: 0 % (ref 0.0–0.2)

## 2020-06-03 MED ORDER — FUROSEMIDE 10 MG/ML IJ SOLN
80.0000 mg | Freq: Two times a day (BID) | INTRAMUSCULAR | Status: DC
  Administered 2020-06-04 – 2020-06-06 (×5): 80 mg via INTRAVENOUS
  Filled 2020-06-03 (×5): qty 8

## 2020-06-03 MED ORDER — FUROSEMIDE 10 MG/ML IJ SOLN
80.0000 mg | Freq: Every day | INTRAMUSCULAR | Status: DC
  Administered 2020-06-03: 80 mg via INTRAVENOUS
  Filled 2020-06-03: qty 8

## 2020-06-03 MED ORDER — POTASSIUM CHLORIDE 10 MEQ/100ML IV SOLN
INTRAVENOUS | Status: AC
  Filled 2020-06-03: qty 100

## 2020-06-03 NOTE — Progress Notes (Signed)
Subjective:  [name] was evaluated at bedside this morning. There were no acute events overnight. He reports he is overall doing well. He endorses intermittent shortness of breath which was present prior to coming in the hospital, but he reports "this is nothing right home about". Denies chest pain, fevers, has not had any chills in the hospital. He is moving his bowels well, denies dysuria, hematuria, hematochezia. He does not report any significant swelling in the legs over the past few weeks.  Objective:  Vital signs in last 24 hours: Vitals:   06/03/20 0011 06/03/20 0307 06/03/20 0821 06/03/20 1239  BP: (!) 106/49 104/76 127/77 100/68  Pulse: 82 89 84 69  Resp: 18 18 18 18   Temp: 98 F (36.7 C) 98.6 F (37 C) 98.2 F (36.8 C) 97.8 F (36.6 C)  TempSrc:  Oral Oral Oral  SpO2: 99%  99% 99%  Weight:  86.5 kg    Height:       Weight change:   Intake/Output Summary (Last 24 hours) at 06/03/2020 1531 Last data filed at 06/03/2020 1303 Gross per 24 hour  Intake 820 ml  Output 902 ml  Net -82 ml   PHYSICAL EXAMINATION:  General Appearance: The patient is well-developed, well-nourished, in no acute distress.  HEENT: Conjunctivae are pink and moist. Sclerae are white and nonicteric.  Mouth: Oral mucosa is pink and moist. Dentition is good.  Lungs: Mild crackles heard in the upper and middle lobes posteriorly, decreased lung sounds in the right lower lobe posteriorly Heart: Irregular rate and rhythm, regular S1/S2. No murmurs are noted.  Abdomen: Distended and nontender, good bowel sounds, no rebound or guarding.   Skin: There are no rashes, lesions or ulcers noted. Warm and dry with good turgor.  MSK: 0-1+ pitting edema in the bilateral lower extremities Neuro: Cranial nerves II through XII are grossly intact.   Psych: Patient is pleasant and cooperative   Assessment/Plan:  Active Problems:   UTI (urinary tract infection)   PNA (pneumonia)   Acute hypoxemic  respiratory failure (HCC)   Hypokalemia   Mr. 08/03/2020 is a 84 y.o. with PMHx of A. Fib, HFpEF (EF 50-55%), history of pleural effusion s/p thoracentesis x2, and COPD who presented with 1 to 2 days of chills, increased urinary frequency, fatigue. Admited for UTI, possible PNA, heart failure exacerbation and pleural effusion/pulm edema.   Complicated UTI:  Patient has reported a 1 day history of urinary frequency, chills, fatigue. UA with bacteria, RBC, leukocyte, nitrate. UC pending. Started ceftriaxone in ED.   -Continue ceftriaxone IV 1g daily (10/7>) -Urine culture grew greater than 100,000 CFU gram-negative rods, follow-up for cultures and sensitivities   Acute hypoxic respiratory failure Likely multifactorial, 2/2 PNA and pulmonary edema in setting of HFpEF Mild-moderate R side pleural effusion Patient was tachypneic and hypoxic to 88% on room air on arrival. He is satting well on 2L [place]. CXR with R>L opacity at bases and some rt side pleural effusion, some pulmonary congestion, BNP moderately elevated at 395), also has history of prior pleural effusion and underwent prior thoracentesis x2. His second thoracentesis was more painful and he is opposed to the idea of another thoracentesis.  This is likely more so due to pulmonary congestion, right pleural effusion, heart failure than it is a pneumonia. Will treat for PNA and some diuresis.   -Continue Ceftriaxone for UTI coverage as well as CAP, and Azithromycin IV for atypical PNA. -lasix 60 mg IV once 06/02/20 -IV lasix 80mg   given today -Strict intake output -Weight daily -Continue home HF meds: metoprolol 25mg  twice daily and diltiazem 180 mg every 24 H -Repeat CXR in few days after Tx to reassess the pleural effusion -Pt has mild AKI. Monitor kidney function when diuresing   Hypokalemia: Potassium 2.7 on arrival. Received IV and p.o. potassium supplement in ED. -Potassium today improved to 3.6 - Replacing as  needed  Hypomagnesemia -Magnesium on admission was 1.7. This was repleted.  -Will recheck tomorrow   History of Afib: Patient has a history of atrial fibrillation which has been well controlled with diltiazem 180 mg every 24 hours, he is anticoagulated with Eliquis 5 mg daily at home. -EKG obtained in the ED shows atrial fibrillation without RVR -Continue home Eliquis 5mg  qD, metoprolol 25mg  BID and diltiazem 180 mg q24h   Probable COPD Hx of heavy smoking. Started on COPD Tx on prior hospitalization and recommended PFT. -Will continue home inhalers Ellipta and albuterol   LOS: 1 day   , Medical Student 06/03/2020, 3:31 PM

## 2020-06-03 NOTE — Evaluation (Addendum)
Occupational Therapy Evaluation and Discharge Patient Details Name: Alfred Mercado MRN: [id number] DOB: Nov 14, 1935 Today's Date: 06/03/2020    History of Present Illness Pt is an 84 year old man admitted with shortness of breath and fatigue, likely multifactoral from PNA, pulmonary edema and mild to moderate R side pleural effusion. Pt also + for UTI. PMH: CHF (EF, 50-55%), afib, pleural effusions with thoracentesis x 2, tobacco abuse.    Clinical Impression   Pt lives in a motel with other residents helping him get groceries or for transportation. He walks with a cane and sits to shower. Pt presents with fatigue and dyspnea, but demonstrated Sp02 between 94% (with exertion) to 100% at rest on RA. Reviewed energy conservation strategies with pt, he reports he already employs techniques out of necessity. He has no interest in stopping smoking.  Pt is eager to go home. No further OT needs.    Follow Up Recommendations  No OT follow up    Equipment Recommendations  Motorized wheelchair    Recommendations for Other Services       Precautions / Restrictions Precautions Precautions: Fall Precaution Comments: denies falls at home      Mobility Bed Mobility Overal bed mobility: Independent                Transfers Overall transfer level: Modified independent Equipment used: Straight cane             General transfer comment: slow to rise    Balance Overall balance assessment: Mild deficits observed, not formally tested                                         ADL either performed or assessed with clinical judgement   ADL Overall ADL's : Modified independent                                       General ADL Comments: pt ambulating with a cane about his room with 02 sats >94%     Vision Baseline Vision/History: Wears glasses Patient Visual Report: No change from baseline       Perception     Praxis      Pertinent  Vitals/Pain Pain Assessment: No/denies pain     Hand Dominance Right   Extremity/Trunk Assessment Upper Extremity Assessment Upper Extremity Assessment: Overall WFL for tasks assessed   Lower Extremity Assessment Lower Extremity Assessment: Defer to PT evaluation   Cervical / Trunk Assessment Cervical / Trunk Assessment: Kyphotic   Communication Communication Communication: HOH   Cognition Arousal/Alertness: Awake/alert Behavior During Therapy: WFL for tasks assessed/performed Overall Cognitive Status: Within Functional Limits for tasks assessed                                     General Comments       Exercises     Shoulder Instructions      Home Living Family/patient expects to be discharged to:: Other (Comment) (motel) Living Arrangements: Alone Available Help at Discharge: Friend(s);Available PRN/intermittently Type of Home: Other(Comment) (motel) Home Access: Level entry           Bathroom Shower/Tub: Chief Strategy Officer: Handicapped height     Home Equipment: Cane - single  point;Shower seat         Prior Functioning/Environment Level of Independence: Independent with assistive device(s)        Comments: ambulates with use of a cane PRN, other residents of motel get his groceries and take him to dr appointments        OT Problem List:        OT Treatment/Interventions:      OT Goals(Current goals can be found in the care plan section) Acute Rehab OT Goals Patient Stated Goal: to go home today  OT Frequency:     Barriers to D/C:            Co-evaluation              AM-PAC OT "6 Clicks" Daily Activity     Outcome Measure Help from another person eating meals?: None Help from another person taking care of personal grooming?: None Help from another person toileting, which includes using toliet, bedpan, or urinal?: None Help from another person bathing (including washing, rinsing, drying)?:  None Help from another person to put on and taking off regular upper body clothing?: None Help from another person to put on and taking off regular lower body clothing?: None 6 Click Score: 24   End of Session    Activity Tolerance: Patient limited by fatigue (pt sleepy, wanting to return to bed at end of session ) Patient left: in bed;with call bell/phone within reach  OT Visit Diagnosis: Other (comment) (decreased activity tolerance)                Time: 1004-1020 OT Time Calculation (min): 16 min Charges:  OT General Charges $OT Visit: 1 Visit OT Evaluation $OT Eval Low Complexity: 1 Low  Martie Round, OTR/L Acute Rehabilitation Services Pager: [phone] Office: [phone]  Evern Bio 06/03/2020, 10:34 AM

## 2020-06-03 NOTE — Plan of Care (Signed)
  Problem: Nutrition: Goal: Adequate nutrition will be maintained Outcome: Completed/Met   Problem: Pain Managment: Goal: General experience of comfort will improve Outcome: Completed/Met

## 2020-06-03 NOTE — TOC Transition Note (Addendum)
Transition of Care [facility]) - CM/SW Discharge Note   Patient Details  Name: Alfred Mercado MRN: [id number] Date of Birth: [birth year]  [facility]) CM/SW Contact:  Leone Haven, RN Phone Number: 06/03/2020, 5:33 PM   Clinical Narrative:    NCM spoke with patient at bedside, he is [facility], offered choice , he states [name] is ok with him.  NCM made referral to [facility] with [facility] for HHPT.  He is able to take referral .  Soc will begin 24 to 48 hrs post dc.  Patient states he lives in the [place] at [address] [place] phone  [phone] ext [phone]. He states he has someone that will take him to his appts and somone to help him get to grocery store which is not far at all from him.  Need order for HHPT.   Final next level of care: Home w Home Health Services Barriers to Discharge: Continued Medical Work up   Patient Goals and CMS Choice Patient states their goals for this hospitalization and ongoing recovery are:: get better CMS Medicare.gov Compare Post Acute Care list provided to:: Patient Choice offered to / list presented to : Patient  Discharge Placement                       Discharge Plan and Services                  DME Agency: NA       HH Arranged: PT HH Agency: [facility] Date [facility] Agency Contacted: 06/03/20 Time HH Agency Contacted: [phone] Representative spoke with at [facility] Agency: Kandee Keen  Social Determinants of Health (SDOH) Interventions     Readmission Risk Interventions No flowsheet data found.

## 2020-06-03 NOTE — Evaluation (Signed)
Physical Therapy Evaluation Patient Details Name: Alfred Mercado MRN: [id number] DOB: 27-Feb-1936 Today's Date: 06/03/2020   History of Present Illness  Pt is an 84 year old man admitted with shortness of breath and fatigue, likely multifactoral from PNA, pulmonary edema and mild to moderate R side pleural effusion. Pt also + for UTI. PMH: CHF (EF, 50-55%), afib, pleural effusions with thoracentesis x 2, tobacco abuse.   Clinical Impression  Pt admitted with above diagnosis. Pt was able to ambulate with cane with overall good stability and appears to be close to baseline.  Pt feels he functions well with cane and does not want a RW.  Pt does want a motorized scooter and would benefit as he needs it for community mobility.  MD - please order if you agree. Pt currently with functional limitations due to the deficits listed below (see PT Problem List). Pt will benefit from skilled PT to increase their independence and safety with mobility to allow discharge to the venue listed below.      Follow Up Recommendations Home health PT;Supervision - Intermittent    Equipment Recommendations  Other (comment) (Motorized scooter)    Recommendations for Other Services       Precautions / Restrictions Precautions Precautions: Fall Precaution Comments: denies falls at home Restrictions Weight Bearing Restrictions: No      Mobility  Bed Mobility Overal bed mobility: Independent                Transfers Overall transfer level: Modified independent Equipment used: Straight cane             General transfer comment: slow to rise  Ambulation/Gait Ambulation/Gait assistance: Min guard;Supervision Gait Distance (Feet): 40 Feet Assistive device: Straight cane Gait Pattern/deviations: Step-through pattern;Decreased stride length;Trunk flexed;Wide base of support   Gait velocity interpretation: <1.31 ft/sec, indicative of household ambulator General Gait Details: Pt appears to be at  baseline per pt and no LOB with fair safety.  Feel that pt does well in controlled environement with cane.  Did suggest RW for incr safety and pt refuses.   Stairs            Wheelchair Mobility    Modified Rankin (Stroke Patients Only)       Balance Overall balance assessment: Mild deficits observed, not formally tested                                           Pertinent Vitals/Pain Pain Assessment: No/denies pain    Home Living Family/patient expects to be discharged to:: Other (Comment) (motel) Living Arrangements: Alone Available Help at Discharge: Friend(s);Available PRN/intermittently Type of Home: Other(Comment) (motel) Home Access: Level entry   Entrance Stairs-Number of Steps: 1 Home Layout: One level Home Equipment: Cane - single point;Shower seat      Prior Function Level of Independence: Independent with assistive device(s)         Comments: ambulates with use of a cane PRN, other residents of motel get his groceries and take him to dr appointments     Hand Dominance   Dominant Hand: Right    Extremity/Trunk Assessment   Upper Extremity Assessment Upper Extremity Assessment: Defer to OT evaluation    Lower Extremity Assessment Lower Extremity Assessment: Generalized weakness    Cervical / Trunk Assessment Cervical / Trunk Assessment: Kyphotic  Communication   Communication: HOH  Cognition Arousal/Alertness: Awake/alert Behavior During  Therapy: WFL for tasks assessed/performed Overall Cognitive Status: Within Functional Limits for tasks assessed                                        General Comments General comments (skin integrity, edema, etc.): Pt sats 87% and > on RA with PT pulse ox but with dynamap the sat was 94% and greater.     Exercises General Exercises - Lower Extremity Long Arc Quad: AROM;Both;10 reps;Seated   Assessment/Plan    PT Assessment Patient needs continued PT services  PT  Problem List Decreased activity tolerance;Decreased balance;Decreased mobility;Decreased knowledge of use of DME;Decreased safety awareness;Decreased knowledge of precautions;Cardiopulmonary status limiting activity       PT Treatment Interventions DME instruction;Gait training;Functional mobility training;Therapeutic activities;Therapeutic exercise;Balance training;Patient/family education    PT Goals (Current goals can be found in the Care Plan section)  Acute Rehab PT Goals Patient Stated Goal: to go home today PT Goal Formulation: With patient Time For Goal Achievement: 06/17/20 Potential to Achieve Goals: Good    Frequency Min 3X/week   Barriers to discharge Decreased caregiver support      Co-evaluation               AM-PAC PT "6 Clicks" Mobility  Outcome Measure Help needed turning from your back to your side while in a flat bed without using bedrails?: None Help needed moving from lying on your back to sitting on the side of a flat bed without using bedrails?: None Help needed moving to and from a bed to a chair (including a wheelchair)?: None Help needed standing up from a chair using your arms (e.g., wheelchair or bedside chair)?: A Little Help needed to walk in hospital room?: A Little Help needed climbing 3-5 steps with a railing? : A Little 6 Click Score: 21    End of Session Equipment Utilized During Treatment: Gait belt Activity Tolerance: Patient tolerated treatment well Patient left: in chair;with call bell/phone within reach;with chair alarm set Nurse Communication: Mobility status PT Visit Diagnosis: Muscle weakness (generalized) (M62.81)    Time: 1610-9604 PT Time Calculation (min) (ACUTE ONLY): 21 min   Charges:   PT Evaluation $PT Eval Moderate Complexity: 1 Mod          Genowefa Morga W,PT Acute Rehabilitation Services Pager:  [phone]  Office:  [phone]    Berline Lopes 06/03/2020, 1:56 PM

## 2020-06-03 NOTE — Plan of Care (Signed)
  Problem: Health Behavior/Discharge Planning: Goal: Ability to manage health-related needs will improve Outcome: Progressing   Problem: Clinical Measurements: Goal: Ability to maintain clinical measurements within normal limits will improve Outcome: Progressing   Problem: Clinical Measurements: Goal: Will remain free from infection Outcome: Progressing   Problem: Health Behavior/Discharge Planning: Goal: Ability to manage health-related needs will improve Outcome: Progressing   Problem: Education: Goal: Knowledge of General Education information will improve Description: Including pain rating scale, medication(s)/side effects and non-pharmacologic comfort measures Outcome: Progressing

## 2020-06-04 DIAGNOSIS — J9601 Acute respiratory failure with hypoxia: Secondary | ICD-10-CM

## 2020-06-04 DIAGNOSIS — Z7901 Long term (current) use of anticoagulants: Secondary | ICD-10-CM

## 2020-06-04 DIAGNOSIS — E876 Hypokalemia: Secondary | ICD-10-CM

## 2020-06-04 DIAGNOSIS — I110 Hypertensive heart disease with heart failure: Secondary | ICD-10-CM

## 2020-06-04 DIAGNOSIS — N390 Urinary tract infection, site not specified: Secondary | ICD-10-CM

## 2020-06-04 DIAGNOSIS — J189 Pneumonia, unspecified organism: Secondary | ICD-10-CM

## 2020-06-04 DIAGNOSIS — J440 Chronic obstructive pulmonary disease with acute lower respiratory infection: Secondary | ICD-10-CM

## 2020-06-04 DIAGNOSIS — I5030 Unspecified diastolic (congestive) heart failure: Secondary | ICD-10-CM

## 2020-06-04 DIAGNOSIS — I4891 Unspecified atrial fibrillation: Secondary | ICD-10-CM

## 2020-06-04 LAB — URINALYSIS, COMPLETE (UACMP) WITH MICROSCOPIC
Bilirubin Urine: NEGATIVE
Glucose, UA: NEGATIVE mg/dL
Ketones, ur: NEGATIVE mg/dL
Nitrite: NEGATIVE
Protein, ur: NEGATIVE mg/dL
RBC / HPF: >50 RBC/hpf — ABNORMAL HIGH (ref 0–5)
Specific Gravity, Urine: 1.006 (ref 1.005–1.030)
pH: 7 (ref 5.0–8.0)

## 2020-06-04 LAB — URINE CULTURE: Culture: >=100000 — AB

## 2020-06-04 LAB — CBC
HCT: 29.6 % — ABNORMAL LOW (ref 39.0–52.0)
Hemoglobin: 10 g/dL — ABNORMAL LOW (ref 13.0–17.0)
MCH: 30 pg (ref 26.0–34.0)
MCHC: 33.8 g/dL (ref 30.0–36.0)
MCV: 88.9 fL (ref 80.0–100.0)
Platelets: 170 10*3/uL (ref 150–400)
RBC: 3.33 MIL/uL — ABNORMAL LOW (ref 4.22–5.81)
RDW: 13.2 % (ref 11.5–15.5)
WBC: 8.7 10*3/uL (ref 4.0–10.5)
nRBC: 0 % (ref 0.0–0.2)

## 2020-06-04 LAB — BASIC METABOLIC PANEL
Anion gap: 10 (ref 5–15)
BUN: 20 mg/dL (ref 8–23)
CO2: 25 mmol/L (ref 22–32)
Calcium: 7.9 mg/dL — ABNORMAL LOW (ref 8.9–10.3)
Chloride: 101 mmol/L (ref 98–111)
Creatinine, Ser: 1.28 mg/dL — ABNORMAL HIGH (ref 0.61–1.24)
GFR, Estimated: 52 mL/min — ABNORMAL LOW (ref >60)
Glucose, Bld: 95 mg/dL (ref 70–99)
Potassium: 3.2 mmol/L — ABNORMAL LOW (ref 3.5–5.1)
Sodium: 136 mmol/L (ref 135–145)

## 2020-06-04 LAB — GLUCOSE, CAPILLARY: Glucose-Capillary: 91 mg/dL (ref 70–99)

## 2020-06-04 LAB — MAGNESIUM: Magnesium: 2.3 mg/dL (ref 1.7–2.4)

## 2020-06-04 MED ORDER — NITROFURANTOIN MONOHYD MACRO 100 MG PO CAPS
100.0000 mg | ORAL_CAPSULE | Freq: Two times a day (BID) | ORAL | Status: DC
  Administered 2020-06-04 – 2020-06-06 (×5): 100 mg via ORAL
  Filled 2020-06-04 (×6): qty 1

## 2020-06-04 MED ORDER — POTASSIUM CHLORIDE CRYS ER 20 MEQ PO TBCR
40.0000 meq | EXTENDED_RELEASE_TABLET | Freq: Two times a day (BID) | ORAL | Status: DC
  Administered 2020-06-04 – 2020-06-06 (×5): 40 meq via ORAL
  Filled 2020-06-04 (×5): qty 2

## 2020-06-04 NOTE — Progress Notes (Addendum)
Subjective:  Alfred Mercado was evaluated at bedside this morning. There were no acute events overnight. He reports he is overall doing well. He appears more agitated today, and wishes to go home. He reports some intermittent shortness of breath, which is about the same as yesterday. He denies any fevers, chills, abdominal pain the past 24 hours.   Dark brown-red urine is filling the canister 75% in the room today.   Objective:  Vital signs in last 24 hours: Vitals:   06/03/20 2234 06/03/20 2238 06/04/20 0402 06/04/20 0500  BP: 109/63 109/71 107/74   Pulse: 95 95 75   Resp: 20  16   Temp: 98.1 F (36.7 C)  98.4 F (36.9 C)   TempSrc: Oral  Oral   SpO2: 97%  98%   Weight:    85.1 kg  Height:       Weight change: 0.096 kg  Intake/Output Summary (Last 24 hours) at 06/04/2020 1115 Last data filed at 06/04/2020 0409 Gross per 24 hour  Intake 350 ml  Output 1600 ml  Net -1250 ml   PHYSICAL EXAMINATION:  General Appearance: The patient is well-developed, well-nourished, in no acute distress.  HEENT: Conjunctivae are pink and moist. Sclerae are white and nonicteric.  Mouth: Oral mucosa is pink and moist. Dentition is good.  Lungs: Mild crackles heard in the upper and middle lobes posteriorly, decreased lung sounds in the right lower lobe posteriorly Heart: Irregular rate and rhythm, regular S1/S2. No murmurs are noted.  Abdomen: Distended and nontender, good bowel sounds, no rebound or guarding.   Skin: There are no rashes, lesions or ulcers noted. Warm and dry with good turgor.  MSK: 0-1+ pitting edema in the bilateral lower extremities Neuro: Cranial nerves II through XII are grossly intact.   Psych: Patient is cooperative but agitated today   Assessment/Plan:  Active Problems:   UTI (urinary tract infection)   PNA (pneumonia)   Acute hypoxemic respiratory failure (HCC)   Hypokalemia   Alfred Mercado is a 84 y.o. with PMHx of A. Fib, HFpEF (EF 50-55%), history of pleural  effusion s/p thoracentesis x2, and COPD who presented with 1 to 2 days of chills, increased urinary frequency, fatigue. Admited for UTI, possible PNA, heart failure exacerbation and pleural effusion/pulm edema.   Complicated UTI:  Patient has reported a 1 day history of urinary frequency, chills, fatigue. UA with bacteria, RBC, leukocyte, nitrate. UC pending. Started ceftriaxone in ED.   -Got 3rd dose of ceftriaxone IV 1g today -Urine culture grew greater than 100,000 CFU Enterobacter cloacae, resistant to cefazolin. Ceftriaxone was not tested. Spoke with pharmacy and they recommended nitrofurantoin 100 mg twice daily based on Creatinine clearance and bacterial sensitivities.  -Discontinued ceftriaxone -Started nitrofurantoin 100 mg twice daily  -Urine in canister in the room today was dark brown-red. U/A ordered, follow up.   Acute hypoxic respiratory failure Likely multifactorial, 2/2 PNA and pulmonary edema in setting of HFpEF Mild-moderate R side pleural effusion Patient was tachypneic and hypoxic to 88% on room air on arrival. He is satting well on 2L [place]. CXR with R>L opacity at bases and some rt side pleural effusion, some pulmonary congestion, BNP moderately elevated at 395), also has history of prior pleural effusion and underwent prior thoracentesis x2. His second thoracentesis was more painful and he is opposed to the idea of another thoracentesis.  This is likely more so due to pulmonary congestion, right pleural effusion, heart failure than it is a pneumonia.   -We  will DC ceftriaxone today. -lasix 60 mg IV once 06/02/20 -Continue with IV Lasix 80 mg twice daily -Strict intake output -Weight daily -Continue home HF meds: metoprolol 25mg  twice daily -Repeat CXR in few days after Tx to reassess the pleural effusion -Pt has mild AKI. Monitor kidney function when diuresing -Daily BMP and CBC   Hypokalemia: Potassium 2.7 on arrival. Received IV and p.o. potassium supplement in  ED. -Potassium today improved to 3.2 -Continue potassium chloride 40 M EQ twice daily p.o.  Hypomagnesemia -Magnesium on admission was 1.7. This was repleted.  -Magnesium 2.3 on 06/04/20   History of Afib: Patient has a history of atrial fibrillation which has been well controlled with diltiazem 180 mg every 24 hours, he is anticoagulated with Eliquis 5 mg daily at home. -EKG obtained in the ED shows atrial fibrillation without RVR -Continue home Eliquis 5mg  qD, metoprolol 25mg  BID and diltiazem 180 mg q24h   Probable COPD Hx of heavy smoking. Started on COPD Tx on prior hospitalization and recommended PFT. -Will continue home inhalers Ellipta and albuterol   LOS: 2 days   08/04/20, Medical Student 06/04/2020, 11:15 AM

## 2020-06-04 NOTE — Plan of Care (Signed)

## 2020-06-05 LAB — BASIC METABOLIC PANEL
Anion gap: 9 (ref 5–15)
BUN: 17 mg/dL (ref 8–23)
CO2: 27 mmol/L (ref 22–32)
Calcium: 7.9 mg/dL — ABNORMAL LOW (ref 8.9–10.3)
Chloride: 101 mmol/L (ref 98–111)
Creatinine, Ser: 1.25 mg/dL — ABNORMAL HIGH (ref 0.61–1.24)
GFR, Estimated: 53 mL/min — ABNORMAL LOW (ref >60)
Glucose, Bld: 98 mg/dL (ref 70–99)
Potassium: 3.5 mmol/L (ref 3.5–5.1)
Sodium: 137 mmol/L (ref 135–145)

## 2020-06-05 LAB — CBC
HCT: 30.2 % — ABNORMAL LOW (ref 39.0–52.0)
Hemoglobin: 10.1 g/dL — ABNORMAL LOW (ref 13.0–17.0)
MCH: 29.6 pg (ref 26.0–34.0)
MCHC: 33.4 g/dL (ref 30.0–36.0)
MCV: 88.6 fL (ref 80.0–100.0)
Platelets: 216 10*3/uL (ref 150–400)
RBC: 3.41 MIL/uL — ABNORMAL LOW (ref 4.22–5.81)
RDW: 13 % (ref 11.5–15.5)
WBC: 6.6 10*3/uL (ref 4.0–10.5)
nRBC: 0 % (ref 0.0–0.2)

## 2020-06-05 LAB — GLUCOSE, CAPILLARY: Glucose-Capillary: 102 mg/dL — ABNORMAL HIGH (ref 70–99)

## 2020-06-05 MED ORDER — NICOTINE 7 MG/24HR TD PT24
7.0000 mg | MEDICATED_PATCH | Freq: Every day | TRANSDERMAL | Status: DC
  Administered 2020-06-05 – 2020-06-06 (×2): 7 mg via TRANSDERMAL
  Filled 2020-06-05 (×2): qty 1

## 2020-06-05 NOTE — Progress Notes (Signed)
Subjective: O/N Events: None  Alfred Mercado was seen and evaluated at bedside this AM. He states that he is doing well this AM. He has no complaints at this time. We discussed continued diureses. He is agreeable to the plan. He did gave the IMTS team sage advice that we only have one life, so party and enjoy it as much as you can. We appreciate his recommendations. All questions and concerns were addressed at bedside.    Objective:  Vital signs in last 24 hours: Vitals:   06/04/20 1622 06/04/20 2017 06/05/20 0138 06/05/20 0406  BP: 114/68 (!) 101/51  114/69  Pulse: 74 100  87  Resp: 20 18  17   Temp: 98.5 F (36.9 C) 98.5 F (36.9 C)  97.7 F (36.5 C)  TempSrc: Oral Oral  Oral  SpO2: 99% 99%  100%  Weight:   84.3 kg   Height:       Weight change: -0.822 kg  Intake/Output Summary (Last 24 hours) at 06/05/2020 0623 Last data filed at 06/05/2020 08/05/2020 Gross per 24 hour  Intake 1540 ml  Output 2650 ml  Net -1110 ml   Physical Exam Constitutional:      Appearance: Normal appearance.  Cardiovascular:     Rate and Rhythm: Normal rate and regular rhythm.     Pulses: Normal pulses.     Heart sounds: Normal heart sounds. No murmur heard.  No friction rub. No gallop.   Pulmonary:     Effort: Pulmonary effort is normal. No respiratory distress.     Breath sounds: No wheezing, rhonchi or rales.     Comments: Bibasilar crackles appreciated on examination.  Abdominal:     General: Abdomen is flat. Bowel sounds are normal.     Tenderness: There is no abdominal tenderness.  Musculoskeletal:        General: No tenderness.     Right lower leg: No edema.     Left lower leg: No edema.  Neurological:     Mental Status: He is alert and oriented to person, place, and time.      Assessment/Plan:  Active Problems:   UTI (urinary tract infection)   PNA (pneumonia)   Acute hypoxemic respiratory failure (HCC)   Hypokalemia   Mr. [id number] is a 84 y.o. with PMHx of A. Fib, HFpEF  (EF 50-55%), history of pleural effusion s/p thoracentesis x2, and COPD who presented with 1 to 2 days of chills, increased urinary frequency, fatigue. Admited for UTI, possible PNA, heart failure exacerbation and pleural effusion/pulm edema.   Acute hypoxic respiratory failure 2/2 to pulmonary edema in setting of HFpEF Patient was tachypneic and hypoxic to 88% on room air on arrival. He is satting well on 2L [place]. CXR with R>L opacity at bases and some rt side pleural effusion, some pulmonary congestion, BNP moderately elevated at 395). Likely more so due to pulmonary congestion, right pleural effusion, and his heart failure. Down 1.1L ON.  -Continue with IV Lasix 80 mg twice daily -Strict intake output -Weight daily -Continue home HF meds: metoprolol 25mg  twice daily -Pt has mild AKI. Monitor kidney function when diuresing -Daily BMP and CBC  Complicated UTI:  Patient has reported a 1 day history of urinary frequency, chills, fatigue. UA with bacteria, RBC, leukocyte, nitrate. UC showing Enterobacter cloacae resistant to cefazolin. Started nitrofurantoin. U/A + for RBC and Hgb on, will reassess when patient completes antibiotic course. May need Uro consult in the outpatient setting.  - Nitrofurantoin 100 mg twice  daily   History of Afib: Patient has a history of atrial fibrillation which has been well controlled with diltiazem 180 mg every 24 hours, he is anticoagulated with Eliquis 5 mg daily at home. -EKG obtained in the ED shows atrial fibrillation without RVR -Continue home Eliquis 5mg  qD, metoprolol 25mg  BID and diltiazem 180 mg q24h   Probable COPD Hx of heavy smoking. Started on COPD Tx on prior hospitalization and recommended PFT. -Will continue home inhalers Ellipta and albuterol  Hypokalemia: (Resolved) Potassium 2.7 on arrival.  -K: 3.5 -Continue potassium chloride 40 M EQ twice daily  Hypomagnesemia (Resolved) -Magnesium on admission was 1.7. This was repleted.  -Magnesium  2.3 on 06/04/20   LOS: 3 days   , MD IMTS, PGY-2 Pager: [phone] 06/05/2020,11:50 AM

## 2020-06-05 NOTE — Progress Notes (Signed)
Pt request to have nicotine patch, paged MD.

## 2020-06-05 NOTE — Progress Notes (Signed)
Pt frustrated that he cant have drinks, he says that he will pay for them if he needs to. Educated patient on FR, seems to be agreeable so far.

## 2020-06-06 ENCOUNTER — Other Ambulatory Visit (HOSPITAL_COMMUNITY): Payer: Self-pay | Admitting: Internal Medicine

## 2020-06-06 DIAGNOSIS — N390 Urinary tract infection, site not specified: Secondary | ICD-10-CM | POA: Diagnosis not present

## 2020-06-06 DIAGNOSIS — I5030 Unspecified diastolic (congestive) heart failure: Secondary | ICD-10-CM | POA: Diagnosis not present

## 2020-06-06 DIAGNOSIS — J9601 Acute respiratory failure with hypoxia: Secondary | ICD-10-CM | POA: Diagnosis not present

## 2020-06-06 DIAGNOSIS — J440 Chronic obstructive pulmonary disease with acute lower respiratory infection: Secondary | ICD-10-CM | POA: Diagnosis not present

## 2020-06-06 LAB — BASIC METABOLIC PANEL
Anion gap: 10 (ref 5–15)
BUN: 18 mg/dL (ref 8–23)
CO2: 27 mmol/L (ref 22–32)
Calcium: 8.7 mg/dL — ABNORMAL LOW (ref 8.9–10.3)
Chloride: 98 mmol/L (ref 98–111)
Creatinine, Ser: 1.3 mg/dL — ABNORMAL HIGH (ref 0.61–1.24)
GFR, Estimated: 51 mL/min — ABNORMAL LOW (ref >60)
Glucose, Bld: 136 mg/dL — ABNORMAL HIGH (ref 70–99)
Potassium: 4.1 mmol/L (ref 3.5–5.1)
Sodium: 135 mmol/L (ref 135–145)

## 2020-06-06 LAB — CBC
HCT: 35.6 % — ABNORMAL LOW (ref 39.0–52.0)
Hemoglobin: 11.7 g/dL — ABNORMAL LOW (ref 13.0–17.0)
MCH: 29 pg (ref 26.0–34.0)
MCHC: 32.9 g/dL (ref 30.0–36.0)
MCV: 88.1 fL (ref 80.0–100.0)
Platelets: 252 10*3/uL (ref 150–400)
RBC: 4.04 MIL/uL — ABNORMAL LOW (ref 4.22–5.81)
RDW: 12.6 % (ref 11.5–15.5)
WBC: 7.1 10*3/uL (ref 4.0–10.5)
nRBC: 0 % (ref 0.0–0.2)

## 2020-06-06 LAB — GLUCOSE, CAPILLARY: Glucose-Capillary: 106 mg/dL — ABNORMAL HIGH (ref 70–99)

## 2020-06-06 MED ORDER — NICOTINE 21 MG/24HR TD PT24: 21.0000 mg | MEDICATED_PATCH | TRANSDERMAL | 0 refills | Status: AC

## 2020-06-06 MED ORDER — NITROFURANTOIN MONOHYD MACRO 100 MG PO CAPS
100.0000 mg | ORAL_CAPSULE | Freq: Two times a day (BID) | ORAL | 0 refills | Status: DC
Start: 2020-06-06 — End: 2020-07-04

## 2020-06-06 MED ORDER — NITROFURANTOIN MONOHYD MACRO 100 MG PO CAPS
100.0000 mg | ORAL_CAPSULE | Freq: Two times a day (BID) | ORAL | 0 refills | Status: DC
Start: 2020-06-06 — End: 2020-06-06

## 2020-06-06 NOTE — Progress Notes (Signed)
D/C instructions given and reviewed. Asked about nicotine patches being sent to [facility] pharmacy, MD notified. IV removed, tolerated well.

## 2020-06-06 NOTE — TOC Transition Note (Signed)
Transition of Care [facility]) - CM/SW Discharge Note   Patient Details  Name: Alfred Mercado MRN: [id number] Date of Birth: Dec 01, 1935  [facility]) CM/SW Contact:  Leone Haven, RN Phone Number: 06/06/2020, 12:45 PM   Clinical Narrative:    Patient is for dc today, NCM notified Kandee Keen with [place].  Patient  States his phone will be [phone] room 204 to scheduled apts with HHPT. NCM informed Cory with [place].   10/8-NCM spoke with patient at bedside, he is [facility], offered choice , he states [name] is ok with him.  NCM made referral to [facility] with [facility] for HHPT.  He is able to take referral .  Soc will begin 24 to 48 hrs post dc.  Patient states he lives in the [place] at [address] [place] phone  [phone] ext [phone]. He states he has someone that will take him to his appts and somone to help him get to grocery store which is not far at all from him.  Need order for HHPT.   Final next level of care: Home w Home Health Services Barriers to Discharge: No Barriers Identified   Patient Goals and CMS Choice Patient states their goals for this hospitalization and ongoing recovery are:: get better CMS Medicare.gov Compare Post Acute Care list provided to:: Patient Choice offered to / list presented to : Patient  Discharge Placement                       Discharge Plan and Services                  DME Agency: NA       HH Arranged: PT HH Agency: [facility] Date [facility] Agency Contacted: 06/03/20 Time HH Agency Contacted: [phone] Representative spoke with at [facility] Agency: Kandee Keen  Social Determinants of Health (SDOH) Interventions Food Insecurity Interventions: Intervention Not Indicated Transportation Interventions: Intervention Not Indicated   Readmission Risk Interventions No flowsheet data found.

## 2020-06-06 NOTE — Progress Notes (Signed)
Subjective: O/N Events: None  [name] was seen and evaluated at bedside this AM. He states that he is doing well this AM. He is agitated in a versus answering questions. He wants to go home. He denies any chest pain, shortness of breath, fevers, chills, abdominal pain. He thinks that we are doing a great job as a team but is still very unhappy with his second painful thoracentesis.  Objective:  Vital signs in last 24 hours: Vitals:   06/06/20 0414 06/06/20 0516 06/06/20 0758 06/06/20 1217  BP: 106/71  102/60 98/63  Pulse: (!) 105  73 91  Resp: 18   17  Temp: 97.7 F (36.5 C)   98.4 F (36.9 C)  TempSrc: Oral   Oral  SpO2: 97%  (!) 9% 92%  Weight:  83.1 kg    Height:       Weight change: -1.134 kg  Intake/Output Summary (Last 24 hours) at 06/06/2020 1249 Last data filed at 06/06/2020 1225 Gross per 24 hour  Intake 840 ml  Output 2600 ml  Net -1760 ml   PHYSICAL EXAMINATION:  General Appearance: The patient is well-developed, well-nourished, in no acute distress.  HEENT: Conjunctivae are pink and moist. Sclerae are white and nonicteric.  Mouth: Oral mucosa is pink and moist. Dentition is good.  Lungs: Mild crackles heard in the upper and middle lobes posteriorly, improved lung sounds in right lower lobe posteriorly  heart: Irregular rate and rhythm, regular S1/S2. No murmurs are noted.  Abdomen: Distended and nontender, good bowel sounds, no rebound or guarding.   Skin: There are no rashes, lesions or ulcers noted. Warm and dry with good turgor.  MSK: 0-1+ pitting edema in the bilateral lower extremities Neuro: Cranial nerves II through XII are grossly intact.   Psych: Patient is cooperative but agitated today  Assessment/Plan:  Active Problems:   UTI (urinary tract infection)   PNA (pneumonia)   Acute hypoxemic respiratory failure (HCC)   Hypokalemia   [name] is a 84 y.o. with PMHx of A. Fib, HFpEF (EF 50-55%), history of pleural effusion s/p  thoracentesis x2, and COPD who presented with 1 to 2 days of chills, increased urinary frequency, fatigue. Admited for UTI, possible PNA, heart failure exacerbation and pleural effusion/pulm edema.   Acute hypoxic respiratory failure 2/2 to pulmonary edema in setting of HFpEF Patient was tachypneic and hypoxic to 88% on room air on arrival. He was placed on 2L [place] which allowed his O2 sat to normalize. CXR 06/02/20 showed R>L opacity at bases and a right side pleural effusion, some pulmonary congestion, (BNP moderately elevated at 395). This is likely more so due to pulmonary congestion, right pleural effusion, and his heart failure versus a infectious process.  -Continue with IV Lasix 80 mg twice daily. Lung sounds improved through his hospital course, likely due to decreased pleural effusion 2/2 diuresis. -Yesterday he was net -0.8 L, he is net -2.7 L for his total hospital stay -Continue home HF meds: metoprolol 25mg  twice daily -Patient had mild AKI on admission with creatinine of 1.44 and BUN of 14, he is now back to his baseline with creatinine around 1.2 -Follow-up outpatient with his PCP for routine labs  Complicated UTI:  Patient reported a 1 day history of urinary frequency, chills, fatigue prior to admission. Initial urinalysis on 06/02/20 showed bacteria, increased RBC, increased leukocytes, positive nitrites. Urine culture grew Enterobacter cloacae resistant to cefazolin. Started nitrofurantoin 100 mg twice daily. Repeat Urinalysis 06/04/20 was postiive  for RBC, leukocytes, Hgb, will reassess when patient completes antibiotic course.   -Continue nitrofurantoin 100 mg twice daily, today is day three of seven, with an end date of 06/10/2020 -Follow-up with PCP outpatient for consideration of urology referral  History of Afib: Patient has a history of atrial fibrillation which has been well controlled with diltiazem 180 mg every 24 hours, he is anticoagulated with Eliquis 5 mg daily at  home. -EKG obtained in the ED shows atrial fibrillation without RVR -Continue home Eliquis 5mg  qD, metoprolol 25mg  BID and diltiazem 180 mg q24h   Probable COPD Patient has a long history of heavy smoking. He was started on COPD treatment in a prior hospitalization and recommended PFT. -Will continue home inhalers Ellipta and albuterol  Hypokalemia: (Resolved) Patient presented with a potassium of 2.7 on arrival. This was repleted with Potassium chloride 40 M EQ twice daily -Repeat potassium levels showed normalization, 4.1 today  Hypomagnesemia (Resolved) -Magnesium on admission was 1.7. This was repleted once with 5 g magnesium sulfate -Magnesium 2.3 on 06/04/20    LOS: 4 days   Marland Kitchen, MD IMTS, PGY-2 Pager: [phone] 06/06/2020,12:49 PM

## 2020-06-06 NOTE — Progress Notes (Signed)
Physical Therapy Treatment Patient Details Name: Alfred Mercado MRN: [id number] DOB: 07-Jun-1936 Today's Date: 06/06/2020    History of Present Illness Pt is an 84 year old man admitted with shortness of breath and fatigue, likely multifactoral from PNA, pulmonary edema and mild to moderate R side pleural effusion. Pt also + for UTI. PMH: CHF (EF, 50-55%), afib, pleural effusions with thoracentesis x 2, tobacco abuse.     PT Comments    Pt reluctantly agreeable to ambulate with therapy. Pt limited in safety today by decreased knowledge of deficits and decreased safety awareness in presence of decreased strength, balance and endurance. Pt is independent for bed mobility and mod I for transfers. Pt insists on use of SPC despite also stating he feels weaker, since he spent the weekend in bed. Pt starts min guard however has 2x LoB requiring modA for steadying. Educated pt on need to get his RW out when he goes home. Pt reluctantly agreeable to suggestion. D/c plans remain appropriate. Pt scheduled to go home today.     Follow Up Recommendations  Home health PT;Supervision - Intermittent     Equipment Recommendations  Other (comment) (Motorized scooter)       Precautions / Restrictions Precautions Precautions: Fall Precaution Comments: denies falls at home Restrictions Weight Bearing Restrictions: No    Mobility  Bed Mobility Overal bed mobility: Independent                Transfers Overall transfer level: Modified independent Equipment used: Straight cane             General transfer comment: slow to rise  Ambulation/Gait Ambulation/Gait assistance: Min guard;Mod assist;Min assist Gait Distance (Feet): 60 Feet Assistive device: Straight cane Gait Pattern/deviations: Step-through pattern;Decreased stride length;Trunk flexed;Wide base of support Gait velocity: slowed    General Gait Details: Pt initially ambulates without assist however experiences 2x LoB requiring  modA for correction, pt reports he is weak from laying in bed all weekenc         Balance Overall balance assessment: Mild deficits observed, not formally tested                                          Cognition Arousal/Alertness: Awake/alert Behavior During Therapy: WFL for tasks assessed/performed Overall Cognitive Status: Within Functional Limits for tasks assessed                                 General Comments: decreased safety awareness         General Comments General comments (skin integrity, edema, etc.): VSS on RA      Pertinent Vitals/Pain Pain Assessment: No/denies pain           PT Goals (current goals can now be found in the care plan section) Acute Rehab PT Goals Patient Stated Goal: to go home today PT Goal Formulation: With patient Time For Goal Achievement: 06/17/20 Potential to Achieve Goals: Good Progress towards PT goals: Progressing toward goals    Frequency    Min 3X/week      PT Plan Current plan remains appropriate       AM-PAC PT "6 Clicks" Mobility   Outcome Measure  Help needed turning from your back to your side while in a flat bed without using bedrails?: None Help needed moving from lying on your  back to sitting on the side of a flat bed without using bedrails?: None Help needed moving to and from a bed to a chair (including a wheelchair)?: None Help needed standing up from a chair using your arms (e.g., wheelchair or bedside chair)?: A Little Help needed to walk in hospital room?: A Little Help needed climbing 3-5 steps with a railing? : A Little 6 Click Score: 21    End of Session Equipment Utilized During Treatment: Gait belt Activity Tolerance: Patient tolerated treatment well Patient left: with call bell/phone within reach;in bed Nurse Communication: Mobility status PT Visit Diagnosis: Muscle weakness (generalized) (M62.81)     Time: 2841-3244 PT Time Calculation (min) (ACUTE  ONLY): 15 min  Charges:  $Gait Training: 8-22 mins                     Alfred Mercado B. Beverely Risen PT, DPT Acute Rehabilitation Services Pager [phone] Office [phone]    Alfred Mercado 06/06/2020, 1:30 PM

## 2020-06-06 NOTE — Discharge Instructions (Signed)
  To [name],  It was a pleasure taking care of you during your stay at [facility]. You were admitted for your heart failure, and given Lasix to decrease the excess fluid. You were additionally found to have urinary tract infection. You have been prescribed an antibiotic, nitrofurantoin, please take one pill twice daily for 6 days.   Information on my medicine - ELIQUIS (apixaban)  This medication education was reviewed with me or my healthcare representative as part of my discharge preparation.  The pharmacist that spoke with me during my hospital stay was:  Ulyses Southward, RPH-CPP  Why was Eliquis prescribed for you? Eliquis was prescribed for you to reduce the risk of a blood clot forming that can cause a stroke if you have a medical condition called atrial fibrillation (a type of irregular heartbeat).  What do You need to know about Eliquis ? Take your Eliquis TWICE DAILY - one tablet in the morning and one tablet in the evening with or without food. If you have difficulty swallowing the tablet whole please discuss with your pharmacist how to take the medication safely.  Take Eliquis exactly as prescribed by your doctor and DO NOT stop taking Eliquis without talking to the doctor who prescribed the medication.  Stopping may increase your risk of developing a stroke.  Refill your prescription before you run out.  After discharge, you should have regular check-up appointments with your healthcare provider that is prescribing your Eliquis.  In the future your dose may need to be changed if your kidney function or weight changes by a significant amount or as you get older.  What do you do if you miss a dose? If you miss a dose, take it as soon as you remember on the same day and resume taking twice daily.  Do not take more than one dose of ELIQUIS at the same time to make up a missed dose.  Important Safety Information A possible side effect of Eliquis is bleeding. You should call your  healthcare provider right away if you experience any of the following: ? Bleeding from an injury or your nose that does not stop. ? Unusual colored urine (red or dark brown) or unusual colored stools (red or black). ? Unusual bruising for unknown reasons. ? A serious fall or if you hit your head (even if there is no bleeding).  Some medicines may interact with Eliquis and might increase your risk of bleeding or clotting while on Eliquis. To help avoid this, consult your healthcare provider or pharmacist prior to using any new prescription or non-prescription medications, including herbals, vitamins, non-steroidal anti-inflammatory drugs (NSAIDs) and supplements.  This website has more information on Eliquis (apixaban): http://www.eliquis.com/eliquis/home

## 2020-06-07 ENCOUNTER — Other Ambulatory Visit: Payer: Self-pay | Admitting: Family Medicine

## 2020-06-07 ENCOUNTER — Telehealth: Payer: Self-pay

## 2020-06-07 MED ORDER — NICOTINE 21 MG/24HR TD PT24: 21.0000 mg | MEDICATED_PATCH | Freq: Every day | TRANSDERMAL | 0 refills | Status: DC

## 2020-06-07 NOTE — Telephone Encounter (Signed)
Pt is calling to let dr Alvis Lemmings know he will be needing nicotine patch 21 mg/24 hrs patches in 30 days. Pt has an appt on 06/27/2020. [facility] [address]

## 2020-06-07 NOTE — Telephone Encounter (Signed)
Change of pharmacy Requested Prescriptions  Pending Prescriptions Disp Refills  . nicotine (NICODERM CQ - DOSED IN MG/24 HOURS) 21 mg/24hr patch 28 patch 0    Sig: Place 1 patch (21 mg total) onto the skin daily.     Psychiatry:  Drug Dependence Therapy Passed - 06/07/2020  9:31 AM      Passed - Valid encounter within last 12 months    Recent Outpatient Visits          1 week ago Atrial fibrillation with RVR (HCC)   [facility] [place], Odette Horns, MD   3 months ago Atrial fibrillation with RVR [facility])   [facility] Hoy Register, MD   5 months ago Pleural effusion, right   [facility] Storm Frisk, MD   6 months ago Atrial fibrillation with rapid ventricular response [facility])   [facility] Hoy Register, MD      Future Appointments            In 2 weeks Hoy Register, MD [facility]

## 2020-06-07 NOTE — Telephone Encounter (Signed)
Transition Care Management Follow-up Telephone Call  Date of discharge and from where: 06/06/2020, [facility]   How have you been since you were released from the hospital? He said he is feeling much better  Any questions or concerns? None verbalized  Items Reviewed:  Did the pt receive and understand the discharge instructions provided?  yes  Medications obtained and verified?  he said he has all of his medications and the nurse got it for him  and did not have any questions about his med regime.   Any new allergies since your discharge?  none reported   Do you have support at home?  he said he has help at home from the neighbors    home health or DME ordered PT at home [facility] agency contacted   Functional Questionnaire: (I = Independent and D = Dependent) ADLs: independent.  Has walker to use if needed  Follow up appointments reviewed:   PCP Hospital f/u appt confirmed?   appointment scheduled on 06/27/2020 at 2:30 with MD [facility] f/u appt confirmed?not at this time  Are transportation arrangements needed?  no  If their condition worsens, is the pt aware to call PCP or go to the Emergency Dept.? yes  Was the patient provided with contact information for the PCP's office or ED? He has the phone numbers for the clinic  Was to pt encouraged to call back with questions or concerns?  yes

## 2020-06-08 ENCOUNTER — Inpatient Hospital Stay (HOSPITAL_COMMUNITY): Admission: RE | Admit: 2020-06-08 | Payer: Medicare Other | Source: Ambulatory Visit

## 2020-06-09 ENCOUNTER — Telehealth: Payer: Self-pay | Admitting: Family Medicine

## 2020-06-09 NOTE — Telephone Encounter (Signed)
Copied from CRM [id number]. Topic: General - Other >> Jun 09, 2020 10:28 AM Elliot Gault wrote: Patient would like to speak with Royce Macadamia, RN as soon as possible, patient states he suppose to have a breathing test and would like to discuss.[phone].

## 2020-06-09 NOTE — Telephone Encounter (Signed)
Spoke w/ pt, he missed his PFT testing w/ [facility] yesterday but has rescheduled for 11/1 @ 9:00am, requested that hosp f/u w/ Dr. Alvis Lemmings be changed to AM appt due to transportation issues, pt moved to [facility] slot @ 10:50am.

## 2020-06-13 NOTE — Discharge Summary (Signed)
Name: Alfred Mercado MRN: [id number] DOB: [birth year] 84 y.o. PCP: Hoy Register, MD  Date of Admission: 06/02/2020  8:07 AM Date of Discharge: 06/06/2020 Attending Physician: Carlynn Purl MD Discharge Diagnosis: 1. Acute hypoxic respiratory failure, 2/2 to pulmonary edema in setting of HFpEF 2. Complicated UTI 3. History of Afib 4. Probable COPD  Discharge Medications: Allergies as of 06/06/2020   No Known Allergies     Medication List    TAKE these medications   acetaminophen 325 MG tablet Commonly known as: TYLENOL Take 650 mg by mouth every 6 (six) hours as needed for mild pain or headache.   albuterol 108 (90 Base) MCG/ACT inhaler Commonly known as: VENTOLIN HFA Inhale 2 puffs into the lungs every 6 (six) hours as needed for wheezing or shortness of breath.   diltiazem 180 MG 24 hr capsule Commonly known as: CARDIZEM CD TAKE 1 CAPSULE(180 MG) BY MOUTH DAILY What changed: See the new instructions.   Eliquis 5 MG Tabs tablet Generic drug: apixaban TAKE 1 TABLET BY MOUTH TWICE DAILY What changed: how much to take   furosemide 40 MG tablet Commonly known as: LASIX TAKE 1 TABLET BY MOUTH DAILY. IF YOUR WEIGHT GOES UP BY 2-3 POUNDS IN A DAY OR 5 POUNDS IN A WEEK, TAKE 1 EXTRA TABLET OF LASIX What changed: See the new instructions.   metoprolol tartrate 25 MG tablet Commonly known as: LOPRESSOR TAKE 1 TABLET(25 MG) BY MOUTH TWICE DAILY What changed: See the new instructions.   nicotine 21 mg/24hr patch Commonly known as: NICODERM CQ - dosed in mg/24 hours Place 1 patch (21 mg total) onto the skin daily.   nitrofurantoin (macrocrystal-monohydrate) 100 MG capsule Commonly known as: MACROBID Take 1 capsule (100 mg total) by mouth every 12 (twelve) hours.   polyethylene glycol powder 17 GM/SCOOP powder Commonly known as: GLYCOLAX/MIRALAX Take 17 g by mouth daily.   Spiriva Respimat 2.5 MCG/ACT Aers Generic drug: Tiotropium Bromide Monohydrate Inhale 2  puffs into the lungs daily.   Vitamin D (Ergocalciferol) 1.25 MG (50000 UNIT) Caps capsule Commonly known as: DRISDOL TAKE 1 CAPSULE BY MOUTH 1 TIME A WEEK What changed: See the new instructions.       Disposition and follow-up:   [name] was discharged from [facility] in Stable condition.  At the hospital follow up visit please address:  Acute hypoxic respiratory failure 2/2 to pulmonary edema in setting of HFpEF Continue with IV Lasix 80 mg twice daily. Lung sounds improved through his hospital course, likely due to decreased pleural effusion 2/2 diuresis. -Continue home HF meds: metoprolol 25mg  twice daily, lasix 40 mg daily, Cardizem 180 mg daily -Follow-up outpatient with his PCP for routine labs  2. Complicated UTI -Continue nitrofurantoin 100 mg twice daily with an end date of 06/10/2020 -Follow-up with PCP outpatient for consideration of urology referral  3. History of Afib -Continue home Eliquis 5mg  qD, metoprolol 25mg  BID and diltiazem 180 mg q24h  4. Probable COPD -Will continue home inhalers Ellipta and albuterol  2.  Labs / imaging needed at time of follow-up: U/A  3.  Pending labs/ test needing follow-up: N/A  Follow-up Appointments:  Follow-up Information    [facility] Follow up.   Specialty: Home Health Services Why: HHPT Contact information: [address] [place] [facility] [place]        [place], [place]. Go on 06/27/2020.   Specialty: Family Medicine Why: @2 :30pm Contact information: [address] [place]  [place] [address] [phone]               Acute hypoxic respiratory failure 2/2 to pulmonary edema in setting of HFpEF Patient was tachypneic and hypoxic to 88% on room air on arrival. He was placed on 2L [place] which allowed his O2 sat to normalize. CXR 06/02/20 showed R>L opacity at bases and a right side pleural effusion, some pulmonary congestion, (BNP moderately  elevated at 395). He was diuresed with 80 mg IV lasix twice daily. He was diuresed to a dry weight of 83.1. He was discharged on his home dose of Lasix.    Complicated UTI:  Patient reported a 1 day history of urinary frequency, chills, fatigue prior to admission. Initial urinalysis on 06/02/20 showed bacteria, increased RBC, increased leukocytes, positive nitrites. Urine culture grew Enterobacter cloacae resistant to cefazolin. Started nitrofurantoin 100 mg twice daily. Repeat Urinalysis 06/04/20 was postiive for RBC, leukocytes, Hgb, will reassess when patient completes antibiotic course in the outpatient setting.     History of Afib: Patient has a history of atrial fibrillation which has been well controlled with diltiazem 180 mg every 24 hours, he is anticoagulated with Eliquis 5 mg daily at home. EKGs obtained 06/02/20 and 06/03/20 shows atrial fibrillation without RVR. He was discharged in stable condition.     Probable COPD Patient with history of heavy smoking. He was started on COPD treatment in a prior hospitalization and was recommended to pursue PFTs in the outpatient setting that has not been performed. He was discharged in stable condition on his home inhalers of Ellipta and albuterol with instructions to follow up with his PCP for PFTs.   Discharge Vitals:   BP 98/63 (BP Location: Left Arm)   Pulse 91   Temp 98.4 F (36.9 C) (Oral)   Resp 17   Ht 6\' 2"  (1.88 m)   Wt 83.1 kg   SpO2 92%   BMI 23.53 kg/m   Pertinent Labs, Studies, and Procedures:   Ref Range & Units 13 d ago 8 mo ago 11 mo ago  B Natriuretic Peptide 0.0 - 100.0 pg/mL 395.7High  320.5High CM  300.3High CM     Ref Range & Units 13 d ago  Procalcitonin ng/mL 0.77      Discharge Instructions: Discharge Instructions    (HEART FAILURE PATIENTS) Call MD:  Anytime you have any of the following symptoms: 1) 3 pound weight gain in 24 hours or 5 pounds in 1 week 2) shortness of breath, with or without a dry  hacking cough 3) swelling in the hands, feet or stomach 4) if you have to sleep on extra pillows at night in order to breathe.   Complete by: As directed    Call MD for:  difficulty breathing, headache or visual disturbances   Complete by: As directed    Call MD for:  extreme fatigue   Complete by: As directed    Call MD for:  persistant dizziness or light-headedness   Complete by: As directed    Call MD for:  persistant nausea and vomiting   Complete by: As directed    Call MD for:  redness, tenderness, or signs of infection (pain, swelling, redness, odor or green/yellow discharge around incision site)   Complete by: As directed    Call MD for:  severe uncontrolled pain   Complete by: As directed    Call MD for:  temperature >100.4   Complete by: As directed    Diet - low  sodium heart healthy   Complete by: As directed    Increase activity slowly   Complete by: As directed       Signed: Dolan Amen, MD 06/13/2020, 2:24 PM   Pager: [phone]

## 2020-06-24 ENCOUNTER — Other Ambulatory Visit: Payer: Self-pay | Admitting: Family Medicine

## 2020-06-27 ENCOUNTER — Encounter: Payer: Self-pay | Admitting: Family Medicine

## 2020-06-27 ENCOUNTER — Other Ambulatory Visit: Payer: Self-pay

## 2020-06-27 ENCOUNTER — Inpatient Hospital Stay (HOSPITAL_COMMUNITY): Admission: RE | Admit: 2020-06-27 | Payer: Medicare Other | Source: Ambulatory Visit

## 2020-06-27 ENCOUNTER — Ambulatory Visit: Payer: Medicare Other | Attending: Family Medicine | Admitting: Family Medicine

## 2020-06-27 VITALS — BP 112/67 | HR 93 | Ht 74.0 in | Wt 181.0 lb

## 2020-06-27 DIAGNOSIS — I5032 Chronic diastolic (congestive) heart failure: Secondary | ICD-10-CM

## 2020-06-27 DIAGNOSIS — R42 Dizziness and giddiness: Secondary | ICD-10-CM | POA: Diagnosis not present

## 2020-06-27 DIAGNOSIS — Z7901 Long term (current) use of anticoagulants: Secondary | ICD-10-CM | POA: Insufficient documentation

## 2020-06-27 DIAGNOSIS — N3000 Acute cystitis without hematuria: Secondary | ICD-10-CM | POA: Diagnosis not present

## 2020-06-27 DIAGNOSIS — R0609 Other forms of dyspnea: Secondary | ICD-10-CM | POA: Insufficient documentation

## 2020-06-27 DIAGNOSIS — Z8249 Family history of ischemic heart disease and other diseases of the circulatory system: Secondary | ICD-10-CM | POA: Insufficient documentation

## 2020-06-27 DIAGNOSIS — Z79899 Other long term (current) drug therapy: Secondary | ICD-10-CM | POA: Insufficient documentation

## 2020-06-27 DIAGNOSIS — I4891 Unspecified atrial fibrillation: Secondary | ICD-10-CM | POA: Diagnosis present

## 2020-06-27 LAB — POCT URINALYSIS DIP (CLINITEK)
Bilirubin, UA: NEGATIVE
Glucose, UA: NEGATIVE mg/dL
Ketones, POC UA: NEGATIVE mg/dL
Nitrite, UA: POSITIVE — AB
POC PROTEIN,UA: 100 — AB
Spec Grav, UA: 1.02 (ref 1.010–1.025)
Urobilinogen, UA: 1 E.U./dL
pH, UA: 6 (ref 5.0–8.0)

## 2020-06-27 MED ORDER — DILTIAZEM HCL ER COATED BEADS 120 MG PO CP24: 120.0000 mg | ORAL_CAPSULE | Freq: Every day | ORAL | 6 refills | Status: DC

## 2020-06-27 NOTE — Progress Notes (Signed)
Get out of breath when he walks to much.

## 2020-06-27 NOTE — Progress Notes (Signed)
Subjective:  Patient ID: Alfred Mercado, male    DOB: [birth year]  Age: 84 y.o. MRN: [id number]  CC: Hospitalization Follow-up   HPI Alfred Mercado  is an 84 year old male with a history of atrial fibrillation with RVR, heart failure with preserved ejection fraction (EF 50 to 55%),history of pleural effusion status post thoracocentesis x2 here for follow-up visit. He had declined subsequent thoracocentesis and is not interested in any future thoracocentesis.   Hospitalized at [facility] from 06/02/2020 through 06/06/2020 for acute hypoxic respiratory failure secondary to pulmonary edema( after presenting with an oxygen saturation of 88% on room air), complicated UTI.  Chest x-ray had revealed: IMPRESSION: 1. Moderate RIGHT pleural effusion with RIGHT basilar heterogeneous opacification, similar in comparison to prior. Differential considerations include atelectasis, recurring aspiration or infection. 2. Background of diffuse bibasilar interstitial prominence likely reflects underlying pulmonary edema. Atypical infection could present similarly.  He required 2 L of oxygen, was treated with IV Lasix during hospital stay. Urinalysis was positive for nitrites and leukocyte esterase with urine culture positive for Enterobacter cloacae for which he was placed on nitrofurantoin  Sometimes has a dizzy spell and has to sit down. Endorses taking all his medications as prescribed. Denies presence of chills or fever which he had prior to hospitalization. He is dyspneic on exertion but not at rest. I had ordered a PFT for him to formally diagnose COPD and he has an upcoming appointment. Continues with Spiriva and albuterol MDI. He has cut back on smoking to 4 cig/day.  Past Medical History:  Diagnosis Date  . Acute heart failure with preserved ejection fraction (HCC)   . Atrial fibrillation with rapid ventricular response (HCC) 07/11/2019    Past Surgical History:  Procedure  Laterality Date  . APPENDECTOMY      Family History  Problem Relation Age of Onset  . Aneurysm Father     No Known Allergies  Outpatient Medications Prior to Visit  Medication Sig Dispense Refill  . acetaminophen (TYLENOL) 325 MG tablet Take 650 mg by mouth every 6 (six) hours as needed for mild pain or headache.    . albuterol (VENTOLIN HFA) 108 (90 Base) MCG/ACT inhaler Inhale 2 puffs into the lungs every 6 (six) hours as needed for wheezing or shortness of breath. 18 g 6  . ELIQUIS 5 MG TABS tablet TAKE 1 TABLET BY MOUTH TWICE DAILY (Patient taking differently: Take 5 mg by mouth 2 (two) times daily. ) 60 tablet 1  . furosemide (LASIX) 40 MG tablet TAKE 1 TABLET BY MOUTH DAILY. IF YOUR WEIGHT GOES UP BY 2-3 POUNDS IN A DAY OR 5 POUNDS IN A WEEK, TAKE 1 EXTRA TABLET OF LASIX (Patient taking differently: Take 40 mg by mouth See admin instructions. ) 45 tablet 2  . metoprolol tartrate (LOPRESSOR) 25 MG tablet TAKE 1 TABLET(25 MG) BY MOUTH TWICE DAILY (Patient taking differently: Take 25 mg by mouth 2 (two) times daily. ) 60 tablet 2  . nicotine (NICODERM CQ - DOSED IN MG/24 HOURS) 21 mg/24hr patch Place 1 patch (21 mg total) onto the skin daily. 30 patch 0  . nicotine (NICODERM CQ - DOSED IN MG/24 HOURS) 21 mg/24hr patch Place 1 patch (21 mg total) onto the skin daily. 28 patch 0  . polyethylene glycol powder (GLYCOLAX/MIRALAX) 17 GM/SCOOP powder Take 17 g by mouth daily. 3350 g 1  . Tiotropium Bromide Monohydrate (SPIRIVA RESPIMAT) 2.5 MCG/ACT AERS Inhale 2 puffs into the lungs daily. 30 each 3  .  Vitamin D, Ergocalciferol, (DRISDOL) 1.25 MG (50000 UNIT) CAPS capsule TAKE 1 CAPSULE BY MOUTH 1 TIME A WEEK (Patient taking differently: Take 50,000 Units by mouth every 7 (seven) days. Usually Wednesday) 12 capsule 0  . diltiazem (CARDIZEM CD) 180 MG 24 hr capsule TAKE 1 CAPSULE(180 MG) BY MOUTH DAILY (Patient taking differently: Take 180 mg by mouth daily. ) 30 capsule 2  . nitrofurantoin,  macrocrystal-monohydrate, (MACROBID) 100 MG capsule Take 1 capsule (100 mg total) by mouth every 12 (twelve) hours. (Patient not taking: Reported on 06/27/2020) 12 capsule 0   No facility-administered medications prior to visit.     ROS Review of Systems  Constitutional: Negative for activity change and appetite change.  HENT: Negative for sinus pressure and sore throat.   Eyes: Negative for visual disturbance.  Respiratory: Positive for shortness of breath (on exertion). Negative for cough and chest tightness.   Cardiovascular: Negative for chest pain and leg swelling.  Gastrointestinal: Negative for abdominal distention, abdominal pain, constipation and diarrhea.  Endocrine: Negative.   Genitourinary: Negative for dysuria.  Musculoskeletal: Negative for joint swelling and myalgias.  Skin: Negative for rash.  Allergic/Immunologic: Negative.   Neurological: Negative for weakness, light-headedness and numbness.  Psychiatric/Behavioral: Negative for dysphoric mood and suicidal ideas.    Objective:  BP 112/67   Pulse 93   Ht 6\' 2"  (1.88 m)   Wt 181 lb (82.1 kg)   SpO2 100%   BMI 23.24 kg/m   BP/Weight 06/27/2020 06/06/2020 05/30/2020  Systolic BP 112 98 114  Diastolic BP 67 63 60  Wt. (Lbs) 181 183.3 185.8  BMI 23.24 23.53 25.2      Physical Exam Constitutional:      Appearance: He is well-developed.  Neck:     Vascular: No JVD.  Cardiovascular:     Rate and Rhythm: Normal rate.     Heart sounds: Normal heart sounds. No murmur heard.   Pulmonary:     Effort: Pulmonary effort is normal.     Breath sounds: Normal breath sounds. No wheezing or rales.  Chest:     Chest wall: No tenderness.  Abdominal:     General: Bowel sounds are normal. There is no distension.     Palpations: Abdomen is soft. There is no mass.     Tenderness: There is no abdominal tenderness.  Musculoskeletal:        General: Normal range of motion.     Right lower leg: No edema.     Left lower  leg: No edema.  Neurological:     Mental Status: He is alert and oriented to person, place, and time.  Psychiatric:        Mood and Affect: Mood normal.     CMP Latest Ref Rng & Units 06/06/2020 06/05/2020 06/04/2020  Glucose 70 - 99 mg/dL 08/04/2020) 98 95  BUN 8 - 23 mg/dL 18 17 20   Creatinine 0.61 - 1.24 mg/dL 009(Q) ) 3.30(Q)  Sodium 135 - 145 mmol/L 135 137 136  Potassium 3.5 - 5.1 mmol/L 4.1 3.5 3.2(L)  Chloride 98 - 111 mmol/L 98 101 101  CO2 22 - 32 mmol/L 27 27 25   Calcium 8.9 - 10.3 mg/dL 7.62(U) 7.9(L) 7.9(L)  Total Protein 6.5 - 8.1 g/dL - - -  Total Bilirubin 0.3 - 1.2 mg/dL - - -  Alkaline Phos 38 - 126 U/L - - -  AST 15 - 41 U/L - - -  ALT 0 - 44 U/L - - -  Lipid Panel     Component Value Date/Time   CHOL 147 07/13/2019 0457   TRIG 77 07/13/2019 0457   HDL 38 (L) 07/13/2019 0457   CHOLHDL 3.9 07/13/2019 0457   VLDL 15 07/13/2019 0457   LDLCALC 94 07/13/2019 0457    CBC    Component Value Date/Time   WBC 7.1 06/06/2020 0742   RBC 4.04 (L) 06/06/2020 0742   HGB 11.7 (L) 06/06/2020 0742   HGB 13.4 01/07/2020 1014   HCT 35.6 (L) 06/06/2020 0742   HCT 37.6 01/07/2020 1014   PLT 252 06/06/2020 0742   PLT 284 01/07/2020 1014   MCV 88.1 06/06/2020 0742   MCV 87 01/07/2020 1014   MCH 29.0 06/06/2020 0742   MCHC 32.9 06/06/2020 0742   RDW 12.6 06/06/2020 0742   RDW 12.1 01/07/2020 1014   LYMPHSABS 0.6 (L) 06/02/2020 0853   LYMPHSABS 1.3 01/07/2020 1014   MONOABS 0.9 06/02/2020 0853   EOSABS 0.0 06/02/2020 0853   EOSABS 0.1 01/07/2020 1014   BASOSABS 0.0 06/02/2020 0853   BASOSABS 0.1 01/07/2020 1014    Lab Results  Component Value Date   HGBA1C 5.7 02/25/2020    Assessment & Plan:  1. Atrial fibrillation with rapid ventricular response (HCC) Stable Decreased dose of Cardizem due to complaints of dizziness - diltiazem (CARDIZEM CD) 120 MG 24 hr capsule; Take 1 capsule (120 mg total) by mouth daily.  Dispense: 30 capsule; Refill: 6  2.  Chronic heart failure with preserved ejection fraction (HCC) Euvolemic with EF of 50 to 55% Continue Lasix  3. Other form of dyspnea Suspected COPD Continue Spiriva and Proventil MDI Advised to follow through with his pulmonary function test Advised smoking cessation will be beneficial  4. Acute cystitis without hematuria UA in the clinic is positive He has completed a course of nitrofurantoin We will send of urine culture and treat and refer to urology if positive - Urine Culture - POCT URINALYSIS DIP (CLINITEK)  5. Dizziness Could be medication induced Reduced dose of antihypertensive    Meds ordered this encounter  Medications  . diltiazem (CARDIZEM CD) 120 MG 24 hr capsule    Sig: Take 1 capsule (120 mg total) by mouth daily.    Dispense:  30 capsule    Refill:  6    Dose decrease    Follow-up: Return in about 3 months (around 09/27/2020) for Chronic disease management.       Hoy Register, MD, FAAFP. [facility] [place], [place] [phone]   06/27/2020, 12:51 PM

## 2020-07-04 ENCOUNTER — Other Ambulatory Visit: Payer: Self-pay | Admitting: Family Medicine

## 2020-07-04 DIAGNOSIS — N3000 Acute cystitis without hematuria: Secondary | ICD-10-CM

## 2020-07-04 LAB — URINE CULTURE

## 2020-07-04 MED ORDER — NITROFURANTOIN MONOHYD MACRO 100 MG PO CAPS
100.0000 mg | ORAL_CAPSULE | Freq: Two times a day (BID) | ORAL | 0 refills | Status: DC
Start: 2020-07-04 — End: 2020-08-15

## 2020-07-08 ENCOUNTER — Telehealth: Payer: Self-pay

## 2020-07-08 NOTE — Telephone Encounter (Signed)
Call placed to patient and there is no voicemail set up to leave a message. 

## 2020-07-08 NOTE — Telephone Encounter (Signed)
-----   Message from Hoy Register, MD sent at 07/04/2020  4:56 PM EST ----- Urine reveals presence of UTI.  I have sent a prescription for antibiotics to his pharmacy.

## 2020-07-08 NOTE — Telephone Encounter (Signed)
-----   Message from Hoy Register, MD sent at 07/04/2020  4:56 PM EST ----- Please inform him I referred him to urology due to recurrent UTI.

## 2020-08-12 ENCOUNTER — Other Ambulatory Visit: Payer: Self-pay

## 2020-08-12 ENCOUNTER — Inpatient Hospital Stay (HOSPITAL_COMMUNITY)
Admission: EM | Admit: 2020-08-12 | Discharge: 2020-08-15 | DRG: 193 | Disposition: A | Discharge status: Home / Self Care | Payer: Medicare Other | Attending: Internal Medicine | Admitting: Internal Medicine

## 2020-08-12 DIAGNOSIS — R0602 Shortness of breath: Secondary | ICD-10-CM

## 2020-08-12 DIAGNOSIS — R002 Palpitations: Secondary | ICD-10-CM

## 2020-08-12 DIAGNOSIS — J189 Pneumonia, unspecified organism: Secondary | ICD-10-CM | POA: Diagnosis not present

## 2020-08-12 DIAGNOSIS — I5032 Chronic diastolic (congestive) heart failure: Secondary | ICD-10-CM | POA: Diagnosis present

## 2020-08-12 DIAGNOSIS — Z7901 Long term (current) use of anticoagulants: Secondary | ICD-10-CM

## 2020-08-12 DIAGNOSIS — Z79899 Other long term (current) drug therapy: Secondary | ICD-10-CM

## 2020-08-12 DIAGNOSIS — F17210 Nicotine dependence, cigarettes, uncomplicated: Secondary | ICD-10-CM | POA: Diagnosis present

## 2020-08-12 DIAGNOSIS — J440 Chronic obstructive pulmonary disease with acute lower respiratory infection: Secondary | ICD-10-CM | POA: Diagnosis present

## 2020-08-12 DIAGNOSIS — J441 Chronic obstructive pulmonary disease with (acute) exacerbation: Secondary | ICD-10-CM | POA: Diagnosis present

## 2020-08-12 DIAGNOSIS — J9601 Acute respiratory failure with hypoxia: Secondary | ICD-10-CM | POA: Diagnosis present

## 2020-08-12 DIAGNOSIS — I4819 Other persistent atrial fibrillation: Secondary | ICD-10-CM | POA: Diagnosis present

## 2020-08-12 DIAGNOSIS — Z20822 Contact with and (suspected) exposure to covid-19: Secondary | ICD-10-CM | POA: Diagnosis present

## 2020-08-12 DIAGNOSIS — K5909 Other constipation: Secondary | ICD-10-CM | POA: Diagnosis present

## 2020-08-12 DIAGNOSIS — Z8249 Family history of ischemic heart disease and other diseases of the circulatory system: Secondary | ICD-10-CM

## 2020-08-12 DIAGNOSIS — E559 Vitamin D deficiency, unspecified: Secondary | ICD-10-CM | POA: Diagnosis present

## 2020-08-12 LAB — BASIC METABOLIC PANEL
Anion gap: 13 (ref 5–15)
BUN: 12 mg/dL (ref 8–23)
CO2: 25 mmol/L (ref 22–32)
Calcium: 8.8 mg/dL — ABNORMAL LOW (ref 8.9–10.3)
Chloride: 101 mmol/L (ref 98–111)
Creatinine, Ser: 1.44 mg/dL — ABNORMAL HIGH (ref 0.61–1.24)
GFR, Estimated: 49 mL/min — ABNORMAL LOW (ref >60)
Glucose, Bld: 127 mg/dL — ABNORMAL HIGH (ref 70–99)
Potassium: 3.4 mmol/L — ABNORMAL LOW (ref 3.5–5.1)
Sodium: 139 mmol/L (ref 135–145)

## 2020-08-12 LAB — CBC
HCT: 33.7 % — ABNORMAL LOW (ref 39.0–52.0)
Hemoglobin: 11.3 g/dL — ABNORMAL LOW (ref 13.0–17.0)
MCH: 29.7 pg (ref 26.0–34.0)
MCHC: 33.5 g/dL (ref 30.0–36.0)
MCV: 88.7 fL (ref 80.0–100.0)
Platelets: 326 10*3/uL (ref 150–400)
RBC: 3.8 MIL/uL — ABNORMAL LOW (ref 4.22–5.81)
RDW: 14.4 % (ref 11.5–15.5)
WBC: 12.3 10*3/uL — ABNORMAL HIGH (ref 4.0–10.5)
nRBC: 0 % (ref 0.0–0.2)

## 2020-08-12 LAB — TROPONIN I (HIGH SENSITIVITY)
Troponin I (High Sensitivity): 7 ng/L (ref <18)
Troponin I (High Sensitivity): 9 ng/L (ref <18)

## 2020-08-12 NOTE — ED Triage Notes (Signed)
Pt present to ED POV. Pt c/o chest racing and feeling shakey. Pt states that s/s have resided now

## 2020-08-13 ENCOUNTER — Emergency Department (HOSPITAL_COMMUNITY): Payer: Medicare Other

## 2020-08-13 DIAGNOSIS — J9601 Acute respiratory failure with hypoxia: Secondary | ICD-10-CM | POA: Diagnosis present

## 2020-08-13 LAB — PROCALCITONIN: Procalcitonin: 0.15 ng/mL

## 2020-08-13 LAB — RESP PANEL BY RT-PCR (FLU A&B, COVID) ARPGX2
Influenza A by PCR: NEGATIVE
Influenza B by PCR: NEGATIVE
SARS Coronavirus 2 by RT PCR: NEGATIVE

## 2020-08-13 LAB — BRAIN NATRIURETIC PEPTIDE: B Natriuretic Peptide: 365.3 pg/mL — ABNORMAL HIGH (ref 0.0–100.0)

## 2020-08-13 MED ORDER — SODIUM CHLORIDE 0.9 % IV SOLN
1.0000 g | Freq: Once | INTRAVENOUS | Status: AC
  Administered 2020-08-13: 10:00:00 1 g via INTRAVENOUS
  Filled 2020-08-13: qty 10

## 2020-08-13 MED ORDER — SODIUM CHLORIDE 0.9% FLUSH
3.0000 mL | INTRAVENOUS | Status: DC | PRN
Start: 2020-08-13 — End: 2020-08-16

## 2020-08-13 MED ORDER — SODIUM CHLORIDE 0.9% FLUSH
3.0000 mL | Freq: Two times a day (BID) | INTRAVENOUS | Status: DC
  Administered 2020-08-14 – 2020-08-15 (×2): 3 mL via INTRAVENOUS

## 2020-08-13 MED ORDER — APIXABAN 5 MG PO TABS
5.0000 mg | ORAL_TABLET | Freq: Two times a day (BID) | ORAL | Status: DC
  Administered 2020-08-13 – 2020-08-15 (×4): 5 mg via ORAL
  Filled 2020-08-13 (×4): qty 1

## 2020-08-13 MED ORDER — DILTIAZEM HCL ER COATED BEADS 120 MG PO CP24
120.0000 mg | ORAL_CAPSULE | Freq: Every day | ORAL | Status: DC
  Administered 2020-08-13 – 2020-08-15 (×3): 120 mg via ORAL
  Filled 2020-08-13 (×3): qty 1

## 2020-08-13 MED ORDER — ENOXAPARIN SODIUM 40 MG/0.4ML ~~LOC~~ SOLN
40.0000 mg | SUBCUTANEOUS | Status: DC
Start: 2020-08-13 — End: 2020-08-13

## 2020-08-13 MED ORDER — ACETAMINOPHEN 325 MG PO TABS
650.0000 mg | ORAL_TABLET | Freq: Four times a day (QID) | ORAL | Status: DC | PRN
  Administered 2020-08-13 – 2020-08-15 (×3): 650 mg via ORAL
  Filled 2020-08-13 (×3): qty 2

## 2020-08-13 MED ORDER — ACETAMINOPHEN 650 MG RE SUPP: 650.0000 mg | Freq: Four times a day (QID) | RECTAL | Status: DC | PRN

## 2020-08-13 MED ORDER — ALBUTEROL SULFATE HFA 108 (90 BASE) MCG/ACT IN AERS
1.0000 | INHALATION_SPRAY | Freq: Four times a day (QID) | RESPIRATORY_TRACT | Status: DC | PRN
  Filled 2020-08-13: qty 6.7

## 2020-08-13 MED ORDER — SODIUM CHLORIDE 0.9 % IV SOLN
250.0000 mL | INTRAVENOUS | Status: DC | PRN
Start: 2020-08-13 — End: 2020-08-16

## 2020-08-13 MED ORDER — METOPROLOL TARTRATE 25 MG PO TABS
25.0000 mg | ORAL_TABLET | Freq: Two times a day (BID) | ORAL | Status: DC
Start: 2020-08-13 — End: 2020-08-16
  Administered 2020-08-13 – 2020-08-15 (×4): 25 mg via ORAL
  Filled 2020-08-13 (×5): qty 1

## 2020-08-13 MED ORDER — FUROSEMIDE 40 MG PO TABS
40.0000 mg | ORAL_TABLET | Freq: Every day | ORAL | Status: DC
  Administered 2020-08-13 – 2020-08-15 (×3): 40 mg via ORAL
  Filled 2020-08-13 (×2): qty 1
  Filled 2020-08-13: qty 2

## 2020-08-13 MED ORDER — POLYETHYLENE GLYCOL 3350 17 G PO PACK: 17.0000 g | PACK | Freq: Every day | ORAL | Status: DC | PRN

## 2020-08-13 MED ORDER — POTASSIUM CHLORIDE CRYS ER 20 MEQ PO TBCR
40.0000 meq | EXTENDED_RELEASE_TABLET | Freq: Once | ORAL | Status: AC
  Administered 2020-08-13: 15:00:00 40 meq via ORAL
  Filled 2020-08-13: qty 2

## 2020-08-13 MED ORDER — SODIUM CHLORIDE 0.9% FLUSH
3.0000 mL | Freq: Two times a day (BID) | INTRAVENOUS | Status: DC
  Administered 2020-08-13 – 2020-08-15 (×4): 3 mL via INTRAVENOUS

## 2020-08-13 MED ORDER — SODIUM CHLORIDE 0.9 % IV SOLN
500.0000 mg | Freq: Once | INTRAVENOUS | Status: AC
  Administered 2020-08-13: 11:00:00 500 mg via INTRAVENOUS
  Filled 2020-08-13: qty 500

## 2020-08-13 NOTE — Progress Notes (Signed)
Patient admitted to room 28. Alert and oriented x4. Very HOH. Continue on oxygen an 3lpm Sat 93%. Skin dry and warm to touch. Bruises to arms and legs. No open area noted. Patient states have $30, a watch and refused to send to security to be locked.

## 2020-08-13 NOTE — H&P (Addendum)
Date: 08/13/2020               Patient Name:  Alfred Mercado MRN: [id number]  DOB: 10/21/1935 Age / Sex: 84 y.o., male   PCP: Hoy Register, MD         Medical Service: Internal Medicine Teaching Service         Attending Physician: Dr. Mayford Knife, Dorene Ar, MD    First Contact: Dr. Gerarda Fraction Pager: [phone]  Second Contact: Dr. Huel Cote Pager: [phone]       After Hours (After 5p/  First Contact Pager: [phone]  weekends / holidays): Second Contact Pager: [phone]   Chief Complaint: Shortness of Breath  History of Present Illness: Alfred Mercado is a 84 yo M with PMHx of A.fib on Eliquis, HFpEF, h/o pleural effusion s/p thoracentesis *2 presented to [facility] with SOB.  Alfred Mercado endorses DOE for approximately 1 week, in addition to increased productive cough with white sputum. He denies fever, chills, nausea, vomiting, abdominal pain, diarrhea. He notes swelling in his legs, but is not impressed by  It.   He endorses chronic constipation with BM every 2-3 days. Last BM was 2 days ago. He endorses chest pain "every once in a while" that is in a different place each time and lasts a few minutes. He cannot recall how long its been happening for.   Alfred Mercado states he smoked for 52 years and he is trying to quit with patches.   ED Course:  On arrival, patient was saturating at 88% on room air that improved to 100% on 3L [place]. He was normotensive at 133/83, with HR of 100, afebrile at 98.2.  Initial lab work showed a mild leukocytosis of 12.3.  Labs were otherwise unremarkable compared to baseline.  Chest x-ray showed a stable small right pleural effusion with right lower lobe atelectasis versus infiltrate.  Patient was started on CAP coverage with ceftriaxone and azithromycin.  Patient was ambulated and O2 saturations decreased from 92% to 87% on room air with dyspnea that improved to 100% with 2 L.  At rest, patient is not requiring any oxygen.  Consultation was placed on IMTS for  admission.  Meds:  No current facility-administered medications on file prior to encounter.   Current Outpatient Medications on File Prior to Encounter  Medication Sig Dispense Refill  . acetaminophen (TYLENOL) 325 MG tablet Take 650 mg by mouth every 6 (six) hours as needed for mild pain or headache.    . albuterol (VENTOLIN HFA) 108 (90 Base) MCG/ACT inhaler Inhale 2 puffs into the lungs every 6 (six) hours as needed for wheezing or shortness of breath. 18 g 6  . diltiazem (CARDIZEM CD) 120 MG 24 hr capsule Take 1 capsule (120 mg total) by mouth daily. 30 capsule 6  . ELIQUIS 5 MG TABS tablet TAKE 1 TABLET BY MOUTH TWICE DAILY (Patient taking differently: Take 5 mg by mouth 2 (two) times daily.) 60 tablet 1  . furosemide (LASIX) 40 MG tablet TAKE 1 TABLET BY MOUTH DAILY. IF YOUR WEIGHT GOES UP BY 2-3 POUNDS IN A DAY OR 5 POUNDS IN A WEEK, TAKE 1 EXTRA TABLET OF LASIX (Patient taking differently: Take 40 mg by mouth See admin instructions.) 45 tablet 2  . metoprolol tartrate (LOPRESSOR) 25 MG tablet TAKE 1 TABLET(25 MG) BY MOUTH TWICE DAILY (Patient taking differently: Take 25 mg by mouth 2 (two) times daily.) 60 tablet 2  . nicotine (NICODERM CQ - DOSED IN MG/24 HOURS)  21 mg/24hr patch Place 1 patch (21 mg total) onto the skin daily. 28 patch 0  . polyethylene glycol powder (GLYCOLAX/MIRALAX) 17 GM/SCOOP powder Take 17 g by mouth daily. 3350 g 1  . Vitamin D, Ergocalciferol, (DRISDOL) 1.25 MG (50000 UNIT) CAPS capsule TAKE 1 CAPSULE BY MOUTH 1 TIME A WEEK (Patient taking differently: Take 50,000 Units by mouth every 7 (seven) days. Usually Wednesday) 12 capsule 0  . nitrofurantoin, macrocrystal-monohydrate, (MACROBID) 100 MG capsule Take 1 capsule (100 mg total) by mouth every 12 (twelve) hours. (Patient not taking: Reported on 08/13/2020) 14 capsule 0  . Tiotropium Bromide Monohydrate (SPIRIVA RESPIMAT) 2.5 MCG/ACT AERS Inhale 2 puffs into the lungs daily. 30 each 3   Allergies: Allergies as  of 08/12/2020  . (No Known Allergies)   Past Medical History:  Diagnosis Date  . Acute heart failure with preserved ejection fraction (HCC)   . Atrial fibrillation with rapid ventricular response (HCC) 07/11/2019   Family History:  Family History  Problem Relation Age of Onset  . Aneurysm Father    Social History:  Quit drinking 5 years ago; used to drink a fifth per day.  50 pack year tobacco history  He lives alone and prefers it that way.   Review of Systems: A complete ROS was negative except as per HPI.   Physical Exam: Blood pressure 125/74, pulse 83, temperature (!) 97.3 F (36.3 C), temperature source Oral, resp. rate 18, height 6\' 2"  (1.88 m), weight 82.1 kg, SpO2 98 %.  Physical exam: General: Well developed, hard of hearing elderly male lying comfortably in bed, NAD HEENT: [place]/AT, MMM, EOMI, Neck supple, normal ROM CV: Irregular irregular, normal rate, S1, S2 normal, no rubs, no murmurs Pulmonary: Decreased breath sound on R lower lung field Abdominal: Soft, no rigidity, no guarding, no abdominal tenderness, BS+ Extremities: No peripheral edema.  Neuro: AAO*3 Psych: Normal mood and affect, normal thought content, normal judgment.  EKG: personally reviewed my interpretation is Atrial fibrillation, Left anterior fascicular block, No significant change since last tracing  CXR: personally reviewed my interpretation is stable small right pleural effusion, and right lower lobe atelectasis versus infiltrate. Stable cardiomegaly and pulmonary vascular congestion. No acute findings.  Assessment & Plan by Problem: Active Problems:   Acute respiratory failure with hypoxia [facility])  Alfred Mercado is a 84 yo M with PMHx of A.fib on Eliquis, HFpEF, h/o pleural effusion s/p thoracentesis *2 presented to [facility] with SOB.  # Acute Hypoxic Respiratory Failure  # Community Acquired Pneumonia  Suspect CAP given leukocytosis with productive cough, SOB, although HFpEF may be contributing.  Patient presented hypoxic 88% on room air on arrival. Improved with 3 L of [place]. Now at 0-3 L and saturating normal. CXR with lobar infiltrate versus atelectasis (right lower lobe).   --Keep O2 sat > 88% --Received 1 dose of Rocephin in ED --Received 1 dose Azithromycin 500 mg   # HFpEF No significant hypervolemia on examination with BNP at baseline.   --Home Furosemide 40 mg daily --BMP daily --Strict I & O's --Dailly weights  # Persistent Atrial Fibrillation In persistent A-fib. On metoprolol, diltiazem at home. HR 90-100's. CHAD-VASc score of 4. Has-bled score of 2. Currently on Eliquis.   --C/w Eliquis --C/w Metoprolol --C/w Diltiazem   # Vitamin D deficiency Takes Vitamin D at home. Calcium 8.8 this admission  -- Takes on Wednesdays.   # Tobacco use disorder -- C/w nicotine patch   Dispo: Admit patient to Observation with expected length of  stay less than 2 midnights.  Signed: Arnoldo Lenis, MD 08/13/2020, 7:26 PM  Pager: [phone] After 5pm on weekdays and 1pm on weekends: On Call pager: [phone]

## 2020-08-13 NOTE — ED Provider Notes (Signed)
TIME SEEN: 5:10 AM  CHIEF COMPLAINT: Chills, palpitations, shortness of breath  HPI: Patient is an 84 year old male with history of atrial fibrillation, CHF, tobacco use who presents to the emergency department with palpitations and feeling like his heart is racing, shortness of breath for the past couple of days.  He denies any chest pain or chest discomfort.  He denies fevers but states he has had chills.  Reports productive cough but this is not unusual for him given he has smoked for over 50 years.  No lower extremity swelling or pain.  ROS: See HPI Constitutional: no fever  Eyes: no drainage  ENT: no runny nose   Cardiovascular:   No chest pain  Resp:  SOB  GI: no vomiting GU: no dysuria Integumentary: no rash  Allergy: no hives  Musculoskeletal: no leg swelling  Neurological: no slurred speech ROS otherwise negative  PAST MEDICAL HISTORY/PAST SURGICAL HISTORY:  Past Medical History:  Diagnosis Date  . Acute heart failure with preserved ejection fraction (HCC)   . Atrial fibrillation with rapid ventricular response (HCC) 07/11/2019    MEDICATIONS:  Prior to Admission medications   Medication Sig Start Date End Date Taking? Authorizing Provider  acetaminophen (TYLENOL) 325 MG tablet Take 650 mg by mouth every 6 (six) hours as needed for mild pain or headache.    [provider]  albuterol (VENTOLIN HFA) 108 (90 Base) MCG/ACT inhaler Inhale 2 puffs into the lungs every 6 (six) hours as needed for wheezing or shortness of breath. 02/25/20   Hoy Register, MD  diltiazem (CARDIZEM CD) 120 MG 24 hr capsule Take 1 capsule (120 mg total) by mouth daily. 06/27/20   Newlin, Odette Horns, MD  ELIQUIS 5 MG TABS tablet TAKE 1 TABLET BY MOUTH TWICE DAILY Patient taking differently: Take 5 mg by mouth 2 (two) times daily.  05/27/20   Hoy Register, MD  furosemide (LASIX) 40 MG tablet TAKE 1 TABLET BY MOUTH DAILY. IF YOUR WEIGHT GOES UP BY 2-3 POUNDS IN A DAY OR 5 POUNDS IN A WEEK, TAKE 1  EXTRA TABLET OF LASIX Patient taking differently: Take 40 mg by mouth See admin instructions.  05/27/20   Hoy Register, MD  metoprolol tartrate (LOPRESSOR) 25 MG tablet TAKE 1 TABLET(25 MG) BY MOUTH TWICE DAILY Patient taking differently: Take 25 mg by mouth 2 (two) times daily.  05/27/20   Hoy Register, MD  nicotine (NICODERM CQ - DOSED IN MG/24 HOURS) 21 mg/24hr patch Place 1 patch (21 mg total) onto the skin daily. 06/07/20   Hoy Register, MD  nitrofurantoin, macrocrystal-monohydrate, (MACROBID) 100 MG capsule Take 1 capsule (100 mg total) by mouth every 12 (twelve) hours. 07/04/20   Hoy Register, MD  polyethylene glycol powder (GLYCOLAX/MIRALAX) 17 GM/SCOOP powder Take 17 g by mouth daily. 03/02/20   Hoy Register, MD  Tiotropium Bromide Monohydrate (SPIRIVA RESPIMAT) 2.5 MCG/ACT AERS Inhale 2 puffs into the lungs daily. 05/30/20   Hoy Register, MD  Vitamin D, Ergocalciferol, (DRISDOL) 1.25 MG (50000 UNIT) CAPS capsule TAKE 1 CAPSULE BY MOUTH 1 TIME A WEEK Patient taking differently: Take 50,000 Units by mouth every 7 (seven) days. Usually Wednesday 05/27/20   Hoy Register, MD    ALLERGIES:  No Known Allergies  SOCIAL HISTORY:  Social History   Tobacco Use  . Smoking status: Light Tobacco Smoker  . Smokeless tobacco: Never Used  Substance Use Topics  . Alcohol use: Not Currently    FAMILY HISTORY: Family History  Problem Relation Age of Onset  .  Aneurysm Father     EXAM: BP 107/75 (BP Location: Right Arm)   Pulse 98   Temp 98.3 F (36.8 C) (Oral)   Resp 18   SpO2 99%  CONSTITUTIONAL: Alert and oriented and responds appropriately to questions.  Elderly, chronically ill-appearing HEAD: Normocephalic EYES: Conjunctivae clear, pupils appear equal, EOM appear intact ENT: normal nose; moist mucous membranes NECK: Supple, normal ROM CARD: Irregularly irregular and rate controlled; S1 and S2 appreciated; no murmurs, no clicks, no rubs, no gallops RESP: Normal  chest excursion without splinting or tachypnea; breath sounds clear and equal bilaterally; no wheezes, no rhonchi, no rales, no hypoxia or respiratory distress, speaking full sentences, diminished at bases bilaterally ABD/GI: Normal bowel sounds; non-distended; soft, non-tender, no rebound, no guarding, no peritoneal signs, no hepatosplenomegaly BACK:  The back appears normal EXT: Normal ROM in all joints; no deformity noted, no edema; no cyanosis, no calf tenderness or calf swelling on exam SKIN: Normal color for age and race; warm; no rash on exposed skin NEURO: Moves all extremities equally PSYCH: The patient's mood and manner are appropriate.   MEDICAL DECISION MAKING: Patient here with complaints of palpitations, shortness of breath.  He denies having any chest pain.  He has had 2 normal high-sensitivity troponins.  He does have a little bit of a leukocytosis here and reports having chills.  Will check rectal temperature.  Will obtain chest x-ray.  EKG shows no new ischemic abnormality.  He is in atrial fibrillation but currently rate controlled.  ED PROGRESS: Chest x-ray concerning for small right pleural effusion as well as right lower lobe atelectasis versus infiltrate.  Will obtain Covid swab.  Will ambulate with pulse oximeter.  If Covid positive, I do not feel patient needs to be on antibiotics.  If negative, will start him on antibiotics.  If he has hypoxic, will admit.  He was hypoxic in the waiting room to 88% on room air however off of oxygen right now he is satting in the mid 90s.  Will check rectal temperature as well.  Signed out to Dr. Myrtis Ser to follow-up on patient's Covid test and how he does with ambulating off of oxygen.  I reviewed all nursing notes and pertinent previous records as available.  I have reviewed and interpreted any EKGs, lab and urine results, imaging (as available).     EKG Interpretation  Date/Time:  Friday August 12 2020 17:14:38 EST Ventricular Rate:   98 PR Interval:    QRS Duration: 90 QT Interval:  354 QTC Calculation: 451 R Axis:   -48 Text Interpretation: Atrial fibrillation Left anterior fascicular block Nonspecific ST abnormality Abnormal ECG No significant change since last tracing Confirmed by Rochele Raring [phone]) on 08/13/2020 5:11:01 AM          Burnadette Pop was evaluated in Emergency Department on 08/13/2020 for the symptoms described in the history of present illness. He was evaluated in the context of the global COVID-19 pandemic, which necessitated consideration that the patient might be at risk for infection with the SARS-CoV-2 virus that causes COVID-19. Institutional protocols and algorithms that pertain to the evaluation of patients at risk for COVID-19 are in a state of rapid change based on information released by regulatory bodies including the CDC and federal and state organizations. These policies and algorithms were followed during the patient's care in the ED.      Shevy Yaney, Layla Maw, DO 08/13/20 0710

## 2020-08-13 NOTE — Plan of Care (Signed)
  Problem: Activity: Goal: Risk for activity intolerance will decrease Outcome: Progressing   Problem: Nutrition: Goal: Adequate nutrition will be maintained Outcome: Progressing   Problem: Coping: Goal: Level of anxiety will decrease Outcome: Progressing   

## 2020-08-13 NOTE — ED Notes (Addendum)
Pt ambulated in room on pulse ox. While ambulating, O2 sat dropped to 87% from 92% with ShOB per pt. Pt is back in stretcher and has been placed on 2L O2 [place], O2 sat is now at 100%. Will titrated O2 back down to room air per EDP, Myrtis Ser, now that pt is at rest.

## 2020-08-13 NOTE — ED Provider Notes (Signed)
Medical Decision Making: Care of patient assumed from Dr. Elesa Massed at 0700.  Agree with history, physical exam and plan.  See their note for further details.  Briefly, The pt p/w Palpitations, hx of a fib, rate controlled currently, chest x-ray showed concern for pneumonia.  Covid test pending, other labs unremarkable.  Patient was documented as hypoxic but has no oxygen on the room and is breathing comfortably..   Current plan is as follows: Covid test, if positive outpatient management if she can ambulate without hypoxia, if negative treat with antibiotics.  Patient's Covid test is negative.  I spoke with him about this and discussed his symptoms, he is having worsening shortness of breath and cough.  That plus chest x-ray is we will check a BNP to make sure he is not fluid overloaded on exam he does not have a significant pitting, will also give him antibiotics to cover for community-acquired pneumonia, I spoke with pharmacy about this, do not feel strongly that he needs hospital or healthcare associated pneumonia coverage as he never got full coverage for pneumonia on previous admission, and he would be want to benefit well from atypical coverage.  Patient has difficulty with ambulating, significant shortness of breath, nursing ambulated him with pulse ox and said his oxygenation dropped and he required rest with supplemental oxygen.  This patient will likely require admission for acute hypoxic respiratory failure possibly in the setting of pneumonia, BNP pending.  I personally reviewed and interpreted all labs/imaging.      Sabino Donovan, MD 08/13/20 [phone]

## 2020-08-14 DIAGNOSIS — J9601 Acute respiratory failure with hypoxia: Secondary | ICD-10-CM | POA: Diagnosis present

## 2020-08-14 DIAGNOSIS — Z20822 Contact with and (suspected) exposure to covid-19: Secondary | ICD-10-CM | POA: Diagnosis present

## 2020-08-14 DIAGNOSIS — I4891 Unspecified atrial fibrillation: Secondary | ICD-10-CM

## 2020-08-14 DIAGNOSIS — I5032 Chronic diastolic (congestive) heart failure: Secondary | ICD-10-CM | POA: Diagnosis present

## 2020-08-14 DIAGNOSIS — F17210 Nicotine dependence, cigarettes, uncomplicated: Secondary | ICD-10-CM | POA: Diagnosis present

## 2020-08-14 DIAGNOSIS — Z8249 Family history of ischemic heart disease and other diseases of the circulatory system: Secondary | ICD-10-CM | POA: Diagnosis not present

## 2020-08-14 DIAGNOSIS — K5909 Other constipation: Secondary | ICD-10-CM | POA: Diagnosis present

## 2020-08-14 DIAGNOSIS — J441 Chronic obstructive pulmonary disease with (acute) exacerbation: Secondary | ICD-10-CM | POA: Diagnosis present

## 2020-08-14 DIAGNOSIS — Z79899 Other long term (current) drug therapy: Secondary | ICD-10-CM | POA: Diagnosis not present

## 2020-08-14 DIAGNOSIS — J440 Chronic obstructive pulmonary disease with acute lower respiratory infection: Secondary | ICD-10-CM | POA: Diagnosis present

## 2020-08-14 DIAGNOSIS — R002 Palpitations: Secondary | ICD-10-CM

## 2020-08-14 DIAGNOSIS — Z720 Tobacco use: Secondary | ICD-10-CM

## 2020-08-14 DIAGNOSIS — E559 Vitamin D deficiency, unspecified: Secondary | ICD-10-CM | POA: Diagnosis present

## 2020-08-14 DIAGNOSIS — I4819 Other persistent atrial fibrillation: Secondary | ICD-10-CM | POA: Diagnosis present

## 2020-08-14 DIAGNOSIS — Z7901 Long term (current) use of anticoagulants: Secondary | ICD-10-CM | POA: Diagnosis not present

## 2020-08-14 DIAGNOSIS — J189 Pneumonia, unspecified organism: Secondary | ICD-10-CM | POA: Diagnosis present

## 2020-08-14 LAB — CBC
HCT: 26.8 % — ABNORMAL LOW (ref 39.0–52.0)
HCT: 28.2 % — ABNORMAL LOW (ref 39.0–52.0)
Hemoglobin: 8.8 g/dL — ABNORMAL LOW (ref 13.0–17.0)
Hemoglobin: 9.2 g/dL — ABNORMAL LOW (ref 13.0–17.0)
MCH: 29.2 pg (ref 26.0–34.0)
MCH: 29.1 pg (ref 26.0–34.0)
MCHC: 32.8 g/dL (ref 30.0–36.0)
MCHC: 32.6 g/dL (ref 30.0–36.0)
MCV: 89 fL (ref 80.0–100.0)
MCV: 89.2 fL (ref 80.0–100.0)
Platelets: 218 10*3/uL (ref 150–400)
Platelets: 229 10*3/uL (ref 150–400)
RBC: 3.01 MIL/uL — ABNORMAL LOW (ref 4.22–5.81)
RBC: 3.16 MIL/uL — ABNORMAL LOW (ref 4.22–5.81)
RDW: 14 % (ref 11.5–15.5)
RDW: 14 % (ref 11.5–15.5)
WBC: 11.8 10*3/uL — ABNORMAL HIGH (ref 4.0–10.5)
WBC: 11.4 10*3/uL — ABNORMAL HIGH (ref 4.0–10.5)
nRBC: 0 % (ref 0.0–0.2)
nRBC: 0 % (ref 0.0–0.2)

## 2020-08-14 LAB — BASIC METABOLIC PANEL
Anion gap: 12 (ref 5–15)
BUN: 20 mg/dL (ref 8–23)
CO2: 23 mmol/L (ref 22–32)
Calcium: 8.5 mg/dL — ABNORMAL LOW (ref 8.9–10.3)
Chloride: 102 mmol/L (ref 98–111)
Creatinine, Ser: 1.48 mg/dL — ABNORMAL HIGH (ref 0.61–1.24)
GFR, Estimated: 47 mL/min — ABNORMAL LOW (ref >60)
Glucose, Bld: 102 mg/dL — ABNORMAL HIGH (ref 70–99)
Potassium: 3.5 mmol/L (ref 3.5–5.1)
Sodium: 137 mmol/L (ref 135–145)

## 2020-08-14 LAB — PROCALCITONIN: Procalcitonin: 0.2 ng/mL

## 2020-08-14 MED ORDER — AZITHROMYCIN 250 MG PO TABS
250.0000 mg | ORAL_TABLET | Freq: Every day | ORAL | Status: DC
  Administered 2020-08-14 – 2020-08-15 (×2): 250 mg via ORAL
  Filled 2020-08-14 (×2): qty 1

## 2020-08-14 MED ORDER — IPRATROPIUM-ALBUTEROL 0.5-2.5 (3) MG/3ML IN SOLN: 3.0000 mL | Freq: Four times a day (QID) | RESPIRATORY_TRACT | Status: DC | PRN

## 2020-08-14 NOTE — Progress Notes (Signed)
Patient ambulated for 40 ft. Sat remain between 87% and 91%. Sat remain 90% and  93% RA at rest.

## 2020-08-14 NOTE — Progress Notes (Signed)
Subjective:  Hd# 1 No acute overnight events. [name] states he is feeling a little better today. He notes productive cough that is intermittently been bothering him. He notes he had experienced DOE at home on Thursday, but he got up today and it was better.   Objective:  Vital signs in last 24 hours: Vitals:   08/13/20 2016 08/14/20 0432 08/14/20 0812 08/14/20 1434  BP: (!) 93/49 (!) 132/92 108/66 107/63  Pulse: 71 87 89 83  Resp: 15 18 18 17   Temp: 98.4 F (36.9 C) 97.9 F (36.6 C) (!) 97.5 F (36.4 C) 97.8 F (36.6 C)  TempSrc: Oral Oral Oral Oral  SpO2: 99% 99% 97% 95%  Weight:      Height:       CBC Latest Ref Rng & Units 08/14/2020 08/14/2020 08/12/2020  WBC 4.0 - 10.5 K/uL 11.4(H) 11.8(H) 12.3(H)  Hemoglobin 13.0 - 17.0 g/dL 08/14/2020) 9.2(E) 11.3(L)  Hematocrit 39.0 - 52.0 % 28.2(L) 26.8(L) 33.7(L)  Platelets 150 - 400 K/uL 229 218 326   BMP Latest Ref Rng & Units 08/14/2020 08/12/2020 06/06/2020  Glucose 70 - 99 mg/dL 08/06/2020) 341(D) 622(W)  BUN 8 - 23 mg/dL 20 12 18   Creatinine 0.61 - 1.24 mg/dL 979(G) ) 9.21(J)  BUN/Creat Ratio 10 - 24 - - -  Sodium 135 - 145 mmol/L 137 139 135  Potassium 3.5 - 5.1 mmol/L 3.5 3.4(L) 4.1  Chloride 98 - 111 mmol/L 102 101 98  CO2 22 - 32 mmol/L 23 25 27   Calcium 8.9 - 10.3 mg/dL 9.41(D) 4.08(X) )    Physical exam: General: Well developed, hard of hearing elderly male lying comfortably in bed, NAD CV: Irregular irregular, normal rate, S1, S2 normal, no rubs, no murmurs Pulmonary: Decreased breath sound on R lower lung field Abdominal: Soft, no rigidity, no guarding, no abdominal tenderness, BS+ Extremities: No peripheral edema.  Neuro: AAO*3 Psych: Normal mood and affect, normal thought content, normal judgment  Assessment/Plan: [name] is a 84 yo M with PMHx of A.fib on Eliquis, HFpEF, h/o pleural effusion s/p thoracentesis *2 presented to [facility] with SOB.  Active Problems:   Acute respiratory failure with  hypoxia (HCC)  # Acute Hypoxic Respiratory Failure  # Community Acquired Pneumonia  Patient saturating well on room air at rest and on ambulation saturates b/w 87% and 91%. Continuing hsi Azithromycin for 4 more doses, given decreased breath sounds on R lower lung field. WBC's 11.4, trending down from 11.8. Had a sudden drop in Hemoglobin from 11.3 to 8.8, on repeat CBC - Hb is 9.2, will continue following CBC daily. CXR with lobar infiltrate versus atelectasis (right lower lobe).   --Keep O2 sat > 88% --Received 1 dose of Rocephin in ED --Received 1 dose Azithromycin 500 mg  --Start Azithromycin 250 mg for 4 doses.  --CBC daily   # HFpEF Patient euvolemic on examination today.   --Home Furosemide 40 mg daily --BMP daily --Strict I & O's --Dailly weights  # Persistent Atrial Fibrillation In persistent A-fib. On metoprolol, diltiazem at home. HR in 80's today. CHAD-VASc score of 4. Has-bled score of 2. Currently on Eliquis. Held 1 dose of Eliquis this morning for sudden drop in Hb but will be re-started in evening.   --C/w Eliquis --C/w Metoprolol --C/w Diltiazem   # Vitamin D deficiency Takes Vitamin D at home. Calcium 8.8 this admission  -- Takes on Wednesdays.   # Tobacco use disorder -- C/w nicotine patch   Prior to  Admission Living Arrangement: Home Anticipated Discharge Location: Home Barriers to Discharge: Ongoing medical management Dispo: Anticipated discharge in approximately 1-2 day(s).   Alfred Lenis, MD 08/14/2020, 4:39 PM Pager: [phone] After 5pm on weekdays and 1pm on weekends: On Call pager [phone]

## 2020-08-14 NOTE — Plan of Care (Signed)
  Problem: Activity: Goal: Risk for activity intolerance will decrease 08/14/2020 0734 by Penelope Galas, RN Outcome: Progressing 08/13/2020 1744 by Penelope Galas, RN Outcome: Progressing   Problem: Nutrition: Goal: Adequate nutrition will be maintained 08/14/2020 0734 by Penelope Galas, RN Outcome: Progressing 08/13/2020 1744 by Penelope Galas, RN Outcome: Progressing   Problem: Coping: Goal: Level of anxiety will decrease 08/14/2020 0734 by Penelope Galas, RN Outcome: Progressing 08/13/2020 1744 by Penelope Galas, RN Outcome: Progressing

## 2020-08-15 ENCOUNTER — Encounter (HOSPITAL_COMMUNITY): Payer: Self-pay | Admitting: Internal Medicine

## 2020-08-15 DIAGNOSIS — J189 Pneumonia, unspecified organism: Principal | ICD-10-CM

## 2020-08-15 LAB — CBC
HCT: 28 % — ABNORMAL LOW (ref 39.0–52.0)
Hemoglobin: 9.1 g/dL — ABNORMAL LOW (ref 13.0–17.0)
MCH: 29.4 pg (ref 26.0–34.0)
MCHC: 32.5 g/dL (ref 30.0–36.0)
MCV: 90.3 fL (ref 80.0–100.0)
Platelets: 249 10*3/uL (ref 150–400)
RBC: 3.1 MIL/uL — ABNORMAL LOW (ref 4.22–5.81)
RDW: 14.1 % (ref 11.5–15.5)
WBC: 9.9 10*3/uL (ref 4.0–10.5)
nRBC: 0 % (ref 0.0–0.2)

## 2020-08-15 LAB — BASIC METABOLIC PANEL
Anion gap: 10 (ref 5–15)
BUN: 20 mg/dL (ref 8–23)
CO2: 25 mmol/L (ref 22–32)
Calcium: 8.3 mg/dL — ABNORMAL LOW (ref 8.9–10.3)
Chloride: 100 mmol/L (ref 98–111)
Creatinine, Ser: 1.41 mg/dL — ABNORMAL HIGH (ref 0.61–1.24)
GFR, Estimated: 50 mL/min — ABNORMAL LOW (ref >60)
Glucose, Bld: 115 mg/dL — ABNORMAL HIGH (ref 70–99)
Potassium: 3.2 mmol/L — ABNORMAL LOW (ref 3.5–5.1)
Sodium: 135 mmol/L (ref 135–145)

## 2020-08-15 LAB — PROCALCITONIN: Procalcitonin: 0.24 ng/mL

## 2020-08-15 MED ORDER — AZITHROMYCIN 250 MG PO TABS: ORAL_TABLET | ORAL | 0 refills | Status: DC

## 2020-08-15 NOTE — Plan of Care (Signed)
  Problem: Activity: Goal: Risk for activity intolerance will decrease Outcome: Adequate for Discharge   Problem: Nutrition: Goal: Adequate nutrition will be maintained Outcome: Adequate for Discharge   Problem: Coping: Goal: Level of anxiety will decrease Outcome: Adequate for Discharge   

## 2020-08-15 NOTE — Plan of Care (Signed)
  Problem: Activity: Goal: Risk for activity intolerance will decrease Outcome: Progressing   Problem: Nutrition: Goal: Adequate nutrition will be maintained Outcome: Progressing   

## 2020-08-15 NOTE — Progress Notes (Addendum)
   Subjective:    Alfred Mercado states that Alfred Mercado is continuing to cough today. Otherwise, no acute complaints. Alfred Mercado is eager to go home today but does not want to go home with oxygen.   Objective:  Vital signs in last 24 hours: Vitals:   08/14/20 1434 08/14/20 2100 08/15/20 0522 08/15/20 0830  BP: 107/63 123/76 120/64 118/63  Pulse: 83 81 86 89  Resp: 17 17 17 18   Temp: 97.8 F (36.6 C) 98.6 F (37 C) (!) 97.5 F (36.4 C) 97.6 F (36.4 C)  TempSrc: Oral Oral Oral Oral  SpO2: 95% 94% 98% 95%  Weight:      Height:       Physical Exam Vitals and nursing note reviewed.  Constitutional:      General: Alfred Mercado is not in acute distress.    Appearance: Alfred Mercado is normal weight.  Cardiovascular:     Rate and Rhythm: Normal rate. Rhythm irregular.  Pulmonary:     Effort: Pulmonary effort is normal. No respiratory distress.     Breath sounds: Examination of the right-lower field reveals decreased breath sounds. Decreased breath sounds and rales (bibasilar) present.     Comments: Oxygen saturation between 94-97% on room air at rest.  Neurological:     Mental Status: Alfred Mercado is alert.  Psychiatric:        Mood and Affect: Mood normal.    Assessment/Plan: Active Problems:   Acute respiratory failure with hypoxia [facility])  Alfred Mercado is a 84 yo Mercado with PMHx of A.fib on Eliquis, HFpEF, h/o pleural effusion s/p thoracentesis presented to [facility] with SOB and admitted for CAP.   # Community Acquired Pneumonia  Patient saturating well on room air at rest and on ambulation saturates b/w 87% and 91%.Will recheck today as patient is reluctant to go home on oxygen. Plan to complete 5 day course of Azithromycin.   --Pulse ox with ambulation to determine home oxygen needs --Continue Azithromycin 250 mg to complete 5 day course   # HFpEF Euvolemic today.   --Continue Furosemide 40 mg daily  Prior to Admission Living Arrangement: Home Anticipated Discharge Location: Home  Dispo: Anticipated discharge in  approximately 0 day(s).   [facility], MD 08/15/2020, 9:34 AM Pager: [phone]  After 5pm on weekdays and 1pm on weekends: On Call pager [phone]

## 2020-08-15 NOTE — Progress Notes (Signed)
Pt is being discharged. He was given his AVS discharge summary and went over it with him. IV was removed with catheter intact. Pt had no further questions.

## 2020-08-16 ENCOUNTER — Telehealth: Payer: Self-pay

## 2020-08-16 NOTE — Telephone Encounter (Signed)
Transition Care Management Follow-up Telephone Call  Date of discharge and from where: 08/15/2020, [facility]  How have you been since you were released from the hospital? He said he is doing " so, so" just got home late last night   Any questions or concerns? Yes - he said he fatigues with ambulating short distances and would like to get a power chair.  Will discuss with PCP.  He also stated that he has some " clear water" coming out of his nose, not mucous, and no other symptoms. Informed him Dr Alvis Lemmings would be notified.   He is interested in home health /personal care services and said he will discuss with this CM when he comes to his appointment.   Items Reviewed:  Did the pt receive and understand the discharge instructions provided? he said that he has them somewhere.  instructed him to call this office if he has any questions after reviewing them again, Instructed him to review the medication list carefully and also call the clinic if he has any questions.   Medications obtained and verified? he needs to find and review the medication list on his AVS.  he said has to pick up some medications at his pharmacy. he could not confirm that he has all of the meds  Other? No   Any new allergies since your discharge? No   Do you have support at home? Yes , his neighbors provide assistance.   Home Care and Equipment/Supplies: Were home health services ordered? no If so, what is the name of the agency? n/a  Has the agency set up a time to come to the patient's home?n/a Were any new equipment or medical supplies ordered?  No, he already has a cane What is the name of the medical supply agency? n/a Were you able to get the supplies/equipment? N/a Do you have any questions related to the use of the equipment or supplies? No, n/a  Functional Questionnaire: (I = Independent and D = Dependent) ADLs: independent, neighbors assist as needed,   Follow up appointments  reviewed:   PCP Hospital f/u appt confirmed? Yes  - scheduled appointment with Dr Alvis Lemmings 09/01/2020.  Sent him a reminder letter as he requested.   Specialist Hospital f/u appt confirmed? No , none scheduled at this time. He said he wants to see his cardiologist.  He said he has their phone number and will call tomorrow.   Are transportation arrangements needed? No  - he said he will arrange transportation with his neighbor.  Reminded him that he can contact medicaid for transportation to medical appointments.  He said he tried that in the past but they never called him back.   If their condition worsens, is the pt aware to call PCP or go to the Emergency Dept.?yes  Was the patient provided with contact information for the PCP's office or ED?   He has the phone number for [facility]  Was to pt encouraged to call back with questions or concerns? Yes

## 2020-08-16 NOTE — Telephone Encounter (Signed)
Form the discharge call.  He has appointment with Dr Alvis Lemmings 09/01/2020,   He said he is doing " so, so" just got home late last night  He said he fatigues with ambulating short distances and would like to get a power chair.  He will discuss with PCP.  He also stated that he has some " clear water" coming out of his nose, not mucous, and no other symptoms. Informed him Dr Alvis Lemmings would be notified.   He is interested in home health /personal care services and said he will discuss with this CM when he comes to his appointment.   he needs to find and review the medication list on his AVS.  he said has to pick up some medications at his pharmacy. he could not confirm that he has all of the meds  He said he wants to see his cardiologist.  He said he has their phone number and will call tomorrow

## 2020-08-17 NOTE — Discharge Summary (Signed)
Name: Alfred Mercado MRN: [id number] DOB: 10-Aug-1936 84 y.o. PCP: Alfred Register, MD  Date of Admission: 08/12/2020  5:15 PM Date of Discharge: 08/15/2020 Attending Physician: Dr. Antony Contras Discharge Diagnosis: 1. Acute Hypoxic Respiratory Failure  2. Community Acquired Pneumonia  Discharge Medications: Allergies as of 08/15/2020   No Known Allergies     Medication List    STOP taking these medications   nitrofurantoin (macrocrystal-monohydrate) 100 MG capsule Commonly known as: MACROBID     TAKE these medications   acetaminophen 325 MG tablet Commonly known as: TYLENOL Take 650 mg by mouth every 6 (six) hours as needed for mild pain or headache.   albuterol 108 (90 Base) MCG/ACT inhaler Commonly known as: VENTOLIN HFA Inhale 2 puffs into the lungs every 6 (six) hours as needed for wheezing or shortness of breath.   azithromycin 250 MG tablet Commonly known as: Zithromax Take 1 tablet per day starting on 08/16/2020   diltiazem 120 MG 24 hr capsule Commonly known as: CARDIZEM CD Take 1 capsule (120 mg total) by mouth daily.   Eliquis 5 MG Tabs tablet Generic drug: apixaban TAKE 1 TABLET BY MOUTH TWICE DAILY What changed: how much to take   furosemide 40 MG tablet Commonly known as: LASIX TAKE 1 TABLET BY MOUTH DAILY. IF YOUR WEIGHT GOES UP BY 2-3 POUNDS IN A DAY OR 5 POUNDS IN A WEEK, TAKE 1 EXTRA TABLET OF LASIX What changed: See the new instructions.   metoprolol tartrate 25 MG tablet Commonly known as: LOPRESSOR TAKE 1 TABLET(25 MG) BY MOUTH TWICE DAILY What changed: See the new instructions.   nicotine 21 mg/24hr patch Commonly known as: NICODERM CQ - dosed in mg/24 hours Place 1 patch (21 mg total) onto the skin daily.   polyethylene glycol powder 17 GM/SCOOP powder Commonly known as: GLYCOLAX/MIRALAX Take 17 g by mouth daily.   Spiriva Respimat 2.5 MCG/ACT Aers Generic drug: Tiotropium Bromide Monohydrate Inhale 2 puffs into the lungs daily.    Vitamin D (Ergocalciferol) 1.25 MG (50000 UNIT) Caps capsule Commonly known as: DRISDOL TAKE 1 CAPSULE BY MOUTH 1 TIME A WEEK What changed: See the new instructions.       Disposition and follow-up:   [name] was discharged from [facility] in Stable condition.  At the hospital follow up visit please address:  1.  - Acute hypoxic respiratory failure: Improved, no further follow up at this time.      -CAP: Please complete course of Azithromycin and see your PCP in 1 week.  2.  Labs / imaging needed at time of follow-up: None  3.  Pending labs/ test needing follow-up: None  Follow-up Appointments:  Follow-up Information    Alfred Register, MD. Schedule an appointment as soon as possible for a visit in 1 week(s).   Specialty: Family Medicine Contact information: [address] [place] [address] [phone]               Hospital Course by problem list: [name] is a 84 yo M with PMHx of A.fib on Eliquis, HFpEF, h/o pleural effusion s/p thoracentesis *2 presented to [facility] with SOB.   # Acute Hypoxic Respiratory Failure  # Community Acquired Pneumonia  Suspected CAP given leukocytosis with productive cough, SOB, although HFpEF may be contributing. Patient presented hypoxic 88% on room air on arrival. Improved with 3 L of Gerald.Later improved saturating normal. CXR with lobar infiltrate versus atelectasis (right lower lobe). Received 1 dose of Rocephin in ED.  Plan to complete 5 day course of Azithromycin.      # HFpEF No significant hypervolemia on examination with BNP at baseline. Continued on Home Furosemide 40 mg daily   # Persistent Atrial Fibrillation In persistent A-fib. On metoprolol, diltiazem at home.On presentation HR 90-100's. CHAD-VASc score of 4. Has-bled score of 2. Continued on Eliquis, Metoprolol, Diltiazem and  HR in 80's.   Discharge Vitals:   BP 113/65 (BP Location: Right Arm)   Pulse 87   Temp 97.9 F (36.6 C)  (Oral)   Resp 18   Ht 6\' 2"  (1.88 m)   Wt 82.1 kg   SpO2 97%   BMI 23.24 kg/m   Pertinent Labs, Studies, and Procedures:  CBC Latest Ref Rng & Units 08/15/2020 08/14/2020 08/14/2020  WBC 4.0 - 10.5 K/uL 9.9 11.4(H) 11.8(H)  Hemoglobin 13.0 - 17.0 g/dL 08/16/2020) 1.5(Q) 0.0(Q)  Hematocrit 39.0 - 52.0 % 28.0(L) 28.2(L) 26.8(L)  Platelets 150 - 400 K/uL 249 229 218   BMP Latest Ref Rng & Units 08/15/2020 08/14/2020 08/12/2020  Glucose 70 - 99 mg/dL 08/14/2020) 195(K) 932(I)  BUN 8 - 23 mg/dL 20 20 12   Creatinine 0.61 - 1.24 mg/dL 712(W) ) 5.80(D)  BUN/Creat Ratio 10 - 24 - - -  Sodium 135 - 145 mmol/L 135 137 139  Potassium 3.5 - 5.1 mmol/L 3.2(L) 3.5 3.4(L)  Chloride 98 - 111 mmol/L 100 102 101  CO2 22 - 32 mmol/L 25 23 25   Calcium 8.9 - 10.3 mg/dL 8.3(L) 8.5(L) 8.8(L)   Chest x-ray:  IMPRESSION: Stable small right pleural effusion, and right lower lobe atelectasis versus infiltrate.  Stable cardiomegaly and pulmonary vascular congestion.  No acute findings  Discharge Instructions: Discharge Instructions    Call MD for:  difficulty breathing, headache or visual disturbances   Complete by: As directed    Call MD for:  extreme fatigue   Complete by: As directed    Call MD for:  persistant dizziness or light-headedness   Complete by: As directed    Call MD for:  persistant nausea and vomiting   Complete by: As directed    Call MD for:  severe uncontrolled pain   Complete by: As directed    Call MD for:  temperature >100.4   Complete by: As directed    Diet - low sodium heart healthy   Complete by: As directed    Discharge instructions   Complete by: As directed    Mr. 9.83(J were admitted to the hospital for pneumonia and treated with antibiotics. Please take 1 tablet of Azithromycin tomorrow and the day after to complete the course.   Please make sure to follow up with your primary care doctor in the next 1 week.   It was a pleasure meeting you and we  wish you the best!   - Dr. 8.25(K   Increase activity slowly   Complete by: As directed       Signed: , MD 08/17/2020, 6:13 PM   Pager: [phone]

## 2020-08-25 ENCOUNTER — Other Ambulatory Visit: Payer: Self-pay | Admitting: Family Medicine

## 2020-08-25 DIAGNOSIS — I4891 Unspecified atrial fibrillation: Secondary | ICD-10-CM

## 2020-08-29 ENCOUNTER — Emergency Department (HOSPITAL_COMMUNITY)
Admission: EM | Admit: 2020-08-29 | Discharge: 2020-08-30 | Disposition: A | Discharge status: Home / Self Care | Payer: Medicare Other | Attending: Emergency Medicine | Admitting: Emergency Medicine

## 2020-08-29 ENCOUNTER — Other Ambulatory Visit: Payer: Self-pay

## 2020-08-29 ENCOUNTER — Emergency Department (HOSPITAL_COMMUNITY): Payer: Medicare Other

## 2020-08-29 DIAGNOSIS — I5032 Chronic diastolic (congestive) heart failure: Secondary | ICD-10-CM | POA: Diagnosis not present

## 2020-08-29 DIAGNOSIS — F17200 Nicotine dependence, unspecified, uncomplicated: Secondary | ICD-10-CM | POA: Insufficient documentation

## 2020-08-29 DIAGNOSIS — I5033 Acute on chronic diastolic (congestive) heart failure: Secondary | ICD-10-CM | POA: Insufficient documentation

## 2020-08-29 DIAGNOSIS — Z7901 Long term (current) use of anticoagulants: Secondary | ICD-10-CM | POA: Diagnosis not present

## 2020-08-29 DIAGNOSIS — R0789 Other chest pain: Secondary | ICD-10-CM | POA: Diagnosis not present

## 2020-08-29 DIAGNOSIS — Z20822 Contact with and (suspected) exposure to covid-19: Secondary | ICD-10-CM | POA: Diagnosis not present

## 2020-08-29 DIAGNOSIS — R079 Chest pain, unspecified: Secondary | ICD-10-CM | POA: Diagnosis present

## 2020-08-29 LAB — CBC
HCT: 33.1 % — ABNORMAL LOW (ref 39.0–52.0)
Hemoglobin: 10.9 g/dL — ABNORMAL LOW (ref 13.0–17.0)
MCH: 28.8 pg (ref 26.0–34.0)
MCHC: 32.9 g/dL (ref 30.0–36.0)
MCV: 87.6 fL (ref 80.0–100.0)
Platelets: 281 10*3/uL (ref 150–400)
RBC: 3.78 MIL/uL — ABNORMAL LOW (ref 4.22–5.81)
RDW: 13.2 % (ref 11.5–15.5)
WBC: 6.4 10*3/uL (ref 4.0–10.5)
nRBC: 0 % (ref 0.0–0.2)

## 2020-08-29 LAB — BASIC METABOLIC PANEL
Anion gap: 9 (ref 5–15)
BUN: 10 mg/dL (ref 8–23)
CO2: 27 mmol/L (ref 22–32)
Calcium: 8.4 mg/dL — ABNORMAL LOW (ref 8.9–10.3)
Chloride: 103 mmol/L (ref 98–111)
Creatinine, Ser: 1.2 mg/dL (ref 0.61–1.24)
GFR, Estimated: >60 mL/min (ref >60)
Glucose, Bld: 115 mg/dL — ABNORMAL HIGH (ref 70–99)
Potassium: 3 mmol/L — ABNORMAL LOW (ref 3.5–5.1)
Sodium: 139 mmol/L (ref 135–145)

## 2020-08-29 LAB — TROPONIN I (HIGH SENSITIVITY): Troponin I (High Sensitivity): 9 ng/L (ref <18)

## 2020-08-29 NOTE — ED Triage Notes (Signed)
Patient stated "I don't feel comfortable at home" "I've had this pain in the chest for a couple of days", denies pain when asked.

## 2020-08-30 DIAGNOSIS — R0789 Other chest pain: Secondary | ICD-10-CM | POA: Diagnosis not present

## 2020-08-30 LAB — TROPONIN I (HIGH SENSITIVITY): Troponin I (High Sensitivity): 9 ng/L (ref <18)

## 2020-08-30 LAB — POC SARS CORONAVIRUS 2 AG -  ED: SARS Coronavirus 2 Ag: NEGATIVE

## 2020-08-30 NOTE — Discharge Instructions (Addendum)
Thank you for allowing me to care for you today in the Emergency Department.   You can use your home albuterol inhaler, 2 puffs, every 4-6 hours as needed for wheezing or shortness of breath.  Please follow-up with your primary care provider for recheck of your symptoms.  Continue to take all of your home medications.  Return to the emergency department if you pass out, if you develop respiratory distress, if you develop significant swelling to your legs or abdomen, or have other new, concerning symptoms.

## 2020-08-30 NOTE — ED Notes (Signed)
[name] pusle ox 95-98% RA while ambulating with cane. Pt unsteady on feet, stated "I don't want to walk anymore, I'm kind of short of breath and I need sleep." Pt back in bed asking for food and drink.

## 2020-08-30 NOTE — ED Provider Notes (Signed)
[facility] EMERGENCY DEPARTMENT Provider Note   CSN: [id number] Arrival date & time: 08/29/20  1501     History Chief Complaint  Patient presents with  . Chest Pain    Alfred Mercado is a 85 y.o. male with a history of A. fib on Eliquis, metoprolol, and diltiazem, chronic diastolic CHF with preserved ejection fraction, pleural effusion s/p thoracentesis x2 who presents to the emergency department from home with a chief complaint of chest pain.  The patient reports that he had multiple episodes of "sharp", non-radiating, left-sided chest pain over the last few days.  Last episode was at 13:00.  He denies associated shortness of breath, but reports that he has a gradually worsening intermittently productive cough over the last few days.  No known aggravating or alleviating factors for his symptoms.  No treatment prior to arrival.  Informed the triage RN that he "didn't feel comfortable at home."  He denies fevers, chills, palpitations, leg swelling, abdominal distention, nasal congestion, rhinorrhea, loss of sense of taste or smell, abdominal pain, nausea, vomiting, diarrhea, choking episodes.  Reports compliance with his home medications.  He doubts any recent weight gain, but states that he has not been weighing himself.  He has a longstanding history of tobacco use for more than 50 years and reports that he has cut back on daily tobacco use, but will continues to smoke a couple of cigarettes daily.  He reports compliance with his home Eliquis with no recent missed doses.  Ambulates with a walker at baseline.  The history is provided by the patient and medical records. No language interpreter was used.       Past Medical History:  Diagnosis Date  . Acute heart failure with preserved ejection fraction (HCC)   . Atrial fibrillation with rapid ventricular response (HCC) 07/11/2019    Patient Active Problem List   Diagnosis Date Noted  . Acute respiratory failure  with hypoxia (HCC) 08/13/2020  . UTI (urinary tract infection) 06/02/2020  . Community acquired pneumonia of right lower lobe of lung 06/02/2020  . Acute hypoxemic respiratory failure (HCC) 06/02/2020  . Hypokalemia 06/02/2020  . Acute diastolic CHF (congestive heart failure) (HCC) 02/06/2020  . Exertional shortness of breath 10/14/2019  . Atrial fibrillation with RVR (HCC) 10/14/2019  . Pleural effusion   . Chronic heart failure with preserved ejection fraction [facility])     Past Surgical History:  Procedure Laterality Date  . APPENDECTOMY         Family History  Problem Relation Age of Onset  . Aneurysm Father     Social History   Tobacco Use  . Smoking status: Light Tobacco Smoker  . Smokeless tobacco: Never Used  Substance Use Topics  . Alcohol use: Not Currently  . Drug use: Never    Home Medications Prior to Admission medications   Medication Sig Start Date End Date Taking? Authorizing Provider  acetaminophen (TYLENOL) 325 MG tablet Take 650 mg by mouth every 6 (six) hours as needed for mild pain or headache.    [provider]  albuterol (VENTOLIN HFA) 108 (90 Base) MCG/ACT inhaler Inhale 2 puffs into the lungs every 6 (six) hours as needed for wheezing or shortness of breath. 02/25/20   Hoy Register, MD  azithromycin (ZITHROMAX) 250 MG tablet Take 1 tablet per day starting on 08/16/2020 08/15/20   Verdene Lennert, MD  diltiazem (CARDIZEM CD) 120 MG 24 hr capsule Take 1 capsule (120 mg total) by mouth daily. 06/27/20  Newlin, Enobong, MD  ELIQUIS 5 MG TABS tablet TAKE 1 TABLET BY MOUTH TWICE DAILY 08/25/20   Hoy Register, MD  furosemide (LASIX) 40 MG tablet TAKE 1 TABLET BY MOUTH DAILY. IF YOUR WEIGHT GOES UP BY 2-3 POUNDS IN A DAY OR 5 POUNDS IN A WEEK, TAKE 1 EXTRA TABLET OF LASIX Patient taking differently: Take 40 mg by mouth See admin instructions. 05/27/20   Hoy Register, MD  metoprolol tartrate (LOPRESSOR) 25 MG tablet TAKE 1 TABLET(25 MG) BY MOUTH  TWICE DAILY 08/25/20   Hoy Register, MD  nicotine (NICODERM CQ - DOSED IN MG/24 HOURS) 21 mg/24hr patch Place 1 patch (21 mg total) onto the skin daily. 06/07/20   Hoy Register, MD  polyethylene glycol powder (GLYCOLAX/MIRALAX) 17 GM/SCOOP powder Take 17 g by mouth daily. 03/02/20   Hoy Register, MD  Tiotropium Bromide Monohydrate (SPIRIVA RESPIMAT) 2.5 MCG/ACT AERS Inhale 2 puffs into the lungs daily. 05/30/20   Hoy Register, MD  Vitamin D, Ergocalciferol, (DRISDOL) 1.25 MG (50000 UNIT) CAPS capsule TAKE 1 CAPSULE BY MOUTH 1 TIME A WEEK Patient taking differently: Take 50,000 Units by mouth every 7 (seven) days. Usually Wednesday 05/27/20   Hoy Register, MD    Allergies    Patient has no known allergies.  Review of Systems   Review of Systems  Constitutional: Negative for appetite change and fever.  Respiratory: Positive for cough. Negative for shortness of breath.   Cardiovascular: Positive for chest pain.  Gastrointestinal: Negative for abdominal pain.  Genitourinary: Negative for dysuria.  Musculoskeletal: Negative for back pain.  Skin: Negative for rash.  Allergic/Immunologic: Negative for immunocompromised state.  Neurological: Negative for headaches.  Psychiatric/Behavioral: Negative for confusion.    Physical Exam Updated Vital Signs BP (!) 152/59 (BP Location: Right Arm)   Pulse 68   Temp 97.7 F (36.5 C) (Oral)   Resp 16   SpO2 100%   Physical Exam Vitals and nursing note reviewed.  Constitutional:      Appearance: He is well-developed. He is not toxic-appearing or diaphoretic.     Comments: Chronically elderly male who is nontoxic-appearing.  HENT:     Head: Normocephalic.     Mouth/Throat:     Mouth: Mucous membranes are moist.     Pharynx: No oropharyngeal exudate or posterior oropharyngeal erythema.  Eyes:     Conjunctiva/sclera: Conjunctivae normal.  Cardiovascular:     Rate and Rhythm: Normal rate. Rhythm irregularly irregular.     Pulses:  Normal pulses.     Heart sounds: No murmur heard. No friction rub. No gallop.   Pulmonary:     Effort: Pulmonary effort is normal. No respiratory distress.     Breath sounds: No stridor. No wheezing, rhonchi or rales.  Chest:     Chest wall: No tenderness.  Abdominal:     General: There is no distension.     Palpations: Abdomen is soft. There is no mass.     Tenderness: There is no abdominal tenderness. There is no right CVA tenderness, left CVA tenderness, guarding or rebound.     Hernia: No hernia is present.  Musculoskeletal:     Cervical back: Neck supple.     Right lower leg: No edema.     Left lower leg: No edema.  Skin:    General: Skin is warm and dry.     Capillary Refill: Capillary refill takes less than 2 seconds.     Coloration: Skin is not jaundiced or pale.  Findings: No bruising, lesion or rash.  Neurological:     General: No focal deficit present.     Mental Status: He is alert.  Psychiatric:        Behavior: Behavior normal.     ED Results / Procedures / Treatments   Labs (all labs ordered are listed, but only abnormal results are displayed) Labs Reviewed  BASIC METABOLIC PANEL - Abnormal; Notable for the following components:      Result Value   Potassium 3.0 (*)    Glucose, Bld 115 (*)    Calcium 8.4 (*)    All other components within normal limits  CBC - Abnormal; Notable for the following components:   RBC 3.78 (*)    Hemoglobin 10.9 (*)    HCT 33.1 (*)    All other components within normal limits  POC SARS CORONAVIRUS 2 AG -  ED  TROPONIN I (HIGH SENSITIVITY)  TROPONIN I (HIGH SENSITIVITY)    EKG None  Radiology DG Chest 2 View  Result Date: 08/29/2020 CLINICAL DATA:  Cough, chest pain. EXAM: CHEST - 2 VIEW COMPARISON:  August 13, 2020. FINDINGS: Stable cardiomediastinal silhouette. No pneumothorax is noted. Stable bilateral pleural effusions are noted, right greater than left probable associated atelectasis or infiltrates. Bony thorax  is unremarkable. IMPRESSION: Stable bilateral pleural effusions, right greater than left, probable associated atelectasis or infiltrates. Electronically Signed   By: Marijo Conception M.D.   On: 08/29/2020 16:39    Procedures Procedures (including critical care time)  Medications Ordered in ED Medications - No data to display  ED Course  I have reviewed the triage vital signs and the nursing notes.  Pertinent labs & imaging results that were available during my care of the patient were reviewed by me and considered in my medical decision making (see chart for details).    MDM Rules/Calculators/A&P                          85 year old male with a history of A. fib on Eliquis, metoprolol, and diltiazem, chronic diastolic CHF with preserved ejection fraction, pleural effusion s/p thoracentesis x2 who presents to the emergency department with intermittent vague chest pain over the last few days.  Last episode of chest pain was at 13:00, more than 15 hours ago.  Pain is not pleuritic or exertional.  He also endorses an intermittently productive cough.  No other associated symptoms.  He has been compliant with his home medications, including his home Eliquis without missed doses.  Patient was seen and independently evaluated by Dr. Dayna Barker, attending physician.  Labs and imaging have been reviewed and independently interpreted by me.  EKG with A. fib with controlled ventricular rate. Chest x-ray with stable bilateral pleural effusions, right greater than left.  Patient was just discharged from the hospital on 12/20 with azithromycin for presumed acute hypoxic respiratory failure due to CAP.  He has been compliant with his home azithromycin.  Hemoglobin is improved from previous.  There is mild hypokalemia, but no significant metabolic derangements.  Creatinine is improved from previous.  Delta troponin is not elevated.  No leukocytosis.  COVID-19 test is negative.  Patient was ambulated multiple  times through the department and maintained oxygen saturation 95 to 97%.  I observe the patient ambulating with a Rollator and he appeared steady on his feet.  He has been eating and drinking without difficulty and has been stable since he arrived in the ER more  than 16-1/2 hours ago.  I have a low suspicion for ACS, PE given that he has been compliant with his home DOAC, aortic dissection, aspiration pneumonia, acute acquired pneumonia, pericarditis, myocarditis.  No indication for antibiotics at this time.  Question if his cough is secondary to COPD, but he does not appear to have an exacerbation at this time.  At this time, patient is hemodynamically stable no acute distress.  Safe for discharge home with outpatient follow-up as indicated.  Final Clinical Impression(s) / ED Diagnoses Final diagnoses:  Atypical chest pain    Rx / DC Orders ED Discharge Orders    None       Barkley Boards, PA-C 08/30/20 0752    Mesner, Barbara Cower, MD 08/30/20 2342

## 2020-09-01 ENCOUNTER — Ambulatory Visit: Payer: Medicare Other | Attending: Family Medicine | Admitting: Family Medicine

## 2020-09-01 ENCOUNTER — Encounter: Payer: Self-pay | Admitting: Family Medicine

## 2020-09-01 ENCOUNTER — Other Ambulatory Visit: Payer: Self-pay

## 2020-09-01 DIAGNOSIS — R0609 Other forms of dyspnea: Secondary | ICD-10-CM | POA: Diagnosis not present

## 2020-09-01 DIAGNOSIS — I4891 Unspecified atrial fibrillation: Secondary | ICD-10-CM | POA: Diagnosis not present

## 2020-09-01 DIAGNOSIS — E876 Hypokalemia: Secondary | ICD-10-CM

## 2020-09-01 DIAGNOSIS — R0602 Shortness of breath: Secondary | ICD-10-CM

## 2020-09-01 DIAGNOSIS — F17200 Nicotine dependence, unspecified, uncomplicated: Secondary | ICD-10-CM

## 2020-09-01 DIAGNOSIS — Z720 Tobacco use: Secondary | ICD-10-CM

## 2020-09-01 MED ORDER — DILTIAZEM HCL ER COATED BEADS 120 MG PO CP24: 120.0000 mg | ORAL_CAPSULE | Freq: Every day | ORAL | 6 refills | Status: DC

## 2020-09-01 MED ORDER — APIXABAN 5 MG PO TABS: 5.0000 mg | ORAL_TABLET | Freq: Two times a day (BID) | ORAL | 6 refills | Status: DC

## 2020-09-01 MED ORDER — ALBUTEROL SULFATE HFA 108 (90 BASE) MCG/ACT IN AERS: 2.0000 | INHALATION_SPRAY | Freq: Four times a day (QID) | RESPIRATORY_TRACT | 6 refills | Status: DC | PRN

## 2020-09-01 MED ORDER — METOPROLOL TARTRATE 25 MG PO TABS: ORAL_TABLET | ORAL | 6 refills | Status: DC

## 2020-09-01 MED ORDER — SPIRIVA RESPIMAT 2.5 MCG/ACT IN AERS: 2.0000 | INHALATION_SPRAY | Freq: Every day | RESPIRATORY_TRACT | 6 refills | Status: DC

## 2020-09-01 NOTE — Progress Notes (Signed)
Seen in ED for chest pains. Needs refills.

## 2020-09-01 NOTE — Progress Notes (Signed)
Virtual Visit via Telephone Note  I connected with Alfred Mercado, on 09/01/2020 at 11:03 AM by telephone due to the COVID-19 pandemic and verified that I am speaking with the correct person using two identifiers.   Consent: I discussed the limitations, risks, security and privacy concerns of performing an evaluation and management service by telephone and the availability of in person appointments. I also discussed with the patient that there may be a patient responsible charge related to this service. The patient expressed understanding and agreed to proceed.   Location of Patient: Home  Location of Provider: Home   Persons participating in Telemedicine visit: Manpreet Strey Farrington-CMA Dr. Alvis Lemmings     History of Present Illness: Alfred Mercado is an 85 year old male with a history of atrial fibrillation with RVR, heart failure with preserved ejection fraction (EF 50 to 55%),history of pleural effusion status post thoracocentesis x2here for follow-up visit. He had declined subsequent thoracocentesis and is not interested in any future thoracocentesis.  He was at the ED 1/3-08/30/20 for atypical chest pain and ACS ruled out. EKG revealed Afib with controlled ventricular rate, Sars-CoV2 was negative. CXR revealed: IMPRESSION: Stable bilateral pleural effusions, right greater than left, probable associated atelectasis or infiltrates. He was deemed stable and subsequently discharged. 2 weeks ago he was hospitalized for Community Acquired Pneumonia.  He has had no repeat chest pains since his discharge. He does have some dizziness on rising from a seated position but no palpitations and this is not new for him. His BP at the ED was 152/59. He has no acute concerns today.   Past Medical History:  Diagnosis Date  . Acute heart failure with preserved ejection fraction (HCC)   . Atrial fibrillation with rapid ventricular response (HCC) 07/11/2019   No Known  Allergies  Current Outpatient Medications on File Prior to Visit  Medication Sig Dispense Refill  . acetaminophen (TYLENOL) 325 MG tablet Take 650 mg by mouth every 6 (six) hours as needed for mild pain or headache.    . albuterol (VENTOLIN HFA) 108 (90 Base) MCG/ACT inhaler Inhale 2 puffs into the lungs every 6 (six) hours as needed for wheezing or shortness of breath. 18 g 6  . diltiazem (CARDIZEM CD) 120 MG 24 hr capsule Take 1 capsule (120 mg total) by mouth daily. 30 capsule 6  . ELIQUIS 5 MG TABS tablet TAKE 1 TABLET BY MOUTH TWICE DAILY 60 tablet 1  . furosemide (LASIX) 40 MG tablet TAKE 1 TABLET BY MOUTH DAILY. IF YOUR WEIGHT GOES UP BY 2-3 POUNDS IN A DAY OR 5 POUNDS IN A WEEK, TAKE 1 EXTRA TABLET OF LASIX (Patient taking differently: Take 40 mg by mouth See admin instructions.) 45 tablet 2  . metoprolol tartrate (LOPRESSOR) 25 MG tablet TAKE 1 TABLET(25 MG) BY MOUTH TWICE DAILY 60 tablet 2  . polyethylene glycol powder (GLYCOLAX/MIRALAX) 17 GM/SCOOP powder Take 17 g by mouth daily. 3350 g 1  . Tiotropium Bromide Monohydrate (SPIRIVA RESPIMAT) 2.5 MCG/ACT AERS Inhale 2 puffs into the lungs daily. 30 each 3  . Vitamin D, Ergocalciferol, (DRISDOL) 1.25 MG (50000 UNIT) CAPS capsule TAKE 1 CAPSULE BY MOUTH 1 TIME A WEEK (Patient taking differently: Take 50,000 Units by mouth every 7 (seven) days. Usually Wednesday) 12 capsule 0  . azithromycin (ZITHROMAX) 250 MG tablet Take 1 tablet per day starting on 08/16/2020 (Patient not taking: Reported on 09/01/2020) 2 each 0  . nicotine (NICODERM CQ - DOSED IN MG/24 HOURS) 21 mg/24hr patch Place 1  patch (21 mg total) onto the skin daily. (Patient not taking: Reported on 09/01/2020) 28 patch 0   No current facility-administered medications on file prior to visit.    Observations/Objective: Awake, alert, oriented x3 Not in acute distress  Assessment and Plan: 1. Exertional shortness of breath Likely has COPD given prolonged smoking history but we have  no PFT for a formal diagnosis. PFT ordered but he never followed through - Tiotropium Bromide Monohydrate (SPIRIVA RESPIMAT) 2.5 MCG/ACT AERS; Inhale 2 puffs into the lungs daily.  Dispense: 30 each; Refill: 6  2. Atrial fibrillation with rapid ventricular response (HCC) Stable Follow up with Cardiology - metoprolol tartrate (LOPRESSOR) 25 MG tablet; TAKE 1 TABLET(25 MG) BY MOUTH TWICE DAILY  Dispense: 60 tablet; Refill: 6 - apixaban (ELIQUIS) 5 MG TABS tablet; Take 1 tablet (5 mg total) by mouth 2 (two) times daily.  Dispense: 60 tablet; Refill: 6 - diltiazem (CARDIZEM CD) 120 MG 24 hr capsule; Take 1 capsule (120 mg total) by mouth daily.  Dispense: 30 capsule; Refill: 6  3. Other form of dyspnea See #1 above - albuterol (VENTOLIN HFA) 108 (90 Base) MCG/ACT inhaler; Inhale 2 puffs into the lungs every 6 (six) hours as needed for wheezing or shortness of breath.  Dispense: 18 g; Refill: 6  4. Hypokalemia Potassium at discharge was 3.0 Will repeat - Basic Metabolic Panel; Future  5. Tobacco abuse Spent 3 minutes counseling on smoking cessation but he is not ready to quit   Follow Up Instructions: 3 minutes   I discussed the assessment and treatment plan with the patient. The patient was provided an opportunity to ask questions and all were answered. The patient agreed with the plan and demonstrated an understanding of the instructions.   The patient was advised to call back or seek an in-person evaluation if the symptoms worsen or if the condition fails to improve as anticipated.     I provided  21 minutes total of non-face-to-face time during this encounter including median intraservice time, reviewing previous notes, investigations, ordering medications, medical decision making, coordinating care and patient verbalized understanding at the end of the visit.     Hoy Register, MD, FAAFP. [facility] [place], [place] [phone]    09/01/2020, 11:03 AM

## 2020-09-02 ENCOUNTER — Encounter: Payer: Self-pay | Admitting: Family Medicine

## 2020-09-05 ENCOUNTER — Other Ambulatory Visit: Payer: Medicare Other

## 2020-09-27 ENCOUNTER — Other Ambulatory Visit: Payer: Self-pay | Admitting: Family Medicine

## 2020-09-27 DIAGNOSIS — I4891 Unspecified atrial fibrillation: Secondary | ICD-10-CM

## 2020-09-30 ENCOUNTER — Other Ambulatory Visit: Payer: Self-pay | Admitting: Family Medicine

## 2020-09-30 DIAGNOSIS — R0602 Shortness of breath: Secondary | ICD-10-CM

## 2020-10-28 ENCOUNTER — Other Ambulatory Visit: Payer: Self-pay | Admitting: Family Medicine

## 2020-10-28 DIAGNOSIS — I4891 Unspecified atrial fibrillation: Secondary | ICD-10-CM

## 2020-11-10 ENCOUNTER — Telehealth: Payer: Self-pay

## 2020-11-10 NOTE — Telephone Encounter (Signed)
Call received from patient. He said he was upset when he was in the ED most recently because the staff there though he was homeless and he is not homeless. He then explained that it has been more difficult for him to walk around, he recently had a fall  and he is interested in obtaining a power chair.  Explained to him that his room at the motel needs to be able to accommodate the chair and he would need to discuss this further with Dr Alvis Lemmings.  Attempted to schedule him to see Dr Alvis Lemmings 11/17/2020 but the appointment did not register.  Informed him that he can contact [facility] to enroll for transportation to medical appointments.    Attempted to contact the patient again regarding scheduling the appointment with Dr Alvis Lemmings.  He did not answer his phone.  This CM then spoke to Debbie/front desk at [facility].  She said she would give the patient a message to have him return the call to this CM.

## 2020-11-11 NOTE — Telephone Encounter (Signed)
Patient has returned call to J. Brazeau Patient would like to be contacted again when possible

## 2020-11-14 NOTE — Telephone Encounter (Signed)
Call placed to patient and scheduled his appointment with Dr Alvis Lemmings- 11/30/2020.  He did not want to be seen by another provider in order to be seen sooner.   Appointment reminded mailed to his as he requested.

## 2020-11-30 ENCOUNTER — Other Ambulatory Visit: Payer: Self-pay

## 2020-11-30 ENCOUNTER — Ambulatory Visit: Payer: Medicare Other | Attending: Family Medicine | Admitting: Family Medicine

## 2020-11-30 ENCOUNTER — Encounter: Payer: Self-pay | Admitting: Family Medicine

## 2020-11-30 VITALS — BP 125/68 | HR 81 | Ht 73.0 in

## 2020-11-30 DIAGNOSIS — R42 Dizziness and giddiness: Secondary | ICD-10-CM | POA: Diagnosis not present

## 2020-11-30 DIAGNOSIS — T502X5A Adverse effect of carbonic-anhydrase inhibitors, benzothiadiazides and other diuretics, initial encounter: Secondary | ICD-10-CM | POA: Insufficient documentation

## 2020-11-30 DIAGNOSIS — E876 Hypokalemia: Secondary | ICD-10-CM | POA: Diagnosis not present

## 2020-11-30 DIAGNOSIS — R0602 Shortness of breath: Secondary | ICD-10-CM | POA: Diagnosis not present

## 2020-11-30 DIAGNOSIS — J90 Pleural effusion, not elsewhere classified: Secondary | ICD-10-CM | POA: Insufficient documentation

## 2020-11-30 DIAGNOSIS — R5383 Other fatigue: Secondary | ICD-10-CM | POA: Insufficient documentation

## 2020-11-30 DIAGNOSIS — Z79899 Other long term (current) drug therapy: Secondary | ICD-10-CM | POA: Diagnosis not present

## 2020-11-30 DIAGNOSIS — E559 Vitamin D deficiency, unspecified: Secondary | ICD-10-CM | POA: Insufficient documentation

## 2020-11-30 DIAGNOSIS — I4891 Unspecified atrial fibrillation: Secondary | ICD-10-CM | POA: Diagnosis not present

## 2020-11-30 DIAGNOSIS — H6121 Impacted cerumen, right ear: Secondary | ICD-10-CM

## 2020-11-30 DIAGNOSIS — Z7901 Long term (current) use of anticoagulants: Secondary | ICD-10-CM | POA: Insufficient documentation

## 2020-11-30 DIAGNOSIS — R531 Weakness: Secondary | ICD-10-CM | POA: Insufficient documentation

## 2020-11-30 DIAGNOSIS — R269 Unspecified abnormalities of gait and mobility: Secondary | ICD-10-CM | POA: Insufficient documentation

## 2020-11-30 DIAGNOSIS — I5032 Chronic diastolic (congestive) heart failure: Secondary | ICD-10-CM | POA: Insufficient documentation

## 2020-11-30 MED ORDER — ELIQUIS 5 MG PO TABS
5.0000 mg | ORAL_TABLET | Freq: Two times a day (BID) | ORAL | 6 refills | Status: DC
Start: 2020-11-30 — End: 2021-02-12

## 2020-11-30 MED ORDER — ALBUTEROL SULFATE HFA 108 (90 BASE) MCG/ACT IN AERS: 2.0000 | INHALATION_SPRAY | Freq: Four times a day (QID) | RESPIRATORY_TRACT | 6 refills | Status: AC | PRN

## 2020-11-30 MED ORDER — SPIRIVA RESPIMAT 2.5 MCG/ACT IN AERS
INHALATION_SPRAY | RESPIRATORY_TRACT | 6 refills | Status: DC
Start: 2020-11-30 — End: 2021-07-06

## 2020-11-30 MED ORDER — DILTIAZEM HCL ER COATED BEADS 120 MG PO CP24: 120.0000 mg | ORAL_CAPSULE | Freq: Every day | ORAL | 6 refills | Status: DC

## 2020-11-30 MED ORDER — DEBROX 6.5 % OT SOLN: 5.0000 [drp] | Freq: Two times a day (BID) | OTIC | 1 refills | Status: DC

## 2020-11-30 MED ORDER — DILTIAZEM HCL ER 90 MG PO CP12: 90.0000 mg | ORAL_CAPSULE | Freq: Two times a day (BID) | ORAL | 6 refills | Status: DC

## 2020-11-30 MED ORDER — FUROSEMIDE 40 MG PO TABS: 40.0000 mg | ORAL_TABLET | ORAL | 6 refills | Status: DC

## 2020-11-30 MED ORDER — METOPROLOL TARTRATE 25 MG PO TABS
ORAL_TABLET | ORAL | 6 refills | Status: DC
Start: 2020-11-30 — End: 2021-07-06

## 2020-11-30 NOTE — Progress Notes (Signed)
Subjective:  Patient ID: Alfred Mercado, male    DOB: 29-Sep-1935  Age: 85 y.o. MRN: [id number]  CC:  Chief Complaint  Patient presents with  . Gait Problem    Mobility Assesment      HPI Alfred Mercado is an 85 year old male with a history of atrial fibrillation with RVR, heart failure with preserved ejection fraction (EF 50 to 55%),history of pleural effusion status post thoracocentesis x2here for follow-up visit.He haddeclined subsequent thoracocentesis and is not interested in any future thoracocentesis States he needs something for energy as he gets out of breath on minimal exertion and he gets dizzy on standing from a seated position.  He has weakness in his arms and his legs. He would  Like a power wheelchair which will assist him with his ADLs and he currently has a cane which does not suffice, a walker and manual wheelchair will not suffice.  The pathway to his room is up an incline which is also Adriana Simas DM puts him at risk for additional falls.  He has the mental capacity and willing next to use the proper wheelchair in the home safely.  Denies presence of chest pains, palpitations. Has difficulty hearing out of his R ear Past Medical History:  Diagnosis Date  . Acute heart failure with preserved ejection fraction (HCC)   . Atrial fibrillation with rapid ventricular response (HCC) 07/11/2019    Past Surgical History:  Procedure Laterality Date  . APPENDECTOMY      Family History  Problem Relation Age of Onset  . Aneurysm Father     No Known Allergies  Outpatient Medications Prior to Visit  Medication Sig Dispense Refill  . acetaminophen (TYLENOL) 325 MG tablet Take 650 mg by mouth every 6 (six) hours as needed for mild pain or headache.    . nicotine (NICODERM CQ - DOSED IN MG/24 HOURS) 21 mg/24hr patch Place 1 patch (21 mg total) onto the skin daily. (Patient not taking: Reported on 09/01/2020) 28 patch 0  . polyethylene glycol powder (GLYCOLAX/MIRALAX) 17  GM/SCOOP powder Take 17 g by mouth daily. 3350 g 1  . Vitamin D, Ergocalciferol, (DRISDOL) 1.25 MG (50000 UNIT) CAPS capsule TAKE 1 CAPSULE BY MOUTH 1 TIME A WEEK (Patient taking differently: Take 50,000 Units by mouth every 7 (seven) days. Usually Wednesday) 12 capsule 0  . albuterol (VENTOLIN HFA) 108 (90 Base) MCG/ACT inhaler Inhale 2 puffs into the lungs every 6 (six) hours as needed for wheezing or shortness of breath. 18 g 6  . apixaban (ELIQUIS) 5 MG TABS tablet Take 1 tablet (5 mg total) by mouth 2 (two) times daily. 60 tablet 6  . diltiazem (CARDIZEM CD) 120 MG 24 hr capsule Take 1 capsule (120 mg total) by mouth daily. 30 capsule 6  . furosemide (LASIX) 40 MG tablet TAKE 1 TABLET BY MOUTH DAILY. IF YOUR WEIGHT GOES UP BY 2-3 POUNDS IN A DAY OR 5 POUNDS IN A WEEK, TAKE 1 EXTRA TABLET OF LASIX (Patient taking differently: Take 40 mg by mouth See admin instructions.) 45 tablet 2  . metoprolol tartrate (LOPRESSOR) 25 MG tablet TAKE 1 TABLET(25 MG) BY MOUTH TWICE DAILY 60 tablet 6  . SPIRIVA RESPIMAT 2.5 MCG/ACT AERS INHALE 2 PUFFS BY MOUTH DAILY 4 g 2   No facility-administered medications prior to visit.     ROS Review of Systems  Constitutional: Negative for activity change and appetite change.  HENT: Positive for hearing loss. Negative for sinus pressure and sore throat.  Eyes: Negative for visual disturbance.  Respiratory: Negative for cough, chest tightness and shortness of breath.   Cardiovascular: Negative for chest pain and leg swelling.  Gastrointestinal: Negative for abdominal distention, abdominal pain, constipation and diarrhea.  Endocrine: Negative.   Genitourinary: Negative for dysuria.  Musculoskeletal: Positive for gait problem.  Skin: Negative for rash.  Allergic/Immunologic: Negative.   Neurological: Positive for dizziness and weakness. Negative for light-headedness and numbness.  Psychiatric/Behavioral: Negative for dysphoric mood and suicidal ideas.     Objective:  BP 125/68   Pulse 81   Ht 6\' 1"  (1.854 m)   SpO2 99%   BMI 23.88 kg/m   BP/Weight 11/30/2020 08/30/2020 08/15/2020  Systolic BP 125 152 113  Diastolic BP 68 59 65  Wt. (Lbs) - - -  BMI 23.88 - -    Orthostatic VS for the past 72 hrs (Last 3 readings):  Orthostatic BP Patient Position BP Location Orthostatic Pulse  11/30/20 1202 (!) 87/53 Standing Right Arm 85  11/30/20 1201 (!) 89/54 Sitting Right Arm 79  11/30/20 1159 102/63 Supine Right Arm 77     Physical Exam Constitutional:      Appearance: He is well-developed.  Eyes:     Comments: Cerumen obscuring right tympanic membrane  Neck:     Vascular: No JVD.  Cardiovascular:     Rate and Rhythm: Normal rate.     Heart sounds: Normal heart sounds. No murmur heard.   Pulmonary:     Effort: Pulmonary effort is normal.     Breath sounds: Examination of the right-lower field reveals decreased breath sounds. Decreased breath sounds (slightly decreased) present. No wheezing or rales.  Chest:     Chest wall: No tenderness.  Abdominal:     General: Bowel sounds are normal. There is no distension.     Palpations: Abdomen is soft. There is no mass.     Tenderness: There is no abdominal tenderness.  Musculoskeletal:        General: Normal range of motion.     Right lower leg: No edema.     Left lower leg: No edema.     Comments: Strength in upper extremities-3+/5 Strength in bilateral lower extremities-4/5  Neurological:     Mental Status: He is alert and oriented to person, place, and time.  Psychiatric:        Mood and Affect: Mood normal.     CMP Latest Ref Rng & Units 08/29/2020 08/15/2020 08/14/2020  Glucose 70 - 99 mg/dL 08/16/2020) 789(F) 810(F)  BUN 8 - 23 mg/dL 10 20 20   Creatinine 0.61 - 1.24 mg/dL 751(W ) 2.58)  Sodium 135 - 145 mmol/L 139 135 137  Potassium 3.5 - 5.1 mmol/L 3.0(L) 3.2(L) 3.5  Chloride 98 - 111 mmol/L 103 100 102  CO2 22 - 32 mmol/L 27 25 23   Calcium 8.9 - 10.3 mg/dL 5.27(P)  8.3(L) 8.5(L)  Total Protein 6.5 - 8.1 g/dL - - -  Total Bilirubin 0.3 - 1.2 mg/dL - - -  Alkaline Phos 38 - 126 U/L - - -  AST 15 - 41 U/L - - -  ALT 0 - 44 U/L - - -    Lipid Panel     Component Value Date/Time   CHOL 147 07/13/2019 0457   TRIG 77 07/13/2019 0457   HDL 38 (L) 07/13/2019 0457   CHOLHDL 3.9 07/13/2019 0457   VLDL 15 07/13/2019 0457   LDLCALC 94 07/13/2019 0457    CBC    Component  Value Date/Time   WBC 6.4 08/29/2020 1537   RBC 3.78 (L) 08/29/2020 1537   HGB 10.9 (L) 08/29/2020 1537   HGB 13.4 01/07/2020 1014   HCT 33.1 (L) 08/29/2020 1537   HCT 37.6 01/07/2020 1014   PLT 281 08/29/2020 1537   PLT 284 01/07/2020 1014   MCV 87.6 08/29/2020 1537   MCV 87 01/07/2020 1014   MCH 28.8 08/29/2020 1537   MCHC 32.9 08/29/2020 1537   RDW 13.2 08/29/2020 1537   RDW 12.1 01/07/2020 1014   LYMPHSABS 0.6 (L) 06/02/2020 0853   LYMPHSABS 1.3 01/07/2020 1014   MONOABS 0.9 06/02/2020 0853   EOSABS 0.0 06/02/2020 0853   EOSABS 0.1 01/07/2020 1014   BASOSABS 0.0 06/02/2020 0853   BASOSABS 0.1 01/07/2020 1014    Lab Results  Component Value Date   HGBA1C 5.7 02/25/2020    Assessment & Plan:  1. Atrial fibrillation with rapid ventricular response (HCC) Stable On rate control with diltiazem.  I have decreased the dose due to his hypotension and dizziness Continue Eliquis for anticoagulation - Basic Metabolic Panel - CBC with Differential/Platelet - apixaban (ELIQUIS) 5 MG TABS tablet; Take 1 tablet (5 mg total) by mouth 2 (two) times daily.  Dispense: 60 tablet; Refill: 6 - metoprolol tartrate (LOPRESSOR) 25 MG tablet; TAKE 1 TABLET(25 MG) BY MOUTH TWICE DAILY  Dispense: 60 tablet; Refill: 6 - diltiazem (CARDIZEM SR) 90 MG 12 hr capsule; Take 1 capsule (90 mg total) by mouth 2 (two) times daily.  Dispense: 30 capsule; Refill: 6  2. Exertional shortness of breath He does have underlying right pleural effusion evidence on exam and imaging but declines additional  thoracocentesis - albuterol (VENTOLIN HFA) 108 (90 Base) MCG/ACT inhaler; Inhale 2 puffs into the lungs every 6 (six) hours as needed for wheezing or shortness of breath.  Dispense: 18 g; Refill: 6 - Tiotropium Bromide Monohydrate (SPIRIVA RESPIMAT) 2.5 MCG/ACT AERS; INHALE 2 PUFFS BY MOUTH DAILY  Dispense: 4 g; Refill: 6  3. Other fatigue - VITAMIN D 25 Hydroxy (Vit-D Deficiency, Fractures)  4. Pleural effusion Declines thoracocentesis Continue Lasix - furosemide (LASIX) 40 MG tablet; Take 1 tablet (40 mg total) by mouth See admin instructions.  Dispense: 30 tablet; Refill: 6  5. Hypokalemia Diuretic induced We will check potassium level  6. Gait abnormality He is at high risk for falls I have written a prescription for power wheelchair for him as he does qualify  7. Impacted cerumen of right ear Ear irrigation performed in clinic - carbamide peroxide (DEBROX) 6.5 % OTIC solution; Place 5 drops into the right ear 2 (two) times daily.  Dispense: 15 mL; Refill: 1    Meds ordered this encounter  Medications  . apixaban (ELIQUIS) 5 MG TABS tablet    Sig: Take 1 tablet (5 mg total) by mouth 2 (two) times daily.    Dispense:  60 tablet    Refill:  6  . albuterol (VENTOLIN HFA) 108 (90 Base) MCG/ACT inhaler    Sig: Inhale 2 puffs into the lungs every 6 (six) hours as needed for wheezing or shortness of breath.    Dispense:  18 g    Refill:  6  . diltiazem (CARDIZEM CD) 120 MG 24 hr capsule    Sig: Take 1 capsule (120 mg total) by mouth daily.    Dispense:  30 capsule    Refill:  6  . furosemide (LASIX) 40 MG tablet    Sig: Take 1 tablet (40 mg total)  by mouth See admin instructions.    Dispense:  30 tablet    Refill:  6  . metoprolol tartrate (LOPRESSOR) 25 MG tablet    Sig: TAKE 1 TABLET(25 MG) BY MOUTH TWICE DAILY    Dispense:  60 tablet    Refill:  6  . Tiotropium Bromide Monohydrate (SPIRIVA RESPIMAT) 2.5 MCG/ACT AERS    Sig: INHALE 2 PUFFS BY MOUTH DAILY    Dispense:   4 g    Refill:  6    Follow-up: Return in about 3 months (around 03/01/2021) for chronic medical conditions.       Hoy Register, MD, FAAFP. [facility] [place], [place] [phone]   11/30/2020, 11:37 AM

## 2020-11-30 NOTE — Patient Instructions (Signed)
Ear Irrigation Ear irrigation is a procedure to wash dirt and wax out of your ear canal. This procedure is also called lavage. You may need ear irrigation if you are having trouble hearing because of a buildup of earwax. You may also have ear irrigation as part of the treatment for an ear infection. Getting wax and dirt out of your ear canal can help ear drops work better. Tell a health care provider about:  Any allergies you have.  All medicines you are taking, including vitamins, herbs, eye drops, creams, and over-the-counter medicines.  Any problems you or family members have had with anesthetic medicines.  Any blood disorders you have.  Any surgeries you have had. This includes any ear surgeries.  Any medical conditions you have.  Whether you are pregnant or may be pregnant. What are the risks? Generally, this is a safe procedure. However, problems may occur, including:  Infection.  Pain.  Hearing loss.  Fluid and debris being pushed through the eardrum and into the middle ear. This can occur if there are holes in the eardrum.  Ear irrigation failing to work. What happens before the procedure?  You will talk with your provider about the procedure and plan.  You may be given ear drops to put in your ear 15-20 minutes before irrigation. This helps loosen the wax. What happens during the procedure?  A syringe is filled with water or saline solution, which is made of salt and water.  The syringe is gently inserted into the ear canal.  The fluid is used to flush out wax and other debris. The procedure may vary among health care providers and hospitals.   What can I expect after the procedure? After an ear irrigation, follow instructions given to you by your health care provider. Follow these instructions at home: Using ear irrigation kits Ear irrigation kits are available for use at home. Ask your health care provider if this is an option for you. In general, you  should:  Use a home irrigation kit only as told by your health care provider.  Read the package instructions carefully.  Follow the directions for using the syringe.  Use water that is room temperature. Do not do ear irrigation at home if you:  Have diabetes. Diabetes increases the risk of infection.  Have a hole or tear in your eardrum.  Have tubes in your ears.  Have had any ear surgery in the past.  Have been told not to irrigate your ears. Cleaning your ears  Clean the outside of your ear with a soft washcloth daily.  If told by your health care provider, use a few drops of baby oil, mineral oil, glycerin, hydrogen peroxide, or over-the-counter earwax softening drops.  Do not use cotton swabs to clean your ears. These can push wax down into the ear canal.  Do not put anything into your ears to try to remove wax. This includes ear candles.   General instructions  Take over-the-counter and prescription medicines only as told by your health care provider.  If you were prescribed an antibiotic medicine, use it as told by your health care provider. Do not stop using the antibiotic even if your condition improves.  Keep the ear clean and dry by following the instructions from your health care provider.  Keep all follow-up visits. This is important.  Visit your health care provider at least once a year to have your ears and hearing checked. Contact a health care provider if:  Your   hearing is not improving or is getting worse.  You have pain or redness in your ear.  You are dizzy.  You have ringing in your ears.  You have nausea or vomiting.  You have fluid, blood, or pus coming out of your ear. Summary  Ear irrigation is a procedure to wash dirt and wax out of your ear canal. This procedure is also called lavage.  To perform ear irrigation, ear drops may be put in your ear 15-20 minutes before irrigation. Water or saline solution will be used to flush out earwax  and other debris.  You may be able to irrigate your ears at home. Ask your health care provider if this is an option for you. Follow your health care provider's instructions.  Clean your ears with a soft cloth after irrigation. Do not use cotton swabs to clean your ears. These can push wax down into the ear canal. This information is not intended to replace advice given to you by your health care provider. Make sure you discuss any questions you have with your health care provider. Document Revised: 12/01/2019 Document Reviewed: 12/01/2019 Elsevier Patient Education  [place].

## 2020-12-01 ENCOUNTER — Other Ambulatory Visit: Payer: Self-pay | Admitting: Family Medicine

## 2020-12-01 DIAGNOSIS — E559 Vitamin D deficiency, unspecified: Secondary | ICD-10-CM

## 2020-12-01 LAB — CBC WITH DIFFERENTIAL/PLATELET
Basophils Absolute: 0.1 10*3/uL (ref 0.0–0.2)
Basos: 1 %
EOS (ABSOLUTE): 0.1 10*3/uL (ref 0.0–0.4)
Eos: 1 %
Hematocrit: 30.6 % — ABNORMAL LOW (ref 37.5–51.0)
Hemoglobin: 10.1 g/dL — ABNORMAL LOW (ref 13.0–17.7)
Immature Grans (Abs): 0.1 10*3/uL (ref 0.0–0.1)
Immature Granulocytes: 1 %
Lymphocytes Absolute: 1 10*3/uL (ref 0.7–3.1)
Lymphs: 13 %
MCH: 26.2 pg — ABNORMAL LOW (ref 26.6–33.0)
MCHC: 33 g/dL (ref 31.5–35.7)
MCV: 80 fL (ref 79–97)
Monocytes Absolute: 0.6 10*3/uL (ref 0.1–0.9)
Monocytes: 7 %
Neutrophils Absolute: 6.3 10*3/uL (ref 1.4–7.0)
Neutrophils: 77 %
Platelets: 346 10*3/uL (ref 150–450)
RBC: 3.85 x10E6/uL — ABNORMAL LOW (ref 4.14–5.80)
RDW: 13.6 % (ref 11.6–15.4)
WBC: 8.2 10*3/uL (ref 3.4–10.8)

## 2020-12-01 LAB — VITAMIN D 25 HYDROXY (VIT D DEFICIENCY, FRACTURES): Vit D, 25-Hydroxy: 22.5 ng/mL — ABNORMAL LOW (ref 30.0–100.0)

## 2020-12-01 LAB — BASIC METABOLIC PANEL
BUN/Creatinine Ratio: 9 — ABNORMAL LOW (ref 10–24)
BUN: 11 mg/dL (ref 8–27)
CO2: 21 mmol/L (ref 20–29)
Calcium: 8.9 mg/dL (ref 8.6–10.2)
Chloride: 100 mmol/L (ref 96–106)
Creatinine, Ser: 1.28 mg/dL — ABNORMAL HIGH (ref 0.76–1.27)
Glucose: 101 mg/dL — ABNORMAL HIGH (ref 65–99)
Potassium: 4.1 mmol/L (ref 3.5–5.2)
Sodium: 140 mmol/L (ref 134–144)
eGFR: 56 mL/min/{1.73_m2} — ABNORMAL LOW (ref >59)

## 2020-12-01 MED ORDER — MISC. DEVICES MISC: 0 refills | Status: AC

## 2020-12-01 MED ORDER — VITAMIN D (ERGOCALCIFEROL) 1.25 MG (50000 UNIT) PO CAPS: ORAL_CAPSULE | ORAL | 0 refills | Status: DC

## 2020-12-02 ENCOUNTER — Other Ambulatory Visit: Payer: Self-pay

## 2020-12-02 ENCOUNTER — Telehealth: Payer: Self-pay

## 2020-12-02 DIAGNOSIS — Z1211 Encounter for screening for malignant neoplasm of colon: Secondary | ICD-10-CM

## 2020-12-02 NOTE — Telephone Encounter (Signed)
Patient name and DOB has been verified Patient was informed of lab results. Patient had no questions.  

## 2020-12-02 NOTE — Telephone Encounter (Signed)
-----   Message from Hoy Register, MD sent at 12/01/2020  5:04 PM EDT ----- Labs reveal low vitamin D and I have sent a rx for Drisdol to his Pharmacy. He is also anemic which could explain his fatigue. Can you please order a Cologuard test for him? Thanks

## 2020-12-05 ENCOUNTER — Telehealth: Payer: Self-pay | Admitting: Family Medicine

## 2020-12-05 NOTE — Telephone Encounter (Signed)
Copied from CRM [id number]. Topic: General - Other >> Dec 05, 2020  8:53 AM Jaquita Rector A wrote: Reason for CRM: Patient called in requesting to speak to Erskine Squibb say that there are some things he need to speak to her about. Please call patient at Ph# [phone] ext [phone]

## 2020-12-05 NOTE — Telephone Encounter (Signed)
Call placed to patient.  He was inquiring about receiving assistance from an aide in his home to help with bathing, personal care. Explained to him that he may qualify for PCS with medicaid and there would be no cost to him. He said he will think about it and call this CM back.

## 2020-12-20 ENCOUNTER — Inpatient Hospital Stay: Payer: Medicare Other | Admitting: Family Medicine

## 2021-01-20 ENCOUNTER — Ambulatory Visit (HOSPITAL_BASED_OUTPATIENT_CLINIC_OR_DEPARTMENT_OTHER): Payer: Medicare Other | Admitting: *Deleted

## 2021-01-20 NOTE — Telephone Encounter (Signed)
  Reason for Disposition . [1] Landed hard on feet or buttocks AND [2] pain over spine  Protocols used: BACK INJURY-A-AH

## 2021-01-20 NOTE — Telephone Encounter (Signed)
FYI
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
## 2021-01-20 NOTE — Addendum Note (Signed)
Addended byHoy Register on: 01/20/2021 01:11 PM   Modules accepted: Level of Service

## 2021-01-24 ENCOUNTER — Emergency Department (HOSPITAL_COMMUNITY): Payer: Medicare Other

## 2021-01-24 ENCOUNTER — Other Ambulatory Visit: Payer: Self-pay

## 2021-01-24 ENCOUNTER — Encounter (HOSPITAL_COMMUNITY): Payer: Self-pay

## 2021-01-24 ENCOUNTER — Ambulatory Visit (HOSPITAL_COMMUNITY)
Admission: EM | Admit: 2021-01-24 | Discharge: 2021-01-24 | Disposition: A | Discharge status: Home / Self Care | Payer: Medicare Other | Attending: Medical Oncology | Admitting: Medical Oncology

## 2021-01-24 ENCOUNTER — Inpatient Hospital Stay (HOSPITAL_COMMUNITY): Payer: Medicare Other

## 2021-01-24 ENCOUNTER — Inpatient Hospital Stay (HOSPITAL_COMMUNITY)
Admission: EM | Admit: 2021-01-24 | Discharge: 2021-02-02 | DRG: 690 | Disposition: A | Discharge status: Home / Self Care | Payer: Medicare Other | Attending: Internal Medicine | Admitting: Internal Medicine

## 2021-01-24 ENCOUNTER — Encounter (HOSPITAL_COMMUNITY): Payer: Self-pay | Admitting: Emergency Medicine

## 2021-01-24 DIAGNOSIS — D509 Iron deficiency anemia, unspecified: Secondary | ICD-10-CM | POA: Diagnosis present

## 2021-01-24 DIAGNOSIS — N151 Renal and perinephric abscess: Secondary | ICD-10-CM | POA: Diagnosis present

## 2021-01-24 DIAGNOSIS — L299 Pruritus, unspecified: Secondary | ICD-10-CM | POA: Diagnosis not present

## 2021-01-24 DIAGNOSIS — W19XXXA Unspecified fall, initial encounter: Secondary | ICD-10-CM

## 2021-01-24 DIAGNOSIS — N390 Urinary tract infection, site not specified: Secondary | ICD-10-CM | POA: Diagnosis present

## 2021-01-24 DIAGNOSIS — I5032 Chronic diastolic (congestive) heart failure: Secondary | ICD-10-CM | POA: Diagnosis present

## 2021-01-24 DIAGNOSIS — R634 Abnormal weight loss: Secondary | ICD-10-CM | POA: Diagnosis present

## 2021-01-24 DIAGNOSIS — R410 Disorientation, unspecified: Secondary | ICD-10-CM | POA: Diagnosis not present

## 2021-01-24 DIAGNOSIS — S32001A Stable burst fracture of unspecified lumbar vertebra, initial encounter for closed fracture: Secondary | ICD-10-CM | POA: Diagnosis not present

## 2021-01-24 DIAGNOSIS — M545 Low back pain, unspecified: Secondary | ICD-10-CM

## 2021-01-24 DIAGNOSIS — Z79899 Other long term (current) drug therapy: Secondary | ICD-10-CM

## 2021-01-24 DIAGNOSIS — F17210 Nicotine dependence, cigarettes, uncomplicated: Secondary | ICD-10-CM | POA: Diagnosis present

## 2021-01-24 DIAGNOSIS — L989 Disorder of the skin and subcutaneous tissue, unspecified: Secondary | ICD-10-CM | POA: Diagnosis present

## 2021-01-24 DIAGNOSIS — I4819 Other persistent atrial fibrillation: Secondary | ICD-10-CM | POA: Diagnosis present

## 2021-01-24 DIAGNOSIS — K5900 Constipation, unspecified: Secondary | ICD-10-CM | POA: Diagnosis not present

## 2021-01-24 DIAGNOSIS — J449 Chronic obstructive pulmonary disease, unspecified: Secondary | ICD-10-CM | POA: Diagnosis present

## 2021-01-24 DIAGNOSIS — I959 Hypotension, unspecified: Secondary | ICD-10-CM

## 2021-01-24 DIAGNOSIS — I719 Aortic aneurysm of unspecified site, without rupture: Secondary | ICD-10-CM | POA: Diagnosis present

## 2021-01-24 DIAGNOSIS — R296 Repeated falls: Secondary | ICD-10-CM | POA: Diagnosis present

## 2021-01-24 DIAGNOSIS — N2889 Other specified disorders of kidney and ureter: Secondary | ICD-10-CM

## 2021-01-24 DIAGNOSIS — N200 Calculus of kidney: Secondary | ICD-10-CM | POA: Diagnosis present

## 2021-01-24 DIAGNOSIS — N179 Acute kidney failure, unspecified: Secondary | ICD-10-CM | POA: Diagnosis present

## 2021-01-24 DIAGNOSIS — K449 Diaphragmatic hernia without obstruction or gangrene: Secondary | ICD-10-CM | POA: Diagnosis present

## 2021-01-24 DIAGNOSIS — R319 Hematuria, unspecified: Secondary | ICD-10-CM

## 2021-01-24 DIAGNOSIS — H6121 Impacted cerumen, right ear: Secondary | ICD-10-CM | POA: Diagnosis present

## 2021-01-24 DIAGNOSIS — R531 Weakness: Secondary | ICD-10-CM

## 2021-01-24 DIAGNOSIS — N320 Bladder-neck obstruction: Secondary | ICD-10-CM | POA: Diagnosis present

## 2021-01-24 DIAGNOSIS — N1831 Chronic kidney disease, stage 3a: Secondary | ICD-10-CM | POA: Diagnosis present

## 2021-01-24 DIAGNOSIS — A419 Sepsis, unspecified organism: Secondary | ICD-10-CM | POA: Diagnosis not present

## 2021-01-24 DIAGNOSIS — R3129 Other microscopic hematuria: Secondary | ICD-10-CM | POA: Diagnosis not present

## 2021-01-24 DIAGNOSIS — R6520 Severe sepsis without septic shock: Secondary | ICD-10-CM | POA: Diagnosis present

## 2021-01-24 DIAGNOSIS — N12 Tubulo-interstitial nephritis, not specified as acute or chronic: Principal | ICD-10-CM | POA: Diagnosis present

## 2021-01-24 DIAGNOSIS — Z7901 Long term (current) use of anticoagulants: Secondary | ICD-10-CM | POA: Diagnosis not present

## 2021-01-24 DIAGNOSIS — Z20822 Contact with and (suspected) exposure to covid-19: Secondary | ICD-10-CM | POA: Diagnosis present

## 2021-01-24 DIAGNOSIS — K4090 Unilateral inguinal hernia, without obstruction or gangrene, not specified as recurrent: Secondary | ICD-10-CM | POA: Diagnosis present

## 2021-01-24 DIAGNOSIS — N189 Chronic kidney disease, unspecified: Secondary | ICD-10-CM | POA: Diagnosis present

## 2021-01-24 DIAGNOSIS — F419 Anxiety disorder, unspecified: Secondary | ICD-10-CM | POA: Diagnosis present

## 2021-01-24 DIAGNOSIS — I714 Abdominal aortic aneurysm, without rupture, unspecified: Secondary | ICD-10-CM

## 2021-01-24 DIAGNOSIS — H5711 Ocular pain, right eye: Secondary | ICD-10-CM | POA: Diagnosis not present

## 2021-01-24 DIAGNOSIS — I2510 Atherosclerotic heart disease of native coronary artery without angina pectoris: Secondary | ICD-10-CM | POA: Diagnosis present

## 2021-01-24 DIAGNOSIS — E876 Hypokalemia: Secondary | ICD-10-CM | POA: Diagnosis present

## 2021-01-24 DIAGNOSIS — S32011A Stable burst fracture of first lumbar vertebra, initial encounter for closed fracture: Secondary | ICD-10-CM | POA: Diagnosis present

## 2021-01-24 DIAGNOSIS — S32001S Stable burst fracture of unspecified lumbar vertebra, sequela: Secondary | ICD-10-CM

## 2021-01-24 DIAGNOSIS — N1832 Chronic kidney disease, stage 3b: Secondary | ICD-10-CM | POA: Diagnosis not present

## 2021-01-24 DIAGNOSIS — Z6823 Body mass index (BMI) 23.0-23.9, adult: Secondary | ICD-10-CM

## 2021-01-24 LAB — CBG MONITORING, ED: Glucose-Capillary: 106 mg/dL — ABNORMAL HIGH (ref 70–99)

## 2021-01-24 LAB — URINALYSIS, ROUTINE W REFLEX MICROSCOPIC
Bilirubin Urine: NEGATIVE
Glucose, UA: NEGATIVE mg/dL
Ketones, ur: NEGATIVE mg/dL
Nitrite: NEGATIVE
Protein, ur: 30 mg/dL — AB
RBC / HPF: >50 RBC/hpf — ABNORMAL HIGH (ref 0–5)
Specific Gravity, Urine: 1.009 (ref 1.005–1.030)
WBC, UA: >50 WBC/hpf — ABNORMAL HIGH (ref 0–5)
pH: 5 (ref 5.0–8.0)

## 2021-01-24 LAB — CBC
HCT: 31.3 % — ABNORMAL LOW (ref 39.0–52.0)
Hemoglobin: 9.8 g/dL — ABNORMAL LOW (ref 13.0–17.0)
MCH: 24.9 pg — ABNORMAL LOW (ref 26.0–34.0)
MCHC: 31.3 g/dL (ref 30.0–36.0)
MCV: 79.6 fL — ABNORMAL LOW (ref 80.0–100.0)
Platelets: 395 10*3/uL (ref 150–400)
RBC: 3.93 MIL/uL — ABNORMAL LOW (ref 4.22–5.81)
RDW: 14.4 % (ref 11.5–15.5)
WBC: 9.7 10*3/uL (ref 4.0–10.5)
nRBC: 0 % (ref 0.0–0.2)

## 2021-01-24 LAB — HEPATIC FUNCTION PANEL
ALT: 13 U/L (ref 0–44)
AST: 14 U/L — ABNORMAL LOW (ref 15–41)
Albumin: 2.8 g/dL — ABNORMAL LOW (ref 3.5–5.0)
Alkaline Phosphatase: 63 U/L (ref 38–126)
Bilirubin, Direct: 0.1 mg/dL (ref 0.0–0.2)
Indirect Bilirubin: 0.5 mg/dL (ref 0.3–0.9)
Total Bilirubin: 0.6 mg/dL (ref 0.3–1.2)
Total Protein: 6.3 g/dL — ABNORMAL LOW (ref 6.5–8.1)

## 2021-01-24 LAB — BASIC METABOLIC PANEL
Anion gap: 11 (ref 5–15)
BUN: 24 mg/dL — ABNORMAL HIGH (ref 8–23)
CO2: 24 mmol/L (ref 22–32)
Calcium: 8.9 mg/dL (ref 8.9–10.3)
Chloride: 101 mmol/L (ref 98–111)
Creatinine, Ser: 2.11 mg/dL — ABNORMAL HIGH (ref 0.61–1.24)
GFR, Estimated: 30 mL/min — ABNORMAL LOW (ref >60)
Glucose, Bld: 120 mg/dL — ABNORMAL HIGH (ref 70–99)
Potassium: 3.2 mmol/L — ABNORMAL LOW (ref 3.5–5.1)
Sodium: 136 mmol/L (ref 135–145)

## 2021-01-24 LAB — TROPONIN I (HIGH SENSITIVITY)
Troponin I (High Sensitivity): 13 ng/L (ref <18)
Troponin I (High Sensitivity): 12 ng/L (ref <18)

## 2021-01-24 LAB — LIPASE, BLOOD: Lipase: 20 U/L (ref 11–51)

## 2021-01-24 MED ORDER — SODIUM CHLORIDE 0.9 % IV BOLUS: 1000.0000 mL | Freq: Once | INTRAVENOUS | Status: DC

## 2021-01-24 MED ORDER — UMECLIDINIUM BROMIDE 62.5 MCG/INH IN AEPB
1.0000 | INHALATION_SPRAY | Freq: Every day | RESPIRATORY_TRACT | Status: DC
  Administered 2021-01-27 – 2021-02-01 (×5): 1 via RESPIRATORY_TRACT
  Filled 2021-01-24 (×2): qty 7

## 2021-01-24 MED ORDER — SODIUM CHLORIDE 0.9 % IV SOLN
1.0000 g | Freq: Once | INTRAVENOUS | Status: AC
  Administered 2021-01-24: 1 g via INTRAVENOUS
  Filled 2021-01-24: qty 10

## 2021-01-24 MED ORDER — ALBUTEROL SULFATE HFA 108 (90 BASE) MCG/ACT IN AERS: 2.0000 | INHALATION_SPRAY | Freq: Four times a day (QID) | RESPIRATORY_TRACT | Status: DC | PRN

## 2021-01-24 MED ORDER — ONDANSETRON HCL 4 MG/2ML IJ SOLN
4.0000 mg | Freq: Once | INTRAMUSCULAR | Status: AC
  Administered 2021-01-24: 4 mg via INTRAVENOUS
  Filled 2021-01-24: qty 2

## 2021-01-24 MED ORDER — SODIUM CHLORIDE 0.9 % IV SOLN
2.0000 g | INTRAVENOUS | Status: DC
  Administered 2021-01-24 – 2021-01-30 (×7): 2 g via INTRAVENOUS
  Filled 2021-01-24 (×7): qty 2

## 2021-01-24 MED ORDER — SODIUM CHLORIDE 0.9 % IV BOLUS
500.0000 mL | Freq: Once | INTRAVENOUS | Status: AC
  Administered 2021-01-24: 500 mL via INTRAVENOUS

## 2021-01-24 MED ORDER — ACETAMINOPHEN 325 MG PO TABS
650.0000 mg | ORAL_TABLET | Freq: Four times a day (QID) | ORAL | Status: DC | PRN
  Administered 2021-01-27 – 2021-01-30 (×3): 650 mg via ORAL
  Filled 2021-01-24 (×3): qty 2

## 2021-01-24 MED ORDER — ACETAMINOPHEN 650 MG RE SUPP: 650.0000 mg | Freq: Four times a day (QID) | RECTAL | Status: DC | PRN

## 2021-01-24 MED ORDER — BISACODYL 5 MG PO TBEC
20.0000 mg | DELAYED_RELEASE_TABLET | Freq: Every day | ORAL | Status: DC | PRN
  Administered 2021-01-27: 20 mg via ORAL
  Filled 2021-01-24: qty 4

## 2021-01-24 MED ORDER — ALBUTEROL SULFATE (2.5 MG/3ML) 0.083% IN NEBU: 2.5000 mg | INHALATION_SOLUTION | Freq: Four times a day (QID) | RESPIRATORY_TRACT | Status: DC | PRN

## 2021-01-24 MED ORDER — FENTANYL CITRATE (PF) 100 MCG/2ML IJ SOLN
50.0000 ug | Freq: Once | INTRAMUSCULAR | Status: AC
Start: 2021-01-24 — End: 2021-01-24
  Administered 2021-01-24: 50 ug via INTRAVENOUS
  Filled 2021-01-24: qty 2

## 2021-01-24 MED ORDER — POTASSIUM CHLORIDE CRYS ER 20 MEQ PO TBCR
40.0000 meq | EXTENDED_RELEASE_TABLET | Freq: Two times a day (BID) | ORAL | Status: AC
  Administered 2021-01-25: 40 meq via ORAL
  Filled 2021-01-24 (×2): qty 2

## 2021-01-24 MED ORDER — LACTATED RINGERS IV BOLUS
500.0000 mL | Freq: Once | INTRAVENOUS | Status: AC
  Administered 2021-01-25: 500 mL via INTRAVENOUS

## 2021-01-24 MED ORDER — NICOTINE 7 MG/24HR TD PT24
7.0000 mg | MEDICATED_PATCH | Freq: Every day | TRANSDERMAL | Status: DC
  Administered 2021-01-25 – 2021-02-02 (×10): 7 mg via TRANSDERMAL
  Filled 2021-01-24 (×10): qty 1

## 2021-01-24 NOTE — Progress Notes (Addendum)
85 yo man admitted with findings of RIGHT staghorn calculus with gas in collecting system and bladder.  Also with cystic lesion on LEFT kidney.  UA consistent with infection and bladder distended on imaging.  CT abd/pelvis and labwork reviewed.    Urology recs: 1. Place foley to decompress bladder 2. NPO at midnight and hold blood thinners 3. Empiric antibiotics based on last positive culture results 4. Consult IR for RIGHT PCN placement given staghorn stone in presence of gas in collecting system  5. MRI kidney to further assess left renal mass   Full consult note to follow tomorrow

## 2021-01-24 NOTE — ED Provider Notes (Signed)
[facility]    CSN: [id number] Arrival date & time: 01/24/21  1032      History   Chief Complaint No chief complaint on file. Falling  HPI Alfred Mercado is a 85 y.o. male.   HPI  Falling: Patient reports that he is having back pain following a fall this Saturday and this morning.  He states that he hurt his back during both encounters.  He is unsure why he has been falling but he states he also has been a bit confused.  He has felt weak and fatigued.  He denies any chest pain or shortness of breath. Of note he is on blood thinners. He does not recall hitting his head or having LOC.    Past Medical History:  Diagnosis Date  . Acute heart failure with preserved ejection fraction (HCC)   . Atrial fibrillation with rapid ventricular response (HCC) 07/11/2019    Patient Active Problem List   Diagnosis Date Noted  . Acute respiratory failure with hypoxia (HCC) 08/13/2020  . UTI (urinary tract infection) 06/02/2020  . Community acquired pneumonia of right lower lobe of lung 06/02/2020  . Acute hypoxemic respiratory failure (HCC) 06/02/2020  . Hypokalemia 06/02/2020  . Acute diastolic CHF (congestive heart failure) (HCC) 02/06/2020  . Exertional shortness of breath 10/14/2019  . Atrial fibrillation with RVR (HCC) 10/14/2019  . Pleural effusion   . Chronic heart failure with preserved ejection fraction [facility])     Past Surgical History:  Procedure Laterality Date  . APPENDECTOMY         Home Medications    Prior to Admission medications   Medication Sig Start Date End Date Taking? Authorizing Provider  metoprolol tartrate (LOPRESSOR) 25 MG tablet TAKE 1 TABLET(25 MG) BY MOUTH TWICE DAILY 11/30/20  Yes Newlin, Enobong, MD  polyethylene glycol powder (GLYCOLAX/MIRALAX) 17 GM/SCOOP powder Take 17 g by mouth daily. 03/02/20  Yes Hoy Register, MD  acetaminophen (TYLENOL) 325 MG tablet Take 650 mg by mouth every 6 (six) hours as needed for mild pain or headache.     [provider]  albuterol (VENTOLIN HFA) 108 (90 Base) MCG/ACT inhaler Inhale 2 puffs into the lungs every 6 (six) hours as needed for wheezing or shortness of breath. 11/30/20   Hoy Register, MD  apixaban (ELIQUIS) 5 MG TABS tablet Take 1 tablet (5 mg total) by mouth 2 (two) times daily. 11/30/20   Hoy Register, MD  carbamide peroxide (DEBROX) 6.5 % OTIC solution Place 5 drops into the right ear 2 (two) times daily. 11/30/20   Hoy Register, MD  diltiazem (CARDIZEM SR) 90 MG 12 hr capsule Take 1 capsule (90 mg total) by mouth 2 (two) times daily. 11/30/20   Hoy Register, MD  furosemide (LASIX) 40 MG tablet Take 1 tablet (40 mg total) by mouth See admin instructions. 11/30/20   Hoy Register, MD  Misc. Devices MISC Power wheelchair.  Diagnosis atrial fibrillation, gait abnormality 12/01/20   Hoy Register, MD  nicotine (NICODERM CQ - DOSED IN MG/24 HOURS) 21 mg/24hr patch Place 1 patch (21 mg total) onto the skin daily. Patient not taking: Reported on 09/01/2020 06/07/20   Hoy Register, MD  nicotine (NICODERM CQ - DOSED IN MG/24 HOURS) 21 mg/24hr patch PLACE 1 PATCH (21 MG TOTAL) ONTO THE SKIN DAILY. 06/06/20 06/06/21  Dolan Amen, MD  nitrofurantoin, macrocrystal-monohydrate, (MACROBID) 100 MG capsule TAKE 1 CAPSULE (100 MG TOTAL) BY MOUTH EVERY TWELVE HOURS. 06/06/20 06/06/21  Dolan Amen, MD  Tiotropium Bromide Monohydrate (  SPIRIVA RESPIMAT) 2.5 MCG/ACT AERS INHALE 2 PUFFS BY MOUTH DAILY 11/30/20   Hoy Register, MD  Vitamin D, Ergocalciferol, (DRISDOL) 1.25 MG (50000 UNIT) CAPS capsule TAKE 1 CAPSULE BY MOUTH 1 TIME A WEEK 12/01/20   Hoy Register, MD    Family History Family History  Problem Relation Age of Onset  . Aneurysm Father     Social History Social History   Tobacco Use  . Smoking status: Light Tobacco Smoker  . Smokeless tobacco: Never Used  Substance Use Topics  . Alcohol use: Not Currently  . Drug use: Never     Allergies   Patient has no known  allergies.   Review of Systems Review of Systems  As stated above in HPI Physical Exam Triage Vital Signs ED Triage Vitals  Enc Vitals Group     BP 01/24/21 1158 (!) 69/46     Pulse Rate 01/24/21 1158 (!) 103     Resp 01/24/21 1158 17     Temp --      Temp src --      SpO2 01/24/21 1158 100 %     Weight --      Height --      Head Circumference --      Peak Flow --      Pain Score 01/24/21 1152 6     Pain Loc --      Pain Edu? --      Excl. in GC? --    No data found.  Updated Vital Signs BP (!) 89/57 (BP Location: Left Arm)   Pulse (!) 103   Resp 17   SpO2 100%   Physical Exam Vitals and nursing note reviewed.  Constitutional:      General: He is not in acute distress.    Appearance: He is not ill-appearing, toxic-appearing or diaphoretic.     Comments: Slightly hard of hearing  HENT:     Head: Normocephalic and atraumatic.  Eyes:     Extraocular Movements: Extraocular movements intact.     Pupils: Pupils are equal, round, and reactive to light.  Neck:     Vascular: No carotid bruit.  Cardiovascular:     Rate and Rhythm: Tachycardia present. Rhythm irregular.     Heart sounds: Normal heart sounds.  Pulmonary:     Effort: Pulmonary effort is normal.     Breath sounds: Normal breath sounds.  Musculoskeletal:        General: No tenderness.     Cervical back: Normal range of motion.  Skin:    Coloration: Skin is jaundiced (scant) and pale.  Neurological:     Mental Status: He is alert and oriented to person, place, and time.      UC Treatments / Results  Labs (all labs ordered are listed, but only abnormal results are displayed) Labs Reviewed - No data to display  EKG   Radiology No results found.  Procedures Procedures (including critical care time)  Medications Ordered in UC Medications  sodium chloride 0.9 % bolus 1,000 mL (has no administration in time range)    Initial Impression / Assessment and Plan / UC Course  I have reviewed  the triage vital signs and the nursing notes.  Pertinent labs & imaging results that were available during my care of the patient were reviewed by me and considered in my medical decision making (see chart for details).     New. Hypotensive. Concerning for recent stroke VS acute heart failure vs other. EKKG  shows new abnormalities. Discussed with patient. Sending via EMS to ER for further work up.   Final Clinical Impressions(s) / UC Diagnoses   Final diagnoses:  None   Discharge Instructions   None    ED Prescriptions    None     PDMP not reviewed this encounter.   Rushie Chestnut, [place] 01/24/21 1225

## 2021-01-24 NOTE — ED Provider Notes (Signed)
  Face-to-face evaluation   History: He complains of dizziness, nausea, abdominal swelling, and frequent falling.  No specific injuries.  He sometimes gets short of breath.  States he used to drink but stopped 8 years ago.  He states sometimes his temper gets out of control.  Seen earlier today at a urgent care, who decided to send him here for further evaluation.  Patient feels like something is wrong but is not sure what it is.  There are no other known active modifying factors.  Physical exam: Elderly alert, somewhat hard of hearing.  No dysarthria or aphasia.  No respiratory distress.  Abdomen is minimally distended.  No lower extremity edema  Medical screening examination/treatment/procedure(s) were conducted as a shared visit with non-physician practitioner(s) and myself.  I personally evaluated the patient during the encounter   7:45 PM-Case discussed with internal medicine resident, on-call for unassigned.  She will admit the patient for management    Mancel Bale, MD 01/24/21 1945

## 2021-01-24 NOTE — H&P (Signed)
Date: 01/24/2021               Patient Name:  Alfred Mercado MRN: [id number]  DOB: [birth year] Age / Sex: 85 y.o., male   PCP: Charlott Rakes, MD         Medical Service: Internal Medicine Teaching Service         Attending Physician: Dr. Gilles Chiquito    First Contact: Dr. Linwood Dibbles Pager: [phone]  Second Contact: Dr. Tamsen Snider Pager: [phone]       After Hours (After 5p/  First Contact Pager: [phone]  weekends / holidays): Second Contact Pager: [phone]   Chief Complaint: generalized weakness, falls  History of Present Illness:   Alfred Mercado is an 85 year old man with history of HFpEF (06/2019 EF 50-55%), atrial fibrillation on Eliquis, right pleural effusion s/p thoracentesis, heavy tobacco use, suspected COPD, vitamin D deficiency presenting with generalized weakness and recent falls.  He reports he has been feeling increasingly weak and has been experiencing dizziness and lightheadedness with ambulation over the last few weeks. Reports 3 days ago (Sat 5/28) he had a fall in the morning after getting up to go to the bathroom where he fell backwards and hit his low back on the vanity and slid down onto his bottom. He denies prodromal symptoms such as dizziness, lightheadedness, or vision changes, but cannot recall the exact mechanism of his fall. States after falling he had significant low back pain as well as acute onset of cramp-like lower abdominal pain which wrapped around to his back. The abdominal pain improved the next day, however the low back pain has persisted and is episodic, occurring with only certain movements. Then yesterday (5/30), he had another fall, this time preceded by dizziness, lightheadedness, vision going black, and "pins and needles sensation" across his chest, and the next thing he remembers is being on his R side and struggling to get up. For both falls he denies hitting his head or losing consciousness. Reports he asked a friend to take him  to urgent care this morning after he realized he was having a much harder time getting out of bed, and he was transported via EMS to the [facility] ED.  He voices concern that for the last few months he has only been able to ambulate very short distances without getting dizzy and lightheaded and needing to sit down. States as long as he can sit down, he is fine. Uses a cane. On chart review, he requested a power wheelchair during his most recent PCP appointment in 11/2020. He was also advised to decrease his cardizem dose which he reports helped somewhat. He also recently requested PCS from his PCP. He denies fevers, chills, night sweats, headaches, palpitations, shortness of breath, dysuria, hematuria, diarrhea, focal weakness. Reports he has been urinating more frequently since taking Lasix and sometimes will "dribble for a while" after urinating. States his appetite has been poor for at least 6 months and he has had unintentional weight loss, drinks ~3 bottles of water a day. . States he takes dulcolax every few days for constipation. Last BM 3 days ago.   ED Course: Afebrile, RR 17-27, P 80s-90s, BP initially 69/46, improved to 80s-90s/50s-60s then 110s-120s/60s-80s after 500 cc bolus NS. CBC notable for WBC 9.7, microcytic anemia (Hgb 9.8 and MCV 79.6; baseline 10-11). BMP significant for hypokalemia (3.2), BUN/Cr 24/2.11 (baseline Cr 1.2-1.4). LFTs with Tprot 6.3, albumin 2.8, AST/ALT/ALP unremarkable. Troponin 13>12. Lipase 20. Urinalysis with large hgb (>50),  large leuks (>50), protein 30, many bacteria. Imaging results below. Patient given 1 g ceftriaxone and 500 cc bolus NS. IMTS consulted for admission.  Meds:  Current Meds  Medication Sig  . acetaminophen (TYLENOL) 325 MG tablet Take 650 mg by mouth every 6 (six) hours as needed for mild pain or headache.  . albuterol (VENTOLIN HFA) 108 (90 Base) MCG/ACT inhaler Inhale 2 puffs into the lungs every 6 (six) hours as needed for wheezing or shortness of  breath.  Marland Kitchen apixaban (ELIQUIS) 5 MG TABS tablet Take 1 tablet (5 mg total) by mouth 2 (two) times daily.  . bisacodyl (DULCOLAX) 5 MG EC tablet Take 20 mg by mouth daily as needed for moderate constipation.  Marland Kitchen diltiazem (CARDIZEM SR) 90 MG 12 hr capsule Take 1 capsule (90 mg total) by mouth 2 (two) times daily.  . furosemide (LASIX) 40 MG tablet Take 1 tablet (40 mg total) by mouth See admin instructions. (Patient taking differently: Take 40 mg by mouth daily.)  . metoprolol tartrate (LOPRESSOR) 25 MG tablet TAKE 1 TABLET(25 MG) BY MOUTH TWICE DAILY (Patient taking differently: Take 25 mg by mouth 2 (two) times daily.)  . Misc. Devices [employer] wheelchair.  Diagnosis atrial fibrillation, gait abnormality  . Tiotropium Bromide Monohydrate (SPIRIVA RESPIMAT) 2.5 MCG/ACT AERS INHALE 2 PUFFS BY MOUTH DAILY (Patient taking differently: Inhale 2 puffs into the lungs daily. INHALE 2 PUFFS BY MOUTH DAILY)  . Vitamin D, Ergocalciferol, (DRISDOL) 1.25 MG (50000 UNIT) CAPS capsule TAKE 1 CAPSULE BY MOUTH 1 TIME A WEEK (Patient taking differently: Take 50,000 Units by mouth every 7 (seven) days.)    Allergies: Allergies as of 01/24/2021  . (No Known Allergies)   Past Medical History:  Diagnosis Date  . Acute heart failure with preserved ejection fraction ([place])   . Atrial fibrillation with rapid ventricular response ([place]) 07/11/2019    Family History  Problem Relation Age of Onset  . Aneurysm Father    Social History: From [place], [place], originally. Moved to [place] 15 years ago and has been living alone in a motel since then. Has 9 children living across the country. Retired from working as a Veterinary surgeon for [employer]. Reports he quit drinking alcohol "cold [place]" 8 years ago but endorses previous heavy use (per chart review, used to drink a fifth a day). Per chart review, has a 50-pack-year smoking history however reports he has cut down significantly recently, smokes 3-4  cigarettes a day. Reports trying marijuana once but denies other illicit drug use.   Review of Systems: A complete ROS was negative except as per HPI.   Physical Exam: Blood pressure 116/63, pulse 90, temperature 97.6 F (36.4 C), temperature source Oral, resp. rate 19, height _0  (1.854 m), weight 79.4 kg, SpO2 99 %. Constitutional: thin elderly man lying in ED stretcher, in no acute distress, very hard of hearing (hears better on the L than the R) HEENT: normocephalic atraumatic, mucous membranes dry, edentulous; copious cerumen in the R ear canal however visible portion of R tympanic membrane appears normal; L tympanic membrane normal Eyes: conjunctiva non-erythematous Neck: supple, no lymphadenopathy Cardiovascular: regular rate, irregularly irregular rhythm, no m/r/g, no lower extremity edema Pulmonary/Chest: normal work of breathing on room air, lungs clear to auscultation bilaterally Abdominal: soft, slightly protuberant (per patient, his baseline), non-tender, no CVA tenderness, normoactive bowel sounds MSK: sarcopenic; tenderness with palpation of lumbar spine, minimal paraspinal tenderness Neurological: alert & oriented to person, place, time, and situation; PERRL; No  facial droop; sensation grossly intact throughout;  5/5 strength in bilateral upper extremities and lower extremities except for 4/5 strength with L hip flexion; patient able to transition from lying to sitting on edge of bed using his arms and by rocking his body forward; no dysmetria with finger-to-nose Skin: posterior of R hand with ecchymosis and abrasion with overlying bandaid, L toenail long, thick, and curves to the L nearly touching L 2nd toe; skin is warm and dry except bilateral lower legs and feet are cooler to touch but DP pulses 2+ bilaterally Psych: normal mood and affect  EKG: personally reviewed my interpretation is atrial fibrillation with L-axis deviation, unchanged from prior  CT CHEST ABDOMEN PELVIS  WO CONTRAST  Addendum Date: 01/24/2021   ADDENDUM REPORT: 01/24/2021 18:56 ADDENDUM: 9. Infrarenal 3.3 cm abdominal aortic aneurysm. Recommend follow-up ultrasound every 3 years. This recommendation follows ACR consensus guidelines: White Paper of the ACR Incidental Findings Committee II on Vascular Findings. J Am Coll Radiol 2013; 10:789-794. Electronically Signed   By: Ilona Sorrel M.D.   On: 01/24/2021 18:56   Result Date: 01/24/2021 CLINICAL DATA:  Abnormal chest radiograph. Recent fall with back pain. EXAM: CT CHEST, ABDOMEN AND PELVIS WITHOUT CONTRAST TECHNIQUE: Multidetector CT imaging of the chest, abdomen and pelvis was performed following the standard protocol without IV contrast. COMPARISON:  Chest radiograph from earlier today. 10/14/2019 chest CT. FINDINGS: CT CHEST FINDINGS Cardiovascular: Normal heart size. No significant pericardial effusion/thickening. Left main and 3 vessel coronary atherosclerosis. Atherosclerotic nonaneurysmal thoracic aorta. Stable top-normal caliber main pulmonary artery (3.0 cm diameter). Mediastinum/Nodes: No discrete thyroid nodules. Unremarkable esophagus. No pathologically enlarged axillary, mediastinal or hilar lymph nodes, noting limited sensitivity for the detection of hilar adenopathy on this noncontrast study. Lungs/Pleura: No pneumothorax. Small loculated right pleural effusion with smooth mild right pleural thickening, unchanged since 10/14/2019 chest CT. No left pleural effusion. Moderate centrilobular emphysema with diffuse bronchial wall thickening. Thick irregular bandlike consolidation with associated volume loss and mild surrounding distortion and thin parenchymal banding throughout the periphery of the mid to lower right lung, abutting the loculated right pleural effusion, similar, compatible with pleural-parenchymal scarring. Patchy debris narrowing the lobar bronchus in the right lower lobe, similar. No acute consolidative airspace disease, lung masses  or significant pulmonary nodules. Musculoskeletal: No aggressive appearing focal osseous lesions. Moderate to marked thoracic spondylosis. No fracture in chest. CT ABDOMEN PELVIS FINDINGS Hepatobiliary: Normal liver size. Simple 2.5 cm left liver dome cyst. No additional liver lesions. Normal gallbladder with no radiopaque cholelithiasis. No biliary ductal dilatation. Pancreas: Normal, with no mass or duct dilation. Spleen: Normal size. No mass. Adrenals/Urinary Tract: Normal adrenals. Staghorn calculus replacing the right renal pelvis and much of mid to lower right renal collecting system, measuring up to 40 x 23 mm in the right renal pelvis. Scattered gas throughout right renal collecting system. Mild right pelvicaliectasis. Nonobstructing 17 mm upper left renal stone. Punctate nonobstructing lower left renal stone. No left hydronephrosis. Complex 5.7 x 4.4 cm exophytic cystic mass in the posterior interpolar left kidney (series 3/image 80) with circumferential wall thickening and surrounding fat stranding. Simple exophytic 3.1 cm lower left renal cyst. Normal caliber ureters. No ureteral stones. Gas in the nondependent bladder. Otherwise normal bladder with no bladder stones. Stomach/Bowel: Small hiatal hernia. Otherwise normal nondistended stomach. Small left inguinal hernia contains a portion of a small bowel loop. No small bowel dilatation, wall thickening or pneumatosis. Appendix not discretely visualized. Normal large bowel with no diverticulosis, large bowel wall  thickening or pericolonic fat stranding. Vascular/Lymphatic: Atherosclerotic abdominal aorta with 3.3 cm infrarenal abdominal aortic aneurysm. No pathologically enlarged lymph nodes in the abdomen or pelvis. Reproductive: Top normal size prostate with nonspecific coarse internal prostatic calcifications. Other: No pneumoperitoneum, ascites or focal fluid collection. Musculoskeletal: No aggressive appearing focal osseous lesions. Comminuted moderate  burst fracture of L1 vertebral body with approximately 50% loss of vertebral body height. Bony retropulsion up to 5 mm. No additional fractures. Marked lumbar spondylosis, most prominent at L3-4. IMPRESSION: 1. Moderate comminuted burst fracture of L1 vertebral body with approximately 50% loss of vertebral body height, probably acute. Bony retropulsion up to 5 mm. No additional fractures. 2. Indeterminate complex 5.7 cm thick-walled exophytic cystic mass in the posterior interpolar left kidney surrounding fat stranding, cannot exclude renal cell carcinoma, with the differential including traumatic hemorrhage within a benign renal cyst. Recommend further characterization with renal mass protocol MRI (preferred) or CT abdomen without and with IV contrast, which may be performed at this time or on a short term outpatient basis as clinically warranted. 3. Large right staghorn calculus with mild right pelvicaliectasis. Scattered gas throughout the right renal collecting system and nondependent bladder, presumably due to recent instrumentation. Nonobstructing left nephrolithiasis. 4. Small loculated right pleural effusion with smooth mild right pleural thickening, unchanged since 10/14/2019 chest CT. 5. Left main and 3 vessel coronary atherosclerosis. 6. Small hiatal hernia. 7. Small left inguinal hernia contains a portion of a small bowel loop. No evidence of acute bowel complication. 8. Aortic Atherosclerosis (ICD10-I70.0) and Emphysema (ICD10-J43.9). Electronically Signed: By: Ilona Sorrel M.D. On: 01/24/2021 18:23   DG Chest 2 View  Result Date: 01/24/2021 CLINICAL DATA:  Weakness with several falls EXAM: CHEST - 2 VIEW COMPARISON:  August 29, 2020 FINDINGS: There is partially loculated pleural effusion on the right with airspace opacity in this region consistent with atelectasis and potential superimposed pneumonia. There is a nodular appearing opacity in the posteromedial aspect of the right lower lobe measuring  3.1 x 1.8 cm. Left lung is clear except for mild left base atelectasis. Heart is upper normal in size with pulmonary vascularity normal. No adenopathy. Probable bone island in the medial right humeral head. IMPRESSION: Partially loculated pleural effusion on the right with atelectasis and potential superimposed pneumonia right base. In the posteromedial right lower lobe region, there is a nodular appearing opacity measuring 3.1 x 1.8 cm. While this area could represent loculated fluid, a pulmonary mass in this area is of concern. Noncontrast chest CT to further evaluate this area is warranted. Left lung is clear except for mild left base atelectasis. Heart upper normal in size. Electronically Signed   By: Lowella Grip III M.D.   On: 01/24/2021 14:33   CT HEAD WO CONTRAST  Result Date: 01/24/2021 CLINICAL DATA:  Minor head trauma, multiple falls EXAM: CT HEAD WITHOUT CONTRAST TECHNIQUE: Contiguous axial images were obtained from the base of the skull through the vertex without intravenous contrast. Sagittal and coronal MPR images reconstructed from axial data set. COMPARISON:  None FINDINGS: Brain: Generalized atrophy. Normal ventricular morphology. No midline shift or mass effect. Small vessel chronic ischemic changes of deep cerebral white matter. Low-attenuation LEFT frontotemporal extra-axial collection question sequela of old subdural hematoma versus subdural hygroma. No acute intracranial hemorrhage, mass lesion, or evidence of acute infarction. Vascular: Atherosclerotic calcification of internal carotid and vertebral arteries at skull base. No hyperdense vessels. Skull: Intact Sinuses/Orbits: Clear Other: N/A IMPRESSION: Atrophy with small vessel chronic ischemic changes of deep  cerebral white matter. Low-attenuation LEFT frontotemporal extra-axial collection question sequela of old subdural hematoma versus subdural hygroma. No acute intracranial abnormalities. Electronically Signed   By: Lavonia Dana  M.D.   On: 01/24/2021 15:24   Assessment & Plan by Problem: Principal Problem:   Complicated urinary tract infection Active Problems:   Hypokalemia   Acute-on-chronic kidney injury ([place])   Left renal mass   Staghorn renal calculus   Microscopic hematuria   Aortic aneurysm (HCC)   Closed burst fracture of lumbar vertebra [facility])  Mr. Brenen Beigel is an 85 year old man with history of HFpEF (06/2019 EF 50-55%), atrial fibrillation on Eliquis, right pleural effusion s/p thoracentesis, heavy tobacco use, suspected COPD, vitamin D deficiency presenting with generalized weakness and recent falls and admitted for complicated UTI with possible pyelonephritic abscess and AKI.  Complicated urinary tract infection with possible pyelonephritic abscess UA significant for many bacteria, WBC, negative nitrites. History of MDROs. Last UA from 2021 grew enterobacter cloacae complex that was resistant to augmentin, cefazolin, and cefuroxime.  Sepsis criteria met with hypotension, tachycardia on admission. Initial hypotension on arrival to the ED had seemed to improve but blood pressure is starting to trend down again. He is afebrile, no leukocytosis. Mentating at baseline. No suprapubic or CVA tenderness. LUTS as below.  Plan - follow urine and blood cultures - 500cc fluid bolus. Will tread lightly with HFpEF - will switch to cefepime given his MDRO history - page MD for MAP <60 - delirium precautions  Left Complex cystic renal mass Differential diagnosis of this renal mass is broad and includes hematoma vs abscess vs neoplasm. Hematoma is possible in the setting of his recent fall on Friday. Hemoglobin is stable and at baseline on admission which is reassuring. Renal abscess would remain high on the differential. Discussed with urology who will see in the morning but recommended foley placement to decompress bladder and an IR percutaneous neph tube. Plan - Urology consulted, appreciate their  expertise - NPO at midnight - IR order for perc nephrostomy tube placed - will need dedicated MRI with renal mass protocol for further evaluation at some point in the future  Right Staghorn renal calculus with emphysematous features in the right renal collecting system. No surrounding inflammatory changes to suggest emphysematous pyelitis or pyelonephritis although a gas forming infection remains high on the differential.  No fistula between the GI tract and the urinary system is noted on CT. Pt did not undergo a straight cath or foley placement prior to the CT.  - Plan as above  AKI on CKD stage 3a Hypokalemia BUN/Cr 24/2.11 (baseline Cr 1.2-1.4). K 3.2. Appears dry on exam. Suspect pre-renal in the setting of decreased appetite and fluid intake but there may also be an obstructive component in the setting of his other urologic findings.  - 500 cc LR as above - KlorCon 40 mEq x 2 - AM BMP    Microscopic hematuria UA is significant for >50 RBC/hpf which have been present on his prior UA's over the past 83mo He has multiple sources for the hematuria.  - appreciate urology consult  LUTS. Symptoms of bladder outlet obstruction have been going on for >1 yr. Imaging significant for prostatic calcifications. Pt previously referred to urology by his PCP however those records are not in Epic for review and it's unclear if pt ever followed up with them. Bladder distended on CT. - place foley catheter per urology - appreciate urology consult  Chronic anemia. Stable and at baseline. Hgb  9.8 and MCV 79.6; baseline 10-11. - check iron studies  Infrarenal aortic aneurysm (3.3 cm).  - Recommend ongoing surveillence with ultrasound every 3 years. - control blood pressure  Multivessel CAD, aortic atherosclerosis Last lipid panel from 2020 looked suprisingly well given the extent of his atherosclerotic disease. No chest pain on admission. - recommend discussion of starting statin therapy in the  future  Small loculated right pleural effusion. Decreased size compared prior admissions at which time fluid studies were transudative and thought to be in the setting of heart failure.   HFpEF. Follows with Dr. Acie Fredrickson in cardiology. Chronic and stable.  Persistent atrial fibrillation. CHA2DS2-VASc score 7.  Anticoagulated on eliquis Rate controlled on diltiazem 90 mg twice daily (decreased 1.5 months ago from 120 mg twice daily) and metoprolol (25 mg twice daily).  - Holding eliquis ahead of urologic procedure - Holding diltiazem and metoprolol in setting of relative hypotension - Holding home Lasix 40 mg daily in setting of relative hypotension.   COPD. Chronic and stable. Continue spiriva and albuterol  Comminuted L1 burst fracture with retropulsion secondary to mechanical fall  - f/u L-spine MRI - will need referral to neurosurgery after discharge.  Chronic subdural hematoma vs hygroma. No midline shift. Likely in the setting of recurrent falls. No further workup at this time.  Recurrent falls. Multiple risk factors for falls including antihypertensives, age, and acute infection. Neurologic exam non-focal. - Fall precautions - PT/OT eval and treat  Impacted cerumen of R ear Patient with significant cerumen burden in R ear. Was irrigated at last PCP appointment but still endorsing R-sided hearing impairment. - consider inpatient Debrox treatment depending on duration of admission  Tobacco use Patient proud to report he has cut back significantly on his smoking, now smoking 3-4 cigarettes daily. - nicotine patch  Diet: Normal VTE: SCDs IVF: 500 cc LR Code: DNR  Prior to Admission Living Arrangement: Living alone in a motel Anticipated Discharge Location: TBD  Dispo: Admit patient to Inpatient with expected length of stay greater than 2 midnights.  Signed: Alexandria Lodge, MD Internal Medicine Resident, PGY-1 [facility] Pager:  [phone] 11:20 PM, 01/24/2021

## 2021-01-24 NOTE — Progress Notes (Signed)
Pharmacy Antibiotic Note  Alfred Mercado is a 85 y.o. male admitted on 01/24/2021 presenting with dizziness, abdominal swelling, concern for UTI with hx of resistant organisms to 3rd gen cephalosporins.  Pharmacy has been consulted for Cefepime dosing.  Plan: Cefepime 2g IV q 24 hours Monitor renal function, Cx and clinical progression to narrow  Height: 6\' 1"  (185.4 cm) Weight: 79.4 kg (175 lb) IBW/kg (Calculated) : 79.9  Temp (24hrs), Avg:97.7 F (36.5 C), Min:97.5 F (36.4 C), Max:97.9 F (36.6 C)  Recent Labs  Lab 01/24/21 1323  WBC 9.7  CREATININE 2.11*    Estimated Creatinine Clearance: 29.8 mL/min (A) (by C-G formula based on SCr of 2.11 mg/dL (H)).    No Known Allergies  01/26/21, PharmD Clinical Pharmacist ED Pharmacist Phone # [phone] 01/24/2021 9:19 PM

## 2021-01-24 NOTE — ED Triage Notes (Signed)
Pt BP 69/46. Pt states he thinks he took his BP medication this morning.

## 2021-01-24 NOTE — ED Notes (Signed)
Called 911 to call in EMERGENCY transport to ER.

## 2021-01-24 NOTE — ED Notes (Signed)
Patient is being discharged from the Urgent Care and sent to the Emergency Department via POV. Per Clent Jacks PA, patient is in need of higher level of care due to hypotension and confusion. Patient is aware and verbalizes understanding of plan of care. Vitals:   01/24/21 1204 01/24/21 1211  BP: (!) 89/57 90/60  Pulse:    Resp:    SpO2:

## 2021-01-24 NOTE — ED Notes (Signed)
Pt states he is on blood thinners.

## 2021-01-24 NOTE — ED Triage Notes (Signed)
Pt reports falling Saturday morning and hurting his back. He states he fell this morning and cut his hand.

## 2021-01-24 NOTE — ED Provider Notes (Signed)
[facility] EMERGENCY DEPARTMENT Provider Note   CSN: [id number] Arrival date & time: 01/24/21  1257     History Chief Complaint  Patient presents with  . Weakness    Alfred Mercado is a 85 y.o. male.  HPI      Alfred Mercado is a 85 y.o. male, with a history of tobacco use, A. fib, presenting to the ED with generalized weakness and increased falls over the past few days.  He reports episodic dizziness causing his falls. He states he thinks he may be coughing more, but is not sure.  Accompanied by some shortness of breath.  He is an active tobacco user. He also complains of decreased flatulence over the past few days. Denies fever/chills, chest pain, lower extremity edema/pain, N/V/D, abdominal pain, lower extremity edema/pain, orthopnea, or any other complaints.    Past Medical History:  Diagnosis Date  . Acute heart failure with preserved ejection fraction (HCC)   . Atrial fibrillation with rapid ventricular response (HCC) 07/11/2019    Patient Active Problem List   Diagnosis Date Noted  . Acute respiratory failure with hypoxia (HCC) 08/13/2020  . UTI (urinary tract infection) 06/02/2020  . Community acquired pneumonia of right lower lobe of lung 06/02/2020  . Acute hypoxemic respiratory failure (HCC) 06/02/2020  . Hypokalemia 06/02/2020  . Acute diastolic CHF (congestive heart failure) (HCC) 02/06/2020  . Exertional shortness of breath 10/14/2019  . Atrial fibrillation with RVR (HCC) 10/14/2019  . Pleural effusion   . Chronic heart failure with preserved ejection fraction [facility])     Past Surgical History:  Procedure Laterality Date  . APPENDECTOMY         Family History  Problem Relation Age of Onset  . Aneurysm Father     Social History   Tobacco Use  . Smoking status: Light Tobacco Smoker  . Smokeless tobacco: Never Used  Substance Use Topics  . Alcohol use: Not Currently  . Drug use: Never    Home Medications Prior to  Admission medications   Medication Sig Start Date End Date Taking? Authorizing Provider  acetaminophen (TYLENOL) 325 MG tablet Take 650 mg by mouth every 6 (six) hours as needed for mild pain or headache.    [provider]  albuterol (VENTOLIN HFA) 108 (90 Base) MCG/ACT inhaler Inhale 2 puffs into the lungs every 6 (six) hours as needed for wheezing or shortness of breath. 11/30/20   Hoy Register, MD  apixaban (ELIQUIS) 5 MG TABS tablet Take 1 tablet (5 mg total) by mouth 2 (two) times daily. 11/30/20   Hoy Register, MD  carbamide peroxide (DEBROX) 6.5 % OTIC solution Place 5 drops into the right ear 2 (two) times daily. 11/30/20   Hoy Register, MD  diltiazem (CARDIZEM SR) 90 MG 12 hr capsule Take 1 capsule (90 mg total) by mouth 2 (two) times daily. 11/30/20   Hoy Register, MD  furosemide (LASIX) 40 MG tablet Take 1 tablet (40 mg total) by mouth See admin instructions. 11/30/20   Hoy Register, MD  metoprolol tartrate (LOPRESSOR) 25 MG tablet TAKE 1 TABLET(25 MG) BY MOUTH TWICE DAILY 11/30/20   Hoy Register, MD  Misc. Devices MISC Power wheelchair.  Diagnosis atrial fibrillation, gait abnormality 12/01/20   Hoy Register, MD  nicotine (NICODERM CQ - DOSED IN MG/24 HOURS) 21 mg/24hr patch Place 1 patch (21 mg total) onto the skin daily. 06/07/20   Hoy Register, MD  nicotine (NICODERM CQ - DOSED IN MG/24 HOURS) 21 mg/24hr patch PLACE  1 PATCH (21 MG TOTAL) ONTO THE SKIN DAILY. 06/06/20 06/06/21  Dolan Amen, MD  nitrofurantoin, macrocrystal-monohydrate, (MACROBID) 100 MG capsule TAKE 1 CAPSULE (100 MG TOTAL) BY MOUTH EVERY TWELVE HOURS. 06/06/20 06/06/21  Dolan Amen, MD  polyethylene glycol powder (GLYCOLAX/MIRALAX) 17 GM/SCOOP powder Take 17 g by mouth daily. 03/02/20   Hoy Register, MD  Tiotropium Bromide Monohydrate (SPIRIVA RESPIMAT) 2.5 MCG/ACT AERS INHALE 2 PUFFS BY MOUTH DAILY 11/30/20   Hoy Register, MD  Vitamin D, Ergocalciferol, (DRISDOL) 1.25 MG (50000 UNIT) CAPS  capsule TAKE 1 CAPSULE BY MOUTH 1 TIME A WEEK Patient taking differently: Take 50,000 Units by mouth every 7 (seven) days. 12/01/20   Hoy Register, MD    Allergies    Patient has no known allergies.  Review of Systems   Review of Systems  Constitutional: Negative for chills, diaphoresis and fever.  Respiratory: Positive for cough and shortness of breath.   Cardiovascular: Negative for chest pain and leg swelling.  Gastrointestinal: Negative for abdominal pain, diarrhea, nausea and vomiting.  Musculoskeletal: Positive for back pain. Negative for neck pain.  All other systems reviewed and are negative.   Physical Exam Updated Vital Signs BP 96/66 (BP Location: Left Arm)   Pulse 79   Temp 97.6 F (36.4 C) (Oral)   Resp 17   Ht 6\' 1"  (1.854 m)   Wt 79.4 kg   SpO2 100%   BMI 23.09 kg/m   Physical Exam Vitals and nursing note reviewed.  Constitutional:      General: He is not in acute distress.    Appearance: He is well-developed. He is not diaphoretic.  HENT:     Head: Normocephalic and atraumatic.     Mouth/Throat:     Mouth: Mucous membranes are moist.     Pharynx: Oropharynx is clear.  Eyes:     Conjunctiva/sclera: Conjunctivae normal.  Cardiovascular:     Rate and Rhythm: Normal rate and regular rhythm.     Pulses: Normal pulses.          Radial pulses are 2+ on the right side and 2+ on the left side.       Posterior tibial pulses are 2+ on the right side and 2+ on the left side.     Heart sounds: Normal heart sounds.     Comments: Tactile temperature in the extremities appropriate and equal bilaterally. Pulmonary:     Effort: Pulmonary effort is normal. No respiratory distress.     Breath sounds: Normal breath sounds.     Comments: Patient is able to lie supine without noted symptoms of orthopnea. Abdominal:     Palpations: Abdomen is soft.     Tenderness: There is no abdominal tenderness. There is no guarding.  Musculoskeletal:     Cervical back: Normal  range of motion and neck supple.     Right lower leg: No edema.     Left lower leg: No edema.     Comments: Patient's extremities were inspected and ranged in each of the joints without noted pain or difficulty.  No evidence of chest trauma.  Skin:    General: Skin is warm and dry.  Neurological:     Mental Status: He is alert.     Comments: Sensation to light touch grossly intact in all extremities. No facial droop. Grip strength equal bilaterally. Upper extremity strength 5/5 bilaterally. Patient can lift his lower extremities off the bed without noted difficulty.  Psychiatric:        Mood  and Affect: Mood and affect normal.        Speech: Speech normal.        Behavior: Behavior normal.     ED Results / Procedures / Treatments   Labs (all labs ordered are listed, but only abnormal results are displayed) Labs Reviewed  BASIC METABOLIC PANEL - Abnormal; Notable for the following components:      Result Value   Potassium 3.2 (*)    Glucose, Bld 120 (*)    BUN 24 (*)    Creatinine, Ser 2.11 (*)    GFR, Estimated 30 (*)    All other components within normal limits  CBC - Abnormal; Notable for the following components:   RBC 3.93 (*)    Hemoglobin 9.8 (*)    HCT 31.3 (*)    MCV 79.6 (*)    MCH 24.9 (*)    All other components within normal limits  URINALYSIS, ROUTINE W REFLEX MICROSCOPIC - Abnormal; Notable for the following components:   APPearance TURBID (*)    Hgb urine dipstick LARGE (*)    Protein, ur 30 (*)    Leukocytes,Ua LARGE (*)    RBC / HPF >50 (*)    WBC, UA >50 (*)    Bacteria, UA MANY (*)    All other components within normal limits  CBG MONITORING, ED - Abnormal; Notable for the following components:   Glucose-Capillary 106 (*)    All other components within normal limits  LIPASE, BLOOD  HEPATIC FUNCTION PANEL  TROPONIN I (HIGH SENSITIVITY)  TROPONIN I (HIGH SENSITIVITY)    EKG EKG Interpretation  Date/Time:  Tuesday Jan 24 2021 13:28:39  EDT Ventricular Rate:  94 PR Interval:    QRS Duration: 94 QT Interval:  386 QTC Calculation: 482 R Axis:   -34 Text Interpretation: Atrial fibrillation Left axis deviation Nonspecific ST abnormality Abnormal ECG since last tracing no significant change Confirmed by Mancel Bale [phone]) on 01/24/2021 6:59:19 PM   Radiology CT ABDOMEN PELVIS WO CONTRAST  Addendum Date: 01/24/2021   ADDENDUM REPORT: 01/24/2021 18:56 ADDENDUM: 9. Infrarenal 3.3 cm abdominal aortic aneurysm. Recommend follow-up ultrasound every 3 years. This recommendation follows ACR consensus guidelines: White Paper of the ACR Incidental Findings Committee II on Vascular Findings. J Am Coll Radiol 2013; 10:789-794. Electronically Signed   By: Delbert Phenix M.D.   On: 01/24/2021 18:56   Result Date: 01/24/2021 CLINICAL DATA:  Abnormal chest radiograph. Recent fall with back pain. EXAM: CT CHEST, ABDOMEN AND PELVIS WITHOUT CONTRAST TECHNIQUE: Multidetector CT imaging of the chest, abdomen and pelvis was performed following the standard protocol without IV contrast. COMPARISON:  Chest radiograph from earlier today. 10/14/2019 chest CT. FINDINGS: CT CHEST FINDINGS Cardiovascular: Normal heart size. No significant pericardial effusion/thickening. Left main and 3 vessel coronary atherosclerosis. Atherosclerotic nonaneurysmal thoracic aorta. Stable top-normal caliber main pulmonary artery (3.0 cm diameter). Mediastinum/Nodes: No discrete thyroid nodules. Unremarkable esophagus. No pathologically enlarged axillary, mediastinal or hilar lymph nodes, noting limited sensitivity for the detection of hilar adenopathy on this noncontrast study. Lungs/Pleura: No pneumothorax. Small loculated right pleural effusion with smooth mild right pleural thickening, unchanged since 10/14/2019 chest CT. No left pleural effusion. Moderate centrilobular emphysema with diffuse bronchial wall thickening. Thick irregular bandlike consolidation with associated volume  loss and mild surrounding distortion and thin parenchymal banding throughout the periphery of the mid to lower right lung, abutting the loculated right pleural effusion, similar, compatible with pleural-parenchymal scarring. Patchy debris narrowing the lobar bronchus in the right lower  lobe, similar. No acute consolidative airspace disease, lung masses or significant pulmonary nodules. Musculoskeletal: No aggressive appearing focal osseous lesions. Moderate to marked thoracic spondylosis. No fracture in chest. CT ABDOMEN PELVIS FINDINGS Hepatobiliary: Normal liver size. Simple 2.5 cm left liver dome cyst. No additional liver lesions. Normal gallbladder with no radiopaque cholelithiasis. No biliary ductal dilatation. Pancreas: Normal, with no mass or duct dilation. Spleen: Normal size. No mass. Adrenals/Urinary Tract: Normal adrenals. Staghorn calculus replacing the right renal pelvis and much of mid to lower right renal collecting system, measuring up to 40 x 23 mm in the right renal pelvis. Scattered gas throughout right renal collecting system. Mild right pelvicaliectasis. Nonobstructing 17 mm upper left renal stone. Punctate nonobstructing lower left renal stone. No left hydronephrosis. Complex 5.7 x 4.4 cm exophytic cystic mass in the posterior interpolar left kidney (series 3/image 80) with circumferential wall thickening and surrounding fat stranding. Simple exophytic 3.1 cm lower left renal cyst. Normal caliber ureters. No ureteral stones. Gas in the nondependent bladder. Otherwise normal bladder with no bladder stones. Stomach/Bowel: Small hiatal hernia. Otherwise normal nondistended stomach. Small left inguinal hernia contains a portion of a small bowel loop. No small bowel dilatation, wall thickening or pneumatosis. Appendix not discretely visualized. Normal large bowel with no diverticulosis, large bowel wall thickening or pericolonic fat stranding. Vascular/Lymphatic: Atherosclerotic abdominal aorta with  3.3 cm infrarenal abdominal aortic aneurysm. No pathologically enlarged lymph nodes in the abdomen or pelvis. Reproductive: Top normal size prostate with nonspecific coarse internal prostatic calcifications. Other: No pneumoperitoneum, ascites or focal fluid collection. Musculoskeletal: No aggressive appearing focal osseous lesions. Comminuted moderate burst fracture of L1 vertebral body with approximately 50% loss of vertebral body height. Bony retropulsion up to 5 mm. No additional fractures. Marked lumbar spondylosis, most prominent at L3-4. IMPRESSION: 1. Moderate comminuted burst fracture of L1 vertebral body with approximately 50% loss of vertebral body height, probably acute. Bony retropulsion up to 5 mm. No additional fractures. 2. Indeterminate complex 5.7 cm thick-walled exophytic cystic mass in the posterior interpolar left kidney surrounding fat stranding, cannot exclude renal cell carcinoma, with the differential including traumatic hemorrhage within a benign renal cyst. Recommend further characterization with renal mass protocol MRI (preferred) or CT abdomen without and with IV contrast, which may be performed at this time or on a short term outpatient basis as clinically warranted. 3. Large right staghorn calculus with mild right pelvicaliectasis. Scattered gas throughout the right renal collecting system and nondependent bladder, presumably due to recent instrumentation. Nonobstructing left nephrolithiasis. 4. Small loculated right pleural effusion with smooth mild right pleural thickening, unchanged since 10/14/2019 chest CT. 5. Left main and 3 vessel coronary atherosclerosis. 6. Small hiatal hernia. 7. Small left inguinal hernia contains a portion of a small bowel loop. No evidence of acute bowel complication. 8. Aortic Atherosclerosis (ICD10-I70.0) and Emphysema (ICD10-J43.9). Electronically Signed: By: Delbert Phenix M.D. On: 01/24/2021 18:23   DG Chest 2 View  Result Date: 01/24/2021 CLINICAL  DATA:  Weakness with several falls EXAM: CHEST - 2 VIEW COMPARISON:  August 29, 2020 FINDINGS: There is partially loculated pleural effusion on the right with airspace opacity in this region consistent with atelectasis and potential superimposed pneumonia. There is a nodular appearing opacity in the posteromedial aspect of the right lower lobe measuring 3.1 x 1.8 cm. Left lung is clear except for mild left base atelectasis. Heart is upper normal in size with pulmonary vascularity normal. No adenopathy. Probable bone island in the medial right humeral head. IMPRESSION:  Partially loculated pleural effusion on the right with atelectasis and potential superimposed pneumonia right base. In the posteromedial right lower lobe region, there is a nodular appearing opacity measuring 3.1 x 1.8 cm. While this area could represent loculated fluid, a pulmonary mass in this area is of concern. Noncontrast chest CT to further evaluate this area is warranted. Left lung is clear except for mild left base atelectasis. Heart upper normal in size. Electronically Signed   By: Bretta Bang III M.D.   On: 01/24/2021 14:33   CT HEAD WO CONTRAST  Result Date: 01/24/2021 CLINICAL DATA:  Minor head trauma, multiple falls EXAM: CT HEAD WITHOUT CONTRAST TECHNIQUE: Contiguous axial images were obtained from the base of the skull through the vertex without intravenous contrast. Sagittal and coronal MPR images reconstructed from axial data set. COMPARISON:  None FINDINGS: Brain: Generalized atrophy. Normal ventricular morphology. No midline shift or mass effect. Small vessel chronic ischemic changes of deep cerebral white matter. Low-attenuation LEFT frontotemporal extra-axial collection question sequela of old subdural hematoma versus subdural hygroma. No acute intracranial hemorrhage, mass lesion, or evidence of acute infarction. Vascular: Atherosclerotic calcification of internal carotid and vertebral arteries at skull base. No  hyperdense vessels. Skull: Intact Sinuses/Orbits: Clear Other: N/A IMPRESSION: Atrophy with small vessel chronic ischemic changes of deep cerebral white matter. Low-attenuation LEFT frontotemporal extra-axial collection question sequela of old subdural hematoma versus subdural hygroma. No acute intracranial abnormalities. Electronically Signed   By: Ulyses Southward M.D.   On: 01/24/2021 15:24   CT Chest Wo Contrast  Addendum Date: 01/24/2021   ADDENDUM REPORT: 01/24/2021 18:56 ADDENDUM: 9. Infrarenal 3.3 cm abdominal aortic aneurysm. Recommend follow-up ultrasound every 3 years. This recommendation follows ACR consensus guidelines: White Paper of the ACR Incidental Findings Committee II on Vascular Findings. J Am Coll Radiol 2013; 10:789-794. Electronically Signed   By: Delbert Phenix M.D.   On: 01/24/2021 18:56   Result Date: 01/24/2021 CLINICAL DATA:  Abnormal chest radiograph. Recent fall with back pain. EXAM: CT CHEST, ABDOMEN AND PELVIS WITHOUT CONTRAST TECHNIQUE: Multidetector CT imaging of the chest, abdomen and pelvis was performed following the standard protocol without IV contrast. COMPARISON:  Chest radiograph from earlier today. 10/14/2019 chest CT. FINDINGS: CT CHEST FINDINGS Cardiovascular: Normal heart size. No significant pericardial effusion/thickening. Left main and 3 vessel coronary atherosclerosis. Atherosclerotic nonaneurysmal thoracic aorta. Stable top-normal caliber main pulmonary artery (3.0 cm diameter). Mediastinum/Nodes: No discrete thyroid nodules. Unremarkable esophagus. No pathologically enlarged axillary, mediastinal or hilar lymph nodes, noting limited sensitivity for the detection of hilar adenopathy on this noncontrast study. Lungs/Pleura: No pneumothorax. Small loculated right pleural effusion with smooth mild right pleural thickening, unchanged since 10/14/2019 chest CT. No left pleural effusion. Moderate centrilobular emphysema with diffuse bronchial wall thickening. Thick  irregular bandlike consolidation with associated volume loss and mild surrounding distortion and thin parenchymal banding throughout the periphery of the mid to lower right lung, abutting the loculated right pleural effusion, similar, compatible with pleural-parenchymal scarring. Patchy debris narrowing the lobar bronchus in the right lower lobe, similar. No acute consolidative airspace disease, lung masses or significant pulmonary nodules. Musculoskeletal: No aggressive appearing focal osseous lesions. Moderate to marked thoracic spondylosis. No fracture in chest. CT ABDOMEN PELVIS FINDINGS Hepatobiliary: Normal liver size. Simple 2.5 cm left liver dome cyst. No additional liver lesions. Normal gallbladder with no radiopaque cholelithiasis. No biliary ductal dilatation. Pancreas: Normal, with no mass or duct dilation. Spleen: Normal size. No mass. Adrenals/Urinary Tract: Normal adrenals. Staghorn calculus replacing the right renal  pelvis and much of mid to lower right renal collecting system, measuring up to 40 x 23 mm in the right renal pelvis. Scattered gas throughout right renal collecting system. Mild right pelvicaliectasis. Nonobstructing 17 mm upper left renal stone. Punctate nonobstructing lower left renal stone. No left hydronephrosis. Complex 5.7 x 4.4 cm exophytic cystic mass in the posterior interpolar left kidney (series 3/image 80) with circumferential wall thickening and surrounding fat stranding. Simple exophytic 3.1 cm lower left renal cyst. Normal caliber ureters. No ureteral stones. Gas in the nondependent bladder. Otherwise normal bladder with no bladder stones. Stomach/Bowel: Small hiatal hernia. Otherwise normal nondistended stomach. Small left inguinal hernia contains a portion of a small bowel loop. No small bowel dilatation, wall thickening or pneumatosis. Appendix not discretely visualized. Normal large bowel with no diverticulosis, large bowel wall thickening or pericolonic fat stranding.  Vascular/Lymphatic: Atherosclerotic abdominal aorta with 3.3 cm infrarenal abdominal aortic aneurysm. No pathologically enlarged lymph nodes in the abdomen or pelvis. Reproductive: Top normal size prostate with nonspecific coarse internal prostatic calcifications. Other: No pneumoperitoneum, ascites or focal fluid collection. Musculoskeletal: No aggressive appearing focal osseous lesions. Comminuted moderate burst fracture of L1 vertebral body with approximately 50% loss of vertebral body height. Bony retropulsion up to 5 mm. No additional fractures. Marked lumbar spondylosis, most prominent at L3-4. IMPRESSION: 1. Moderate comminuted burst fracture of L1 vertebral body with approximately 50% loss of vertebral body height, probably acute. Bony retropulsion up to 5 mm. No additional fractures. 2. Indeterminate complex 5.7 cm thick-walled exophytic cystic mass in the posterior interpolar left kidney surrounding fat stranding, cannot exclude renal cell carcinoma, with the differential including traumatic hemorrhage within a benign renal cyst. Recommend further characterization with renal mass protocol MRI (preferred) or CT abdomen without and with IV contrast, which may be performed at this time or on a short term outpatient basis as clinically warranted. 3. Large right staghorn calculus with mild right pelvicaliectasis. Scattered gas throughout the right renal collecting system and nondependent bladder, presumably due to recent instrumentation. Nonobstructing left nephrolithiasis. 4. Small loculated right pleural effusion with smooth mild right pleural thickening, unchanged since 10/14/2019 chest CT. 5. Left main and 3 vessel coronary atherosclerosis. 6. Small hiatal hernia. 7. Small left inguinal hernia contains a portion of a small bowel loop. No evidence of acute bowel complication. 8. Aortic Atherosclerosis (ICD10-I70.0) and Emphysema (ICD10-J43.9). Electronically Signed: By: Delbert PhenixJason A Poff M.D. On: 01/24/2021 18:23     Procedures Procedures   Medications Ordered in ED Medications  cefTRIAXone (ROCEPHIN) 1 g in sodium chloride 0.9 % 100 mL IVPB (has no administration in time range)  sodium chloride 0.9 % bolus 500 mL (has no administration in time range)    ED Course  I have reviewed the triage vital signs and the nursing notes.  Pertinent labs & imaging results that were available during my care of the patient were reviewed by me and considered in my medical decision making (see chart for details).    MDM Rules/Calculators/A&P                          Patient presents with generalized weakness and increased falls due to episodic dizziness. Reportedly hypotensive at urgent care prior to arrival in the ED.  No hypotension noted here in the ED.  Afebrile, nontoxic-appearing, maintains excellent SPO2 on room air, no apparent distress.  I personally reviewed and interpreted his labs and imaging studies. Multiple findings on imaging studies including renal  mass with hemorrhage, L1 burst fracture, abdominal aortic aneurysm, pleural effusion. Patient also has evidence of likely UTI on UA.  Findings and plan of care discussed with attending physician, Mancel Bale, MD. Dr. Effie Shy personally evaluated and examined this patient. At the end of my shift, Dr. Effie Shy continued care for the patient.   Final Clinical Impression(s) / ED Diagnoses Final diagnoses:  AKI (acute kidney injury) (HCC)  Urinary tract infection with hematuria, site unspecified  Generalized weakness  Renal mass, left  Closed burst fracture of lumbar vertebra, initial encounter (HCC)  Abdominal aortic aneurysm (AAA) without rupture [facility])    Rx / DC Orders ED Discharge Orders    None       Concepcion Living 01/24/21 1930    Mancel Bale, MD 01/24/21 2311

## 2021-01-24 NOTE — ED Provider Notes (Signed)
Emergency Medicine Provider Triage Evaluation Note  Alfred Mercado , a 85 y.o. male  was evaluated in triage.  Sent from UC for concern for possible recent stroke. Has been feeling generally weak with frequent falls. Fell on Saturday however unsure why he fell. Hit his back; unsure if he hit his head however is on a blood thinner per UC. Pt lives by himself. Is hard of hearing and is unsure why he feels bad.  Review of Systems  Positive: + generalized weakness, frequent falls Negative: - HA, chest pain, SOB  Physical Exam  BP (!) 104/58 (BP Location: Left Arm)   Pulse 85   Temp 97.9 F (36.6 C) (Oral)   Resp 18   Ht 6\' 1"  (1.854 m)   Wt 79.4 kg   SpO2 99%   BMI 23.09 kg/m  Gen:   Awake, no distress   Resp:  Normal effort  MSK:   Moves extremities without difficulty  Other:  Neuro intact.   Medical Decision Making  Medically screening exam initiated at 1:30 PM.  Appropriate orders placed.  Alfred Mercado was informed that the remainder of the evaluation will be completed by another provider, this initial triage assessment does not replace that evaluation, and the importance of remaining in the ED until their evaluation is complete.     Burnadette Pop, PA-C 01/24/21 1332    01/26/21, MD 01/24/21 1348

## 2021-01-24 NOTE — ED Notes (Addendum)
Patient transported to MRI 

## 2021-01-24 NOTE — ED Notes (Signed)
Pt in MRI.

## 2021-01-24 NOTE — ED Triage Notes (Signed)
Per ems, pt coming from urgent care for generalized weakness and increased falls over the past few days. BP at urgent care was low also concerned about ekg changes. Hx of AFIB. A&Ox4 at this time. Pt keeps stating "I just don't feel good." Pt reports some back pain from falling, denies hitting head. Takes Eliquis.

## 2021-01-25 ENCOUNTER — Inpatient Hospital Stay (HOSPITAL_COMMUNITY): Payer: Medicare Other

## 2021-01-25 ENCOUNTER — Encounter (HOSPITAL_COMMUNITY): Payer: Self-pay | Admitting: Internal Medicine

## 2021-01-25 DIAGNOSIS — N390 Urinary tract infection, site not specified: Secondary | ICD-10-CM

## 2021-01-25 DIAGNOSIS — S32001A Stable burst fracture of unspecified lumbar vertebra, initial encounter for closed fracture: Secondary | ICD-10-CM

## 2021-01-25 DIAGNOSIS — D509 Iron deficiency anemia, unspecified: Secondary | ICD-10-CM | POA: Diagnosis present

## 2021-01-25 DIAGNOSIS — R6520 Severe sepsis without septic shock: Secondary | ICD-10-CM

## 2021-01-25 DIAGNOSIS — N1832 Chronic kidney disease, stage 3b: Secondary | ICD-10-CM

## 2021-01-25 DIAGNOSIS — A419 Sepsis, unspecified organism: Secondary | ICD-10-CM

## 2021-01-25 DIAGNOSIS — R531 Weakness: Secondary | ICD-10-CM

## 2021-01-25 DIAGNOSIS — N200 Calculus of kidney: Secondary | ICD-10-CM

## 2021-01-25 DIAGNOSIS — N179 Acute kidney failure, unspecified: Secondary | ICD-10-CM

## 2021-01-25 DIAGNOSIS — N2889 Other specified disorders of kidney and ureter: Secondary | ICD-10-CM

## 2021-01-25 DIAGNOSIS — R3129 Other microscopic hematuria: Secondary | ICD-10-CM

## 2021-01-25 HISTORY — PX: IR NEPHROSTOMY PLACEMENT RIGHT: IMG6064

## 2021-01-25 HISTORY — PX: IR GUIDED DRAIN W CATHETER PLACEMENT: IMG719

## 2021-01-25 LAB — CBC
HCT: 27.8 % — ABNORMAL LOW (ref 39.0–52.0)
Hemoglobin: 8.5 g/dL — ABNORMAL LOW (ref 13.0–17.0)
MCH: 24.7 pg — ABNORMAL LOW (ref 26.0–34.0)
MCHC: 30.6 g/dL (ref 30.0–36.0)
MCV: 80.8 fL (ref 80.0–100.0)
Platelets: 327 10*3/uL (ref 150–400)
RBC: 3.44 MIL/uL — ABNORMAL LOW (ref 4.22–5.81)
RDW: 14.5 % (ref 11.5–15.5)
WBC: 9.5 10*3/uL (ref 4.0–10.5)
nRBC: 0 % (ref 0.0–0.2)

## 2021-01-25 LAB — IRON AND TIBC
Iron: 33 ug/dL — ABNORMAL LOW (ref 45–182)
Saturation Ratios: 12 % — ABNORMAL LOW (ref 17.9–39.5)
TIBC: 277 ug/dL (ref 250–450)
UIBC: 244 ug/dL

## 2021-01-25 LAB — RESP PANEL BY RT-PCR (FLU A&B, COVID) ARPGX2
Influenza A by PCR: NEGATIVE
Influenza B by PCR: NEGATIVE
SARS Coronavirus 2 by RT PCR: NEGATIVE

## 2021-01-25 LAB — BASIC METABOLIC PANEL
Anion gap: 11 (ref 5–15)
BUN: 24 mg/dL — ABNORMAL HIGH (ref 8–23)
CO2: 22 mmol/L (ref 22–32)
Calcium: 8.7 mg/dL — ABNORMAL LOW (ref 8.9–10.3)
Chloride: 103 mmol/L (ref 98–111)
Creatinine, Ser: 1.88 mg/dL — ABNORMAL HIGH (ref 0.61–1.24)
GFR, Estimated: 35 mL/min — ABNORMAL LOW (ref >60)
Glucose, Bld: 116 mg/dL — ABNORMAL HIGH (ref 70–99)
Potassium: 3.2 mmol/L — ABNORMAL LOW (ref 3.5–5.1)
Sodium: 136 mmol/L (ref 135–145)

## 2021-01-25 LAB — PROTIME-INR
INR: 1.6 — ABNORMAL HIGH (ref 0.8–1.2)
Prothrombin Time: 19.1 seconds — ABNORMAL HIGH (ref 11.4–15.2)

## 2021-01-25 LAB — FERRITIN: Ferritin: 63 ng/mL (ref 24–336)

## 2021-01-25 MED ORDER — FENTANYL CITRATE (PF) 100 MCG/2ML IJ SOLN
INTRAMUSCULAR | Status: AC
  Filled 2021-01-25: qty 2

## 2021-01-25 MED ORDER — FENTANYL CITRATE (PF) 100 MCG/2ML IJ SOLN
INTRAMUSCULAR | Status: AC | PRN
  Administered 2021-01-25 (×2): 12.5 ug via INTRAVENOUS
  Administered 2021-01-25 (×2): 25 ug via INTRAVENOUS
  Administered 2021-01-25 (×2): 12.5 ug via INTRAVENOUS

## 2021-01-25 MED ORDER — CEFAZOLIN SODIUM-DEXTROSE 2-4 GM/100ML-% IV SOLN
INTRAVENOUS | Status: AC
  Filled 2021-01-25: qty 100

## 2021-01-25 MED ORDER — LIDOCAINE HCL 1 % IJ SOLN
INTRAMUSCULAR | Status: AC
  Filled 2021-01-25: qty 20

## 2021-01-25 MED ORDER — OXYCODONE HCL 5 MG PO TABS
5.0000 mg | ORAL_TABLET | ORAL | Status: DC | PRN
  Administered 2021-01-26 – 2021-02-02 (×10): 5 mg via ORAL
  Filled 2021-01-25 (×10): qty 1

## 2021-01-25 MED ORDER — LORAZEPAM 2 MG/ML IJ SOLN
1.0000 mg | Freq: Once | INTRAMUSCULAR | Status: DC
  Filled 2021-01-25: qty 1

## 2021-01-25 MED ORDER — GADOBUTROL 1 MMOL/ML IV SOLN
8.5000 mL | Freq: Once | INTRAVENOUS | Status: AC | PRN
  Administered 2021-01-25: 8.5 mL via INTRAVENOUS

## 2021-01-25 MED ORDER — MIDAZOLAM HCL 2 MG/2ML IJ SOLN
INTRAMUSCULAR | Status: AC
  Filled 2021-01-25: qty 2

## 2021-01-25 MED ORDER — HYDROMORPHONE HCL 1 MG/ML IJ SOLN
0.5000 mg | INTRAMUSCULAR | Status: DC | PRN
  Administered 2021-01-25 – 2021-02-01 (×5): 0.5 mg via INTRAVENOUS
  Filled 2021-01-25 (×5): qty 1

## 2021-01-25 MED ORDER — MIDAZOLAM HCL 2 MG/2ML IJ SOLN
INTRAMUSCULAR | Status: AC | PRN
  Administered 2021-01-25 (×4): 0.5 mg via INTRAVENOUS

## 2021-01-25 MED ORDER — POTASSIUM CHLORIDE CRYS ER 20 MEQ PO TBCR
40.0000 meq | EXTENDED_RELEASE_TABLET | Freq: Once | ORAL | Status: AC
  Administered 2021-01-26: 40 meq via ORAL
  Filled 2021-01-25: qty 2

## 2021-01-25 MED ORDER — CHLORHEXIDINE GLUCONATE CLOTH 2 % EX PADS
6.0000 | MEDICATED_PAD | Freq: Every day | CUTANEOUS | Status: DC
  Administered 2021-01-26 – 2021-02-02 (×7): 6 via TOPICAL

## 2021-01-25 MED ORDER — SODIUM CHLORIDE 0.9 % IV SOLN
INTRAVENOUS | Status: DC | PRN
  Administered 2021-01-25: 250 mL via INTRAVENOUS

## 2021-01-25 MED ORDER — CEFAZOLIN SODIUM-DEXTROSE 2-4 GM/100ML-% IV SOLN
INTRAVENOUS | Status: AC | PRN
  Administered 2021-01-25: 2 g via INTRAVENOUS

## 2021-01-25 MED ORDER — CEFAZOLIN SODIUM-DEXTROSE 2-4 GM/100ML-% IV SOLN
2.0000 g | Freq: Once | INTRAVENOUS | Status: DC
  Filled 2021-01-25: qty 100

## 2021-01-25 MED ORDER — IOHEXOL 300 MG/ML  SOLN
50.0000 mL | Freq: Once | INTRAMUSCULAR | Status: AC | PRN
  Administered 2021-01-25: 15 mL

## 2021-01-25 NOTE — Consult Note (Signed)
I have been asked to see the patient by Dr. Debe Coder, for evaluation and management of right staghorn calculus associated with emphysematous peylitis.  History of present illness: 85 yo man who presented to ED for back pain and weakness after a fall.  CT abd/pelvis showed right staghorn calculus with gas in collecting system and cystic left renal mass. UA also consistent with infection.    Patient hard of hearing and poor historian.  Denies gross hematuria or flank pain.     Review of systems: A 12 point comprehensive review of systems was obtained and is negative unless otherwise stated in the history of present illness.  Patient Active Problem List   Diagnosis Date Noted  . Severe sepsis (HCC) 01/25/2021  . Iron deficiency anemia 01/25/2021  . Acute-on-chronic kidney injury (HCC) 01/24/2021  . Complicated urinary tract infection 01/24/2021  . Left renal mass 01/24/2021  . Staghorn renal calculus 01/24/2021  . Microscopic hematuria 01/24/2021  . Aortic aneurysm (HCC) 01/24/2021  . Closed burst fracture of lumbar vertebra (HCC) 01/24/2021  . Acute respiratory failure with hypoxia (HCC) 08/13/2020  . UTI (urinary tract infection) 06/02/2020  . Community acquired pneumonia of right lower lobe of lung 06/02/2020  . Acute hypoxemic respiratory failure (HCC) 06/02/2020  . Hypokalemia 06/02/2020  . Acute diastolic CHF (congestive heart failure) (HCC) 02/06/2020  . Exertional shortness of breath 10/14/2019  . Atrial fibrillation with RVR (HCC) 10/14/2019  . Pleural effusion   . Chronic heart failure with preserved ejection fraction (HCC)     No current facility-administered medications on file prior to encounter.   Current Outpatient Medications on File Prior to Encounter  Medication Sig Dispense Refill  . acetaminophen (TYLENOL) 325 MG tablet Take 650 mg by mouth every 6 (six) hours as needed for mild pain or headache.    . albuterol (VENTOLIN HFA) 108 (90 Base) MCG/ACT inhaler  Inhale 2 puffs into the lungs every 6 (six) hours as needed for wheezing or shortness of breath. 18 g 6  . apixaban (ELIQUIS) 5 MG TABS tablet Take 1 tablet (5 mg total) by mouth 2 (two) times daily. 60 tablet 6  . bisacodyl (DULCOLAX) 5 MG EC tablet Take 20 mg by mouth daily as needed for moderate constipation.    Marland Kitchen diltiazem (CARDIZEM SR) 90 MG 12 hr capsule Take 1 capsule (90 mg total) by mouth 2 (two) times daily. 30 capsule 6  . furosemide (LASIX) 40 MG tablet Take 1 tablet (40 mg total) by mouth See admin instructions. (Patient taking differently: Take 40 mg by mouth daily.) 30 tablet 6  . metoprolol tartrate (LOPRESSOR) 25 MG tablet TAKE 1 TABLET(25 MG) BY MOUTH TWICE DAILY (Patient taking differently: Take 25 mg by mouth 2 (two) times daily.) 60 tablet 6  . Misc. Devices MISC Power wheelchair.  Diagnosis atrial fibrillation, gait abnormality 1 each 0  . Tiotropium Bromide Monohydrate (SPIRIVA RESPIMAT) 2.5 MCG/ACT AERS INHALE 2 PUFFS BY MOUTH DAILY (Patient taking differently: Inhale 2 puffs into the lungs daily. INHALE 2 PUFFS BY MOUTH DAILY) 4 g 6  . Vitamin D, Ergocalciferol, (DRISDOL) 1.25 MG (50000 UNIT) CAPS capsule TAKE 1 CAPSULE BY MOUTH 1 TIME A WEEK (Patient taking differently: Take 50,000 Units by mouth every 7 (seven) days.) 12 capsule 0  . carbamide peroxide (DEBROX) 6.5 % OTIC solution Place 5 drops into the right ear 2 (two) times daily. (Patient not taking: Reported on 01/24/2021) 15 mL 1  . nicotine (NICODERM CQ - DOSED IN MG/24  HOURS) 21 mg/24hr patch Place 1 patch (21 mg total) onto the skin daily. (Patient not taking: Reported on 01/24/2021) 28 patch 0  . nicotine (NICODERM CQ - DOSED IN MG/24 HOURS) 21 mg/24hr patch PLACE 1 PATCH (21 MG TOTAL) ONTO THE SKIN DAILY. (Patient not taking: Reported on 01/24/2021) 28 patch 0  . polyethylene glycol powder (GLYCOLAX/MIRALAX) 17 GM/SCOOP powder Take 17 g by mouth daily. (Patient not taking: Reported on 01/24/2021) 3350 g 1    Past  Medical History:  Diagnosis Date  . Acute heart failure with preserved ejection fraction (HCC)   . Atrial fibrillation with rapid ventricular response (HCC) 07/11/2019    Past Surgical History:  Procedure Laterality Date  . APPENDECTOMY      Social History   Tobacco Use  . Smoking status: Light Tobacco Smoker  . Smokeless tobacco: Never Used  Substance Use Topics  . Alcohol use: Not Currently  . Drug use: Never    Family History  Problem Relation Age of Onset  . Aneurysm Father     PE: Vitals:   01/25/21 0100 01/25/21 0115 01/25/21 0130 01/25/21 0524  BP: 120/63 (!) 121/59 118/73 129/69  Pulse: 98 82 99 93  Resp: (!) 27 20 (!) 28 17  Temp:      TempSrc:      SpO2: 97% 97% 99% 93%  Weight:      Height:       Patient appears to be in no acute distress  patient is alert and oriented x3 Atraumatic normocephalic head No cervical or supraclavicular lymphadenopathy appreciated No increased work of breathing, no audible wheezes/rhonchi Regular sinus rhythm/rate Abdomen is soft, nontender, distended, no CVA or suprapubic tenderness Uncirc phallus, foreskin in proper anatomic position, foley catheter draining yellow urine with debris Lower extremities are symmetric without appreciable edema Grossly neurologically intact No identifiable skin lesions  Recent Labs    01/24/21 1323 01/25/21 0222  WBC 9.7 9.5  HGB 9.8* 8.5*  HCT 31.3* 27.8*   Recent Labs    01/24/21 1323 01/25/21 0222  NA 136 136  K 3.2* 3.2*  CL 101 103  CO2 24 22  GLUCOSE 120* 116*  BUN 24* 24*  CREATININE 2.11* 1.88*  CALCIUM 8.9 8.7*   No results for input(s): LABPT, INR in the last 72 hours. No results for input(s): LABURIN in the last 72 hours. Results for orders placed or performed during the hospital encounter of 01/24/21  Resp Panel by RT-PCR (Flu A&B, Covid) Nasopharyngeal Swab     Status: None   Collection Time: 01/25/21 12:09 AM   Specimen: Nasopharyngeal Swab;  Nasopharyngeal(NP) swabs in vial transport medium  Result Value Ref Range Status   SARS Coronavirus 2 by RT PCR NEGATIVE NEGATIVE Final    Comment: (NOTE) SARS-CoV-2 target nucleic acids are NOT DETECTED.  The SARS-CoV-2 RNA is generally detectable in upper respiratory specimens during the acute phase of infection. The lowest concentration of SARS-CoV-2 viral copies this assay can detect is 138 copies/mL. A negative result does not preclude SARS-Cov-2 infection and should not be used as the sole basis for treatment or other patient management decisions. A negative result may occur with  improper specimen collection/handling, submission of specimen other than nasopharyngeal swab, presence of viral mutation(s) within the areas targeted by this assay, and inadequate number of viral copies(<138 copies/mL). A negative result must be combined with clinical observations, patient history, and epidemiological information. The expected result is Negative.  Fact Sheet for Patients:  BloggerCourse.com  Fact Sheet for Healthcare Providers:  SeriousBroker.it  This test is no t yet approved or cleared by the [place] FDA and  has been authorized for detection and/or diagnosis of SARS-CoV-2 by FDA under an Emergency Use Authorization (EUA). This EUA will remain  in effect (meaning this test can be used) for the duration of the COVID-19 declaration under Section 564(b)(1) of the Act, 21 U.S.C.section 360bbb-3(b)(1), unless the authorization is terminated  or revoked sooner.       Influenza A by PCR NEGATIVE NEGATIVE Final   Influenza B by PCR NEGATIVE NEGATIVE Final    Comment: (NOTE) The Xpert Xpress SARS-CoV-2/FLU/RSV plus assay is intended as an aid in the diagnosis of influenza from Nasopharyngeal swab specimens and should not be used as a sole basis for treatment. Nasal washings and aspirates are unacceptable for Xpert Xpress  SARS-CoV-2/FLU/RSV testing.  Fact Sheet for Patients: BloggerCourse.com  Fact Sheet for Healthcare Providers: SeriousBroker.it  This test is not yet approved or cleared by the [place] FDA and has been authorized for detection and/or diagnosis of SARS-CoV-2 by FDA under an Emergency Use Authorization (EUA). This EUA will remain in effect (meaning this test can be used) for the duration of the COVID-19 declaration under Section 564(b)(1) of the Act, 21 U.S.C. section 360bbb-3(b)(1), unless the authorization is terminated or revoked.  Performed at [facility], [address], [place], [place] [address]     Imaging: CT Abd/Pelvis 01/25/21 Adrenals/Urinary Tract: Normal adrenals. Staghorn calculus replacing the right renal pelvis and much of mid to lower right renal collecting system, measuring up to 40 x 23 mm in the right renal pelvis. Scattered gas throughout right renal collecting system. Mild right pelvicaliectasis. Nonobstructing 17 mm upper left renal stone. Punctate nonobstructing lower left renal stone. No left hydronephrosis. Complex 5.7 x 4.4 cm exophytic cystic mass in the posterior interpolar left kidney (series 3/image 80) with circumferential wall thickening and surrounding fat stranding. Simple exophytic 3.1 cm lower left renal cyst. Normal caliber ureters. No ureteral stones. Gas in the nondependent bladder. Otherwise normal bladder with no bladder stones.  Imp/Recommendations: Urology recs: 1. Place foley to decompress bladder 2. NPO at midnight and hold blood thinners 3. Empiric antibiotics based on last positive culture results 4. Consult IR for RIGHT PCN placement given staghorn stone in presence of gas in collecting system  5. MRI kidney to further assess left renal mass  Thank you for involving me in this patient's care.  Please page with any further questions or concerns. Lashawn Orrego D Aubryanna Nesheim

## 2021-01-25 NOTE — ED Notes (Signed)
Report called  

## 2021-01-25 NOTE — H&P (Signed)
Chief Complaint: Patient was seen in consultation today for image guided right percutaneous nephrostomy tube placement  Chief Complaint  Patient presents with  . Weakness   at the request of Dr. Arita Miss, Judie Petit.   Referring Physician(s): Dr. Arita Miss, M.  Supervising Physician: Malachy Moan  Patient Status: [facility] - In-pt  History of Present Illness: Spike Desilets is a 85 y.o. male with PMH s of a fib on Eliquis, and acute HF who presented to [facility] ED for back pain after a fall and weakness. CT head was negative for acute intracranial abnormalities. Patient underwent  CT C/A/P which showed:  1. Moderate comminuted burst fracture of L1 vertebral body with approximately 50% loss of vertebral body height, probably acute. Bony retropulsion up to 5 mm. No additional fractures. 2. Indeterminate complex 5.7 cm thick-walled exophytic cystic mass in the posterior interpolar left kidney surrounding fat stranding, cannot exclude renal cell carcinoma, with the differential including traumatic hemorrhage within a benign renal cyst. Recommend further characterization with renal mass protocol MRI (preferred) or CT abdomen without and with IV contrast, which may be performed at this time or on a short term outpatient basis as clinically warranted. 3. Large right staghorn calculus with mild right pelvicaliectasis. Scattered gas throughout the right renal collecting system and nondependent bladder, presumably due to recent instrumentation. Nonobstructing left nephrolithiasis. 4. Small loculated right pleural effusion with smooth mild right pleural thickening, unchanged since 10/14/2019 chest CT. 5. Left main and 3 vessel coronary atherosclerosis. 6. Small hiatal hernia. 7. Small left inguinal hernia contains a portion of a small bowel loop. No evidence of acute bowel complication. 8. Aortic Atherosclerosis (ICD10-I70.0) and Emphysema (ICD10-J43.9).  Patient also showed evidence of UTI, urology was  consulted due to above findings, who recommended a right percutaneous nephrostomy tube placement due to staghorn calculus with scattered gas throughout the right renal collecting system.  After thorough discussion with the urology team, patient decided to proceed with the procedure.  IR was requested for image guided right percutaneous nephrostomy tube placement.  Patient laying in bed, not in acute distress.  Denise headache, fever, chills, shortness of breath, cough, chest pain, abdominal pain, nausea ,vomiting, and bleeding.    Past Medical History:  Diagnosis Date  . Acute heart failure with preserved ejection fraction (HCC)   . Atrial fibrillation with rapid ventricular response (HCC) 07/11/2019    Past Surgical History:  Procedure Laterality Date  . APPENDECTOMY      Allergies: Patient has no known allergies.  Medications: Prior to Admission medications   Medication Sig Start Date End Date Taking? Authorizing Provider  acetaminophen (TYLENOL) 325 MG tablet Take 650 mg by mouth every 6 (six) hours as needed for mild pain or headache.   Yes [provider]  albuterol (VENTOLIN HFA) 108 (90 Base) MCG/ACT inhaler Inhale 2 puffs into the lungs every 6 (six) hours as needed for wheezing or shortness of breath. 11/30/20  Yes Hoy Register, MD  apixaban (ELIQUIS) 5 MG TABS tablet Take 1 tablet (5 mg total) by mouth 2 (two) times daily. 11/30/20  Yes Hoy Register, MD  bisacodyl (DULCOLAX) 5 MG EC tablet Take 20 mg by mouth daily as needed for moderate constipation.   Yes [provider]  diltiazem (CARDIZEM SR) 90 MG 12 hr capsule Take 1 capsule (90 mg total) by mouth 2 (two) times daily. 11/30/20  Yes Hoy Register, MD  furosemide (LASIX) 40 MG tablet Take 1 tablet (40 mg total) by mouth See admin instructions. Patient  taking differently: Take 40 mg by mouth daily. 11/30/20  Yes Hoy Register, MD  metoprolol tartrate (LOPRESSOR) 25 MG tablet TAKE 1 TABLET(25 MG) BY  MOUTH TWICE DAILY Patient taking differently: Take 25 mg by mouth 2 (two) times daily. 11/30/20  Yes Hoy Register, MD  Misc. Devices MISC Power wheelchair.  Diagnosis atrial fibrillation, gait abnormality 12/01/20  Yes Newlin, Odette Horns, MD  Tiotropium Bromide Monohydrate (SPIRIVA RESPIMAT) 2.5 MCG/ACT AERS INHALE 2 PUFFS BY MOUTH DAILY Patient taking differently: Inhale 2 puffs into the lungs daily. INHALE 2 PUFFS BY MOUTH DAILY 11/30/20  Yes Newlin, Odette Horns, MD  Vitamin D, Ergocalciferol, (DRISDOL) 1.25 MG (50000 UNIT) CAPS capsule TAKE 1 CAPSULE BY MOUTH 1 TIME A WEEK Patient taking differently: Take 50,000 Units by mouth every 7 (seven) days. 12/01/20  Yes Hoy Register, MD  carbamide peroxide (DEBROX) 6.5 % OTIC solution Place 5 drops into the right ear 2 (two) times daily. Patient not taking: Reported on 01/24/2021 11/30/20   Hoy Register, MD  nicotine (NICODERM CQ - DOSED IN MG/24 HOURS) 21 mg/24hr patch Place 1 patch (21 mg total) onto the skin daily. Patient not taking: Reported on 01/24/2021 06/07/20   Hoy Register, MD  nicotine (NICODERM CQ - DOSED IN MG/24 HOURS) 21 mg/24hr patch PLACE 1 PATCH (21 MG TOTAL) ONTO THE SKIN DAILY. Patient not taking: Reported on 01/24/2021 06/06/20 06/06/21  Dolan Amen, MD  polyethylene glycol powder [facility]) 17 GM/SCOOP powder Take 17 g by mouth daily. Patient not taking: Reported on 01/24/2021 03/02/20   Hoy Register, MD     Family History  Problem Relation Age of Onset  . Aneurysm Father     Social History   Socioeconomic History  . Marital status: Divorced    Spouse name: Not on file  . Number of children: Not on file  . Years of education: Not on file  . Highest education level: Not on file  Occupational History  . Not on file  Tobacco Use  . Smoking status: Light Tobacco Smoker  . Smokeless tobacco: Never Used  Substance and Sexual Activity  . Alcohol use: Not Currently  . Drug use: Never  . Sexual activity: Not  Currently  Other Topics Concern  . Not on file  Social History Narrative  . Not on file   Social Determinants of Health   Financial Resource Strain: Not on file  Food Insecurity: No Food Insecurity  . Worried About Programme researcher, broadcasting/film/video in the Last Year: Never true  . Ran Out of Food in the Last Year: Never true  Transportation Needs: No Transportation Needs  . Lack of Transportation (Medical): No  . Lack of Transportation (Non-Medical): No  Physical Activity: Not on file  Stress: Not on file  Social Connections: Not on file     Review of Systems: A 12 point ROS discussed and pertinent positives are indicated in the HPI above.  All other systems are negative.   Vital Signs: BP 129/69 (BP Location: Left Arm)   Pulse 93   Temp 97.6 F (36.4 C) (Oral)   Resp 17   Ht 6\' 1"  (1.854 m)   Wt 180 lb 8.9 oz (81.9 kg)   SpO2 93%   BMI 23.82 kg/m   Physical Exam Vitals reviewed.  Constitutional:      General: He is not in acute distress.    Appearance: He is ill-appearing.  HENT:     Head: Normocephalic and atraumatic.     Mouth/Throat:  Mouth: Mucous membranes are moist.  Cardiovascular:     Rate and Rhythm: Normal rate and regular rhythm.     Pulses: Normal pulses.     Heart sounds: Normal heart sounds.  Pulmonary:     Effort: Pulmonary effort is normal. No respiratory distress.     Breath sounds: Normal breath sounds.  Abdominal:     General: Bowel sounds are normal.     Palpations: Abdomen is soft.  Skin:    General: Skin is warm and dry.     Coloration: Skin is not jaundiced or pale.  Neurological:     Mental Status: He is oriented to person, place, and time.  Psychiatric:        Mood and Affect: Mood normal.        Behavior: Behavior normal.        Judgment: Judgment normal.     MD Evaluation Airway: WNL Heart: WNL Abdomen: WNL Chest/ Lungs: WNL ASA  Classification: 3 Mallampati/Airway Score: Two  Imaging: CT ABDOMEN PELVIS WO  CONTRAST  Addendum Date: 01/24/2021   ADDENDUM REPORT: 01/24/2021 20:28 ADDENDUM: Additional clinical history was provided of abnormal urinalysis concerning for acute urinary tract infection, and clinical presentation concerning for sepsis. In light of this additional clinical history, the differential for the bladder and right renal collecting system gas includes acute gas-producing cystitis and ascending right pyelitis. In addition, the differential for the complex thick-walled exophytic cystic posterior interpolar left renal mass includes a renal abscess. The recommendation for further characterization of the left renal mass with MRI (preferred) or CT abdomen without and with IV contrast is unchanged. These results were called by telephone at the time of interpretation on 01/24/2021 at 8:27 pm to provider DR. CHRISTIAN, who verbally acknowledged these results. Electronically Signed   By: Delbert Phenix M.D.   On: 01/24/2021 20:28   Addendum Date: 01/24/2021   ADDENDUM REPORT: 01/24/2021 18:56 ADDENDUM: 9. Infrarenal 3.3 cm abdominal aortic aneurysm. Recommend follow-up ultrasound every 3 years. This recommendation follows ACR consensus guidelines: White Paper of the ACR Incidental Findings Committee II on Vascular Findings. J Am Coll Radiol 2013; 10:789-794. Electronically Signed   By: Delbert Phenix M.D.   On: 01/24/2021 18:56   Result Date: 01/24/2021 CLINICAL DATA:  Abnormal chest radiograph. Recent fall with back pain. EXAM: CT CHEST, ABDOMEN AND PELVIS WITHOUT CONTRAST TECHNIQUE: Multidetector CT imaging of the chest, abdomen and pelvis was performed following the standard protocol without IV contrast. COMPARISON:  Chest radiograph from earlier today. 10/14/2019 chest CT. FINDINGS: CT CHEST FINDINGS Cardiovascular: Normal heart size. No significant pericardial effusion/thickening. Left main and 3 vessel coronary atherosclerosis. Atherosclerotic nonaneurysmal thoracic aorta. Stable top-normal caliber main  pulmonary artery (3.0 cm diameter). Mediastinum/Nodes: No discrete thyroid nodules. Unremarkable esophagus. No pathologically enlarged axillary, mediastinal or hilar lymph nodes, noting limited sensitivity for the detection of hilar adenopathy on this noncontrast study. Lungs/Pleura: No pneumothorax. Small loculated right pleural effusion with smooth mild right pleural thickening, unchanged since 10/14/2019 chest CT. No left pleural effusion. Moderate centrilobular emphysema with diffuse bronchial wall thickening. Thick irregular bandlike consolidation with associated volume loss and mild surrounding distortion and thin parenchymal banding throughout the periphery of the mid to lower right lung, abutting the loculated right pleural effusion, similar, compatible with pleural-parenchymal scarring. Patchy debris narrowing the lobar bronchus in the right lower lobe, similar. No acute consolidative airspace disease, lung masses or significant pulmonary nodules. Musculoskeletal: No aggressive appearing focal osseous lesions. Moderate to marked thoracic spondylosis. No fracture  in chest. CT ABDOMEN PELVIS FINDINGS Hepatobiliary: Normal liver size. Simple 2.5 cm left liver dome cyst. No additional liver lesions. Normal gallbladder with no radiopaque cholelithiasis. No biliary ductal dilatation. Pancreas: Normal, with no mass or duct dilation. Spleen: Normal size. No mass. Adrenals/Urinary Tract: Normal adrenals. Staghorn calculus replacing the right renal pelvis and much of mid to lower right renal collecting system, measuring up to 40 x 23 mm in the right renal pelvis. Scattered gas throughout right renal collecting system. Mild right pelvicaliectasis. Nonobstructing 17 mm upper left renal stone. Punctate nonobstructing lower left renal stone. No left hydronephrosis. Complex 5.7 x 4.4 cm exophytic cystic mass in the posterior interpolar left kidney (series 3/image 80) with circumferential wall thickening and surrounding  fat stranding. Simple exophytic 3.1 cm lower left renal cyst. Normal caliber ureters. No ureteral stones. Gas in the nondependent bladder. Otherwise normal bladder with no bladder stones. Stomach/Bowel: Small hiatal hernia. Otherwise normal nondistended stomach. Small left inguinal hernia contains a portion of a small bowel loop. No small bowel dilatation, wall thickening or pneumatosis. Appendix not discretely visualized. Normal large bowel with no diverticulosis, large bowel wall thickening or pericolonic fat stranding. Vascular/Lymphatic: Atherosclerotic abdominal aorta with 3.3 cm infrarenal abdominal aortic aneurysm. No pathologically enlarged lymph nodes in the abdomen or pelvis. Reproductive: Top normal size prostate with nonspecific coarse internal prostatic calcifications. Other: No pneumoperitoneum, ascites or focal fluid collection. Musculoskeletal: No aggressive appearing focal osseous lesions. Comminuted moderate burst fracture of L1 vertebral body with approximately 50% loss of vertebral body height. Bony retropulsion up to 5 mm. No additional fractures. Marked lumbar spondylosis, most prominent at L3-4. IMPRESSION: 1. Moderate comminuted burst fracture of L1 vertebral body with approximately 50% loss of vertebral body height, probably acute. Bony retropulsion up to 5 mm. No additional fractures. 2. Indeterminate complex 5.7 cm thick-walled exophytic cystic mass in the posterior interpolar left kidney surrounding fat stranding, cannot exclude renal cell carcinoma, with the differential including traumatic hemorrhage within a benign renal cyst. Recommend further characterization with renal mass protocol MRI (preferred) or CT abdomen without and with IV contrast, which may be performed at this time or on a short term outpatient basis as clinically warranted. 3. Large right staghorn calculus with mild right pelvicaliectasis. Scattered gas throughout the right renal collecting system and nondependent  bladder, presumably due to recent instrumentation. Nonobstructing left nephrolithiasis. 4. Small loculated right pleural effusion with smooth mild right pleural thickening, unchanged since 10/14/2019 chest CT. 5. Left main and 3 vessel coronary atherosclerosis. 6. Small hiatal hernia. 7. Small left inguinal hernia contains a portion of a small bowel loop. No evidence of acute bowel complication. 8. Aortic Atherosclerosis (ICD10-I70.0) and Emphysema (ICD10-J43.9). Electronically Signed: By: Delbert PhenixJason A Poff M.D. On: 01/24/2021 18:23   DG Chest 2 View  Result Date: 01/24/2021 CLINICAL DATA:  Weakness with several falls EXAM: CHEST - 2 VIEW COMPARISON:  August 29, 2020 FINDINGS: There is partially loculated pleural effusion on the right with airspace opacity in this region consistent with atelectasis and potential superimposed pneumonia. There is a nodular appearing opacity in the posteromedial aspect of the right lower lobe measuring 3.1 x 1.8 cm. Left lung is clear except for mild left base atelectasis. Heart is upper normal in size with pulmonary vascularity normal. No adenopathy. Probable bone island in the medial right humeral head. IMPRESSION: Partially loculated pleural effusion on the right with atelectasis and potential superimposed pneumonia right base. In the posteromedial right lower lobe region, there is a nodular appearing  opacity measuring 3.1 x 1.8 cm. While this area could represent loculated fluid, a pulmonary mass in this area is of concern. Noncontrast chest CT to further evaluate this area is warranted. Left lung is clear except for mild left base atelectasis. Heart upper normal in size. Electronically Signed   By: Bretta Bang III M.D.   On: 01/24/2021 14:33   CT HEAD WO CONTRAST  Result Date: 01/24/2021 CLINICAL DATA:  Minor head trauma, multiple falls EXAM: CT HEAD WITHOUT CONTRAST TECHNIQUE: Contiguous axial images were obtained from the base of the skull through the vertex without  intravenous contrast. Sagittal and coronal MPR images reconstructed from axial data set. COMPARISON:  None FINDINGS: Brain: Generalized atrophy. Normal ventricular morphology. No midline shift or mass effect. Small vessel chronic ischemic changes of deep cerebral white matter. Low-attenuation LEFT frontotemporal extra-axial collection question sequela of old subdural hematoma versus subdural hygroma. No acute intracranial hemorrhage, mass lesion, or evidence of acute infarction. Vascular: Atherosclerotic calcification of internal carotid and vertebral arteries at skull base. No hyperdense vessels. Skull: Intact Sinuses/Orbits: Clear Other: N/A IMPRESSION: Atrophy with small vessel chronic ischemic changes of deep cerebral white matter. Low-attenuation LEFT frontotemporal extra-axial collection question sequela of old subdural hematoma versus subdural hygroma. No acute intracranial abnormalities. Electronically Signed   By: Ulyses Southward M.D.   On: 01/24/2021 15:24   CT Chest Wo Contrast  Addendum Date: 01/24/2021   ADDENDUM REPORT: 01/24/2021 20:28 ADDENDUM: Additional clinical history was provided of abnormal urinalysis concerning for acute urinary tract infection, and clinical presentation concerning for sepsis. In light of this additional clinical history, the differential for the bladder and right renal collecting system gas includes acute gas-producing cystitis and ascending right pyelitis. In addition, the differential for the complex thick-walled exophytic cystic posterior interpolar left renal mass includes a renal abscess. The recommendation for further characterization of the left renal mass with MRI (preferred) or CT abdomen without and with IV contrast is unchanged. These results were called by telephone at the time of interpretation on 01/24/2021 at 8:27 pm to provider DR. CHRISTIAN, who verbally acknowledged these results. Electronically Signed   By: Delbert Phenix M.D.   On: 01/24/2021 20:28    Addendum Date: 01/24/2021   ADDENDUM REPORT: 01/24/2021 18:56 ADDENDUM: 9. Infrarenal 3.3 cm abdominal aortic aneurysm. Recommend follow-up ultrasound every 3 years. This recommendation follows ACR consensus guidelines: White Paper of the ACR Incidental Findings Committee II on Vascular Findings. J Am Coll Radiol 2013; 10:789-794. Electronically Signed   By: Delbert Phenix M.D.   On: 01/24/2021 18:56   Result Date: 01/24/2021 CLINICAL DATA:  Abnormal chest radiograph. Recent fall with back pain. EXAM: CT CHEST, ABDOMEN AND PELVIS WITHOUT CONTRAST TECHNIQUE: Multidetector CT imaging of the chest, abdomen and pelvis was performed following the standard protocol without IV contrast. COMPARISON:  Chest radiograph from earlier today. 10/14/2019 chest CT. FINDINGS: CT CHEST FINDINGS Cardiovascular: Normal heart size. No significant pericardial effusion/thickening. Left main and 3 vessel coronary atherosclerosis. Atherosclerotic nonaneurysmal thoracic aorta. Stable top-normal caliber main pulmonary artery (3.0 cm diameter). Mediastinum/Nodes: No discrete thyroid nodules. Unremarkable esophagus. No pathologically enlarged axillary, mediastinal or hilar lymph nodes, noting limited sensitivity for the detection of hilar adenopathy on this noncontrast study. Lungs/Pleura: No pneumothorax. Small loculated right pleural effusion with smooth mild right pleural thickening, unchanged since 10/14/2019 chest CT. No left pleural effusion. Moderate centrilobular emphysema with diffuse bronchial wall thickening. Thick irregular bandlike consolidation with associated volume loss and mild surrounding distortion and thin parenchymal banding  throughout the periphery of the mid to lower right lung, abutting the loculated right pleural effusion, similar, compatible with pleural-parenchymal scarring. Patchy debris narrowing the lobar bronchus in the right lower lobe, similar. No acute consolidative airspace disease, lung masses or  significant pulmonary nodules. Musculoskeletal: No aggressive appearing focal osseous lesions. Moderate to marked thoracic spondylosis. No fracture in chest. CT ABDOMEN PELVIS FINDINGS Hepatobiliary: Normal liver size. Simple 2.5 cm left liver dome cyst. No additional liver lesions. Normal gallbladder with no radiopaque cholelithiasis. No biliary ductal dilatation. Pancreas: Normal, with no mass or duct dilation. Spleen: Normal size. No mass. Adrenals/Urinary Tract: Normal adrenals. Staghorn calculus replacing the right renal pelvis and much of mid to lower right renal collecting system, measuring up to 40 x 23 mm in the right renal pelvis. Scattered gas throughout right renal collecting system. Mild right pelvicaliectasis. Nonobstructing 17 mm upper left renal stone. Punctate nonobstructing lower left renal stone. No left hydronephrosis. Complex 5.7 x 4.4 cm exophytic cystic mass in the posterior interpolar left kidney (series 3/image 80) with circumferential wall thickening and surrounding fat stranding. Simple exophytic 3.1 cm lower left renal cyst. Normal caliber ureters. No ureteral stones. Gas in the nondependent bladder. Otherwise normal bladder with no bladder stones. Stomach/Bowel: Small hiatal hernia. Otherwise normal nondistended stomach. Small left inguinal hernia contains a portion of a small bowel loop. No small bowel dilatation, wall thickening or pneumatosis. Appendix not discretely visualized. Normal large bowel with no diverticulosis, large bowel wall thickening or pericolonic fat stranding. Vascular/Lymphatic: Atherosclerotic abdominal aorta with 3.3 cm infrarenal abdominal aortic aneurysm. No pathologically enlarged lymph nodes in the abdomen or pelvis. Reproductive: Top normal size prostate with nonspecific coarse internal prostatic calcifications. Other: No pneumoperitoneum, ascites or focal fluid collection. Musculoskeletal: No aggressive appearing focal osseous lesions. Comminuted moderate  burst fracture of L1 vertebral body with approximately 50% loss of vertebral body height. Bony retropulsion up to 5 mm. No additional fractures. Marked lumbar spondylosis, most prominent at L3-4. IMPRESSION: 1. Moderate comminuted burst fracture of L1 vertebral body with approximately 50% loss of vertebral body height, probably acute. Bony retropulsion up to 5 mm. No additional fractures. 2. Indeterminate complex 5.7 cm thick-walled exophytic cystic mass in the posterior interpolar left kidney surrounding fat stranding, cannot exclude renal cell carcinoma, with the differential including traumatic hemorrhage within a benign renal cyst. Recommend further characterization with renal mass protocol MRI (preferred) or CT abdomen without and with IV contrast, which may be performed at this time or on a short term outpatient basis as clinically warranted. 3. Large right staghorn calculus with mild right pelvicaliectasis. Scattered gas throughout the right renal collecting system and nondependent bladder, presumably due to recent instrumentation. Nonobstructing left nephrolithiasis. 4. Small loculated right pleural effusion with smooth mild right pleural thickening, unchanged since 10/14/2019 chest CT. 5. Left main and 3 vessel coronary atherosclerosis. 6. Small hiatal hernia. 7. Small left inguinal hernia contains a portion of a small bowel loop. No evidence of acute bowel complication. 8. Aortic Atherosclerosis (ICD10-I70.0) and Emphysema (ICD10-J43.9). Electronically Signed: By: Delbert Phenix M.D. On: 01/24/2021 18:23   MR LUMBAR SPINE WO CONTRAST  Result Date: 01/24/2021 CLINICAL DATA:  Compression fracture of the lumbar spine EXAM: MRI LUMBAR SPINE WITHOUT CONTRAST TECHNIQUE: Multiplanar, multisequence MR imaging of the lumbar spine was performed. No intravenous contrast was administered. COMPARISON:  None. FINDINGS: Segmentation:  Standard Alignment:  Normal Vertebrae: Burst fracture of L1 with small osteonecrotic  cleft, likely subacute. Mild bone marrow edema. Approximately 50% central  height loss with 4-5 mm of retropulsion. Modic type 3 endplate changes at L3-4. Conus medullaris and cauda equina: Conus extends to the L1 level. Conus and cauda equina appear normal. Paraspinal and other soft tissues: Please see report for dedicated CT of the abdomen and pelvis performed the same day Disc levels: T12-L1: Narrowing of the ventral thecal sac due to L1 retropulsion but no spinal canal stenosis. L1-L2: Normal disc space and facets. No spinal canal or neuroforaminal stenosis. L2-L3: Disc space narrowing without herniation. No spinal canal or neural foraminal stenosis. L3-L4: Right asymmetric disc bulge with superimposed extraforaminal protrusion. Mild right foraminal narrowing with possible contact of the exiting right L3 nerve root. No spinal canal or left neural foraminal stenosis. L4-L5: Right asymmetric disc bulge. Mild narrowing of the right lateral recess. No central spinal canal or neural foraminal stenosis. L5-S1: Normal disc space and facets. No spinal canal or neuroforaminal stenosis. Visualized sacrum: Normal. IMPRESSION: 1. Burst fracture of L1 with 50% central height loss and 4-5 mm of retropulsion. Mild bone marrow edema. This is likely subacute. 2. Mild right L3-4 neural foraminal narrowing due to right asymmetric disc bulge with superimposed extraforaminal protrusion. Correlate for right L3 radiculopathy. 3. Mild narrowing of the right lateral recess at L4-L5, which may serve as a source of right L5 radiculopathy. Electronically Signed   By: Deatra Robinson M.D.   On: 01/24/2021 23:41    Labs:  CBC: Recent Labs    08/29/20 1537 11/30/20 1202 01/24/21 1323 01/25/21 0222  WBC 6.4 8.2 9.7 9.5  HGB 10.9* 10.1* 9.8* 8.5*  HCT 33.1* 30.6* 31.3* 27.8*  PLT 281 346 395 327    COAGS: No results for input(s): INR, APTT in the last 8760 hours.  BMP: Recent Labs    02/06/20 0850 02/07/20 0919  02/08/20 0640 02/09/20 0709 06/02/20 0853 08/15/20 0054 08/29/20 1537 11/30/20 1202 01/24/21 1323 01/25/21 0222  NA 140 139 139 139   < > 135 139 140 136 136  K 3.7 3.6 3.3* 3.6   < > 3.2* 3.0* 4.1 3.2* 3.2*  CL 102 100 101 101   < > 100 103 100 101 103  CO2 < > GLUCOSE 129* 175* 114* 123*   < > 115* 115* 101* 120* 116*  BUN < > 24* 24*  CALCIUM 8.8* 9.1 8.9 8.9   < > 8.3* 8.4* 8.9 8.9 8.7*  CREATININE 1.18 1.27* 1.22 1.22   < > 1.41* 1.20 1.28* 2.11* 1.88*  GFRNONAA 57* 52* 55* 55*   < > 50* >60  --  30* 35*  GFRAA >60 >60 >60 >60  --   --   --   --   --   --    < > = values in this interval not displayed.    LIVER FUNCTION TESTS: Recent Labs    02/06/20 1308 06/03/20 0331 01/24/21 2012  BILITOT 1.1 1.5* 0.6  AST 19 14* 14*  ALT ALKPHOS 76 54 63  PROT 6.1* 5.2* 6.3*  ALBUMIN 3.4* 2.6* 2.8*    TUMOR MARKERS: No results for input(s): AFPTM, CEA, CA199, CHROMGRNA in the last 8760 hours.  Assessment and Plan: 85 y.o. male with UTI and incidental finding of right staghorn calculus with scattered gas in the right renal collecting system.  Urology was consulted who recommended a right percutaneous nephrostomy tube placement.  After thorough discussion and shared decision making, patient decided to proceed with the procedure. IR was requested for image guided right percutaneous nephrostomy tube placement. Case was reviewed and approved by Dr. Archer Asa. N.p.o. since midnight VSS CBC stable, no leukocytosis, hgb 9.8, plt 395  RF BUN 24, creatinine 2.11, GFR less than 30  STAT INR ordered at 0651 hrs, still pending at 0957 hrs. Notified ED and floor RN that INR needs to be drawn and resulted prior to the procedure Last Eliquis on 5/30   Risks and benefits of right PCN placement was discussed with the patient including, but not limited to, infection, bleeding, significant bleeding causing loss or decrease in  renal function or damage to adjacent structures.   All of the patient's questions were answered, patient is agreeable to proceed.  Consent signed and in IR binder.   Thank you for this interesting consult.  I greatly enjoyed meeting Bader Stubblefield and look forward to participating in their care.  A copy of this report was sent to the requesting provider on this date.  Electronically Signed: Willette Brace, PA-C 01/25/2021, 9:36 AM   I spent a total of 20 Minutes   in face to face in clinical consultation, greater than 50% of which was counseling/coordinating care for right [facility] tube placement.

## 2021-01-25 NOTE — Progress Notes (Signed)
Orthopedic Tech Progress Note Patient Details:  Alfred Mercado [birth year] [id number] Dropped off a TLSO BRACE to patient room. To be worn when patient is up out of bed. patient has a lot of discomfort to belly on right side. TLSO can be converted to a LSO in case they need a different brace. Ortho Devices Type of Ortho Device: Other (comment) Ortho Device/Splint Location: BACK Ortho Device/Splint Interventions: Ordered,Adjustment   Post Interventions Patient Tolerated: Fair,Poor Instructions Provided: Care of device   Donald Pore 01/25/2021, 9:54 AM

## 2021-01-25 NOTE — Hospital Course (Addendum)
Chronic issues:   #Infrarenal aortic aneurysm 3.3 cm aneurysm noted on CT chest. - Recommend U/S follow-up in 3 years   #L1 burst fracture with retropulsion  Several falls leading into admission. CT and MRI noted finding. - X-rays in 1 month   #Interpolar R renal cortical lesion Indeterminate tiny 0.9cm lesion on MRI performed for above reasons. - Radiology recommends f/u MRI with and without contrast in 3-6 months for concern that this lesion could be a neoplasm   #Skin lesion Worrisome for skin cancer.

## 2021-01-25 NOTE — ED Notes (Signed)
Lab unable to obtain second set of blood cultures. Will attempt blood draw.

## 2021-01-25 NOTE — ED Notes (Signed)
Lab on their way for blood cultures; MD would like catheter placement for distended bladder. Will place and collect urine culture.

## 2021-01-25 NOTE — Procedures (Signed)
Pre Procedure Dx: Right sided stag horn calculus; Indeterminate left sided perinephric collection/mass Post Procedural Dx: Same; Confirming left sided perinephric abscess  Successful [place] and fluoroscopic guided placement of a right sided PCN with end coiled and locked within the renal pelvis. Urine sample sent to lab for analysis PCN connected to gravity bag.  Successful US guided 10 fr drain placement into left sided perinephric abscess yielding 50 cc of purulent material. Sample sent to lab for analysis. Drain connected to JP bulb.   EBL: None Complications: None immediate.  Katherina Right, MD Pager #: [phone]

## 2021-01-25 NOTE — Consult Note (Signed)
   Providing Compassionate, Quality Care - Together  Neurosurgery Consult  Referring physician: Dr. Criselda Peaches Reason for referral: L1 fracture  Chief Complaint: Generalized weakness  History of Present Illness: This is an 85 year old male with a history of A. fib on Eliquis, pleural effusion, tobacco abuse, vitamin D deficiency that complains of recent falls and generalized weakness.  Work-up revealed a L1 burst fracture with subacute healing.  At this time he denies any significant back pain, "is unsure if he has significant back pain".  He denies any numbness or tingling in his legs or groin.  He denies any weakness.  He does state he has some difficulty going from sitting to standing due to his back but he is unsure if it is due to pain.  He does complain of some recent falls but is unsure of the etiology.  He denies any bowel or bladder changes, denies any saddle anesthesia.  Medications: I have reviewed the patient's current medications. Allergies: No Known Allergies  History reviewed. No pertinent family history. Social History:  has no history on file for tobacco use, alcohol use, and drug use.  ROS: All pertinent positives and negatives are listed in HPI above.  Physical Exam:  Vital signs in last 24 hours: Temp:  [98 F (36.7 C)-98.3 F (36.8 C)] 98 F (36.7 C) (07/25 1814) Pulse Rate:  [58-128] 65 (07/26 0746) Resp:  [11-18] 14 (07/26 0217) BP: (138-182)/(65-125) 153/88 (07/26 0700) SpO2:  [91 %-98 %] 96 % (07/26 0746) PE: Awake alert oriented x2 PERRLA EOMI No acute distress Somewhat cachectic appearing Full strength in all extremities Sensory intact light touch Follows commands x4 Cranial nerves II through XII intact   Impression/Assessment:  85 year old male with  1.  Subacute L1 burst fracture with no significant stenosis/instability  Plan:  -Imaging reviewed, recommend TLSO brace for pain control.  He can wear this when out of bed -No acute neurosurgical  intervention -We will have the patient follow-up in approximately 1 month for repeat x-rays -Further work-up per primary for kidney mass   Thank you for allowing me to participate in this patient's care.  Please do not hesitate to call with questions or concerns.   Monia Pouch, DO Neurosurgeon [facility] Cell: [phone]

## 2021-01-25 NOTE — Sedation Documentation (Signed)
Patient sats 96% on room air. Per Dr. Grace Isaac give 0.5 mg of versed prior to moving patient onto abdomen.

## 2021-01-25 NOTE — Progress Notes (Signed)
Order for Back brace when out of bed  Call [facility] [phone] for assistance.

## 2021-01-25 NOTE — Progress Notes (Signed)
Subjective: Admitted overnight. No acute events.  [name] was seen in the ED this morning. He was doing better overall with some reported abdominal/back pain. He was informed on his conditions and plan for the day. He mostly complained of increased thirst from being NPO (he was then given ice chips). Overall, he is stable and ready for his operation.   Objective:  Vital signs in last 24 hours: Vitals:   01/25/21 0115 01/25/21 0130 01/25/21 0524 01/25/21 0922  BP: (!) 121/59 118/73 129/69   Pulse: 82 99 93   Resp: 20 (!) 28 17   Temp:      TempSrc:      SpO2: 97% 99% 93%   Weight:    81.9 kg  Height:    6\' 1"  (1.854 m)   Weight change:   Intake/Output Summary (Last 24 hours) at 01/25/2021 1150 Last data filed at 01/25/2021 0252 Gross per 24 hour  Intake 500 ml  Output --  Net 500 ml   Physical Exam Constitutional:      General: He is not in acute distress.    Appearance: He is not toxic-appearing.     Comments: Elderly man resting in bed, conversational, no acute distress  Cardiovascular:     Rate and Rhythm: Normal rate and regular rhythm.     Heart sounds: No murmur heard.   Pulmonary:     Effort: Pulmonary effort is normal.     Comments: Anterior lung sounds normal Abdominal:     General: Abdomen is flat.     Palpations: Abdomen is soft.     Tenderness: There is no right CVA tenderness, left CVA tenderness, guarding or rebound.     Comments: Mild tenderness on left side of abdomen with palpation  Skin:    General: Skin is warm and dry.     Comments: Lesion with hyperpigmented spotting on L cheek  Neurological:     Comments: 4/5 strength on L hip flexion as compared to 5/5 on R hip flexion    Assessment/Plan:  Principal Problem:   Complicated urinary tract infection Active Problems:   Hypokalemia   Acute-on-chronic kidney injury (HCC)   Left renal mass   Staghorn renal calculus   Microscopic hematuria   Aortic aneurysm (HCC)   Closed burst  fracture of lumbar vertebra (HCC)   Severe sepsis (HCC)   Iron deficiency anemia  This is hospital day #2 for [name], an 85 year old man with history of HFpEF (06/2019 EF 50-55%), atrial fibrillation on Eliquis, right pleural effusion s/p thoracentesis, heavy tobacco use, suspected COPD, and vitamin D deficiency who presented with generalized weakness and recent falls.  #Complicated UTI with left pyelonephritis abscess He has a history of MDRO with UTI. His UA had many bacteria and WBC but was negative for nitrites; as such, a resistant organism may be at play. There was an initial concern for sepsis given hypotension and tachycardia on admission but less likely given he has improved after two 500cc fluid boluses in the setting of being afebrile and without leukocytosis or an oxygen requirement. CT concerning for a mass that is now confirmed to be an abscess s/p drainage by IR. As such, no further imaging to be collected. 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 UA significant for >50 RBC/hpf which could be in part or fully due to his staghorn calculus.  - Urology consulted - Foley placed for bladder decompression - Percutaneous nephrostomy tube placed  #AKI on CKD IIIa, improving He presented with an AKI on top of known CKD IIIa. Likely mix of pre-renal and obstructive etiology given dry/volume down on exam with known staghorn calculus and mass.  Cr 2.11 > 1.88, BUN 24 > 24, GFR 30 > 35 - s/p two 500cc boluses - BMP tomorrow  #Hypokalemia - K 3.2 > 3.2 - Oral replacement today  #L1 burst fracture with retropulsion  Several falls leading into admission.  CT and MRI noted finding. - Neurosurgery consulted: no acute interventions planned, want X-rays in 1 month  - PT/OT consulted  - Brace placed   #Anemia, stable He has a history of chronic anemia with a baseline of 10-11. Iron studies suggestive of iron deficiency anemia with low iron, low saturation, and low-normal ferritin.  - Hgb 9.8 > 8.5 - Consider iron replacement  #HFpEF, stable #Right pleural effusion, stable Follows with Dr. Elease Hashimoto. No evidence of exacerbation at this time. History of pleural effusion on right side likely related to HFpEF history. Noted on CT chest during this admission. No SOB or acute changes/findings at this time.  - Diltiazem, metoprolol, and lasix currently held; consider restarting once hypotension improves  #Atrial fibrillation, stable Follows with Dr. Elease Hashimoto. On Eliquis. CHADS-VASC of 7. - Holding eliquis today for procedure - will restart tomorrow - HR in normal range  #COPD, stable #Tobacco use disorder  No wheezing on examination. Now smoking 3-4 cigs/day. - Cont Spiriva and albuterol  - Cont nicotine patch   #Infrarenal aortic aneurysm 3.3 cm aneurysm noted on CT chest. - Recommend U/S follow-up in 3 years  #Extra-axial frontotemporal lesion Head CT demonstrated old subdural hematoma vs subdural hygroma. - No headaches - Continue to monitor    LOS: 1 day   Elita Quick, Medical Student 01/25/2021, 11:50 AM

## 2021-01-25 NOTE — Progress Notes (Signed)
PT Cancellation Note  Patient Details Name: Alfred Mercado MRN: [id number] DOB: 08/24/1936   Cancelled Treatment:    Reason Eval/Treat Not Completed: (P) Patient at procedure or test/unavailable Pt is off the floor in radiology. PT will follow back for Evaluation as able.  [name] Beverely Risen PT, DPT Acute Rehabilitation Services Pager [phone] Office [phone]    [name] 01/25/2021, 1:01 PM

## 2021-01-25 NOTE — Progress Notes (Signed)
NEW ADMISSION NOTE New Admission Note:   Arrival Method: Stretcher Mental Orientation:  A&O x4 hard of hearing Telemetry: no Assessment: Completed Skin: eccymosis arms legs back IV: left arm and right arm Pain: reports none Tubes: foley catheter Safety Measures: Safety Fall Prevention Plan has been given, discussed and signed Admission: Completed [facility] Orientation: Patient has been orientated to the room, unit and staff.  Family:  Orders have been reviewed and implemented. Will continue to monitor the patient. Call light has been placed within reach and bed alarm has been activated.   Velia Meyer, RN

## 2021-01-25 NOTE — ED Notes (Signed)
Per Alphonzo Severance, MD collect Blood Cultures even tho Antibiotics were given.

## 2021-01-26 ENCOUNTER — Telehealth: Payer: Self-pay | Admitting: Family Medicine

## 2021-01-26 LAB — CBC
HCT: 25.7 % — ABNORMAL LOW (ref 39.0–52.0)
Hemoglobin: 8.1 g/dL — ABNORMAL LOW (ref 13.0–17.0)
MCH: 25 pg — ABNORMAL LOW (ref 26.0–34.0)
MCHC: 31.5 g/dL (ref 30.0–36.0)
MCV: 79.3 fL — ABNORMAL LOW (ref 80.0–100.0)
Platelets: 261 10*3/uL (ref 150–400)
RBC: 3.24 MIL/uL — ABNORMAL LOW (ref 4.22–5.81)
RDW: 14.6 % (ref 11.5–15.5)
WBC: 9.6 10*3/uL (ref 4.0–10.5)
nRBC: 0 % (ref 0.0–0.2)

## 2021-01-26 LAB — URINE CULTURE

## 2021-01-26 LAB — BASIC METABOLIC PANEL
Anion gap: 11 (ref 5–15)
BUN: 20 mg/dL (ref 8–23)
CO2: 20 mmol/L — ABNORMAL LOW (ref 22–32)
Calcium: 8.4 mg/dL — ABNORMAL LOW (ref 8.9–10.3)
Chloride: 103 mmol/L (ref 98–111)
Creatinine, Ser: 1.63 mg/dL — ABNORMAL HIGH (ref 0.61–1.24)
GFR, Estimated: 42 mL/min — ABNORMAL LOW (ref >60)
Glucose, Bld: 112 mg/dL — ABNORMAL HIGH (ref 70–99)
Potassium: 3.1 mmol/L — ABNORMAL LOW (ref 3.5–5.1)
Sodium: 134 mmol/L — ABNORMAL LOW (ref 135–145)

## 2021-01-26 LAB — HEMOGLOBIN A1C
Hgb A1c MFr Bld: 6.1 % — ABNORMAL HIGH (ref 4.8–5.6)
Mean Plasma Glucose: 128 mg/dL

## 2021-01-26 LAB — GLUCOSE, CAPILLARY: Glucose-Capillary: 120 mg/dL — ABNORMAL HIGH (ref 70–99)

## 2021-01-26 LAB — MAGNESIUM: Magnesium: 1.8 mg/dL (ref 1.7–2.4)

## 2021-01-26 MED ORDER — TAMSULOSIN HCL 0.4 MG PO CAPS
0.4000 mg | ORAL_CAPSULE | Freq: Every day | ORAL | Status: DC
  Administered 2021-01-26 – 2021-02-02 (×8): 0.4 mg via ORAL
  Filled 2021-01-26 (×8): qty 1

## 2021-01-26 MED ORDER — APIXABAN 2.5 MG PO TABS
2.5000 mg | ORAL_TABLET | Freq: Two times a day (BID) | ORAL | Status: DC
  Administered 2021-01-26 – 2021-01-29 (×7): 2.5 mg via ORAL
  Filled 2021-01-26 (×7): qty 1

## 2021-01-26 MED ORDER — POLYVINYL ALCOHOL 1.4 % OP SOLN
1.0000 [drp] | OPHTHALMIC | Status: DC | PRN
  Administered 2021-01-26 – 2021-01-30 (×2): 1 [drp] via OPHTHALMIC
  Filled 2021-01-26: qty 15

## 2021-01-26 MED ORDER — APIXABAN 5 MG PO TABS: 5.0000 mg | ORAL_TABLET | Freq: Two times a day (BID) | ORAL | Status: DC

## 2021-01-26 MED ORDER — POTASSIUM CHLORIDE CRYS ER 20 MEQ PO TBCR
40.0000 meq | EXTENDED_RELEASE_TABLET | Freq: Once | ORAL | Status: DC
  Filled 2021-01-26: qty 2

## 2021-01-26 MED ORDER — METOPROLOL TARTRATE 25 MG PO TABS
25.0000 mg | ORAL_TABLET | Freq: Two times a day (BID) | ORAL | Status: DC
  Administered 2021-01-26 – 2021-02-02 (×14): 25 mg via ORAL
  Filled 2021-01-26 (×15): qty 1

## 2021-01-26 NOTE — Telephone Encounter (Signed)
FYI,  Patient currently admitted in hospital.

## 2021-01-26 NOTE — Telephone Encounter (Unsigned)
Copied from CRM [phone]. Topic: General - Other >> Jan 24, 2021  9:11 AM Jaquita Rector A wrote: Reason for CRM: Patient called in to inform Dr Alvis Lemmings that he fell over the weekend and feel pain upon getting to a standing position and is going to ER. Would like a call back please   Ph# [phone] # [phone]

## 2021-01-26 NOTE — Evaluation (Signed)
Occupational Therapy Evaluation Patient Details Name: Alfred Mercado MRN: [id number] DOB: 12/20/1935 Today's Date: 01/26/2021    History of Present Illness 85 year old man who presented with progressive weakness and falls for a few weeks. Hitting his back (lumbar burst fracture present on imaging), also remembers a fall where he may have lost consciousness and he had difficulty getting up. In ED found to have comminuted L1 burst fracture with retropulsion  Admitted 01/24/21 for treatment of Complicated urinary tract infection with possible pyelonephritic abscess, Left Complex cystic renal mass, Right Staghorn renal calculus with emphysematous features in the right renal collecting system.  PMH of HFpEF, Afib on eliquis, right pleural effusion s/p thoracentesis, tobacco use   Clinical Impression   Pt presents with decline in function and safety with ADLs and ADL mobility with impaired strength, balance and endurance. PTA pt living in motel room on ground floor. Pt HOH. Pt reports that he was Ind with ADLs/selfcare and ambulated in his room holding on to the furniture and used a cane in the community andand his neighbors assist with bringing him food and cleaning up his room. Pt reports multiple falls in the last month one of which resulted in his L1 fx.  Pt is adamant about being/remaing independent and wishes to return to his room. Pt currently requires min A for bed mobility, mod - max A with LB ADLs, modA x2 for transfers and min A for ambulation of 3 feet with cane. Pt would benefit from acute OT services to address impairments to maximize level of function and safety   Follow Up Recommendations  Home health OT;Other (comment) [facility] aide)    Equipment Recommendations  Tub/shower seat;Other (comment) Nature conservation officer)    Recommendations for Other Services       Precautions / Restrictions Precautions Precautions: Fall Precaution Comments: 2-3 falls in last month, dizziness, Required Braces or  Orthoses: Spinal Brace Spinal Brace: Thoracolumbosacral orthotic Spinal Brace Comments: has TLSO in room that was to be used for comfort with OOB, however now he has 2 drains coming out of his back making it painful and difficulty to place brace Restrictions Weight Bearing Restrictions: No      Mobility Bed Mobility Overal bed mobility: Needs Assistance Bed Mobility: Rolling;Sidelying to Sit Rolling: Min assist Sidelying to sit: Min assist       General bed mobility comments: maximal vc for log rolling, management of LE off bed and especially for bringing trunk to upright, min physical assist to bring trunk to upright    Transfers Overall transfer level: Needs assistance Equipment used: Straight cane Transfers: Sit to/from Stand Sit to Stand: Mod assist;+2 safety/equipment         General transfer comment: mod A for power up and steadying in standing, +2 assist for management of catheter and drains    Balance Overall balance assessment: Needs assistance Sitting-balance support: Feet supported;No upper extremity supported Sitting balance-[name]: Fair     Standing balance support: During functional activity;Single extremity supported Standing balance-[name]: Poor Standing balance comment: requires at least single UE support to maintain balance                           ADL either performed or assessed with clinical judgement   ADL Overall ADL's : Needs assistance/impaired Eating/Feeding: Set up;Independent;Sitting   Grooming: Wash/dry hands;Wash/dry face;Min guard;Sitting   Upper Body Bathing: Min guard;Sitting   Lower Body Bathing: Maximal assistance   Upper Body Dressing :  Sitting   Lower Body Dressing: Maximal assistance   Toilet Transfer: Moderate assistance;+2 for physical assistance;+2 for safety/equipment;Ambulation;RW;Cueing for safety;Cueing for sequencing;Stand-pivot Toilet Transfer Details (indicate cue type and reason): simulated  to recliner Toileting- Clothing Manipulation and Hygiene: Moderate assistance;Sit to/from stand       Functional mobility during ADLs: Moderate assistance;+2 for physical assistance;+2 for safety/equipment;Cueing for safety;Cueing for sequencing;Rolling walker General ADL Comments: pt educated on log roll to get to EOB, body mechanics     Vision Baseline Vision/History: Wears glasses Patient Visual Report: No change from baseline       Perception     Praxis      Pertinent Vitals/Pain Pain Assessment: Faces Faces Pain Scale: Hurts even more Pain Location: back with getting out of bed Pain Descriptors / Indicators: Grimacing;Guarding;Discomfort Pain Intervention(s): Limited activity within patient's tolerance;Monitored during session;Repositioned     Hand Dominance Right   Extremity/Trunk Assessment Upper Extremity Assessment Upper Extremity Assessment: Generalized weakness   Lower Extremity Assessment Lower Extremity Assessment: Defer to PT evaluation   Cervical / Trunk Assessment Cervical / Trunk Assessment: Other exceptions Cervical / Trunk Exceptions: L1 burst compression fx   Communication Communication Communication: HOH   Cognition Arousal/Alertness: Awake/alert Behavior During Therapy: WFL for tasks assessed/performed;Restless Overall Cognitive Status: Impaired/Different from baseline Area of Impairment: Safety/judgement;Problem solving                         Safety/Judgement: Decreased awareness of safety;Decreased awareness of deficits   Problem Solving: Slow processing;Requires verbal cues     General Comments  pt requesting eye wash, RN notified, pt very adamant about going back home    Exercises     Shoulder Instructions      Home Living Family/patient expects to be discharged to:: Private residence Living Arrangements: Alone Available Help at Discharge: Friend(s);Available PRN/intermittently Type of Home: Other(Comment)  (motel) Home Access: Level entry     Home Layout: One level     Bathroom Shower/Tub: Chief Strategy Officer: Handicapped height     Home Equipment: Cane - single point          Prior Functioning/Environment Level of Independence: Needs assistance  Gait / Transfers Assistance Needed: furniture surfs, ambulates very short distances with cane ADL's / Homemaking Assistance Needed: Pt reports that he was Ind with ADLs/selfcare and that neighbors help with occasional housekeeping, food brought by delivery, or friends   Comments: very attached to his community smokes 3-4 cigarettes a day        OT Problem List: Decreased strength;Impaired balance (sitting and/or standing);Decreased cognition;Pain;Decreased knowledge of precautions;Decreased safety awareness;Decreased activity tolerance;Decreased coordination;Decreased knowledge of use of DME or AE      OT Treatment/Interventions: Self-care/ADL training;DME and/or AE instruction;Therapeutic activities;Balance training;Therapeutic exercise;Patient/family education    OT Goals(Current goals can be found in the care plan section) Acute Rehab OT Goals Patient Stated Goal: go back home OT Goal Formulation: With patient Time For Goal Achievement: 02/09/21 Potential to Achieve Goals: Good ADL Goals Pt Will Perform Grooming: with min guard assist;with supervision;with set-up;standing Pt Will Perform Upper Body Bathing: with supervision;with set-up;sitting Pt Will Perform Lower Body Bathing: with min assist;with adaptive equipment Pt Will Perform Upper Body Dressing: with supervision;with set-up;sitting Pt Will Perform Lower Body Dressing: with min assist;with adaptive equipment Pt Will Transfer to Toilet: with mod assist;with min assist;ambulating;stand pivot transfer Pt Will Perform Toileting - Clothing Manipulation and hygiene: with min assist;with min guard assist;sit to/from stand Pt  Will Perform Tub/Shower Transfer: with  mod assist;with min assist;ambulating;shower seat;3 in 1;grab bars;rolling walker  OT Frequency: Min 2X/week   Barriers to D/C:            Co-evaluation              AM-PAC OT "6 Clicks" Daily Activity     Outcome Measure Help from another person eating meals?: None Help from another person taking care of personal grooming?: A Little Help from another person toileting, which includes using toliet, bedpan, or urinal?: A Lot Help from another person bathing (including washing, rinsing, drying)?: A Lot Help from another person to put on and taking off regular upper body clothing?: A Little Help from another person to put on and taking off regular lower body clothing?: A Lot 6 Click Score: 16   End of Session Equipment Utilized During Treatment: Gait belt;Other (comment) (cane) Nurse Communication: Mobility status  Activity Tolerance: Patient limited by pain;Patient limited by fatigue Patient left: in chair;with call bell/phone within reach;with chair alarm set  OT Visit Diagnosis: Unsteadiness on feet (R26.81);Other abnormalities of gait and mobility (R26.89);Muscle weakness (generalized) (M62.81);History of falling (Z91.81);Pain;Other symptoms and signs involving cognitive function Pain - part of body:  (back)                Time: 1224-4975 OT Time Calculation (min): 36 min Charges:  OT General Charges $OT Visit: 1 Visit OT Evaluation $OT Eval Moderate Complexity: 1 Mod    Galen Manila 01/26/2021, 2:37 PM

## 2021-01-26 NOTE — Progress Notes (Signed)
Chart review:  Patient underwent R PCN tube placement into right kidney yesterday for staghorn stone with gas.  Also, left perinephric drain for perinephric abscess.  -Continue foley catheter and b/l PCN drains. -Continue antibiotics tailored to urine culture/perinephric drain cultures -Start flomax 0.4mg  daily

## 2021-01-26 NOTE — Progress Notes (Addendum)
Subjective: Given KCl overnight. No acute events.  [name] is in mild pain this morning. He discussed pain of his abdomen/back, particularly on the right in the area of his nephrostomy tube. He stated the procedure went well. He also complains of some R eye pain this morning. He is unable to characterize the pain or sensation.   Objective:  Vital signs in last 24 hours: Vitals:   01/25/21 1300 01/25/21 1322 01/25/21 2120 01/26/21 0953  BP: 122/75 116/69 130/80 122/69  Pulse: 95 (!) 103 (!) 110 (!) 106  Resp: 20 16 18 18   Temp:   98 F (36.7 C) 98.7 F (37.1 C)  TempSrc:   Oral   SpO2: 100% 93% 95% 96%  Weight:      Height:       Weight change: 2.521 kg  Intake/Output Summary (Last 24 hours) at 01/26/2021 1320 Last data filed at 01/26/2021 1242 Gross per 24 hour  Intake 1257.15 ml  Output 1240 ml  Net 17.15 ml   Physical Exam Constitutional:      Comments: Elderly man resting in bed, seems mildly uncomfortable  Eyes:     General: No scleral icterus.    Extraocular Movements: Extraocular movements intact.     Conjunctiva/sclera: Conjunctivae normal.     Pupils: Pupils are equal, round, and reactive to light.     Comments: R eye with mild increased tearing, no notable abrasion or abnormal discharge  Cardiovascular:     Rate and Rhythm: Normal rate and regular rhythm.     Heart sounds: No murmur heard.   Pulmonary:     Effort: Pulmonary effort is normal.     Breath sounds: Normal breath sounds.  Abdominal:     General: Abdomen is flat.     Palpations: Abdomen is soft.     Tenderness: There is abdominal tenderness.     Comments: Mild tenderness with palpation, difficult to illicit exact location  Genitourinary:    Penis: Normal.      Comments: Foley in place, draining dark urine R nephrostomy tube in place, draining dark urine L JP bulb in place Skin:    General: Skin is warm and dry.    Assessment/Plan:  Principal Problem:   Complicated urinary tract  infection Active Problems:   Hypokalemia   Acute-on-chronic kidney injury (HCC)   Left renal mass   Staghorn renal calculus   Microscopic hematuria   Aortic aneurysm (HCC)   Closed burst fracture of lumbar vertebra (HCC)   Severe sepsis (HCC)   Iron deficiency anemia   Generalized weakness  This is hospital day #3 for Mr. 03/28/2021, an20 year old man with history of HFpEF (06/2019 EF 50-55%) with associated right pleural effusion s/p thoracentesis, atrial fibrillation on Eliquis, heavy tobacco use, COPD, and vitamin D deficiency who presented with generalized weakness and recent falls.  #Complicated UTI with left pyelonephritis abscess Urine culture demonstrated multiple organisms with "recommendation to repeat." This was collected after several hours of antibiotics so no plan to repeat at this time. BC with NGTD. Initial concern for sepsis but less likely given BP intermittently low (recent reading of 107/54 and 122/69) that has improved with two fluid boluses. In addition, he remains afebrile with a normal WBC and mild intermittent tachycardia. Abscess confirmed yesterday after IR drained 50cc of purulent fluid and then also by MRI.  - Urology consulted   - s/p 50cc of purulent drainage yesterday in procedure, cont JP bulb drainage today   - MRI abdomen  yesterday; reading consistent with abscess - Wound culture: WBC with no organisms, reincubated culture for better growth  - Cont Cefepime dosing per pharmacy, day 3/10  #Right staghorn renal calculus with emphysematous features #Microscopic hematuria  No changes since yesterday. Urology and IR on board.  - Urology consulted - Per urology, cont foley for bladder decompression - Per urology, cont percutaneous nephrostomy tube - Per urology, starting Flomax 0.4mg  daily   #Eye pain, new problem Today, he reports R eye pain. He is unable to characterize it, stating "it just hurts." He does not specify whether vision is affected  and states he cannot tell. No abnormal discharge, no changes in color, and no apparent EOM defects. - Start artificial tears and warm compresses    #Interpolar R renal cortical lesion, new problem Indeterminate tiny 0.9cm lesion on MRI performed for above reasons. - Radiology recommends f/u MRI with and without contrast in 3-6 months for concern that this lesion could be a neoplasm   #AKI on CKD IIIa, improving Initially presented with an AKI on top of known CKD IIIa d/t likely pre-renal and obstructive etiology given dry/volume down on exam with known staghorn calculus and mass. He continues to improve after fluids, further suggesting a component of pre-renal etiology.  - Cr 2.11 > 1.88 > 1.63, BUN 24 > 24 > 20, GFR 30 > 35 > 42 - s/p two 500cc boluses; continues to receive other fluids for medications  - Consider mIVF pending ability for PO and renal function  - BMP tomorrow  #Hypokalemia, worsening - K 3.2 > 3.2 > 3.1 - replacement ordered  - BMP tomorrow  #L1 burst fracture with retropulsion  Several falls prior to admission. CT and MRI completed. - Neurosurgery consulted: no acute interventions planned, f/u X-rays in 1 month  - PT/OT consulted  - Brace placed   #Anemia, stable He has a history of chronic anemia with a baseline of 10-11. Iron studies suggestive of iron deficiency anemia with low iron, low saturation, and low-normal ferritin. Further supported by low MCV.  - Hgb 9.8 > 8.5 > 8.1 - CBC tomorrow   #HFpEF, stable #Right pleural effusion, stable Follows with Dr. Elease Hashimoto. History of pleural effusion on right side likely related to HFpEF history that was noted on CT chest during this admission. No SOB or acute changes/findings at this time.  - Diltiazem and lasix currently held; consider restarting once hypotension improves and if HTN begins - Restarting metoprolol 25mg  BID today   #Atrial fibrillation, stable Follows with Dr. . On Eliquis.  CHADS-VASC of 7. - Restarting Eliquis today after procedure yesterday  - HR intermittently tachycardic in low 100s - restarting metoprolol 25mg  BID   #COPD, stable #Tobacco use disorder  No wheezing on examination. Now smoking 3-4 cigs/day. - Cont Spiriva and albuterol  - Cont nicotine patch   #Infrarenal aortic aneurysm 3.3 cm aneurysm noted on CT chest. - Recommend U/S follow-up in 3 years  #Extra-axial frontotemporal lesion Head CT demonstrated old subdural hematoma vs subdural hygroma. - No headaches - Continue to monitor    LOS: 2 days   Elease Hashimoto, Medical Student 01/26/2021, 7:36 AM

## 2021-01-26 NOTE — Evaluation (Signed)
Physical Therapy Evaluation Patient Details Name: Alfred Mercado MRN: [id number] DOB: 10/07/1935 Today's Date: 01/26/2021   History of Present Illness  85 year old man who presented with progressive weakness and falls for a few weeks. Hitting his back (lumbar burst fracture present on imaging), also remembers a fall where he may have lost consciousness and he had difficulty getting up. In ED found to have comminuted L1 burst fracture with retropulsion  Admitted 01/24/21 for treatment of Complicated urinary tract infection with possible pyelonephritic abscess, Left Complex cystic renal mass, Right Staghorn renal calculus with emphysematous features in the right renal collecting system.  PMH of HFpEF, Afib on eliquis, right pleural effusion s/p thoracentesis, tobacco use  Clinical Impression  PTA pt living in motel room with level entry. Pt ambulated in his room holding on to the furniture and used a cane in the community. Pt does endorse multiple falls in the last month one of which resulted in his L1 fx. Pt reports he is independent with bathing and dressing and his neighbors assist with bringing him food and cleaning up his room. Pt is fiercly independent and wishes to return to his room. Pt is currently limited by dizziness in mobility, in presence of decreased strength and balance. Pt is min A for bed mobility, modAx2 for transfers and min A for ambulation of 3 feet with cane. PT recommending HHPT and HHAide to improve pt mobility and self care. PT will continue to follow acutely.     Follow Up Recommendations Home health PT;Supervision - Intermittent    Equipment Recommendations  Other (comment) (Rollator)       Precautions / Restrictions Precautions Precautions: Fall Precaution Comments: 2-3 falls in last month, dizziness, Required Braces or Orthoses: Spinal Brace Spinal Brace: Thoracolumbosacral orthotic Spinal Brace Comments: has TLSO in room that was to be used for comfort with OOB,  however now he has 2 drains coming out of his back making it painful and difficulty to place brace Restrictions Weight Bearing Restrictions: No      Mobility  Bed Mobility Overal bed mobility: Needs Assistance Bed Mobility: Rolling;Sidelying to Sit Rolling: Min assist Sidelying to sit: Min assist       General bed mobility comments: maximal vc for log rolling, management of LE off bed and especially for bringing trunk to upright, min physical assist to bring trunk to upright    Transfers Overall transfer level: Needs assistance Equipment used: Straight cane Transfers: Sit to/from Stand Sit to Stand: Mod assist;+2 safety/equipment         General transfer comment: modA for power up and steadying in standing, +2 assist for management of catheter and drains  Ambulation/Gait Ambulation/Gait assistance: Min assist;+2 safety/equipment Gait Distance (Feet): 3 Feet Assistive device: Straight cane Gait Pattern/deviations: Step-through pattern;Decreased step length - right;Decreased step length - left;Shuffle;Trunk flexed Gait velocity: slowed Gait velocity interpretation: <1.31 ft/sec, indicative of household ambulator General Gait Details: minA for steadying with stepping transfer from bed to chair, slow, shuffling steps, vc for backing up to chair      Balance Overall balance assessment: Needs assistance Sitting-balance support: Feet supported;No upper extremity supported Sitting balance-[name]: Fair     Standing balance support: During functional activity;Single extremity supported Standing balance-[name]: Poor Standing balance comment: requires at least single UE support to maintain balance                   Pertinent Vitals/Pain Pain Assessment: Faces Faces Pain Scale: Hurts even more Pain Location:  back with getting out of bed Pain Descriptors / Indicators: Grimacing;Guarding;Discomfort Pain Intervention(s): Limited activity within patient's  tolerance;Monitored during session;Repositioned;Other (comment) (cued in log rolling to decrease back torsion)    Home Living Family/patient expects to be discharged to:: Private residence Living Arrangements: Alone Available Help at Discharge: Friend(s);Available PRN/intermittently Type of Home: Other(Comment) (motel) Home Access: Level entry     Home Layout: One level Home Equipment: Cane - single point      Prior Function Level of Independence: Needs assistance   Gait / Transfers Assistance Needed: furniture surfs, ambulates very short distances with cane  ADL's / Homemaking Assistance Needed: neighbors help with occasional housekeeping, food brought by delivery, or friends  Comments: very attached to his community smokes 3-4 cigarettes a day     Hand Dominance   Dominant Hand: Right    Extremity/Trunk Assessment   Upper Extremity Assessment Upper Extremity Assessment: Defer to OT evaluation    Lower Extremity Assessment Lower Extremity Assessment: Generalized weakness    Cervical / Trunk Assessment Cervical / Trunk Assessment: Other exceptions Cervical / Trunk Exceptions: L1 burst compression fx  Communication   Communication: HOH  Cognition Arousal/Alertness: Awake/alert Behavior During Therapy: WFL for tasks assessed/performed;Restless Overall Cognitive Status: Impaired/Different from baseline Area of Impairment: Safety/judgement;Problem solving                         Safety/Judgement: Decreased awareness of safety;Decreased awareness of deficits   Problem Solving: Slow processing        General Comments General comments (skin integrity, edema, etc.): pt requesting eye wash, RN notified, pt very adamant about going back home        Assessment/Plan    PT Assessment Patient needs continued PT services  PT Problem List Decreased strength;Decreased activity tolerance;Decreased balance;Decreased mobility;Decreased coordination;Decreased  cognition;Decreased safety awareness;Decreased knowledge of use of DME;Pain       PT Treatment Interventions DME instruction;Gait training;Functional mobility training;Therapeutic activities;Therapeutic exercise;Balance training;Cognitive remediation;Patient/family education    PT Goals (Current goals can be found in the Care Plan section)  Acute Rehab PT Goals Patient Stated Goal: go back home PT Goal Formulation: With patient Time For Goal Achievement: 02/09/21 Potential to Achieve Goals: Fair    Frequency Min 3X/week   Barriers to discharge Decreased caregiver support         AM-PAC PT "6 Clicks" Mobility  Outcome Measure Help needed turning from your back to your side while in a flat bed without using bedrails?: None Help needed moving from lying on your back to sitting on the side of a flat bed without using bedrails?: A Little Help needed moving to and from a bed to a chair (including a wheelchair)?: A Lot Help needed standing up from a chair using your arms (e.g., wheelchair or bedside chair)?: A Lot Help needed to walk in hospital room?: A Little Help needed climbing 3-5 steps with a railing? : Total 6 Click Score: 15    End of Session Equipment Utilized During Treatment: Gait belt Activity Tolerance: Patient tolerated treatment well Patient left: in chair;with call bell/phone within reach;with chair alarm set Nurse Communication: Mobility status;Other (comment) (pt would like his eye wash) PT Visit Diagnosis: Unsteadiness on feet (R26.81);Other abnormalities of gait and mobility (R26.89);Repeated falls (R29.6);Muscle weakness (generalized) (M62.81);History of falling (Z91.81);Difficulty in walking, not elsewhere classified (R26.2);Pain Pain - part of body:  (back)    Time: 7829-5621 PT Time Calculation (min) (ACUTE ONLY): 36 min   Charges:  PT Evaluation $PT Eval Moderate Complexity: 1 Mod          Kade Demicco B. Beverely Risen PT, DPT Acute Rehabilitation  Services Pager [phone] Office [phone]   [name] [facility] 01/26/2021, 12:32 PM

## 2021-01-26 NOTE — Progress Notes (Signed)
Referring Physician(s): Dr. Arita Miss, M.   Supervising Physician: Irish Lack  Patient Status:  [facility] - In-pt  Chief Complaint:  S/p right PCN placement and left renal abscess drain placement with Dr. Grace Isaac.   Subjective:  Pt laying in bed, not in acute distress.  Reports back pain.  No complaints for the PCN and renal abscess drain.   Allergies: Patient has no known allergies.  Medications: Prior to Admission medications   Medication Sig Start Date End Date Taking? Authorizing Provider  acetaminophen (TYLENOL) 325 MG tablet Take 650 mg by mouth every 6 (six) hours as needed for mild pain or headache.   Yes [provider]  albuterol (VENTOLIN HFA) 108 (90 Base) MCG/ACT inhaler Inhale 2 puffs into the lungs every 6 (six) hours as needed for wheezing or shortness of breath. 11/30/20  Yes Hoy Register, MD  apixaban (ELIQUIS) 5 MG TABS tablet Take 1 tablet (5 mg total) by mouth 2 (two) times daily. 11/30/20  Yes Hoy Register, MD  bisacodyl (DULCOLAX) 5 MG EC tablet Take 20 mg by mouth daily as needed for moderate constipation.   Yes [provider]  diltiazem (CARDIZEM SR) 90 MG 12 hr capsule Take 1 capsule (90 mg total) by mouth 2 (two) times daily. 11/30/20  Yes Hoy Register, MD  furosemide (LASIX) 40 MG tablet Take 1 tablet (40 mg total) by mouth See admin instructions. Patient taking differently: Take 40 mg by mouth daily. 11/30/20  Yes Hoy Register, MD  metoprolol tartrate (LOPRESSOR) 25 MG tablet TAKE 1 TABLET(25 MG) BY MOUTH TWICE DAILY Patient taking differently: Take 25 mg by mouth 2 (two) times daily. 11/30/20  Yes Hoy Register, MD  Misc. Devices MISC Power wheelchair.  Diagnosis atrial fibrillation, gait abnormality 12/01/20  Yes Newlin, Odette Horns, MD  Tiotropium Bromide Monohydrate (SPIRIVA RESPIMAT) 2.5 MCG/ACT AERS INHALE 2 PUFFS BY MOUTH DAILY Patient taking differently: Inhale 2 puffs into the lungs daily. INHALE 2 PUFFS BY MOUTH DAILY 11/30/20  Yes  Newlin, Odette Horns, MD  Vitamin D, Ergocalciferol, (DRISDOL) 1.25 MG (50000 UNIT) CAPS capsule TAKE 1 CAPSULE BY MOUTH 1 TIME A WEEK Patient taking differently: Take 50,000 Units by mouth every 7 (seven) days. 12/01/20  Yes Hoy Register, MD  carbamide peroxide (DEBROX) 6.5 % OTIC solution Place 5 drops into the right ear 2 (two) times daily. Patient not taking: Reported on 01/24/2021 11/30/20   Hoy Register, MD  nicotine (NICODERM CQ - DOSED IN MG/24 HOURS) 21 mg/24hr patch Place 1 patch (21 mg total) onto the skin daily. Patient not taking: Reported on 01/24/2021 06/07/20   Hoy Register, MD  nicotine (NICODERM CQ - DOSED IN MG/24 HOURS) 21 mg/24hr patch PLACE 1 PATCH (21 MG TOTAL) ONTO THE SKIN DAILY. Patient not taking: Reported on 01/24/2021 06/06/20 06/06/21  Dolan Amen, MD  polyethylene glycol powder [facility]) 17 GM/SCOOP powder Take 17 g by mouth daily. Patient not taking: Reported on 01/24/2021 03/02/20   Hoy Register, MD     Vital Signs: BP 130/80 (BP Location: Left Arm)   Pulse (!) 110   Temp 98 F (36.7 C) (Oral)   Resp 18   Ht  (1.854 m)   Wt 180 lb 8.9 oz (81.9 kg)   SpO2 95%   BMI 23.82 kg/m   Physical Exam Vitals reviewed.  Constitutional:      General: He is not in acute distress.    Appearance: He is ill-appearing.  HENT:     Head: Normocephalic and atraumatic.  Cardiovascular:     Rate and Rhythm: Tachycardia present.  Pulmonary:     Effort: Pulmonary effort is normal. No respiratory distress.  Abdominal:     General: Abdomen is flat.     Palpations: Abdomen is soft.     Comments: Positive right PCN to a foley bag. Site is unremarkable with no erythema, edema, tenderness, bleeding or drainage. Suture and stat lock in place. Dressing is clean, dry, and intact. 50 ml of cloudy yellow colored fluid noted in the foley bag. Drain aspirates and flushes well.  Positive left flank drain to a suction bulb. Site is unremarkable with no erythema,  edema, tenderness, bleeding or drainage. Suture and stat lock in place. Dressing is clean, dry, and intact. 5 ml of serosanguinous fluid noted in the suction bulb. Drain aspirates and flushes well.   Skin:    General: Skin is warm and dry.     Coloration: Skin is not jaundiced or pale.  Neurological:     Mental Status: He is alert and oriented to person, place, and time.  Psychiatric:        Mood and Affect: Mood normal.        Behavior: Behavior normal.     Imaging: CT ABDOMEN PELVIS WO CONTRAST  Addendum Date: 01/24/2021   ADDENDUM REPORT: 01/24/2021 20:28 ADDENDUM: Additional clinical history was provided of abnormal urinalysis concerning for acute urinary tract infection, and clinical presentation concerning for sepsis. In light of this additional clinical history, the differential for the bladder and right renal collecting system gas includes acute gas-producing cystitis and ascending right pyelitis. In addition, the differential for the complex thick-walled exophytic cystic posterior interpolar left renal mass includes a renal abscess. The recommendation for further characterization of the left renal mass with MRI (preferred) or CT abdomen without and with IV contrast is unchanged. These results were called by telephone at the time of interpretation on 01/24/2021 at 8:27 pm to provider DR. CHRISTIAN, who verbally acknowledged these results. Electronically Signed   By: Delbert Phenix M.D.   On: 01/24/2021 20:28   Addendum Date: 01/24/2021   ADDENDUM REPORT: 01/24/2021 18:56 ADDENDUM: 9. Infrarenal 3.3 cm abdominal aortic aneurysm. Recommend follow-up ultrasound every 3 years. This recommendation follows ACR consensus guidelines: White Paper of the ACR Incidental Findings Committee II on Vascular Findings. J Am Coll Radiol 2013; 10:789-794. Electronically Signed   By: Delbert Phenix M.D.   On: 01/24/2021 18:56   Result Date: 01/24/2021 CLINICAL DATA:  Abnormal chest radiograph. Recent fall with  back pain. EXAM: CT CHEST, ABDOMEN AND PELVIS WITHOUT CONTRAST TECHNIQUE: Multidetector CT imaging of the chest, abdomen and pelvis was performed following the standard protocol without IV contrast. COMPARISON:  Chest radiograph from earlier today. 10/14/2019 chest CT. FINDINGS: CT CHEST FINDINGS Cardiovascular: Normal heart size. No significant pericardial effusion/thickening. Left main and 3 vessel coronary atherosclerosis. Atherosclerotic nonaneurysmal thoracic aorta. Stable top-normal caliber main pulmonary artery (3.0 cm diameter). Mediastinum/Nodes: No discrete thyroid nodules. Unremarkable esophagus. No pathologically enlarged axillary, mediastinal or hilar lymph nodes, noting limited sensitivity for the detection of hilar adenopathy on this noncontrast study. Lungs/Pleura: No pneumothorax. Small loculated right pleural effusion with smooth mild right pleural thickening, unchanged since 10/14/2019 chest CT. No left pleural effusion. Moderate centrilobular emphysema with diffuse bronchial wall thickening. Thick irregular bandlike consolidation with associated volume loss and mild surrounding distortion and thin parenchymal banding throughout the periphery of the mid to lower right lung, abutting the loculated right pleural effusion, similar, compatible with pleural-parenchymal  scarring. Patchy debris narrowing the lobar bronchus in the right lower lobe, similar. No acute consolidative airspace disease, lung masses or significant pulmonary nodules. Musculoskeletal: No aggressive appearing focal osseous lesions. Moderate to marked thoracic spondylosis. No fracture in chest. CT ABDOMEN PELVIS FINDINGS Hepatobiliary: Normal liver size. Simple 2.5 cm left liver dome cyst. No additional liver lesions. Normal gallbladder with no radiopaque cholelithiasis. No biliary ductal dilatation. Pancreas: Normal, with no mass or duct dilation. Spleen: Normal size. No mass. Adrenals/Urinary Tract: Normal adrenals. Staghorn  calculus replacing the right renal pelvis and much of mid to lower right renal collecting system, measuring up to 40 x 23 mm in the right renal pelvis. Scattered gas throughout right renal collecting system. Mild right pelvicaliectasis. Nonobstructing 17 mm upper left renal stone. Punctate nonobstructing lower left renal stone. No left hydronephrosis. Complex 5.7 x 4.4 cm exophytic cystic mass in the posterior interpolar left kidney (series 3/image 80) with circumferential wall thickening and surrounding fat stranding. Simple exophytic 3.1 cm lower left renal cyst. Normal caliber ureters. No ureteral stones. Gas in the nondependent bladder. Otherwise normal bladder with no bladder stones. Stomach/Bowel: Small hiatal hernia. Otherwise normal nondistended stomach. Small left inguinal hernia contains a portion of a small bowel loop. No small bowel dilatation, wall thickening or pneumatosis. Appendix not discretely visualized. Normal large bowel with no diverticulosis, large bowel wall thickening or pericolonic fat stranding. Vascular/Lymphatic: Atherosclerotic abdominal aorta with 3.3 cm infrarenal abdominal aortic aneurysm. No pathologically enlarged lymph nodes in the abdomen or pelvis. Reproductive: Top normal size prostate with nonspecific coarse internal prostatic calcifications. Other: No pneumoperitoneum, ascites or focal fluid collection. Musculoskeletal: No aggressive appearing focal osseous lesions. Comminuted moderate burst fracture of L1 vertebral body with approximately 50% loss of vertebral body height. Bony retropulsion up to 5 mm. No additional fractures. Marked lumbar spondylosis, most prominent at L3-4. IMPRESSION: 1. Moderate comminuted burst fracture of L1 vertebral body with approximately 50% loss of vertebral body height, probably acute. Bony retropulsion up to 5 mm. No additional fractures. 2. Indeterminate complex 5.7 cm thick-walled exophytic cystic mass in the posterior interpolar left kidney  surrounding fat stranding, cannot exclude renal cell carcinoma, with the differential including traumatic hemorrhage within a benign renal cyst. Recommend further characterization with renal mass protocol MRI (preferred) or CT abdomen without and with IV contrast, which may be performed at this time or on a short term outpatient basis as clinically warranted. 3. Large right staghorn calculus with mild right pelvicaliectasis. Scattered gas throughout the right renal collecting system and nondependent bladder, presumably due to recent instrumentation. Nonobstructing left nephrolithiasis. 4. Small loculated right pleural effusion with smooth mild right pleural thickening, unchanged since 10/14/2019 chest CT. 5. Left main and 3 vessel coronary atherosclerosis. 6. Small hiatal hernia. 7. Small left inguinal hernia contains a portion of a small bowel loop. No evidence of acute bowel complication. 8. Aortic Atherosclerosis (ICD10-I70.0) and Emphysema (ICD10-J43.9). Electronically Signed: By: Delbert Phenix M.D. On: 01/24/2021 18:23   DG Chest 2 View  Result Date: 01/24/2021 CLINICAL DATA:  Weakness with several falls EXAM: CHEST - 2 VIEW COMPARISON:  August 29, 2020 FINDINGS: There is partially loculated pleural effusion on the right with airspace opacity in this region consistent with atelectasis and potential superimposed pneumonia. There is a nodular appearing opacity in the posteromedial aspect of the right lower lobe measuring 3.1 x 1.8 cm. Left lung is clear except for mild left base atelectasis. Heart is upper normal in size with pulmonary vascularity normal. No  adenopathy. Probable bone island in the medial right humeral head. IMPRESSION: Partially loculated pleural effusion on the right with atelectasis and potential superimposed pneumonia right base. In the posteromedial right lower lobe region, there is a nodular appearing opacity measuring 3.1 x 1.8 cm. While this area could represent loculated fluid, a  pulmonary mass in this area is of concern. Noncontrast chest CT to further evaluate this area is warranted. Left lung is clear except for mild left base atelectasis. Heart upper normal in size. Electronically Signed   By: Bretta BangWilliam  Woodruff III M.D.   On: 01/24/2021 14:33   CT HEAD WO CONTRAST  Result Date: 01/24/2021 CLINICAL DATA:  Minor head trauma, multiple falls EXAM: CT HEAD WITHOUT CONTRAST TECHNIQUE: Contiguous axial images were obtained from the base of the skull through the vertex without intravenous contrast. Sagittal and coronal MPR images reconstructed from axial data set. COMPARISON:  None FINDINGS: Brain: Generalized atrophy. Normal ventricular morphology. No midline shift or mass effect. Small vessel chronic ischemic changes of deep cerebral white matter. Low-attenuation LEFT frontotemporal extra-axial collection question sequela of old subdural hematoma versus subdural hygroma. No acute intracranial hemorrhage, mass lesion, or evidence of acute infarction. Vascular: Atherosclerotic calcification of internal carotid and vertebral arteries at skull base. No hyperdense vessels. Skull: Intact Sinuses/Orbits: Clear Other: N/A IMPRESSION: Atrophy with small vessel chronic ischemic changes of deep cerebral white matter. Low-attenuation LEFT frontotemporal extra-axial collection question sequela of old subdural hematoma versus subdural hygroma. No acute intracranial abnormalities. Electronically Signed   By: Ulyses SouthwardMark  Boles M.D.   On: 01/24/2021 15:24   CT Chest Wo Contrast  Addendum Date: 01/24/2021   ADDENDUM REPORT: 01/24/2021 20:28 ADDENDUM: Additional clinical history was provided of abnormal urinalysis concerning for acute urinary tract infection, and clinical presentation concerning for sepsis. In light of this additional clinical history, the differential for the bladder and right renal collecting system gas includes acute gas-producing cystitis and ascending right pyelitis. In addition, the  differential for the complex thick-walled exophytic cystic posterior interpolar left renal mass includes a renal abscess. The recommendation for further characterization of the left renal mass with MRI (preferred) or CT abdomen without and with IV contrast is unchanged. These results were called by telephone at the time of interpretation on 01/24/2021 at 8:27 pm to provider DR. CHRISTIAN, who verbally acknowledged these results. Electronically Signed   By: Delbert PhenixJason A Poff M.D.   On: 01/24/2021 20:28   Addendum Date: 01/24/2021   ADDENDUM REPORT: 01/24/2021 18:56 ADDENDUM: 9. Infrarenal 3.3 cm abdominal aortic aneurysm. Recommend follow-up ultrasound every 3 years. This recommendation follows ACR consensus guidelines: White Paper of the ACR Incidental Findings Committee II on Vascular Findings. J Am Coll Radiol 2013; 10:789-794. Electronically Signed   By: Delbert PhenixJason A Poff M.D.   On: 01/24/2021 18:56   Result Date: 01/24/2021 CLINICAL DATA:  Abnormal chest radiograph. Recent fall with back pain. EXAM: CT CHEST, ABDOMEN AND PELVIS WITHOUT CONTRAST TECHNIQUE: Multidetector CT imaging of the chest, abdomen and pelvis was performed following the standard protocol without IV contrast. COMPARISON:  Chest radiograph from earlier today. 10/14/2019 chest CT. FINDINGS: CT CHEST FINDINGS Cardiovascular: Normal heart size. No significant pericardial effusion/thickening. Left main and 3 vessel coronary atherosclerosis. Atherosclerotic nonaneurysmal thoracic aorta. Stable top-normal caliber main pulmonary artery (3.0 cm diameter). Mediastinum/Nodes: No discrete thyroid nodules. Unremarkable esophagus. No pathologically enlarged axillary, mediastinal or hilar lymph nodes, noting limited sensitivity for the detection of hilar adenopathy on this noncontrast study. Lungs/Pleura: No pneumothorax. Small loculated right pleural effusion with  smooth mild right pleural thickening, unchanged since 10/14/2019 chest CT. No left pleural effusion.  Moderate centrilobular emphysema with diffuse bronchial wall thickening. Thick irregular bandlike consolidation with associated volume loss and mild surrounding distortion and thin parenchymal banding throughout the periphery of the mid to lower right lung, abutting the loculated right pleural effusion, similar, compatible with pleural-parenchymal scarring. Patchy debris narrowing the lobar bronchus in the right lower lobe, similar. No acute consolidative airspace disease, lung masses or significant pulmonary nodules. Musculoskeletal: No aggressive appearing focal osseous lesions. Moderate to marked thoracic spondylosis. No fracture in chest. CT ABDOMEN PELVIS FINDINGS Hepatobiliary: Normal liver size. Simple 2.5 cm left liver dome cyst. No additional liver lesions. Normal gallbladder with no radiopaque cholelithiasis. No biliary ductal dilatation. Pancreas: Normal, with no mass or duct dilation. Spleen: Normal size. No mass. Adrenals/Urinary Tract: Normal adrenals. Staghorn calculus replacing the right renal pelvis and much of mid to lower right renal collecting system, measuring up to 40 x 23 mm in the right renal pelvis. Scattered gas throughout right renal collecting system. Mild right pelvicaliectasis. Nonobstructing 17 mm upper left renal stone. Punctate nonobstructing lower left renal stone. No left hydronephrosis. Complex 5.7 x 4.4 cm exophytic cystic mass in the posterior interpolar left kidney (series 3/image 80) with circumferential wall thickening and surrounding fat stranding. Simple exophytic 3.1 cm lower left renal cyst. Normal caliber ureters. No ureteral stones. Gas in the nondependent bladder. Otherwise normal bladder with no bladder stones. Stomach/Bowel: Small hiatal hernia. Otherwise normal nondistended stomach. Small left inguinal hernia contains a portion of a small bowel loop. No small bowel dilatation, wall thickening or pneumatosis. Appendix not discretely visualized. Normal large bowel  with no diverticulosis, large bowel wall thickening or pericolonic fat stranding. Vascular/Lymphatic: Atherosclerotic abdominal aorta with 3.3 cm infrarenal abdominal aortic aneurysm. No pathologically enlarged lymph nodes in the abdomen or pelvis. Reproductive: Top normal size prostate with nonspecific coarse internal prostatic calcifications. Other: No pneumoperitoneum, ascites or focal fluid collection. Musculoskeletal: No aggressive appearing focal osseous lesions. Comminuted moderate burst fracture of L1 vertebral body with approximately 50% loss of vertebral body height. Bony retropulsion up to 5 mm. No additional fractures. Marked lumbar spondylosis, most prominent at L3-4. IMPRESSION: 1. Moderate comminuted burst fracture of L1 vertebral body with approximately 50% loss of vertebral body height, probably acute. Bony retropulsion up to 5 mm. No additional fractures. 2. Indeterminate complex 5.7 cm thick-walled exophytic cystic mass in the posterior interpolar left kidney surrounding fat stranding, cannot exclude renal cell carcinoma, with the differential including traumatic hemorrhage within a benign renal cyst. Recommend further characterization with renal mass protocol MRI (preferred) or CT abdomen without and with IV contrast, which may be performed at this time or on a short term outpatient basis as clinically warranted. 3. Large right staghorn calculus with mild right pelvicaliectasis. Scattered gas throughout the right renal collecting system and nondependent bladder, presumably due to recent instrumentation. Nonobstructing left nephrolithiasis. 4. Small loculated right pleural effusion with smooth mild right pleural thickening, unchanged since 10/14/2019 chest CT. 5. Left main and 3 vessel coronary atherosclerosis. 6. Small hiatal hernia. 7. Small left inguinal hernia contains a portion of a small bowel loop. No evidence of acute bowel complication. 8. Aortic Atherosclerosis (ICD10-I70.0) and  Emphysema (ICD10-J43.9). Electronically Signed: By: Delbert Phenix M.D. On: 01/24/2021 18:23   MR LUMBAR SPINE WO CONTRAST  Result Date: 01/24/2021 CLINICAL DATA:  Compression fracture of the lumbar spine EXAM: MRI LUMBAR SPINE WITHOUT CONTRAST TECHNIQUE: Multiplanar, multisequence MR imaging of  the lumbar spine was performed. No intravenous contrast was administered. COMPARISON:  None. FINDINGS: Segmentation:  Standard Alignment:  Normal Vertebrae: Burst fracture of L1 with small osteonecrotic cleft, likely subacute. Mild bone marrow edema. Approximately 50% central height loss with 4-5 mm of retropulsion. Modic type 3 endplate changes at L3-4. Conus medullaris and cauda equina: Conus extends to the L1 level. Conus and cauda equina appear normal. Paraspinal and other soft tissues: Please see report for dedicated CT of the abdomen and pelvis performed the same day Disc levels: T12-L1: Narrowing of the ventral thecal sac due to L1 retropulsion but no spinal canal stenosis. L1-L2: Normal disc space and facets. No spinal canal or neuroforaminal stenosis. L2-L3: Disc space narrowing without herniation. No spinal canal or neural foraminal stenosis. L3-L4: Right asymmetric disc bulge with superimposed extraforaminal protrusion. Mild right foraminal narrowing with possible contact of the exiting right L3 nerve root. No spinal canal or left neural foraminal stenosis. L4-L5: Right asymmetric disc bulge. Mild narrowing of the right lateral recess. No central spinal canal or neural foraminal stenosis. L5-S1: Normal disc space and facets. No spinal canal or neuroforaminal stenosis. Visualized sacrum: Normal. IMPRESSION: 1. Burst fracture of L1 with 50% central height loss and 4-5 mm of retropulsion. Mild bone marrow edema. This is likely subacute. 2. Mild right L3-4 neural foraminal narrowing due to right asymmetric disc bulge with superimposed extraforaminal protrusion. Correlate for right L3 radiculopathy. 3. Mild narrowing  of the right lateral recess at L4-L5, which may serve as a source of right L5 radiculopathy. Electronically Signed   By: Deatra Robinson M.D.   On: 01/24/2021 23:41   MR ABDOMEN W WO CONTRAST  Result Date: 01/26/2021 CLINICAL DATA:  Inpatient. Status post percutaneous drainage of posterior perinephric left renal abscess on 01/25/2021 yielding purulent fluid. Percutaneous right nephrostomy tube placement for staghorn calculus. EXAM: MRI ABDOMEN WITHOUT AND WITH CONTRAST TECHNIQUE: Multiplanar multisequence MR imaging of the abdomen was performed both before and after the administration of intravenous contrast. CONTRAST:  8.2mL GADAVIST GADOBUTROL 1 MMOL/ML IV SOLN COMPARISON:  01/24/2021 CT chest, abdomen and pelvis. FINDINGS: Lower chest: Small to moderate right pleural effusion. Trace dependent left pleural effusion. Hepatobiliary: Normal liver size and configuration. No hepatic steatosis. Simple 2.4 cm left liver dome cyst. No suspicious liver masses. Normal gallbladder with no cholelithiasis. No biliary ductal dilatation. Common bile duct diameter 5 mm. No choledocholithiasis. No biliary masses, strictures or beading. Pancreas: No pancreatic mass or duct dilation.  No pancreas divisum. Spleen: Normal size. No mass. Adrenals/Urinary Tract: Normal adrenals. Mild right hydronephrosis with staghorn calculus filling the right renal pelvis and lower right renal collecting system, better seen on recent CT. Percutaneous nephrostomy tube appears to terminate in central right renal collecting system. Tiny mildly T1 hyperintense 0.9 cm renal cortical lesion in the interpolar right kidney (series 13/image 61) with questionable hypoenhancement on the subtraction sequences. Several simple bilateral renal cysts, largest 3.1 cm in lower left kidney. Complex exophytic 4.8 x 3.2 x 5.0 cm cystic mass in posterior upper left kidney (series 5/image 28) with percutaneous drain terminating centrally within the cystic portion of the  mass, with thick irregular enhancing wall and surrounding fat stranding, mildly decreased from 5.7 x 4.4 x 6.0 cm on 01/24/2021 CT. Stomach/Bowel: Normal non-distended stomach. Visualized small and large bowel is normal caliber, with no bowel wall thickening. Vascular/Lymphatic: Atherosclerotic abdominal aorta with 3.4 cm infrarenal abdominal aortic aneurysm. Patent portal, splenic, hepatic and renal veins. No pathologically enlarged lymph nodes in  the abdomen. Other: No abdominal ascites or focal fluid collection. Musculoskeletal: No aggressive appearing focal osseous lesions. Moderate L1 burst fracture as detailed on lumbar spine MRI from 1 day prior. IMPRESSION: 1. Complex exophytic 4.8 x 3.2 x 5.0 cm cystic mass in the posterior upper left kidney with thick irregular enhancing wall and surrounding fat stranding, mildly decreased from 5.7 x 4.4 x 6.0 cm on 01/24/2021 CT status post interval percutaneous drainage with drain tip located centrally within the mass. Given the findings of purulent fluid on percutaneous drainage, imaging findings are compatible with perinephric abscess. 2. Indeterminate tiny 0.9 cm interpolar right renal cortical lesion with questionable low level enhancement, cannot exclude renal neoplasm. Suggest attention to the bilateral renal findings on follow-up MRI abdomen without and with IV contrast in 3-6 months. 3. Mild right hydronephrosis with staghorn calculus filling the right renal pelvis and lower right renal collecting system, better seen on recent CT. Right percutaneous nephrostomy tube in place. 4. Small to moderate right and trace dependent left pleural effusions. 5. Moderate L1 burst fracture as detailed on lumbar spine MRI from 1 day prior. Electronically Signed   By: Delbert Phenix M.D.   On: 01/26/2021 08:14   IR Guided Horace Porteous W Catheter Placement  Result Date: 01/25/2021 INDICATION: Significant right-sided staghorn calculus with findings worrisome for emphysematous  pyelonephritis. Please perform image guided placement of right-sided nephrostomy catheter for infection source control purposes. Additionally, the patient was found to have an indeterminate left-sided perinephric collection. Please perform ultrasound-guided biopsy and/or drainage catheter placement for diagnostic and potentially therapeutic purposes. EXAM: 1. ULTRASOUND-GUIDED LEFT PERINEPHRIC ABSCESS DRAINAGE CATHETER PLACEMENT 2. ULTRASOUND AND FLUOROSCOPIC GUIDED PLACEMENT OF RIGHT NEPHROSTOMY TUBE COMPARISON:  CT chest, abdomen and pelvis-01/24/2021; lumbar spine MRI-01/24/2021 MEDICATIONS: Ancef 2 gm IV; The antibiotic was administered in an appropriate time frame prior to skin puncture. ANESTHESIA/SEDATION: Moderate (conscious) sedation was employed during this procedure. A total of Versed 2 mg and Fentanyl 100 mcg was administered intravenously. Moderate Sedation Time: 30 minutes. The patient's level of consciousness and vital signs were monitored continuously by radiology nursing throughout the procedure under my direct supervision. CONTRAST:  58mL Isovue 300 - administered into the right renal collecting system FLUOROSCOPY TIME:  3 minutes, 36 seconds (54 mGy) COMPLICATIONS: None immediate. PROCEDURE: The procedure, risks, benefits, and alternatives were explained to the patient, questions were encouraged and answered and informed consent was obtained. A timeout was performed prior to the initiation of the procedure. The operative sites were prepped and draped in the usual sterile fashion and a sterile drape was applied covering the operative field. A sterile gown and sterile gloves were used for the procedure. Local anesthesia was provided with 1% Lidocaine with epinephrine. Preprocedural spot fluoroscopic image demonstrates opacities overlying expected location of the bilateral renal fossa. Sonographic evaluation was performed of the left kidney demonstrating an approximately 6.3 x 4.3 cm complex apparent  collection arising from the superior pole of the left kidney (image 2) correlating with the indeterminate collection/mass seen on preceding abdominal CT image 80, series 3) multiple ultrasound images were saved procedural documentation purposes. Under direct ultrasound guidance, the indeterminate complex collection was accessed with a 17 gauge trocar needle. At this point, a small amount of purulent fluid was aspirated confirming the collection likely represents a perinephric abscess. As such, a short Amplatz wire was coiled within the collection. The track was dilated ultimately allowing placement of a 10 French percutaneous drainage catheter with end coiled and locked within the complex collection.  At this point, approximately 50 cc of purulent fluid was aspirated. A representative sample of aspirated fluid was capped and sent to the laboratory for analysis Drainage catheter was flushed with a small amount of saline and connected to a JP bulb. The drainage catheter was secured in place within interrupted suture and a Stat Lock device. _________________________________________________________ Attention was now paid towards placement of the right-sided percutaneous nephrostomy catheter. Sonographic evaluation was performed of the right kidney demonstrating moderate dilatation involving the superior pole of the right kidney with echogenic shadowing stones seen at the level of the inferior pole at the level of the renal pelvis. Under direct ultrasound guidance, the dilated non stone containing calyx within the posterosuperior aspect of the left kidney was targeted with a 20 gauge needle was advanced into the renal collecting system. An ultrasound image documentation was performed. Access within the collecting system was confirmed with the efflux of urine followed by limited contrast injection. Under intermittent fluoroscopic guidance, an 0.018 wire was advanced into the collecting system and the tract was dilated with  an Accustick stent. Next, over a short Amplatz wire, the track was further dilated ultimately allowing placement of a 10-French percutaneous nephrostomy catheter with end coiled and locked within the renal pelvis. Contrast was injected and several spot fluoroscopic images were obtained in various obliquities. The catheter was secured at the skin entrance site with an interrupted suture and a stat lock device and connected to a gravity bag. Dressings were applied. The patient tolerated both procedures well without immediate postprocedural complication. FINDINGS: Fluoroscopic demonstrates stones overlying expected location of the bilateral kidneys with dominant opacity overlying expected location of the right renal pelvis measuring at least 3.2 x 2.8 cm. Additional calculi overlie the expected location of the mid and inferior poles of the right kidney compatible with findings on preceding abdominal CT. Dominant left-sided opacity overlying the left renal fossa measures approximately 3.2 x 2.8 cm. Sonographic evaluation of the left kidney demonstrates an approximately 6.3 x 4.3 complex collection superior to the left kidney which was targeted with a 17 gauge needle yielding the return of purulent material. As such, a 10 French percutaneous drainage catheter was placed in this presumed perinephric abscess. Ultrasound scanning demonstrates moderate dilatation involving the superior pole of the right kidney with echogenic shadowing stones within the mid and inferior pole calices as was demonstrated on preceding abdominal CT. Under a combination of ultrasound and fluoroscopic guidance, a posterior superior calix was targeted allowing placement of a 10-French percutaneous nephrostomy catheter with end coiled and locked within the renal pelvis. Contrast injection confirmed appropriate positioning. IMPRESSION: 1. Successful ultrasound-guided placement of left-sided perinephric abscess drainage catheter yielding 50 cc of  purulent fluid. 2. Successful ultrasound and fluoroscopic guided placement of a right sided 10 [place] PCN. This nephrostomy catheter may be utilized for future PCNL usage as indicated. Electronically Signed   By: Simonne Come M.D.   On: 01/25/2021 13:35   IR NEPHROSTOMY PLACEMENT RIGHT  Result Date: 01/25/2021 INDICATION: Significant right-sided staghorn calculus with findings worrisome for emphysematous pyelonephritis. Please perform image guided placement of right-sided nephrostomy catheter for infection source control purposes. Additionally, the patient was found to have an indeterminate left-sided perinephric collection. Please perform ultrasound-guided biopsy and/or drainage catheter placement for diagnostic and potentially therapeutic purposes. EXAM: 1. ULTRASOUND-GUIDED LEFT PERINEPHRIC ABSCESS DRAINAGE CATHETER PLACEMENT 2. ULTRASOUND AND FLUOROSCOPIC GUIDED PLACEMENT OF RIGHT NEPHROSTOMY TUBE COMPARISON:  CT chest, abdomen and pelvis-01/24/2021; lumbar spine MRI-01/24/2021 MEDICATIONS: Ancef 2 gm IV; The  antibiotic was administered in an appropriate time frame prior to skin puncture. ANESTHESIA/SEDATION: Moderate (conscious) sedation was employed during this procedure. A total of Versed 2 mg and Fentanyl 100 mcg was administered intravenously. Moderate Sedation Time: 30 minutes. The patient's level of consciousness and vital signs were monitored continuously by radiology nursing throughout the procedure under my direct supervision. CONTRAST:  15mL Isovue 300 - administered into the right renal collecting system FLUOROSCOPY TIME:  3 minutes, 36 seconds (54 mGy) COMPLICATIONS: None immediate. PROCEDURE: The procedure, risks, benefits, and alternatives were explained to the patient, questions were encouraged and answered and informed consent was obtained. A timeout was performed prior to the initiation of the procedure. The operative sites were prepped and draped in the usual sterile fashion and a sterile  drape was applied covering the operative field. A sterile gown and sterile gloves were used for the procedure. Local anesthesia was provided with 1% Lidocaine with epinephrine. Preprocedural spot fluoroscopic image demonstrates opacities overlying expected location of the bilateral renal fossa. Sonographic evaluation was performed of the left kidney demonstrating an approximately 6.3 x 4.3 cm complex apparent collection arising from the superior pole of the left kidney (image 2) correlating with the indeterminate collection/mass seen on preceding abdominal CT image 80, series 3) multiple ultrasound images were saved procedural documentation purposes. Under direct ultrasound guidance, the indeterminate complex collection was accessed with a 17 gauge trocar needle. At this point, a small amount of purulent fluid was aspirated confirming the collection likely represents a perinephric abscess. As such, a short Amplatz wire was coiled within the collection. The track was dilated ultimately allowing placement of a 10 French percutaneous drainage catheter with end coiled and locked within the complex collection. At this point, approximately 50 cc of purulent fluid was aspirated. A representative sample of aspirated fluid was capped and sent to the laboratory for analysis Drainage catheter was flushed with a small amount of saline and connected to a JP bulb. The drainage catheter was secured in place within interrupted suture and a Stat Lock device. _________________________________________________________ Attention was now paid towards placement of the right-sided percutaneous nephrostomy catheter. Sonographic evaluation was performed of the right kidney demonstrating moderate dilatation involving the superior pole of the right kidney with echogenic shadowing stones seen at the level of the inferior pole at the level of the renal pelvis. Under direct ultrasound guidance, the dilated non stone containing calyx within the  posterosuperior aspect of the left kidney was targeted with a 20 gauge needle was advanced into the renal collecting system. An ultrasound image documentation was performed. Access within the collecting system was confirmed with the efflux of urine followed by limited contrast injection. Under intermittent fluoroscopic guidance, an 0.018 wire was advanced into the collecting system and the tract was dilated with an Accustick stent. Next, over a short Amplatz wire, the track was further dilated ultimately allowing placement of a 10-French percutaneous nephrostomy catheter with end coiled and locked within the renal pelvis. Contrast was injected and several spot fluoroscopic images were obtained in various obliquities. The catheter was secured at the skin entrance site with an interrupted suture and a stat lock device and connected to a gravity bag. Dressings were applied. The patient tolerated both procedures well without immediate postprocedural complication. FINDINGS: Fluoroscopic demonstrates stones overlying expected location of the bilateral kidneys with dominant opacity overlying expected location of the right renal pelvis measuring at least 3.2 x 2.8 cm. Additional calculi overlie the expected location of the mid and inferior  poles of the right kidney compatible with findings on preceding abdominal CT. Dominant left-sided opacity overlying the left renal fossa measures approximately 3.2 x 2.8 cm. Sonographic evaluation of the left kidney demonstrates an approximately 6.3 x 4.3 complex collection superior to the left kidney which was targeted with a 17 gauge needle yielding the return of purulent material. As such, a 10 French percutaneous drainage catheter was placed in this presumed perinephric abscess. Ultrasound scanning demonstrates moderate dilatation involving the superior pole of the right kidney with echogenic shadowing stones within the mid and inferior pole calices as was demonstrated on preceding  abdominal CT. Under a combination of ultrasound and fluoroscopic guidance, a posterior superior calix was targeted allowing placement of a 10-French percutaneous nephrostomy catheter with end coiled and locked within the renal pelvis. Contrast injection confirmed appropriate positioning. IMPRESSION: 1. Successful ultrasound-guided placement of left-sided perinephric abscess drainage catheter yielding 50 cc of purulent fluid. 2. Successful ultrasound and fluoroscopic guided placement of a right sided 10 [place] PCN. This nephrostomy catheter may be utilized for future PCNL usage as indicated. Electronically Signed   By: Simonne Come M.D.   On: 01/25/2021 13:35    Labs:  CBC: Recent Labs    11/30/20 1202 01/24/21 1323 01/25/21 0222 01/26/21 0250  WBC 8.2 9.7 9.5 9.6  HGB 10.1* 9.8* 8.5* 8.1*  HCT 30.6* 31.3* 27.8* 25.7*  PLT 346 395 327 261    COAGS: Recent Labs    01/25/21 1013  INR 1.6*    BMP: Recent Labs    02/06/20 0850 02/07/20 0919 02/08/20 0640 02/09/20 0709 06/02/20 0853 08/29/20 1537 11/30/20 1202 01/24/21 1323 01/25/21 0222 01/26/21 0250  NA 140 139 139 139   < > 139 140 136 136 134*  K 3.7 3.6 3.3* 3.6   < > 3.0* 4.1 3.2* 3.2* 3.1*  CL 102 100 101 101   < > 103 100 101 103 103  CO2 28 29 29 29    < > 27 21 24 22  20*  GLUCOSE 129* 175* 114* 123*   < > 115* 101* 120* 116* 112*  BUN 14 13 16 18    < > 10 11 24* 24* 20  CALCIUM 8.8* 9.1 8.9 8.9   < > 8.4* 8.9 8.9 8.7* 8.4*  CREATININE 1.18 1.27* 1.22 1.22   < > 1.20 1.28* 2.11* 1.88* 1.63*  GFRNONAA 57* 52* 55* 55*   < > >60  --  30* 35* 42*  GFRAA >60 >60 >60 >60  --   --   --   --   --   --    < > = values in this interval not displayed.    LIVER FUNCTION TESTS: Recent Labs    02/06/20 1308 06/03/20 0331 01/24/21 2012  BILITOT 1.1 1.5* 0.6  AST 19 14* 14*  ALT 18 12 13   ALKPHOS 76 54 63  PROT 6.1* 5.2* 6.3*  ALBUMIN 3.4* 2.6* 2.8*    Assessment and Plan:  85 y.o. male with UTI and incidental  finding of right staghorn calculus with scattered gas in the RIGHT renal collecting system and LEFT cystic renal mass. IR was originally requested for RIGHT PCN placement only by urology.  The CT was reviewed by Dr. 08/03/20 who recommended aspiration and possible drain placement for the LEFT renal cystic mass as the imaging characteristic was suspicious for renal abscess.  S/p RIGHT PCN and LEFT renal abscess drain placement with Dr. 01/26/21 on 6/1.   Drains intact, sites unremarkable.  OP right PCN 150 cc/ left renal abscess drain 90 cc  Cx pending  VS hypotensive and tachycardic this morning but patient not in acute distress.   CBC stable  WBC 9.6 (9.5 yesterday)/ Hgb 8.1 (8.5)  RF improving BUN 20 (24)/ Cr 1.63 (1.88)/ GFR 42(35)   Continue with flushing TID, output recording q shift and dressing changes as needed. Would consider additional imaging when output is less than 10 ml for 24 hours not including flush material.     Further treatment plan per IMTS/ Urology Appreciate and agree with the plan.  IR to follow.    Electronically Signed: Willette Brace, PA-C 01/26/2021, 9:37 AM   I spent a total of 25 Minutes at the the patient's bedside AND on the patient's hospital floor or unit, greater than 50% of which was counseling/coordinating care for RIGHT PCN and LEFT renal abscess drain

## 2021-01-26 NOTE — TOC Initial Note (Signed)
Transition of Care [facility]) - Initial/Assessment Note    Patient Details  Name: Alfred Mercado MRN: [id number] Date of Birth: 1935/08/31  Transition of [facility]) CM/SW Contact:    Lockie Pares, RN Phone Number: 01/26/2021, 1:01 PM  Clinical Narrative:                 85 YO patient admitted with abbess. IR placed drain today. Patient lives in [facility], no listed contacts. . Does have a PCP and Medicare. Will likely need HH, Has both Medicare and Medicaid. Will see what PT recommends CM to follow.         Patient Goals and CMS Choice        Expected Discharge Plan and Services         Living arrangements for the past 2 months: No permanent address,Hotel/Motel (Lives [facility])                                      Prior Living Arrangements/Services Living arrangements for the past 2 months: No permanent address,Hotel/Motel (Lives at [facility]) Lives with:: Self Patient language and need for interpreter reviewed:: Yes        Need for Family Participation in Patient Care: Yes (Comment) Care giver support system in place?: No (comment)   Criminal Activity/Legal Involvement Pertinent to Current Situation/Hospitalization: No - Comment as needed  Activities of Daily Living Home Assistive Devices/Equipment: None ADL Screening (condition at time of admission) Patient's cognitive ability adequate to safely complete daily activities?: Yes Is the patient deaf or have difficulty hearing?: Yes Does the patient have difficulty seeing, even when wearing glasses/contacts?: No Does the patient have difficulty concentrating, remembering, or making decisions?: No Patient able to express need for assistance with ADLs?: Yes Does the patient have difficulty dressing or bathing?: No Independently performs ADLs?: Yes (appropriate for developmental age) Does the patient have difficulty walking or climbing stairs?: Yes Weakness of Legs:  Both Weakness of Arms/Hands: None  Permission Sought/Granted                  Emotional Assessment       Orientation: : Oriented to Self,Oriented to Place,Oriented to  Time,Oriented to Situation   Psych Involvement: No (comment)  Admission diagnosis:  Renal mass, left [N28.89] Generalized weakness [R53.1] AKI (acute kidney injury) (HCC) [N17.9] Acute kidney injury (HCC) [N17.9] Closed burst fracture of lumbar vertebra, initial encounter (HCC) [S32.001A] Urinary tract infection with hematuria, site unspecified [N39.0, R31.9] Abdominal aortic aneurysm (AAA) without rupture [facility]) [I71.4] Patient Active Problem List   Diagnosis Date Noted  . Severe sepsis (HCC) 01/25/2021  . Iron deficiency anemia 01/25/2021  . Generalized weakness   . Acute-on-chronic kidney injury (HCC) 01/24/2021  . Complicated urinary tract infection 01/24/2021  . Left renal mass 01/24/2021  . Staghorn renal calculus 01/24/2021  . Microscopic hematuria 01/24/2021  . Aortic aneurysm (HCC) 01/24/2021  . Closed burst fracture of lumbar vertebra (HCC) 01/24/2021  . Acute respiratory failure with hypoxia (HCC) 08/13/2020  . UTI (urinary tract infection) 06/02/2020  . Community acquired pneumonia of right lower lobe of lung 06/02/2020  . Acute hypoxemic respiratory failure (HCC) 06/02/2020  . Hypokalemia 06/02/2020  . Acute diastolic CHF (congestive heart failure) (HCC) 02/06/2020  . Exertional shortness of breath 10/14/2019  . Atrial fibrillation with RVR (HCC) 10/14/2019  . Pleural effusion   . Chronic heart  failure with preserved ejection fraction (HCC)    PCP:  Hoy Register, MD Pharmacy:   [facility] [phone] - [place], [place] - [phone] AT [facility] [address] & [address] [place] [address] Phone: [phone] Fax: [phone]     Social Determinants of Health (SDOH) Interventions    Readmission Risk Interventions No flowsheet data  found.

## 2021-01-27 LAB — CBC
HCT: 24 % — ABNORMAL LOW (ref 39.0–52.0)
Hemoglobin: 7.8 g/dL — ABNORMAL LOW (ref 13.0–17.0)
MCH: 25.2 pg — ABNORMAL LOW (ref 26.0–34.0)
MCHC: 32.5 g/dL (ref 30.0–36.0)
MCV: 77.4 fL — ABNORMAL LOW (ref 80.0–100.0)
Platelets: 259 10*3/uL (ref 150–400)
RBC: 3.1 MIL/uL — ABNORMAL LOW (ref 4.22–5.81)
RDW: 14.8 % (ref 11.5–15.5)
WBC: 8.8 10*3/uL (ref 4.0–10.5)
nRBC: 0 % (ref 0.0–0.2)

## 2021-01-27 LAB — BASIC METABOLIC PANEL
Anion gap: 9 (ref 5–15)
BUN: 20 mg/dL (ref 8–23)
CO2: 21 mmol/L — ABNORMAL LOW (ref 22–32)
Calcium: 8.7 mg/dL — ABNORMAL LOW (ref 8.9–10.3)
Chloride: 105 mmol/L (ref 98–111)
Creatinine, Ser: 1.63 mg/dL — ABNORMAL HIGH (ref 0.61–1.24)
GFR, Estimated: 42 mL/min — ABNORMAL LOW (ref >60)
Glucose, Bld: 109 mg/dL — ABNORMAL HIGH (ref 70–99)
Potassium: 3.2 mmol/L — ABNORMAL LOW (ref 3.5–5.1)
Sodium: 135 mmol/L (ref 135–145)

## 2021-01-27 MED ORDER — POTASSIUM CHLORIDE CRYS ER 20 MEQ PO TBCR
40.0000 meq | EXTENDED_RELEASE_TABLET | Freq: Once | ORAL | Status: AC
  Administered 2021-01-27: 40 meq via ORAL
  Filled 2021-01-27: qty 2

## 2021-01-27 MED ORDER — SODIUM CHLORIDE 0.9 % IV SOLN
510.0000 mg | Freq: Once | INTRAVENOUS | Status: AC
  Administered 2021-01-27: 510 mg via INTRAVENOUS
  Filled 2021-01-27: qty 17

## 2021-01-27 MED ORDER — LACTATED RINGERS IV SOLN: INTRAVENOUS | Status: AC

## 2021-01-27 MED ORDER — POLYETHYLENE GLYCOL 3350 17 G PO PACK
17.0000 g | PACK | Freq: Every day | ORAL | Status: DC | PRN
  Administered 2021-01-28 – 2021-01-30 (×2): 17 g via ORAL
  Filled 2021-01-27 (×2): qty 1

## 2021-01-27 MED ORDER — BISACODYL 5 MG PO TBEC
20.0000 mg | DELAYED_RELEASE_TABLET | Freq: Every day | ORAL | Status: DC | PRN
  Administered 2021-01-28 – 2021-01-30 (×2): 20 mg via ORAL
  Filled 2021-01-27 (×3): qty 4

## 2021-01-27 NOTE — Progress Notes (Signed)
Subjective: No acute events overnight.  Alfred Mercado is doing better this morning. He states he slept mostly well. His eye pain is better with no blurry vision or difficulty with EOM. He continues to report some mild abdominal and back pain. He has no new complaints. He does not have much of an appetite and is not interested in drinking water.   Objective:  Vital signs in last 24 hours: Vitals:   01/26/21 1754 01/26/21 2107 01/27/21 0415 01/27/21 0929  BP: 103/64 116/71 (!) 113/42 128/73  Pulse: 74 (!) 103 (!) 104 88  Resp: 17 20 15 19   Temp: 98.4 F (36.9 C) 98.4 F (36.9 C) 97.7 F (36.5 C) 98 F (36.7 C)  TempSrc:  Oral Oral Oral  SpO2: 98% 93% 97% 99%  Weight:      Height:       Weight change:   Intake/Output Summary (Last 24 hours) at 01/27/2021 1221 Last data filed at 01/27/2021 0900 Gross per 24 hour  Intake 881.59 ml  Output 2110 ml  Net -1228.41 ml   Physical Exam Constitutional:      General: He is not in acute distress.    Appearance: He is not toxic-appearing.     Comments: Elderly man resting in bed, semi-comfortable, conversational  HENT:     Mouth/Throat:     Mouth: Mucous membranes are dry.  Eyes:     General:        Right eye: No discharge.        Left eye: No discharge.     Extraocular Movements: Extraocular movements intact.     Conjunctiva/sclera: Conjunctivae normal.     Pupils: Pupils are equal, round, and reactive to light.  Cardiovascular:     Rate and Rhythm: Normal rate and regular rhythm.     Heart sounds: No murmur heard.   Pulmonary:     Effort: Pulmonary effort is normal.     Breath sounds: Normal breath sounds.     Comments: Anterior lung fields normal Abdominal:     General: Abdomen is flat.     Palpations: Abdomen is soft.     Comments: Tenderness to palpation  Genitourinary:    Comments: JP bulb on left with red fluid and some pus material  Drain on right with dark urine and sediment Skin:    General: Skin is warm and  dry.    Assessment/Plan:  Principal Problem:   Complicated urinary tract infection Active Problems:   Hypokalemia   Acute-on-chronic kidney injury (HCC)   Left renal mass   Staghorn renal calculus   Microscopic hematuria   Aortic aneurysm (HCC)   Closed burst fracture of lumbar vertebra (HCC)   Severe sepsis (HCC)   Iron deficiency anemia   Generalized weakness  This is hospital day #4 forMr. Alfred Mercado,an86 year old man with history of HFpEF (06/2019 EF 50-55%) with associated right pleural effusion s/p thoracentesis, atrial fibrillation on Eliquis, heavy tobacco use, COPD,andvitamin D deficiencywho presented withgeneralized weakness and recent falls.  #Complicated UTI withleftpyelonephritis abscess Urine culture demonstrated multiple organisms and reincubated with no results thus far. BC with NGTD. BP overall improving with most recent reading of 128/73. He remains afebrile with a normal WBC and mild intermittent tachycardia that has improved since restarting metoprolol (most recent HR of 88). JP bulb in place that continues to drain a mix of red/pus material.  - Urology following - Cont JP bulb drainage   - Wound culture: WBC with no organisms, reincubated culture for  better growth  - Cont Cefepime dosing per pharmacy, day 4/? - Urology recommends 2-3w of antibiotic therapy tailored to culture results  #Right staghorn renal calculus with emphysematous features #Microscopic hematuria  No changes since yesterday. Urology and IR on board. Perc neph tube draining dark urine with solid sediment.  - Urology following -Cont foley for bladder decompression - Cont percutaneous nephrostomy tube - Cont Flomax 0.4mg  daily   #Eye pain, improved He reports his eye pain is improved. No drainage, blurry vision, or difficulty with EOM. - Cont artificial tears and warm compresses PRN  #AKI on CKD IIIa, improving Initially presented with an AKI on top of known CKD IIIa  d/t likely pre-renal and obstructive etiology given dry/volume down on exam with known staghorn calculus and mass. - Cr 2.11 >> 1.63, BUN 24 >> 20, GFR 30 >> 42 - Starting LR at 211mL/hr   - BMP tomorrow  #Hypokalemia, worsening - K 3.2 >> 3.2 - replacement today  - BMP tomorrow  #Iron deficiency anemia, worsening He has a history of chronic anemia with a baseline of 10-11. Iron studies suggestive of iron deficiency anemia with low iron, low saturation, and low-normal ferritin. Further supported by low MCV.  - Hgb 9.8 > 8.5 > 8.1 > 7.8 - Iron replacement today - CBC tomorrow   #HFpEF, stable #Right pleural effusion, stable Follows with Dr. Elease Hashimoto. History of pleural effusion on right side likely related to HFpEF history that was noted on CT chest during this admission.  - Consider restarting Diltiazem and Lasix as his BP continues to improve  - Cont metoprolol 25mg  BID   #Atrial fibrillation, stable Follows with Dr. . On Eliquis. CHADS-VASC of 7. - Cont Eliquis 2.5mg  BID (down from home dose due to renal function)   - HR improving now that home metoprolol has been restarted  #COPD, stable #Tobacco use disorder  No wheezing on examination. Now smoking 3-4 cigs/day. - Cont Spiriva and albuterol  - Cont nicotine patch   #Interpolar R renal cortical lesion Indeterminate tiny 0.9cm lesion on MRI performed for above reasons. - Radiology recommends f/u MRI with and without contrast in 3-6 months for concern that this lesion could be a neoplasm   #Infrarenal aortic aneurysm 3.3 cm aneurysm noted on CT chest. - Recommend U/S follow-up in 3 years  #L1 burst fracture with retropulsion, stable Several falls prior to admission. CT and MRI completed. - Neurosurgery consulted: no acute interventions planned, f/u X-rays in 1 month  - PT/OT consulted  - Brace placed   #Extra-axial frontotemporal lesion Head CT demonstrated old subdural hematoma vs subdural  hygroma. - No headaches - Continue to monitor   LOS: 3 days   Elease Hashimoto, Medical Student 01/27/2021, 11:49 AM

## 2021-01-27 NOTE — Progress Notes (Signed)
Referring Physician(s): Noel Christmas (urology)  Supervising Physician: Oley Balm  Patient Status:  [facility] - In-pt  Chief Complaint:  History of right-sided calculus with probable emphysematous pyelonephritis s/p right PCN placement in IR 01/25/2021. Left perinephric fluid collection s/p left perinephric drain placement in IR 01/25/2021.  Subjective:  Patient laying in bed resting comfortably. He responds to voice but is HOH. No complaints. Right PCN and left perinephric drain sites c/d/i.   Allergies: Patient has no known allergies.  Medications: Prior to Admission medications   Medication Sig Start Date End Date Taking? Authorizing Provider  acetaminophen (TYLENOL) 325 MG tablet Take 650 mg by mouth every 6 (six) hours as needed for mild pain or headache.   Yes [provider]  albuterol (VENTOLIN HFA) 108 (90 Base) MCG/ACT inhaler Inhale 2 puffs into the lungs every 6 (six) hours as needed for wheezing or shortness of breath. 11/30/20  Yes Hoy Register, MD  apixaban (ELIQUIS) 5 MG TABS tablet Take 1 tablet (5 mg total) by mouth 2 (two) times daily. 11/30/20  Yes Hoy Register, MD  bisacodyl (DULCOLAX) 5 MG EC tablet Take 20 mg by mouth daily as needed for moderate constipation.   Yes [provider]  diltiazem (CARDIZEM SR) 90 MG 12 hr capsule Take 1 capsule (90 mg total) by mouth 2 (two) times daily. 11/30/20  Yes Hoy Register, MD  furosemide (LASIX) 40 MG tablet Take 1 tablet (40 mg total) by mouth See admin instructions. Patient taking differently: Take 40 mg by mouth daily. 11/30/20  Yes Hoy Register, MD  metoprolol tartrate (LOPRESSOR) 25 MG tablet TAKE 1 TABLET(25 MG) BY MOUTH TWICE DAILY Patient taking differently: Take 25 mg by mouth 2 (two) times daily. 11/30/20  Yes Hoy Register, MD  Misc. Devices MISC Power wheelchair.  Diagnosis atrial fibrillation, gait abnormality 12/01/20  Yes Newlin, Odette Horns, MD  Tiotropium Bromide Monohydrate (SPIRIVA  RESPIMAT) 2.5 MCG/ACT AERS INHALE 2 PUFFS BY MOUTH DAILY Patient taking differently: Inhale 2 puffs into the lungs daily. INHALE 2 PUFFS BY MOUTH DAILY 11/30/20  Yes Newlin, Odette Horns, MD  Vitamin D, Ergocalciferol, (DRISDOL) 1.25 MG (50000 UNIT) CAPS capsule TAKE 1 CAPSULE BY MOUTH 1 TIME A WEEK Patient taking differently: Take 50,000 Units by mouth every 7 (seven) days. 12/01/20  Yes Hoy Register, MD  carbamide peroxide (DEBROX) 6.5 % OTIC solution Place 5 drops into the right ear 2 (two) times daily. Patient not taking: Reported on 01/24/2021 11/30/20   Hoy Register, MD  nicotine (NICODERM CQ - DOSED IN MG/24 HOURS) 21 mg/24hr patch Place 1 patch (21 mg total) onto the skin daily. Patient not taking: Reported on 01/24/2021 06/07/20   Hoy Register, MD  nicotine (NICODERM CQ - DOSED IN MG/24 HOURS) 21 mg/24hr patch PLACE 1 PATCH (21 MG TOTAL) ONTO THE SKIN DAILY. Patient not taking: Reported on 01/24/2021 06/06/20 06/06/21  Dolan Amen, MD  polyethylene glycol powder [facility]) 17 GM/SCOOP powder Take 17 g by mouth daily. Patient not taking: Reported on 01/24/2021 03/02/20   Hoy Register, MD     Vital Signs: BP 128/73 (BP Location: Left Arm)   Pulse 88   Temp 98 F (36.7 C) (Oral)   Resp 19   Ht 6\' 1"  (1.854 m)   Wt 180 lb 8.9 oz (81.9 kg)   SpO2 99%   BMI 23.82 kg/m   Physical Exam Vitals and nursing note reviewed.  Constitutional:      General: He is not in acute distress. Pulmonary:  Effort: Pulmonary effort is normal. No respiratory distress.  Genitourinary:    Comments: (+) urinary foley with hazy yellow urine in bag. Right PCN and left perinephric drain sites without tenderness, erythema, drainage, or active bleeding. Right PCN with approximately 25 cc dark gold urine in gravity bag. Left perinephric drain with approximately 15 cc dark red fluid in suction bulb. Skin:    General: Skin is warm and dry.  Neurological:     Mental Status: He is alert.      Imaging: CT ABDOMEN PELVIS WO CONTRAST  Addendum Date: 01/24/2021   ADDENDUM REPORT: 01/24/2021 20:28 ADDENDUM: Additional clinical history was provided of abnormal urinalysis concerning for acute urinary tract infection, and clinical presentation concerning for sepsis. In light of this additional clinical history, the differential for the bladder and right renal collecting system gas includes acute gas-producing cystitis and ascending right pyelitis. In addition, the differential for the complex thick-walled exophytic cystic posterior interpolar left renal mass includes a renal abscess. The recommendation for further characterization of the left renal mass with MRI (preferred) or CT abdomen without and with IV contrast is unchanged. These results were called by telephone at the time of interpretation on 01/24/2021 at 8:27 pm to provider DR. CHRISTIAN, who verbally acknowledged these results. Electronically Signed   By: Delbert Phenix M.D.   On: 01/24/2021 20:28   Addendum Date: 01/24/2021   ADDENDUM REPORT: 01/24/2021 18:56 ADDENDUM: 9. Infrarenal 3.3 cm abdominal aortic aneurysm. Recommend follow-up ultrasound every 3 years. This recommendation follows ACR consensus guidelines: White Paper of the ACR Incidental Findings Committee II on Vascular Findings. J Am Coll Radiol 2013; 10:789-794. Electronically Signed   By: Delbert Phenix M.D.   On: 01/24/2021 18:56   Result Date: 01/24/2021 CLINICAL DATA:  Abnormal chest radiograph. Recent fall with back pain. EXAM: CT CHEST, ABDOMEN AND PELVIS WITHOUT CONTRAST TECHNIQUE: Multidetector CT imaging of the chest, abdomen and pelvis was performed following the standard protocol without IV contrast. COMPARISON:  Chest radiograph from earlier today. 10/14/2019 chest CT. FINDINGS: CT CHEST FINDINGS Cardiovascular: Normal heart size. No significant pericardial effusion/thickening. Left main and 3 vessel coronary atherosclerosis. Atherosclerotic nonaneurysmal thoracic  aorta. Stable top-normal caliber main pulmonary artery (3.0 cm diameter). Mediastinum/Nodes: No discrete thyroid nodules. Unremarkable esophagus. No pathologically enlarged axillary, mediastinal or hilar lymph nodes, noting limited sensitivity for the detection of hilar adenopathy on this noncontrast study. Lungs/Pleura: No pneumothorax. Small loculated right pleural effusion with smooth mild right pleural thickening, unchanged since 10/14/2019 chest CT. No left pleural effusion. Moderate centrilobular emphysema with diffuse bronchial wall thickening. Thick irregular bandlike consolidation with associated volume loss and mild surrounding distortion and thin parenchymal banding throughout the periphery of the mid to lower right lung, abutting the loculated right pleural effusion, similar, compatible with pleural-parenchymal scarring. Patchy debris narrowing the lobar bronchus in the right lower lobe, similar. No acute consolidative airspace disease, lung masses or significant pulmonary nodules. Musculoskeletal: No aggressive appearing focal osseous lesions. Moderate to marked thoracic spondylosis. No fracture in chest. CT ABDOMEN PELVIS FINDINGS Hepatobiliary: Normal liver size. Simple 2.5 cm left liver dome cyst. No additional liver lesions. Normal gallbladder with no radiopaque cholelithiasis. No biliary ductal dilatation. Pancreas: Normal, with no mass or duct dilation. Spleen: Normal size. No mass. Adrenals/Urinary Tract: Normal adrenals. Staghorn calculus replacing the right renal pelvis and much of mid to lower right renal collecting system, measuring up to 40 x 23 mm in the right renal pelvis. Scattered gas throughout right renal collecting system. Mild  right pelvicaliectasis. Nonobstructing 17 mm upper left renal stone. Punctate nonobstructing lower left renal stone. No left hydronephrosis. Complex 5.7 x 4.4 cm exophytic cystic mass in the posterior interpolar left kidney (series 3/image 80) with  circumferential wall thickening and surrounding fat stranding. Simple exophytic 3.1 cm lower left renal cyst. Normal caliber ureters. No ureteral stones. Gas in the nondependent bladder. Otherwise normal bladder with no bladder stones. Stomach/Bowel: Small hiatal hernia. Otherwise normal nondistended stomach. Small left inguinal hernia contains a portion of a small bowel loop. No small bowel dilatation, wall thickening or pneumatosis. Appendix not discretely visualized. Normal large bowel with no diverticulosis, large bowel wall thickening or pericolonic fat stranding. Vascular/Lymphatic: Atherosclerotic abdominal aorta with 3.3 cm infrarenal abdominal aortic aneurysm. No pathologically enlarged lymph nodes in the abdomen or pelvis. Reproductive: Top normal size prostate with nonspecific coarse internal prostatic calcifications. Other: No pneumoperitoneum, ascites or focal fluid collection. Musculoskeletal: No aggressive appearing focal osseous lesions. Comminuted moderate burst fracture of L1 vertebral body with approximately 50% loss of vertebral body height. Bony retropulsion up to 5 mm. No additional fractures. Marked lumbar spondylosis, most prominent at L3-4. IMPRESSION: 1. Moderate comminuted burst fracture of L1 vertebral body with approximately 50% loss of vertebral body height, probably acute. Bony retropulsion up to 5 mm. No additional fractures. 2. Indeterminate complex 5.7 cm thick-walled exophytic cystic mass in the posterior interpolar left kidney surrounding fat stranding, cannot exclude renal cell carcinoma, with the differential including traumatic hemorrhage within a benign renal cyst. Recommend further characterization with renal mass protocol MRI (preferred) or CT abdomen without and with IV contrast, which may be performed at this time or on a short term outpatient basis as clinically warranted. 3. Large right staghorn calculus with mild right pelvicaliectasis. Scattered gas throughout the  right renal collecting system and nondependent bladder, presumably due to recent instrumentation. Nonobstructing left nephrolithiasis. 4. Small loculated right pleural effusion with smooth mild right pleural thickening, unchanged since 10/14/2019 chest CT. 5. Left main and 3 vessel coronary atherosclerosis. 6. Small hiatal hernia. 7. Small left inguinal hernia contains a portion of a small bowel loop. No evidence of acute bowel complication. 8. Aortic Atherosclerosis (ICD10-I70.0) and Emphysema (ICD10-J43.9). Electronically Signed: By: Delbert Phenix M.D. On: 01/24/2021 18:23   DG Chest 2 View  Result Date: 01/24/2021 CLINICAL DATA:  Weakness with several falls EXAM: CHEST - 2 VIEW COMPARISON:  August 29, 2020 FINDINGS: There is partially loculated pleural effusion on the right with airspace opacity in this region consistent with atelectasis and potential superimposed pneumonia. There is a nodular appearing opacity in the posteromedial aspect of the right lower lobe measuring 3.1 x 1.8 cm. Left lung is clear except for mild left base atelectasis. Heart is upper normal in size with pulmonary vascularity normal. No adenopathy. Probable bone island in the medial right humeral head. IMPRESSION: Partially loculated pleural effusion on the right with atelectasis and potential superimposed pneumonia right base. In the posteromedial right lower lobe region, there is a nodular appearing opacity measuring 3.1 x 1.8 cm. While this area could represent loculated fluid, a pulmonary mass in this area is of concern. Noncontrast chest CT to further evaluate this area is warranted. Left lung is clear except for mild left base atelectasis. Heart upper normal in size. Electronically Signed   By: Bretta Bang III M.D.   On: 01/24/2021 14:33   CT HEAD WO CONTRAST  Result Date: 01/24/2021 CLINICAL DATA:  Minor head trauma, multiple falls EXAM: CT HEAD WITHOUT CONTRAST  TECHNIQUE: Contiguous axial images were obtained from the  base of the skull through the vertex without intravenous contrast. Sagittal and coronal MPR images reconstructed from axial data set. COMPARISON:  None FINDINGS: Brain: Generalized atrophy. Normal ventricular morphology. No midline shift or mass effect. Small vessel chronic ischemic changes of deep cerebral white matter. Low-attenuation LEFT frontotemporal extra-axial collection question sequela of old subdural hematoma versus subdural hygroma. No acute intracranial hemorrhage, mass lesion, or evidence of acute infarction. Vascular: Atherosclerotic calcification of internal carotid and vertebral arteries at skull base. No hyperdense vessels. Skull: Intact Sinuses/Orbits: Clear Other: N/A IMPRESSION: Atrophy with small vessel chronic ischemic changes of deep cerebral white matter. Low-attenuation LEFT frontotemporal extra-axial collection question sequela of old subdural hematoma versus subdural hygroma. No acute intracranial abnormalities. Electronically Signed   By: Ulyses Southward M.D.   On: 01/24/2021 15:24   CT Chest Wo Contrast  Addendum Date: 01/24/2021   ADDENDUM REPORT: 01/24/2021 20:28 ADDENDUM: Additional clinical history was provided of abnormal urinalysis concerning for acute urinary tract infection, and clinical presentation concerning for sepsis. In light of this additional clinical history, the differential for the bladder and right renal collecting system gas includes acute gas-producing cystitis and ascending right pyelitis. In addition, the differential for the complex thick-walled exophytic cystic posterior interpolar left renal mass includes a renal abscess. The recommendation for further characterization of the left renal mass with MRI (preferred) or CT abdomen without and with IV contrast is unchanged. These results were called by telephone at the time of interpretation on 01/24/2021 at 8:27 pm to provider DR. CHRISTIAN, who verbally acknowledged these results. Electronically Signed   By: Delbert Phenix M.D.   On: 01/24/2021 20:28   Addendum Date: 01/24/2021   ADDENDUM REPORT: 01/24/2021 18:56 ADDENDUM: 9. Infrarenal 3.3 cm abdominal aortic aneurysm. Recommend follow-up ultrasound every 3 years. This recommendation follows ACR consensus guidelines: White Paper of the ACR Incidental Findings Committee II on Vascular Findings. J Am Coll Radiol 2013; 10:789-794. Electronically Signed   By: Delbert Phenix M.D.   On: 01/24/2021 18:56   Result Date: 01/24/2021 CLINICAL DATA:  Abnormal chest radiograph. Recent fall with back pain. EXAM: CT CHEST, ABDOMEN AND PELVIS WITHOUT CONTRAST TECHNIQUE: Multidetector CT imaging of the chest, abdomen and pelvis was performed following the standard protocol without IV contrast. COMPARISON:  Chest radiograph from earlier today. 10/14/2019 chest CT. FINDINGS: CT CHEST FINDINGS Cardiovascular: Normal heart size. No significant pericardial effusion/thickening. Left main and 3 vessel coronary atherosclerosis. Atherosclerotic nonaneurysmal thoracic aorta. Stable top-normal caliber main pulmonary artery (3.0 cm diameter). Mediastinum/Nodes: No discrete thyroid nodules. Unremarkable esophagus. No pathologically enlarged axillary, mediastinal or hilar lymph nodes, noting limited sensitivity for the detection of hilar adenopathy on this noncontrast study. Lungs/Pleura: No pneumothorax. Small loculated right pleural effusion with smooth mild right pleural thickening, unchanged since 10/14/2019 chest CT. No left pleural effusion. Moderate centrilobular emphysema with diffuse bronchial wall thickening. Thick irregular bandlike consolidation with associated volume loss and mild surrounding distortion and thin parenchymal banding throughout the periphery of the mid to lower right lung, abutting the loculated right pleural effusion, similar, compatible with pleural-parenchymal scarring. Patchy debris narrowing the lobar bronchus in the right lower lobe, similar. No acute consolidative  airspace disease, lung masses or significant pulmonary nodules. Musculoskeletal: No aggressive appearing focal osseous lesions. Moderate to marked thoracic spondylosis. No fracture in chest. CT ABDOMEN PELVIS FINDINGS Hepatobiliary: Normal liver size. Simple 2.5 cm left liver dome cyst. No additional liver lesions. Normal gallbladder with no radiopaque  cholelithiasis. No biliary ductal dilatation. Pancreas: Normal, with no mass or duct dilation. Spleen: Normal size. No mass. Adrenals/Urinary Tract: Normal adrenals. Staghorn calculus replacing the right renal pelvis and much of mid to lower right renal collecting system, measuring up to 40 x 23 mm in the right renal pelvis. Scattered gas throughout right renal collecting system. Mild right pelvicaliectasis. Nonobstructing 17 mm upper left renal stone. Punctate nonobstructing lower left renal stone. No left hydronephrosis. Complex 5.7 x 4.4 cm exophytic cystic mass in the posterior interpolar left kidney (series 3/image 80) with circumferential wall thickening and surrounding fat stranding. Simple exophytic 3.1 cm lower left renal cyst. Normal caliber ureters. No ureteral stones. Gas in the nondependent bladder. Otherwise normal bladder with no bladder stones. Stomach/Bowel: Small hiatal hernia. Otherwise normal nondistended stomach. Small left inguinal hernia contains a portion of a small bowel loop. No small bowel dilatation, wall thickening or pneumatosis. Appendix not discretely visualized. Normal large bowel with no diverticulosis, large bowel wall thickening or pericolonic fat stranding. Vascular/Lymphatic: Atherosclerotic abdominal aorta with 3.3 cm infrarenal abdominal aortic aneurysm. No pathologically enlarged lymph nodes in the abdomen or pelvis. Reproductive: Top normal size prostate with nonspecific coarse internal prostatic calcifications. Other: No pneumoperitoneum, ascites or focal fluid collection. Musculoskeletal: No aggressive appearing focal  osseous lesions. Comminuted moderate burst fracture of L1 vertebral body with approximately 50% loss of vertebral body height. Bony retropulsion up to 5 mm. No additional fractures. Marked lumbar spondylosis, most prominent at L3-4. IMPRESSION: 1. Moderate comminuted burst fracture of L1 vertebral body with approximately 50% loss of vertebral body height, probably acute. Bony retropulsion up to 5 mm. No additional fractures. 2. Indeterminate complex 5.7 cm thick-walled exophytic cystic mass in the posterior interpolar left kidney surrounding fat stranding, cannot exclude renal cell carcinoma, with the differential including traumatic hemorrhage within a benign renal cyst. Recommend further characterization with renal mass protocol MRI (preferred) or CT abdomen without and with IV contrast, which may be performed at this time or on a short term outpatient basis as clinically warranted. 3. Large right staghorn calculus with mild right pelvicaliectasis. Scattered gas throughout the right renal collecting system and nondependent bladder, presumably due to recent instrumentation. Nonobstructing left nephrolithiasis. 4. Small loculated right pleural effusion with smooth mild right pleural thickening, unchanged since 10/14/2019 chest CT. 5. Left main and 3 vessel coronary atherosclerosis. 6. Small hiatal hernia. 7. Small left inguinal hernia contains a portion of a small bowel loop. No evidence of acute bowel complication. 8. Aortic Atherosclerosis (ICD10-I70.0) and Emphysema (ICD10-J43.9). Electronically Signed: By: Delbert Phenix M.D. On: 01/24/2021 18:23   MR LUMBAR SPINE WO CONTRAST  Result Date: 01/24/2021 CLINICAL DATA:  Compression fracture of the lumbar spine EXAM: MRI LUMBAR SPINE WITHOUT CONTRAST TECHNIQUE: Multiplanar, multisequence MR imaging of the lumbar spine was performed. No intravenous contrast was administered. COMPARISON:  None. FINDINGS: Segmentation:  Standard Alignment:  Normal Vertebrae: Burst  fracture of L1 with small osteonecrotic cleft, likely subacute. Mild bone marrow edema. Approximately 50% central height loss with 4-5 mm of retropulsion. Modic type 3 endplate changes at L3-4. Conus medullaris and cauda equina: Conus extends to the L1 level. Conus and cauda equina appear normal. Paraspinal and other soft tissues: Please see report for dedicated CT of the abdomen and pelvis performed the same day Disc levels: T12-L1: Narrowing of the ventral thecal sac due to L1 retropulsion but no spinal canal stenosis. L1-L2: Normal disc space and facets. No spinal canal or neuroforaminal stenosis. L2-L3: Disc space narrowing  without herniation. No spinal canal or neural foraminal stenosis. L3-L4: Right asymmetric disc bulge with superimposed extraforaminal protrusion. Mild right foraminal narrowing with possible contact of the exiting right L3 nerve root. No spinal canal or left neural foraminal stenosis. L4-L5: Right asymmetric disc bulge. Mild narrowing of the right lateral recess. No central spinal canal or neural foraminal stenosis. L5-S1: Normal disc space and facets. No spinal canal or neuroforaminal stenosis. Visualized sacrum: Normal. IMPRESSION: 1. Burst fracture of L1 with 50% central height loss and 4-5 mm of retropulsion. Mild bone marrow edema. This is likely subacute. 2. Mild right L3-4 neural foraminal narrowing due to right asymmetric disc bulge with superimposed extraforaminal protrusion. Correlate for right L3 radiculopathy. 3. Mild narrowing of the right lateral recess at L4-L5, which may serve as a source of right L5 radiculopathy. Electronically Signed   By: Deatra Robinson M.D.   On: 01/24/2021 23:41   MR ABDOMEN W WO CONTRAST  Result Date: 01/26/2021 CLINICAL DATA:  Inpatient. Status post percutaneous drainage of posterior perinephric left renal abscess on 01/25/2021 yielding purulent fluid. Percutaneous right nephrostomy tube placement for staghorn calculus. EXAM: MRI ABDOMEN WITHOUT AND  WITH CONTRAST TECHNIQUE: Multiplanar multisequence MR imaging of the abdomen was performed both before and after the administration of intravenous contrast. CONTRAST:  8.64mL GADAVIST GADOBUTROL 1 MMOL/ML IV SOLN COMPARISON:  01/24/2021 CT chest, abdomen and pelvis. FINDINGS: Lower chest: Small to moderate right pleural effusion. Trace dependent left pleural effusion. Hepatobiliary: Normal liver size and configuration. No hepatic steatosis. Simple 2.4 cm left liver dome cyst. No suspicious liver masses. Normal gallbladder with no cholelithiasis. No biliary ductal dilatation. Common bile duct diameter 5 mm. No choledocholithiasis. No biliary masses, strictures or beading. Pancreas: No pancreatic mass or duct dilation.  No pancreas divisum. Spleen: Normal size. No mass. Adrenals/Urinary Tract: Normal adrenals. Mild right hydronephrosis with staghorn calculus filling the right renal pelvis and lower right renal collecting system, better seen on recent CT. Percutaneous nephrostomy tube appears to terminate in central right renal collecting system. Tiny mildly T1 hyperintense 0.9 cm renal cortical lesion in the interpolar right kidney (series 13/image 61) with questionable hypoenhancement on the subtraction sequences. Several simple bilateral renal cysts, largest 3.1 cm in lower left kidney. Complex exophytic 4.8 x 3.2 x 5.0 cm cystic mass in posterior upper left kidney (series 5/image 28) with percutaneous drain terminating centrally within the cystic portion of the mass, with thick irregular enhancing wall and surrounding fat stranding, mildly decreased from 5.7 x 4.4 x 6.0 cm on 01/24/2021 CT. Stomach/Bowel: Normal non-distended stomach. Visualized small and large bowel is normal caliber, with no bowel wall thickening. Vascular/Lymphatic: Atherosclerotic abdominal aorta with 3.4 cm infrarenal abdominal aortic aneurysm. Patent portal, splenic, hepatic and renal veins. No pathologically enlarged lymph nodes in the  abdomen. Other: No abdominal ascites or focal fluid collection. Musculoskeletal: No aggressive appearing focal osseous lesions. Moderate L1 burst fracture as detailed on lumbar spine MRI from 1 day prior. IMPRESSION: 1. Complex exophytic 4.8 x 3.2 x 5.0 cm cystic mass in the posterior upper left kidney with thick irregular enhancing wall and surrounding fat stranding, mildly decreased from 5.7 x 4.4 x 6.0 cm on 01/24/2021 CT status post interval percutaneous drainage with drain tip located centrally within the mass. Given the findings of purulent fluid on percutaneous drainage, imaging findings are compatible with perinephric abscess. 2. Indeterminate tiny 0.9 cm interpolar right renal cortical lesion with questionable low level enhancement, cannot exclude renal neoplasm. Suggest attention to the bilateral  renal findings on follow-up MRI abdomen without and with IV contrast in 3-6 months. 3. Mild right hydronephrosis with staghorn calculus filling the right renal pelvis and lower right renal collecting system, better seen on recent CT. Right percutaneous nephrostomy tube in place. 4. Small to moderate right and trace dependent left pleural effusions. 5. Moderate L1 burst fracture as detailed on lumbar spine MRI from 1 day prior. Electronically Signed   By: Delbert Phenix M.D.   On: 01/26/2021 08:14   IR Guided Horace Porteous W Catheter Placement  Result Date: 01/25/2021 INDICATION: Significant right-sided staghorn calculus with findings worrisome for emphysematous pyelonephritis. Please perform image guided placement of right-sided nephrostomy catheter for infection source control purposes. Additionally, the patient was found to have an indeterminate left-sided perinephric collection. Please perform ultrasound-guided biopsy and/or drainage catheter placement for diagnostic and potentially therapeutic purposes. EXAM: 1. ULTRASOUND-GUIDED LEFT PERINEPHRIC ABSCESS DRAINAGE CATHETER PLACEMENT 2. ULTRASOUND AND FLUOROSCOPIC  GUIDED PLACEMENT OF RIGHT NEPHROSTOMY TUBE COMPARISON:  CT chest, abdomen and pelvis-01/24/2021; lumbar spine MRI-01/24/2021 MEDICATIONS: Ancef 2 gm IV; The antibiotic was administered in an appropriate time frame prior to skin puncture. ANESTHESIA/SEDATION: Moderate (conscious) sedation was employed during this procedure. A total of Versed 2 mg and Fentanyl 100 mcg was administered intravenously. Moderate Sedation Time: 30 minutes. The patient's level of consciousness and vital signs were monitored continuously by radiology nursing throughout the procedure under my direct supervision. CONTRAST:  15mL Isovue 300 - administered into the right renal collecting system FLUOROSCOPY TIME:  3 minutes, 36 seconds (54 mGy) COMPLICATIONS: None immediate. PROCEDURE: The procedure, risks, benefits, and alternatives were explained to the patient, questions were encouraged and answered and informed consent was obtained. A timeout was performed prior to the initiation of the procedure. The operative sites were prepped and draped in the usual sterile fashion and a sterile drape was applied covering the operative field. A sterile gown and sterile gloves were used for the procedure. Local anesthesia was provided with 1% Lidocaine with epinephrine. Preprocedural spot fluoroscopic image demonstrates opacities overlying expected location of the bilateral renal fossa. Sonographic evaluation was performed of the left kidney demonstrating an approximately 6.3 x 4.3 cm complex apparent collection arising from the superior pole of the left kidney (image 2) correlating with the indeterminate collection/mass seen on preceding abdominal CT image 80, series 3) multiple ultrasound images were saved procedural documentation purposes. Under direct ultrasound guidance, the indeterminate complex collection was accessed with a 17 gauge trocar needle. At this point, a small amount of purulent fluid was aspirated confirming the collection likely  represents a perinephric abscess. As such, a short Amplatz wire was coiled within the collection. The track was dilated ultimately allowing placement of a 10 French percutaneous drainage catheter with end coiled and locked within the complex collection. At this point, approximately 50 cc of purulent fluid was aspirated. A representative sample of aspirated fluid was capped and sent to the laboratory for analysis Drainage catheter was flushed with a small amount of saline and connected to a JP bulb. The drainage catheter was secured in place within interrupted suture and a Stat Lock device. _________________________________________________________ Attention was now paid towards placement of the right-sided percutaneous nephrostomy catheter. Sonographic evaluation was performed of the right kidney demonstrating moderate dilatation involving the superior pole of the right kidney with echogenic shadowing stones seen at the level of the inferior pole at the level of the renal pelvis. Under direct ultrasound guidance, the dilated non stone containing calyx within the posterosuperior aspect of  the left kidney was targeted with a 20 gauge needle was advanced into the renal collecting system. An ultrasound image documentation was performed. Access within the collecting system was confirmed with the efflux of urine followed by limited contrast injection. Under intermittent fluoroscopic guidance, an 0.018 wire was advanced into the collecting system and the tract was dilated with an Accustick stent. Next, over a short Amplatz wire, the track was further dilated ultimately allowing placement of a 10-French percutaneous nephrostomy catheter with end coiled and locked within the renal pelvis. Contrast was injected and several spot fluoroscopic images were obtained in various obliquities. The catheter was secured at the skin entrance site with an interrupted suture and a stat lock device and connected to a gravity bag. Dressings  were applied. The patient tolerated both procedures well without immediate postprocedural complication. FINDINGS: Fluoroscopic demonstrates stones overlying expected location of the bilateral kidneys with dominant opacity overlying expected location of the right renal pelvis measuring at least 3.2 x 2.8 cm. Additional calculi overlie the expected location of the mid and inferior poles of the right kidney compatible with findings on preceding abdominal CT. Dominant left-sided opacity overlying the left renal fossa measures approximately 3.2 x 2.8 cm. Sonographic evaluation of the left kidney demonstrates an approximately 6.3 x 4.3 complex collection superior to the left kidney which was targeted with a 17 gauge needle yielding the return of purulent material. As such, a 10 French percutaneous drainage catheter was placed in this presumed perinephric abscess. Ultrasound scanning demonstrates moderate dilatation involving the superior pole of the right kidney with echogenic shadowing stones within the mid and inferior pole calices as was demonstrated on preceding abdominal CT. Under a combination of ultrasound and fluoroscopic guidance, a posterior superior calix was targeted allowing placement of a 10-French percutaneous nephrostomy catheter with end coiled and locked within the renal pelvis. Contrast injection confirmed appropriate positioning. IMPRESSION: 1. Successful ultrasound-guided placement of left-sided perinephric abscess drainage catheter yielding 50 cc of purulent fluid. 2. Successful ultrasound and fluoroscopic guided placement of a right sided 10 [place] PCN. This nephrostomy catheter may be utilized for future PCNL usage as indicated. Electronically Signed   By: Simonne Come M.D.   On: 01/25/2021 13:35   IR NEPHROSTOMY PLACEMENT RIGHT  Result Date: 01/25/2021 INDICATION: Significant right-sided staghorn calculus with findings worrisome for emphysematous pyelonephritis. Please perform image guided  placement of right-sided nephrostomy catheter for infection source control purposes. Additionally, the patient was found to have an indeterminate left-sided perinephric collection. Please perform ultrasound-guided biopsy and/or drainage catheter placement for diagnostic and potentially therapeutic purposes. EXAM: 1. ULTRASOUND-GUIDED LEFT PERINEPHRIC ABSCESS DRAINAGE CATHETER PLACEMENT 2. ULTRASOUND AND FLUOROSCOPIC GUIDED PLACEMENT OF RIGHT NEPHROSTOMY TUBE COMPARISON:  CT chest, abdomen and pelvis-01/24/2021; lumbar spine MRI-01/24/2021 MEDICATIONS: Ancef 2 gm IV; The antibiotic was administered in an appropriate time frame prior to skin puncture. ANESTHESIA/SEDATION: Moderate (conscious) sedation was employed during this procedure. A total of Versed 2 mg and Fentanyl 100 mcg was administered intravenously. Moderate Sedation Time: 30 minutes. The patient's level of consciousness and vital signs were monitored continuously by radiology nursing throughout the procedure under my direct supervision. CONTRAST:  22mL Isovue 300 - administered into the right renal collecting system FLUOROSCOPY TIME:  3 minutes, 36 seconds (54 mGy) COMPLICATIONS: None immediate. PROCEDURE: The procedure, risks, benefits, and alternatives were explained to the patient, questions were encouraged and answered and informed consent was obtained. A timeout was performed prior to the initiation of the procedure. The operative sites were prepped  and draped in the usual sterile fashion and a sterile drape was applied covering the operative field. A sterile gown and sterile gloves were used for the procedure. Local anesthesia was provided with 1% Lidocaine with epinephrine. Preprocedural spot fluoroscopic image demonstrates opacities overlying expected location of the bilateral renal fossa. Sonographic evaluation was performed of the left kidney demonstrating an approximately 6.3 x 4.3 cm complex apparent collection arising from the superior pole  of the left kidney (image 2) correlating with the indeterminate collection/mass seen on preceding abdominal CT image 80, series 3) multiple ultrasound images were saved procedural documentation purposes. Under direct ultrasound guidance, the indeterminate complex collection was accessed with a 17 gauge trocar needle. At this point, a small amount of purulent fluid was aspirated confirming the collection likely represents a perinephric abscess. As such, a short Amplatz wire was coiled within the collection. The track was dilated ultimately allowing placement of a 10 French percutaneous drainage catheter with end coiled and locked within the complex collection. At this point, approximately 50 cc of purulent fluid was aspirated. A representative sample of aspirated fluid was capped and sent to the laboratory for analysis Drainage catheter was flushed with a small amount of saline and connected to a JP bulb. The drainage catheter was secured in place within interrupted suture and a Stat Lock device. _________________________________________________________ Attention was now paid towards placement of the right-sided percutaneous nephrostomy catheter. Sonographic evaluation was performed of the right kidney demonstrating moderate dilatation involving the superior pole of the right kidney with echogenic shadowing stones seen at the level of the inferior pole at the level of the renal pelvis. Under direct ultrasound guidance, the dilated non stone containing calyx within the posterosuperior aspect of the left kidney was targeted with a 20 gauge needle was advanced into the renal collecting system. An ultrasound image documentation was performed. Access within the collecting system was confirmed with the efflux of urine followed by limited contrast injection. Under intermittent fluoroscopic guidance, an 0.018 wire was advanced into the collecting system and the tract was dilated with an Accustick stent. Next, over a short  Amplatz wire, the track was further dilated ultimately allowing placement of a 10-French percutaneous nephrostomy catheter with end coiled and locked within the renal pelvis. Contrast was injected and several spot fluoroscopic images were obtained in various obliquities. The catheter was secured at the skin entrance site with an interrupted suture and a stat lock device and connected to a gravity bag. Dressings were applied. The patient tolerated both procedures well without immediate postprocedural complication. FINDINGS: Fluoroscopic demonstrates stones overlying expected location of the bilateral kidneys with dominant opacity overlying expected location of the right renal pelvis measuring at least 3.2 x 2.8 cm. Additional calculi overlie the expected location of the mid and inferior poles of the right kidney compatible with findings on preceding abdominal CT. Dominant left-sided opacity overlying the left renal fossa measures approximately 3.2 x 2.8 cm. Sonographic evaluation of the left kidney demonstrates an approximately 6.3 x 4.3 complex collection superior to the left kidney which was targeted with a 17 gauge needle yielding the return of purulent material. As such, a 10 French percutaneous drainage catheter was placed in this presumed perinephric abscess. Ultrasound scanning demonstrates moderate dilatation involving the superior pole of the right kidney with echogenic shadowing stones within the mid and inferior pole calices as was demonstrated on preceding abdominal CT. Under a combination of ultrasound and fluoroscopic guidance, a posterior superior calix was targeted allowing placement of a  10-French percutaneous nephrostomy catheter with end coiled and locked within the renal pelvis. Contrast injection confirmed appropriate positioning. IMPRESSION: 1. Successful ultrasound-guided placement of left-sided perinephric abscess drainage catheter yielding 50 cc of purulent fluid. 2. Successful ultrasound and  fluoroscopic guided placement of a right sided 10 [place] PCN. This nephrostomy catheter may be utilized for future PCNL usage as indicated. Electronically Signed   By: Simonne ComeJohn  Watts M.D.   On: 01/25/2021 13:35    Labs:  CBC: Recent Labs    01/24/21 1323 01/25/21 0222 01/26/21 0250 01/27/21 0359  WBC 9.7 9.5 9.6 8.8  HGB 9.8* 8.5* 8.1* 7.8*  HCT 31.3* 27.8* 25.7* 24.0*  PLT 395 327 261 259    COAGS: Recent Labs    01/25/21 1013  INR 1.6*    BMP: Recent Labs    02/06/20 0850 02/07/20 0919 02/08/20 0640 02/09/20 0709 06/02/20 0853 01/24/21 1323 01/25/21 0222 01/26/21 0250 01/27/21 0359  NA 140 139 139 139   < > 136 136 134* 135  K 3.7 3.6 3.3* 3.6   < > 3.2* 3.2* 3.1* 3.2*  CL 102 100 101 101   < > 101 103 103 105  CO2 28 29 29 29    < > 24 22 20* 21*  GLUCOSE 129* 175* 114* 123*   < > 120* 116* 112* 109*  BUN 14 13 16 18    < > 24* 24* 20 20  CALCIUM 8.8* 9.1 8.9 8.9   < > 8.9 8.7* 8.4* 8.7*  CREATININE 1.18 1.27* 1.22 1.22   < > 2.11* 1.88* 1.63* 1.63*  GFRNONAA 57* 52* 55* 55*   < > 30* 35* 42* 42*  GFRAA >60 >60 >60 >60  --   --   --   --   --    < > = values in this interval not displayed.    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 Right PCN stable with approximately 25 cc dark gold urine in gravity bag (additional 75 cc output from drain in past 24 hours per chart). Continue current PCN management- continue with Qshift monitor of output; tube to be managed by urology on OP basis.  Left perinephric fluid collection s/p left perinephric drain placement in IR 01/25/2021. Left perinephric drain stable with approximately 15 cc dark red fluid in suction bulb  (additional 35 cc output from drain in past 24 hours per chart). Continue current drain management- continue with Qshift flushes/monitor of output. Plan for repeat CT when output <10 cc/day (assess for possible removal).  Further plans per IMTS/urology- appreciate and agree with management. IR to follow.   Electronically Signed: Elwin MochaAlexandra Nichollas Perusse, PA-C 01/27/2021, 11:11 AM   I spent a total of 15 Minutes at the the patient's bedside AND on the patient's hospital floor or unit, greater than 50% of which was counseling/coordinating care for right emphysematous pyelonephritis s/p right PCN placement AND left perinephric fluid collection s/p left perinephric drain placement.

## 2021-01-27 NOTE — TOC Progression Note (Signed)
Transition of Care [facility]) - Progression Note    Patient Details  Name: Alfred Mercado MRN: [id number] Date of Birth: 03/08/1936  Transition of Care [facility] CM/SW Contact  Lockie Pares, RN Phone Number: 01/27/2021, 4:34 PM  Clinical Narrative:     Will need HH aide, PT OT as recommended by evaluation.  Continues with drain and IV antibiotics.  Urology recommending 2-3 weeks of antibiotic therapy. Pam from [employer] made aware  Of possibility of IV antibiotics. HH will be difficult if not impossible with current living situation. May be prudent to look at SNF for rehab and IV abx vs home. CM and CSW to follow. Attempted to call patient to discuss. No answer.        Expected Discharge Plan and Services         Living arrangements for the past 2 months: No permanent address,Hotel/Motel (Lives at [facility])                                       Social Determinants of Health (SDOH) Interventions    Readmission Risk Interventions No flowsheet data found.

## 2021-01-27 NOTE — Progress Notes (Signed)
Occupational Therapy Treatment Patient Details Name: Alfred Mercado MRN: [id number] DOB: [birth year] Today's Date: 01/27/2021    History of present illness 85 year old man who presented with progressive weakness and falls for a few weeks. Hitting his back (lumbar burst fracture present on imaging), also remembers a fall where he may have lost consciousness and he had difficulty getting up. In ED found to have comminuted L1 burst fracture with retropulsion  Admitted 01/24/21 for treatment of Complicated urinary tract infection with possible pyelonephritic abscess, Left Complex cystic renal mass, Right Staghorn renal calculus with emphysematous features in the right renal collecting system.  PMH of HFpEF, Afib on eliquis, right pleural effusion s/p thoracentesis, tobacco use   OT comments  Pt. Seen for skilled OT treatment session.  Session limited by pt. Report of "not feeling well all over" and "not wanting to do it".  Pt. Would refuse attempts at therapy but then ask what we were doing next.  Agreeable to bed mobility this day in prep for continued progression with mobility and adl completion.  Mod/max a for oob. Cues for log roll with inconsistent return of demo and instructions.  Once eob engaged in minimal activity of having some orange juice then wanted back to bed.  Able to scoot towards hob x3 with max encouragement and instructions.  No physical assistance for sit to sidelying only to manage drains and catheter bag.  Will continue with acute ot goals and progress activity as pt. Able.    Follow Up Recommendations  Home health OT;Other (comment)    Equipment Recommendations  Tub/shower seat;Other (comment)    Recommendations for Other Services      Precautions / Restrictions Precautions Precautions: Fall Precaution Comments: 2-3 falls in last month, dizziness, Required Braces or Orthoses: Spinal Brace Spinal Brace: Thoracolumbosacral orthotic Spinal Brace Comments: has TLSO in room that  was to be used for comfort with OOB, however now he has 2 drains coming out of his back making it painful and difficulty to place brace       Mobility Bed Mobility Overal bed mobility: Needs Assistance Bed Mobility: Rolling;Sidelying to Sit;Sit to Sidelying Rolling: Mod assist Sidelying to sit: Max assist     Sit to sidelying: Min guard General bed mobility comments: maximal vc for log rolling, management of LE off bed and especially for bringing trunk to upright, min physical assist to bring trunk to upright able to bring bles back in bed and into sidelying without physical assistance    Transfers                 General transfer comment: pt. refused transfers but agreeable to scooting length of bed to get back in bed. max encouragement and physical demo then pt. would initiate the scoot    Balance                                           ADL either performed or assessed with clinical judgement   ADL Overall ADL's : Needs assistance/impaired Eating/Feeding: Set up;Independent;Sitting Eating/Feeding Details (indicate cue type and reason): sat eob and had sips of orange juice, adamantly declining eating breakfast                                   General ADL Comments: pt educated on log  roll to get to EOB, body mechanics     Vision       Perception     Praxis      Cognition Arousal/Alertness: Lethargic Behavior During Therapy: Agitated Overall Cognitive Status: Impaired/Different from baseline Area of Impairment: Safety/judgement;Problem solving                         Safety/Judgement: Decreased awareness of safety;Decreased awareness of deficits   Problem Solving: Slow processing;Requires verbal cues General Comments: eyes closed most of session.  would be very agitated and refuse to complete desired task then open eyes moments later and say "okay what now what are we doing" happened throughout session. refusal,  then asking what we were going to do next        Exercises     Shoulder Instructions       General Comments      Pertinent Vitals/ Pain       Pain Assessment: Faces Faces Pain Scale: Hurts even more Pain Location: back with getting out of bed Pain Descriptors / Indicators: Grimacing;Guarding;Discomfort Pain Intervention(s): Limited activity within patient's tolerance;Monitored during session;Repositioned  Home Living                                          Prior Functioning/Environment              Frequency  Min 2X/week        Progress Toward Goals  OT Goals(current goals can now be found in the care plan section)  Progress towards OT goals: Progressing toward goals     Plan      Co-evaluation                 AM-PAC OT "6 Clicks" Daily Activity     Outcome Measure   Help from another person eating meals?: None Help from another person taking care of personal grooming?: A Little Help from another person toileting, which includes using toliet, bedpan, or urinal?: A Lot Help from another person bathing (including washing, rinsing, drying)?: A Lot Help from another person to put on and taking off regular upper body clothing?: A Little Help from another person to put on and taking off regular lower body clothing?: A Lot 6 Click Score: 16    End of Session    OT Visit Diagnosis: Unsteadiness on feet (R26.81);Other abnormalities of gait and mobility (R26.89);Muscle weakness (generalized) (M62.81);History of falling (Z91.81);Pain;Other symptoms and signs involving cognitive function   Activity Tolerance Patient limited by pain;Patient limited by fatigue   Patient Left in bed;with call bell/phone within reach;with bed alarm set   Nurse Communication          Time: 7628-3151 OT Time Calculation (min): 17 min  Charges: OT General Charges $OT Visit: 1 Visit OT Treatments $Therapeutic Activity: 8-22 mins  Boneta Lucks,  COTA/L Acute Rehabilitation [phone]   Alessandra Bevels  01/27/2021, 11:44 AM

## 2021-01-27 NOTE — Progress Notes (Signed)
Urology Inpatient Progress Report    Intv/Subj: Patient urine culture/perinephric abscess culture pending.  He now has b/l PCN drains and foley.    Principal Problem:   Complicated urinary tract infection Active Problems:   Hypokalemia   Acute-on-chronic kidney injury (HCC)   Left renal mass   Staghorn renal calculus   Microscopic hematuria   Aortic aneurysm (HCC)   Closed burst fracture of lumbar vertebra (HCC)   Severe sepsis (HCC)   Iron deficiency anemia   Generalized weakness  Current Facility-Administered Medications  Medication Dose Route Frequency Provider Last Rate Last Admin  . 0.9 %  sodium chloride infusion   Intravenous PRN Inez Catalina, MD   Stopped at 01/25/21 2204  . acetaminophen (TYLENOL) tablet 650 mg  650 mg Oral Q6H PRN Alphonzo Severance, MD       Or  . acetaminophen (TYLENOL) suppository 650 mg  650 mg Rectal Q6H PRN Alphonzo Severance, MD      . albuterol (PROVENTIL) (2.5 MG/3ML) 0.083% nebulizer solution 2.5 mg  2.5 mg Nebulization Q6H PRN Inez Catalina, MD      . apixaban Everlene Balls) tablet 2.5 mg  2.5 mg Oral BID Steffanie Rainwater, MD   2.5 mg at 01/26/21 2153  . bisacodyl (DULCOLAX) EC tablet 20 mg  20 mg Oral Daily PRN Alphonzo Severance, MD      . ceFAZolin (ANCEF) IVPB 2g/100 mL premix  2 g Intravenous Once Bruning, Kevin, PA-C      . ceFEPIme (MAXIPIME) 2 g in sodium chloride 0.9 % 100 mL IVPB  2 g Intravenous Q24H Alphonzo Severance, MD 200 mL/hr at 01/26/21 2205 2 g at 01/26/21 2205  . Chlorhexidine Gluconate Cloth 2 % PADS 6 each  6 each Topical Daily Inez Catalina, MD   6 each at 01/26/21 1030  . HYDROmorphone (DILAUDID) injection 0.5 mg  0.5 mg Intravenous Q4H PRN Albertha Ghee, MD   0.5 mg at 01/26/21 1032  . LORazepam (ATIVAN) injection 1 mg  1 mg Intravenous Once Albertha Ghee, MD      . metoprolol tartrate (LOPRESSOR) tablet 25 mg  25 mg Oral BID Steffanie Rainwater, MD   25 mg at 01/26/21 2153  . nicotine (NICODERM CQ - dosed in mg/24 hr) patch 7 mg  7  mg Transdermal Q0600 Alphonzo Severance, MD   7 mg at 01/26/21 0800  . oxyCODONE (Oxy IR/ROXICODONE) immediate release tablet 5 mg  5 mg Oral Q4H PRN Albertha Ghee, MD   5 mg at 01/26/21 2201  . polyvinyl alcohol (LIQUIFILM TEARS) 1.4 % ophthalmic solution 1 drop  1 drop Both Eyes PRN Inez Catalina, MD   1 drop at 01/26/21 1301  . potassium chloride SA (KLOR-CON) CR tablet 40 mEq  40 mEq Oral Once Glenford Bayley, MD      . tamsulosin [facility]) capsule 0.4 mg  0.4 mg Oral Daily Steffanie Rainwater, MD   0.4 mg at 01/26/21 1501  . umeclidinium bromide (INCRUSE ELLIPTA) 62.5 MCG/INH 1 puff  1 puff Inhalation Daily Alphonzo Severance, MD         Objective: Vital: Vitals:   01/26/21 0953 01/26/21 1754 01/26/21 2107 01/27/21 0415  BP: 122/69 103/64 116/71 (!) 113/42  Pulse: (!) 106 74 (!) 103 (!) 104  Resp: 18 17 20 15   Temp: 98.7 F (37.1 C) 98.4 F (36.9 C) 98.4 F (36.9 C) 97.7 F (36.5 C)  TempSrc:   Oral Oral  SpO2: 96% 98% 93% 97%  Weight:      Height:       I/Os: I/O last 3 completed shifts: In: 942.5 [P.O.:760; I.V.:0.9; IV Piggyback:181.6] Out: 3050 [Urine:2975; Drains:75]  Physical Exam:  General: Patient is in no apparent distress Lungs: Normal respiratory effort, chest expands symmetrically. GI: The abdomen is soft and nontender without mass. L JP: purulent output R PCN: purulent urine drainage Foley: draining purulent yellow urine Ext: lower extremities symmetric  Lab Results: Recent Labs    01/25/21 0222 01/26/21 0250 01/27/21 0359  WBC 9.5 9.6 8.8  HGB 8.5* 8.1* 7.8*  HCT 27.8* 25.7* 24.0*   Recent Labs    01/25/21 0222 01/26/21 0250 01/27/21 0359  NA 136 134* 135  K 3.2* 3.1* 3.2*  CL 103 103 105  CO2 22 20* 21*  GLUCOSE 116* 112* 109*  BUN 24* 20 20  CREATININE 1.88* 1.63* 1.63*  CALCIUM 8.7* 8.4* 8.7*   Recent Labs    01/25/21 1013  INR 1.6*   No results for input(s): LABURIN in the last 72 hours. Results for orders placed or performed during  the hospital encounter of 01/24/21  Resp Panel by RT-PCR (Flu A&B, Covid) Nasopharyngeal Swab     Status: None   Collection Time: 01/25/21 12:09 AM   Specimen: Nasopharyngeal Swab; Nasopharyngeal(NP) swabs in vial transport medium  Result Value Ref Range Status   SARS Coronavirus 2 by RT PCR NEGATIVE NEGATIVE Final    Comment: (NOTE) SARS-CoV-2 target nucleic acids are NOT DETECTED.  The SARS-CoV-2 RNA is generally detectable in upper respiratory specimens during the acute phase of infection. The lowest concentration of SARS-CoV-2 viral copies this assay can detect is 138 copies/mL. A negative result does not preclude SARS-Cov-2 infection and should not be used as the sole basis for treatment or other patient management decisions. A negative result may occur with  improper specimen collection/handling, submission of specimen other than nasopharyngeal swab, presence of viral mutation(s) within the areas targeted by this assay, and inadequate number of viral copies(<138 copies/mL). A negative result must be combined with clinical observations, patient history, and epidemiological information. The expected result is Negative.  Fact Sheet for Patients:  BloggerCourse.com  Fact Sheet for Healthcare Providers:  SeriousBroker.it  This test is no t yet approved or cleared by the [place] FDA and  has been authorized for detection and/or diagnosis of SARS-CoV-2 by FDA under an Emergency Use Authorization (EUA). This EUA will remain  in effect (meaning this test can be used) for the duration of the COVID-19 declaration under Section 564(b)(1) of the Act, 21 U.S.C.section 360bbb-3(b)(1), unless the authorization is terminated  or revoked sooner.       Influenza A by PCR NEGATIVE NEGATIVE Final   Influenza B by PCR NEGATIVE NEGATIVE Final    Comment: (NOTE) The Xpert Xpress SARS-CoV-2/FLU/RSV plus assay is intended as an aid in the  diagnosis of influenza from Nasopharyngeal swab specimens and should not be used as a sole basis for treatment. Nasal washings and aspirates are unacceptable for Xpert Xpress SARS-CoV-2/FLU/RSV testing.  Fact Sheet for Patients: BloggerCourse.com  Fact Sheet for Healthcare Providers: SeriousBroker.it  This test is not yet approved or cleared by the [place] FDA and has been authorized for detection and/or diagnosis of SARS-CoV-2 by FDA under an Emergency Use Authorization (EUA). This EUA will remain in effect (meaning this test can be used) for the duration of the COVID-19 declaration under Section 564(b)(1) of the Act, 21 U.S.C. section 360bbb-3(b)(1), unless the authorization is  terminated or revoked.  Performed at [facility], [address] [place], [place] [address]   Culture, blood (routine x 2)     Status: None (Preliminary result)   Collection Time: 01/25/21  2:24 AM   Specimen: BLOOD LEFT HAND  Result Value Ref Range Status   Specimen Description BLOOD LEFT HAND  Final   Special Requests   Final    BOTTLES DRAWN AEROBIC AND ANAEROBIC Blood Culture results may not be optimal due to an inadequate volume of blood received in culture bottles   Culture   Final    NO GROWTH 2 DAYS Performed at [facility], [address], [place], [place] [address]    Report Status PENDING  Incomplete  Urine culture     Status: Abnormal   Collection Time: 01/25/21  3:04 AM   Specimen: Urine, Random  Result Value Ref Range Status   Specimen Description URINE, RANDOM  Final   Special Requests   Final    NONE Performed at [facility], [address] [place], [place] [address]    Culture MULTIPLE SPECIES PRESENT, SUGGEST RECOLLECTION (A)  Final   Report Status 01/26/2021 FINAL  Final  Urine culture     Status: Abnormal   Collection Time: 01/25/21  1:15 PM   Specimen: Urine, Catheterized  Result Value Ref Range  Status   Specimen Description URINE, CATHETERIZED  Final   Special Requests   Final    RIGHT KIDNEY Performed at [facility], [address], [place], [place] [address]    Culture MULTIPLE SPECIES PRESENT, SUGGEST RECOLLECTION (A)  Final   Report Status 01/26/2021 FINAL  Final  Aerobic/Anaerobic Culture (surgical/deep wound)     Status: None (Preliminary result)   Collection Time: 01/25/21  1:15 PM   Specimen: Abscess  Result Value Ref Range Status   Specimen Description ABSCESS LEFT PERI RENAL  Final   Special Requests LEFT PERI RENAL  Final   Gram Stain   Final    ABUNDANT WBC PRESENT, PREDOMINANTLY PMN NO ORGANISMS SEEN    Culture   Final    CULTURE REINCUBATED FOR BETTER GROWTH Performed at [facility], [address], [place], [place] [address]    Report Status PENDING  Incomplete  Culture, blood (Routine X 2) w Reflex to ID Panel     Status: None (Preliminary result)   Collection Time: 01/26/21  2:50 AM   Specimen: BLOOD  Result Value Ref Range Status   Specimen Description BLOOD RIGHT HAND  Final   Special Requests   Final    BOTTLES DRAWN AEROBIC AND ANAEROBIC Blood Culture adequate volume   Culture   Final    NO GROWTH 1 DAY Performed at [facility], [address], [place], [place] [address]    Report Status PENDING  Incomplete    Studies/Results: MR ABDOMEN W WO CONTRAST  Result Date: 01/26/2021 CLINICAL DATA:  Inpatient. Status post percutaneous drainage of posterior perinephric left renal abscess on 01/25/2021 yielding purulent fluid. Percutaneous right nephrostomy tube placement for staghorn calculus. EXAM: MRI ABDOMEN WITHOUT AND WITH CONTRAST TECHNIQUE: Multiplanar multisequence MR imaging of the abdomen was performed both before and after the administration of intravenous contrast. CONTRAST:  8.87mL GADAVIST GADOBUTROL 1 MMOL/ML IV SOLN COMPARISON:  01/24/2021 CT chest, abdomen and pelvis. FINDINGS: Lower chest: Small to moderate right  pleural effusion. Trace dependent left pleural effusion. Hepatobiliary: Normal liver size and configuration. No hepatic steatosis.  Simple 2.4 cm left liver dome cyst. No suspicious liver masses. Normal gallbladder with no cholelithiasis. No biliary ductal dilatation. Common bile duct diameter 5 mm. No choledocholithiasis. No biliary masses, strictures or beading. Pancreas: No pancreatic mass or duct dilation.  No pancreas divisum. Spleen: Normal size. No mass. Adrenals/Urinary Tract: Normal adrenals. Mild right hydronephrosis with staghorn calculus filling the right renal pelvis and lower right renal collecting system, better seen on recent CT. Percutaneous nephrostomy tube appears to terminate in central right renal collecting system. Tiny mildly T1 hyperintense 0.9 cm renal cortical lesion in the interpolar right kidney (series 13/image 61) with questionable hypoenhancement on the subtraction sequences. Several simple bilateral renal cysts, largest 3.1 cm in lower left kidney. Complex exophytic 4.8 x 3.2 x 5.0 cm cystic mass in posterior upper left kidney (series 5/image 28) with percutaneous drain terminating centrally within the cystic portion of the mass, with thick irregular enhancing wall and surrounding fat stranding, mildly decreased from 5.7 x 4.4 x 6.0 cm on 01/24/2021 CT. Stomach/Bowel: Normal non-distended stomach. Visualized small and large bowel is normal caliber, with no bowel wall thickening. Vascular/Lymphatic: Atherosclerotic abdominal aorta with 3.4 cm infrarenal abdominal aortic aneurysm. Patent portal, splenic, hepatic and renal veins. No pathologically enlarged lymph nodes in the abdomen. Other: No abdominal ascites or focal fluid collection. Musculoskeletal: No aggressive appearing focal osseous lesions. Moderate L1 burst fracture as detailed on lumbar spine MRI from 1 day prior. IMPRESSION: 1. Complex exophytic 4.8 x 3.2 x 5.0 cm cystic mass in the posterior upper left kidney with thick  irregular enhancing wall and surrounding fat stranding, mildly decreased from 5.7 x 4.4 x 6.0 cm on 01/24/2021 CT status post interval percutaneous drainage with drain tip located centrally within the mass. Given the findings of purulent fluid on percutaneous drainage, imaging findings are compatible with perinephric abscess. 2. Indeterminate tiny 0.9 cm interpolar right renal cortical lesion with questionable low level enhancement, cannot exclude renal neoplasm. Suggest attention to the bilateral renal findings on follow-up MRI abdomen without and with IV contrast in 3-6 months. 3. Mild right hydronephrosis with staghorn calculus filling the right renal pelvis and lower right renal collecting system, better seen on recent CT. Right percutaneous nephrostomy tube in place. 4. Small to moderate right and trace dependent left pleural effusions. 5. Moderate L1 burst fracture as detailed on lumbar spine MRI from 1 day prior. Electronically Signed   By: Delbert Phenix M.D.   On: 01/26/2021 08:14   IR Guided Horace Porteous W Catheter Placement  Result Date: 01/25/2021 INDICATION: Significant right-sided staghorn calculus with findings worrisome for emphysematous pyelonephritis. Please perform image guided placement of right-sided nephrostomy catheter for infection source control purposes. Additionally, the patient was found to have an indeterminate left-sided perinephric collection. Please perform ultrasound-guided biopsy and/or drainage catheter placement for diagnostic and potentially therapeutic purposes. EXAM: 1. ULTRASOUND-GUIDED LEFT PERINEPHRIC ABSCESS DRAINAGE CATHETER PLACEMENT 2. ULTRASOUND AND FLUOROSCOPIC GUIDED PLACEMENT OF RIGHT NEPHROSTOMY TUBE COMPARISON:  CT chest, abdomen and pelvis-01/24/2021; lumbar spine MRI-01/24/2021 MEDICATIONS: Ancef 2 gm IV; The antibiotic was administered in an appropriate time frame prior to skin puncture. ANESTHESIA/SEDATION: Moderate (conscious) sedation was employed during this  procedure. A total of Versed 2 mg and Fentanyl 100 mcg was administered intravenously. Moderate Sedation Time: 30 minutes. The patient's level of consciousness and vital signs were monitored continuously by radiology nursing throughout the procedure under my direct supervision. CONTRAST:  15mL Isovue 300 - administered into the right renal collecting system FLUOROSCOPY TIME:  3 minutes,  36 seconds (54 mGy) COMPLICATIONS: None immediate. PROCEDURE: The procedure, risks, benefits, and alternatives were explained to the patient, questions were encouraged and answered and informed consent was obtained. A timeout was performed prior to the initiation of the procedure. The operative sites were prepped and draped in the usual sterile fashion and a sterile drape was applied covering the operative field. A sterile gown and sterile gloves were used for the procedure. Local anesthesia was provided with 1% Lidocaine with epinephrine. Preprocedural spot fluoroscopic image demonstrates opacities overlying expected location of the bilateral renal fossa. Sonographic evaluation was performed of the left kidney demonstrating an approximately 6.3 x 4.3 cm complex apparent collection arising from the superior pole of the left kidney (image 2) correlating with the indeterminate collection/mass seen on preceding abdominal CT image 80, series 3) multiple ultrasound images were saved procedural documentation purposes. Under direct ultrasound guidance, the indeterminate complex collection was accessed with a 17 gauge trocar needle. At this point, a small amount of purulent fluid was aspirated confirming the collection likely represents a perinephric abscess. As such, a short Amplatz wire was coiled within the collection. The track was dilated ultimately allowing placement of a 10 French percutaneous drainage catheter with end coiled and locked within the complex collection. At this point, approximately 50 cc of purulent fluid was aspirated.  A representative sample of aspirated fluid was capped and sent to the laboratory for analysis Drainage catheter was flushed with a small amount of saline and connected to a JP bulb. The drainage catheter was secured in place within interrupted suture and a Stat Lock device. _________________________________________________________ Attention was now paid towards placement of the right-sided percutaneous nephrostomy catheter. Sonographic evaluation was performed of the right kidney demonstrating moderate dilatation involving the superior pole of the right kidney with echogenic shadowing stones seen at the level of the inferior pole at the level of the renal pelvis. Under direct ultrasound guidance, the dilated non stone containing calyx within the posterosuperior aspect of the left kidney was targeted with a 20 gauge needle was advanced into the renal collecting system. An ultrasound image documentation was performed. Access within the collecting system was confirmed with the efflux of urine followed by limited contrast injection. Under intermittent fluoroscopic guidance, an 0.018 wire was advanced into the collecting system and the tract was dilated with an Accustick stent. Next, over a short Amplatz wire, the track was further dilated ultimately allowing placement of a 10-French percutaneous nephrostomy catheter with end coiled and locked within the renal pelvis. Contrast was injected and several spot fluoroscopic images were obtained in various obliquities. The catheter was secured at the skin entrance site with an interrupted suture and a stat lock device and connected to a gravity bag. Dressings were applied. The patient tolerated both procedures well without immediate postprocedural complication. FINDINGS: Fluoroscopic demonstrates stones overlying expected location of the bilateral kidneys with dominant opacity overlying expected location of the right renal pelvis measuring at least 3.2 x 2.8 cm. Additional  calculi overlie the expected location of the mid and inferior poles of the right kidney compatible with findings on preceding abdominal CT. Dominant left-sided opacity overlying the left renal fossa measures approximately 3.2 x 2.8 cm. Sonographic evaluation of the left kidney demonstrates an approximately 6.3 x 4.3 complex collection superior to the left kidney which was targeted with a 17 gauge needle yielding the return of purulent material. As such, a 10 French percutaneous drainage catheter was placed in this presumed perinephric abscess. Ultrasound scanning demonstrates moderate  dilatation involving the superior pole of the right kidney with echogenic shadowing stones within the mid and inferior pole calices as was demonstrated on preceding abdominal CT. Under a combination of ultrasound and fluoroscopic guidance, a posterior superior calix was targeted allowing placement of a 10-French percutaneous nephrostomy catheter with end coiled and locked within the renal pelvis. Contrast injection confirmed appropriate positioning. IMPRESSION: 1. Successful ultrasound-guided placement of left-sided perinephric abscess drainage catheter yielding 50 cc of purulent fluid. 2. Successful ultrasound and fluoroscopic guided placement of a right sided 10 [place] PCN. This nephrostomy catheter may be utilized for future PCNL usage as indicated. Electronically Signed   By: Simonne ComeJohn  Watts M.D.   On: 01/25/2021 13:35   IR NEPHROSTOMY PLACEMENT RIGHT  Result Date: 01/25/2021 INDICATION: Significant right-sided staghorn calculus with findings worrisome for emphysematous pyelonephritis. Please perform image guided placement of right-sided nephrostomy catheter for infection source control purposes. Additionally, the patient was found to have an indeterminate left-sided perinephric collection. Please perform ultrasound-guided biopsy and/or drainage catheter placement for diagnostic and potentially therapeutic purposes. EXAM: 1.  ULTRASOUND-GUIDED LEFT PERINEPHRIC ABSCESS DRAINAGE CATHETER PLACEMENT 2. ULTRASOUND AND FLUOROSCOPIC GUIDED PLACEMENT OF RIGHT NEPHROSTOMY TUBE COMPARISON:  CT chest, abdomen and pelvis-01/24/2021; lumbar spine MRI-01/24/2021 MEDICATIONS: Ancef 2 gm IV; The antibiotic was administered in an appropriate time frame prior to skin puncture. ANESTHESIA/SEDATION: Moderate (conscious) sedation was employed during this procedure. A total of Versed 2 mg and Fentanyl 100 mcg was administered intravenously. Moderate Sedation Time: 30 minutes. The patient's level of consciousness and vital signs were monitored continuously by radiology nursing throughout the procedure under my direct supervision. CONTRAST:  15mL Isovue 300 - administered into the right renal collecting system FLUOROSCOPY TIME:  3 minutes, 36 seconds (54 mGy) COMPLICATIONS: None immediate. PROCEDURE: The procedure, risks, benefits, and alternatives were explained to the patient, questions were encouraged and answered and informed consent was obtained. A timeout was performed prior to the initiation of the procedure. The operative sites were prepped and draped in the usual sterile fashion and a sterile drape was applied covering the operative field. A sterile gown and sterile gloves were used for the procedure. Local anesthesia was provided with 1% Lidocaine with epinephrine. Preprocedural spot fluoroscopic image demonstrates opacities overlying expected location of the bilateral renal fossa. Sonographic evaluation was performed of the left kidney demonstrating an approximately 6.3 x 4.3 cm complex apparent collection arising from the superior pole of the left kidney (image 2) correlating with the indeterminate collection/mass seen on preceding abdominal CT image 80, series 3) multiple ultrasound images were saved procedural documentation purposes. Under direct ultrasound guidance, the indeterminate complex collection was accessed with a 17 gauge trocar needle.  At this point, a small amount of purulent fluid was aspirated confirming the collection likely represents a perinephric abscess. As such, a short Amplatz wire was coiled within the collection. The track was dilated ultimately allowing placement of a 10 French percutaneous drainage catheter with end coiled and locked within the complex collection. At this point, approximately 50 cc of purulent fluid was aspirated. A representative sample of aspirated fluid was capped and sent to the laboratory for analysis Drainage catheter was flushed with a small amount of saline and connected to a JP bulb. The drainage catheter was secured in place within interrupted suture and a Stat Lock device. _________________________________________________________ Attention was now paid towards placement of the right-sided percutaneous nephrostomy catheter. Sonographic evaluation was performed of the right kidney demonstrating moderate dilatation involving the superior pole of the right  kidney with echogenic shadowing stones seen at the level of the inferior pole at the level of the renal pelvis. Under direct ultrasound guidance, the dilated non stone containing calyx within the posterosuperior aspect of the left kidney was targeted with a 20 gauge needle was advanced into the renal collecting system. An ultrasound image documentation was performed. Access within the collecting system was confirmed with the efflux of urine followed by limited contrast injection. Under intermittent fluoroscopic guidance, an 0.018 wire was advanced into the collecting system and the tract was dilated with an Accustick stent. Next, over a short Amplatz wire, the track was further dilated ultimately allowing placement of a 10-French percutaneous nephrostomy catheter with end coiled and locked within the renal pelvis. Contrast was injected and several spot fluoroscopic images were obtained in various obliquities. The catheter was secured at the skin entrance  site with an interrupted suture and a stat lock device and connected to a gravity bag. Dressings were applied. The patient tolerated both procedures well without immediate postprocedural complication. FINDINGS: Fluoroscopic demonstrates stones overlying expected location of the bilateral kidneys with dominant opacity overlying expected location of the right renal pelvis measuring at least 3.2 x 2.8 cm. Additional calculi overlie the expected location of the mid and inferior poles of the right kidney compatible with findings on preceding abdominal CT. Dominant left-sided opacity overlying the left renal fossa measures approximately 3.2 x 2.8 cm. Sonographic evaluation of the left kidney demonstrates an approximately 6.3 x 4.3 complex collection superior to the left kidney which was targeted with a 17 gauge needle yielding the return of purulent material. As such, a 10 French percutaneous drainage catheter was placed in this presumed perinephric abscess. Ultrasound scanning demonstrates moderate dilatation involving the superior pole of the right kidney with echogenic shadowing stones within the mid and inferior pole calices as was demonstrated on preceding abdominal CT. Under a combination of ultrasound and fluoroscopic guidance, a posterior superior calix was targeted allowing placement of a 10-French percutaneous nephrostomy catheter with end coiled and locked within the renal pelvis. Contrast injection confirmed appropriate positioning. IMPRESSION: 1. Successful ultrasound-guided placement of left-sided perinephric abscess drainage catheter yielding 50 cc of purulent fluid. 2. Successful ultrasound and fluoroscopic guided placement of a right sided 10 [place] PCN. This nephrostomy catheter may be utilized for future PCNL usage as indicated. Electronically Signed   By: Simonne Come M.D.   On: 01/25/2021 13:35    Assessment/Plan:  1. Right staghorn calculus with emphysematous pyelitis: -continue PCN  drain -follow urine cultures to tailor antibiotics -pt not optimal surgical candidate and will need to discuss long term management -will need at least 2-3 week antibiotics  2. Left perinephric abscess:  -MRI shows slight decrease in size of absces -continue L JP drain and may need to repeat imaging to confirm improvement  3. Urinary retention:  -foley continues to have purulent drainage -continue flomax -continue foley    Kasandra Knudsen, MD Urology 01/27/2021, 8:13 AM

## 2021-01-27 NOTE — Consult Note (Addendum)
   [facility] Inpatient Consult   01/27/2021  Alfred Mercado August 11, 1936 [id number]   [facility] [[facility]]  Accountable Care Organization [ACO] Patient:  Medicare CMS DCE  Primary Care Provider:  Hoy Register, MD, [facility], this provider is listed for the [facility] follow up calls and appointments.  Patient screened for hospitalization with noted extreme high risk score for unplanned readmission risk and to assess for potential [facility]  [[facility]] Care Management service needs for post hospital transition.  Review of patient's medical record reveals patient is recommended for home health at transition. Patient was asleep on round, did not awaken.  Patient's residence is noted as [place] [address], [place] per inpatient TOC's review.  Plan:  [facility] liaison to continue to follow progress and disposition to assess for post hospital care management needs.    For questions contact:   Charlesetta Shanks, RN BSN CCM [facility]  [phone] business mobile phone Toll free office [phone]  Fax number: [phone] [email or web address]

## 2021-01-27 NOTE — Plan of Care (Signed)
  Problem: Education: Goal: Knowledge of General Education information will improve Description Including pain rating scale, medication(s)/side effects and non-pharmacologic comfort measures Outcome: Progressing   

## 2021-01-27 NOTE — Progress Notes (Signed)
Pharmacy Antibiotic Note  Alfred Mercado is a 85 y.o. male admitted on 01/24/2021 presenting with dizziness, abdominal swelling, concern for UTI with hx of resistant organisms to 3rd gen cephalosporins.  Pharmacy has been consulted for Cefepime dosing. Today is Day 4 of therapy.  Urine culture many species Renal abscess cultures NGTD  Plan: Continue Cefepime 2g IV q 24 hours Monitor renal function, Cx and clinical progression to narrow F/u length of therapy  Height: 6\' 1"  (185.4 cm) Weight: 81.9 kg (180 lb 8.9 oz) IBW/kg (Calculated) : 79.9  Temp (24hrs), Avg:98.3 F (36.8 C), Min:97.7 F (36.5 C), Max:98.7 F (37.1 C)  Recent Labs  Lab 01/24/21 1323 01/25/21 0222 01/26/21 0250 01/27/21 0359  WBC 9.7 9.5 9.6 8.8  CREATININE 2.11* 1.88* 1.63* 1.63*    Estimated Creatinine Clearance: 38.8 mL/min (A) (by C-G formula based on SCr of 1.63 mg/dL (H)).    No Known Allergies  My [place] A. 03/29/21, PharmD, BCPS, FNKF Clinical Pharmacist [place] Please utilize Amion for appropriate phone number to reach the unit pharmacist [facility])   01/27/2021 8:13 AM

## 2021-01-28 LAB — BASIC METABOLIC PANEL
Anion gap: 8 (ref 5–15)
BUN: 19 mg/dL (ref 8–23)
CO2: 20 mmol/L — ABNORMAL LOW (ref 22–32)
Calcium: 8.6 mg/dL — ABNORMAL LOW (ref 8.9–10.3)
Chloride: 108 mmol/L (ref 98–111)
Creatinine, Ser: 1.52 mg/dL — ABNORMAL HIGH (ref 0.61–1.24)
GFR, Estimated: 45 mL/min — ABNORMAL LOW (ref >60)
Glucose, Bld: 115 mg/dL — ABNORMAL HIGH (ref 70–99)
Potassium: 3.6 mmol/L (ref 3.5–5.1)
Sodium: 136 mmol/L (ref 135–145)

## 2021-01-28 LAB — CBC
HCT: 25.2 % — ABNORMAL LOW (ref 39.0–52.0)
Hemoglobin: 8.1 g/dL — ABNORMAL LOW (ref 13.0–17.0)
MCH: 25.2 pg — ABNORMAL LOW (ref 26.0–34.0)
MCHC: 32.1 g/dL (ref 30.0–36.0)
MCV: 78.5 fL — ABNORMAL LOW (ref 80.0–100.0)
Platelets: 258 10*3/uL (ref 150–400)
RBC: 3.21 MIL/uL — ABNORMAL LOW (ref 4.22–5.81)
RDW: 14.9 % (ref 11.5–15.5)
WBC: 8.7 10*3/uL (ref 4.0–10.5)
nRBC: 0 % (ref 0.0–0.2)

## 2021-01-28 MED ORDER — SODIUM CHLORIDE 0.9% FLUSH
5.0000 mL | Freq: Three times a day (TID) | INTRAVENOUS | Status: DC
  Administered 2021-01-28 – 2021-02-02 (×11): 5 mL

## 2021-01-28 MED ORDER — MELATONIN 3 MG PO TABS
3.0000 mg | ORAL_TABLET | Freq: Every day | ORAL | Status: DC
  Filled 2021-01-28: qty 1

## 2021-01-28 MED ORDER — MELATONIN 3 MG PO TABS
3.0000 mg | ORAL_TABLET | Freq: Every day | ORAL | Status: DC
  Administered 2021-01-28 – 2021-02-01 (×5): 3 mg via ORAL
  Filled 2021-01-28 (×4): qty 1

## 2021-01-28 NOTE — Progress Notes (Signed)
     Subjective: [name] is asleep when seen, awoke to voice, reports no pain.  He had no reported events overnight.    Objective:  Vital signs in last 24 hours: Vitals:   01/27/21 1841 01/27/21 2002 01/28/21 0432 01/28/21 0911  BP: 112/74 120/85 111/72 115/68  Pulse: 84 (!) 102 95 92  Resp: 18 16 16 18   Temp: 97.9 F (36.6 C) (!) 97.3 F (36.3 C) 97.8 F (36.6 C) 97.8 F (36.6 C)  TempSrc: Oral Oral Oral Oral  SpO2: 97% 98% 96% 98%  Weight:      Height:       Gen: Sleepy, but awake to voice Eyes: Anicteric sclerae, no injection CV: RR, NR, no murmur noted, no peripheral edema Pulm: Breathing comfortably, no wheezing Abd: Soft.  Percutaneous nephrostomy tube on the right with scant brownish urine present, JP drain on the left with small amount of blood with pus.   GU: He has darker urine today, more clear, no sediment, foley in place Skin: He has a continued variegated macule on face.   OT Plan: Home Health OT  IR plan for tubes: Right sided: Continue current management, continue until seen by OP urology  Left Sided: Repeat CT scan when output from tube < 10cc/day - possible removal  UC: 6/1 3am: Multiple species UC 6/1 1315: Multiple species Cx left abscess 6/1: Abundant WBC, no organisms grown to date [facility] 6/2: NGTD  Assessment/Plan:  Complicated urinary tract infection - Cultures noted above - no identified bacteria as of yet - Continue PCN drains - Continue cefepime, will need 2-3 weeks of therapy, as of yet, cannot narrow antibiotics given culture date.   - Sepsis resolved - Follow up urology recommendations - Foley in place, continue flomax - Dilaudid and oxycodone PRN for pain.   Acute-on-chronic kidney injury - Baseline 1.2 - 1.4 - Improved today to 1.52 with treatment of above - Continue to monitor - Encourage PO intake  Hypokalemia - Resolved today - Supplement as needed    Left renal mass - Will need follow MRI in 3 months outpatient     Right Staghorn renal calculus - PCN for management - Further management per Urology OP  Closed burst fracture of lumbar vertebra - Continue back brace - Plan for OP follow up with Neurosurgery  Iron deficiency anemia - Stable, monitor - IV iron given 01/27/21 - Follow up outpatient  Chronic HFpEF - He is on diltiazem, lasix, metoprolol - Diltiazem is on hold at this time   Atrial Fibrillation - He is on diltiazem, metoprolol, eliquis for this issue - Home does of eliquis decreased due to initial renal function, consider increase once renal function improves to baseline - Metoprolol is ordered, diltiazem is on hold, HR is in the 80s.   Dispo: Anticipated discharge in approximately 3-4 day(s).   03/29/21, MD 01/28/2021, 2:02 PM

## 2021-01-29 LAB — BASIC METABOLIC PANEL
Anion gap: 8 (ref 5–15)
BUN: 17 mg/dL (ref 8–23)
CO2: 22 mmol/L (ref 22–32)
Calcium: 8.6 mg/dL — ABNORMAL LOW (ref 8.9–10.3)
Chloride: 106 mmol/L (ref 98–111)
Creatinine, Ser: 1.45 mg/dL — ABNORMAL HIGH (ref 0.61–1.24)
GFR, Estimated: 48 mL/min — ABNORMAL LOW (ref >60)
Glucose, Bld: 113 mg/dL — ABNORMAL HIGH (ref 70–99)
Potassium: 3.2 mmol/L — ABNORMAL LOW (ref 3.5–5.1)
Sodium: 136 mmol/L (ref 135–145)

## 2021-01-29 LAB — CBC WITH DIFFERENTIAL/PLATELET
Abs Immature Granulocytes: 0.08 10*3/uL — ABNORMAL HIGH (ref 0.00–0.07)
Basophils Absolute: 0.1 10*3/uL (ref 0.0–0.1)
Basophils Relative: 1 %
Eosinophils Absolute: 0.1 10*3/uL (ref 0.0–0.5)
Eosinophils Relative: 2 %
HCT: 25.3 % — ABNORMAL LOW (ref 39.0–52.0)
Hemoglobin: 8 g/dL — ABNORMAL LOW (ref 13.0–17.0)
Immature Granulocytes: 1 %
Lymphocytes Relative: 12 %
Lymphs Abs: 0.8 10*3/uL (ref 0.7–4.0)
MCH: 25 pg — ABNORMAL LOW (ref 26.0–34.0)
MCHC: 31.6 g/dL (ref 30.0–36.0)
MCV: 79.1 fL — ABNORMAL LOW (ref 80.0–100.0)
Monocytes Absolute: 0.6 10*3/uL (ref 0.1–1.0)
Monocytes Relative: 8 %
Neutro Abs: 5.5 10*3/uL (ref 1.7–7.7)
Neutrophils Relative %: 76 %
Platelets: 290 10*3/uL (ref 150–400)
RBC: 3.2 MIL/uL — ABNORMAL LOW (ref 4.22–5.81)
RDW: 15.2 % (ref 11.5–15.5)
WBC: 7.2 10*3/uL (ref 4.0–10.5)
nRBC: 0 % (ref 0.0–0.2)

## 2021-01-29 MED ORDER — HYDROXYZINE HCL 10 MG PO TABS
10.0000 mg | ORAL_TABLET | Freq: Three times a day (TID) | ORAL | Status: DC | PRN
  Administered 2021-01-30 – 2021-02-02 (×2): 10 mg via ORAL
  Filled 2021-01-29 (×2): qty 1

## 2021-01-29 MED ORDER — POTASSIUM CHLORIDE 20 MEQ PO PACK
40.0000 meq | PACK | Freq: Once | ORAL | Status: AC
  Administered 2021-01-29: 40 meq via ORAL
  Filled 2021-01-29: qty 2

## 2021-01-29 NOTE — Progress Notes (Addendum)
Subjective: No acute events overnight.  [name] reports continued abdominal and back pain that is improving. He appears to have more energy and is more conversational. He complains of new pruritus of his back around his site of PCN tube. Otherwise, he is doing well.   Objective:  Vital signs in last 24 hours: Vitals:   01/28/21 0911 01/28/21 1700 01/28/21 2046 01/29/21 0523  BP: 115/68 120/74 114/76 91/75  Pulse: 92 87 (!) 106 94  Resp: 18 18 18 15   Temp: 97.8 F (36.6 C) 98.2 F (36.8 C) 98 F (36.7 C) 97.8 F (36.6 C)  TempSrc: Oral Oral Oral Oral  SpO2: 98% 99% 98% 95%  Weight:      Height:       Weight change:   Intake/Output Summary (Last 24 hours) at 01/29/2021 0755 Last data filed at 01/29/2021 0523 Gross per 24 hour  Intake 870 ml  Output 1455 ml  Net -585 ml   Physical Exam Constitutional:      Appearance: Normal appearance. He is not ill-appearing.  HENT:     Mouth/Throat:     Mouth: Mucous membranes are moist.  Eyes:     Conjunctiva/sclera: Conjunctivae normal.  Cardiovascular:     Rate and Rhythm: Normal rate and regular rhythm.     Heart sounds: No murmur heard.   Pulmonary:     Effort: Pulmonary effort is normal.     Breath sounds: Normal breath sounds.  Abdominal:     General: Abdomen is flat.     Palpations: Abdomen is soft.     Comments: Mild to moderate tenderness to palpation  Genitourinary:    Comments: Drains remain in place bilaterally  Musculoskeletal:     Right lower leg: No edema.     Left lower leg: No edema.  Skin:    General: Skin is warm and dry.     Comments: No lesion on back at site of pruritus  Variegated macule on left side of face  Neurological:     Mental Status: He is alert.    Assessment/Plan:  Principal Problem:   Complicated urinary tract infection Active Problems:   Hypokalemia   Acute-on-chronic kidney injury (HCC)   Left renal mass   Staghorn renal calculus   Microscopic hematuria   Aortic  aneurysm (HCC)   Closed burst fracture of lumbar vertebra (HCC)   Severe sepsis (HCC)   Iron deficiency anemia   Generalized weakness  This is hospital day #64forMr. Alfred Mercado,an33 year old man with history of HFpEF (06/2019 EF 50-55%)with associatedright pleural effusion s/p thoracentesis, atrial fibrillation on Eliquis, heavy tobacco use, COPD,andvitamin D deficiencywho presented withgeneralized weakness and recent falls.  Chronic conditions requiring follow-up outlined in hospital course.  #Pruritus, new problem Reports significant itching on back today. - Start atarax 10mg  TID PRN - Reassess tomorrow  #Complicated UTI withleftpyelonephritis abscess #Weakness  - Urology following - Cont JP bulb drainage - about 15cc today of pus/serosanginous material (goal of <10/day for removal)  - Consider CT scan tomorrow for drain removal  - Wound culture: awaiting reincubated results  - Cont Cefepime (day 6) and culture results for tailoring of therapy (2-3w of antibiotics required)  - Dilaudid and oxycodone PRN for pain - Encouraging ambulation, PT/OT, and out of bed today   #Right staghorn renal calculus with emphysematous features - Urology following -Cont foley for bladder decompression -Cont percutaneous nephrostomy tube (cont to drain sediment material) - ContFlomax 0.4mg  daily   #AKI on CKD IIIa, improving  Initiallypresented with an AKI on top of known CKD IIIa(baseline Cr 1.2-1.4) d/t likelypre-renal and obstructive etiology given dry/volume down on exam with known staghorn calculus and mass. -Cr 2.11 >> 1.45, BUN 24 >> 17, GFR 30 >> 48 - Improving close to known baseline Cr  - BMP tomorrow  #Hypokalemia, worsening - K 3.2 >> 3.2 - replacement today - BMP tomorrow  #Iron deficiency anemia, worsening He has a history of chronic anemia with a baseline of 10-11. Iron studies suggestive of iron deficiency anemia with low iron, low  saturation, and low-normal ferritin. Further supported by low MCV. - Hgb 9.8 >> 8.0, stable over last several days - s/p IV iron replacement  -CBC tomorrow  #HFpEF, stable #Right pleural effusion, stable - Cont metoprolol 25mg  BID and Lasix  - Cont to hold diltiazem as BP remains low/normal  #Atrial fibrillation, stable -Cont Eliquis 2.5mg  BID (consider increasing to home dose tomorrow if Cr remains <1.5) - Tachycardia improved with addition of home metoprolol  #COPD, stable #Tobacco use disorder  - Cont Spiriva and albuterol  - Cont nicotine patch   #L1 burst fracture with retropulsion, stable Several fallsprior to admission.CT and MRI completed. - Neurosurgery consulted: f/u X-rays in 1 month - PT/OT consulted: recommend home health    LOS: 5 days   , Medical Student 01/29/2021, 7:55 AM

## 2021-01-29 NOTE — Progress Notes (Signed)
Patient requested chaplain and the RN notified the chaplain, RN will continue to monitor this patient.

## 2021-01-29 NOTE — Progress Notes (Signed)
Patient requested assistance to the bedside commode, Patient refused RN assistance to the bedside commode stating "You are too small" I will wait for Greig Castilla. Greig Castilla is the Musician. RN notified Greig Castilla. RN will continue to monitor this patient.

## 2021-01-30 ENCOUNTER — Inpatient Hospital Stay (HOSPITAL_COMMUNITY): Payer: Medicare Other

## 2021-01-30 DIAGNOSIS — D509 Iron deficiency anemia, unspecified: Secondary | ICD-10-CM

## 2021-01-30 LAB — AEROBIC/ANAEROBIC CULTURE W GRAM STAIN (SURGICAL/DEEP WOUND)

## 2021-01-30 LAB — BASIC METABOLIC PANEL
Anion gap: 5 (ref 5–15)
BUN: 16 mg/dL (ref 8–23)
CO2: 22 mmol/L (ref 22–32)
Calcium: 8.6 mg/dL — ABNORMAL LOW (ref 8.9–10.3)
Chloride: 108 mmol/L (ref 98–111)
Creatinine, Ser: 1.4 mg/dL — ABNORMAL HIGH (ref 0.61–1.24)
GFR, Estimated: 50 mL/min — ABNORMAL LOW (ref >60)
Glucose, Bld: 94 mg/dL (ref 70–99)
Potassium: 3.5 mmol/L (ref 3.5–5.1)
Sodium: 135 mmol/L (ref 135–145)

## 2021-01-30 LAB — CULTURE, BLOOD (ROUTINE X 2): Culture: NO GROWTH

## 2021-01-30 LAB — CBC
HCT: 25.3 % — ABNORMAL LOW (ref 39.0–52.0)
Hemoglobin: 8 g/dL — ABNORMAL LOW (ref 13.0–17.0)
MCH: 25.2 pg — ABNORMAL LOW (ref 26.0–34.0)
MCHC: 31.6 g/dL (ref 30.0–36.0)
MCV: 79.6 fL — ABNORMAL LOW (ref 80.0–100.0)
Platelets: 279 10*3/uL (ref 150–400)
RBC: 3.18 MIL/uL — ABNORMAL LOW (ref 4.22–5.81)
RDW: 15.5 % (ref 11.5–15.5)
WBC: 6.5 10*3/uL (ref 4.0–10.5)
nRBC: 0 % (ref 0.0–0.2)

## 2021-01-30 MED ORDER — CARBAMIDE PEROXIDE 6.5 % OT SOLN
5.0000 [drp] | Freq: Two times a day (BID) | OTIC | Status: DC
  Administered 2021-01-30 – 2021-02-02 (×8): 5 [drp] via OTIC
  Filled 2021-01-30: qty 15

## 2021-01-30 MED ORDER — APIXABAN 5 MG PO TABS
5.0000 mg | ORAL_TABLET | Freq: Two times a day (BID) | ORAL | Status: DC
  Administered 2021-01-30 – 2021-02-02 (×7): 5 mg via ORAL
  Filled 2021-01-30 (×4): qty 1
  Filled 2021-01-30: qty 2
  Filled 2021-01-30 (×3): qty 1

## 2021-01-30 NOTE — Progress Notes (Signed)
Chart review:  CT Abd/Pelvis 01/30/21 IMPRESSION: 1. Significantly interval decreased size but persistence of left perinephric fluid collection with good position of indwelling pigtail drainage catheter. Unchanged nonobstructive right upper pole 1.8 cm renal calculus. 2. Similar appearing right staghorn renal calculus with interval decreased gaseous foci within the collecting system after placement of right percutaneous nephrostomy tube. 3. Unchanged comminuted L1 vertebral body burst fracture with approximately 4 mm of retropulsion and 50% height loss. 4. Slight interval increase small right trace left pleural effusions and associated bibasilar subsegmental atelectasis. 5. Similar appearing bilobed aneurysmal dilation versus multifocal penetrating atheromatous ulcers about the infrarenal abdominal aorta measuring up to 29 mm in maximum diameter. 6. Small fat and small bowel containing left inguinal hernia without complicating features.  Marliss Coots, MD  [facility]  [facility]   Electronically Signed   By: Marliss Coots MD   On: 01/30/2021 10:05    Urology recs: 1. Continue LEFT perinephric drain if continuing to have output; decrease in size of abscess on repeat imaging; 2-3 weeks of antibiotics based on cultures 2. Continue RIGHT PCN drain as gas is improved; patient is not good surgical candidate and will need to discuss continuing tube vs attempted removal likely as outpatient 3. Foley can be removed if purulent urine no longer present; continue flomax; bladder scan to ensure residuals are <200 in setting of upper tract infection

## 2021-01-30 NOTE — Progress Notes (Signed)
Physical Therapy Treatment Patient Details Name: Alfred Mercado MRN: [id number] DOB: 08/17/1936 Today's Date: 01/30/2021    History of Present Illness 85 year old man who presented with progressive weakness and falls for a few weeks. Hitting his back (lumbar burst fracture present on imaging), also remembers a fall where he may have lost consciousness and he had difficulty getting up. In ED found to have comminuted L1 burst fracture with retropulsion  Admitted 01/24/21 for treatment of Complicated urinary tract infection with possible pyelonephritic abscess, Left Complex cystic renal mass, Right Staghorn renal calculus with emphysematous features in the right renal collecting system.  PMH of HFpEF, Afib on eliquis, right pleural effusion s/p thoracentesis, tobacco use    PT Comments    Pt is severely limited in safe mobility today by 10/10 pain secondary to abdominal cramping. Pt noted to have swollen tight abdomen. Pt reports how independent he has always been and how he will prove that he can be safe getting around. Pt is ultimately min guard for coming to sit EoB however takes ~5 minutes to progress to that point. Once seated EoB pt has large belch and screams out in pain, and slowly get himself back to bed without assist. PT would continue to recommend HHPT however HH agencies have refused given his IV antibiotics. Pt will need maximal education and encouragement to discharge to SNF facility. PT will continue to follow acutely.     Follow Up Recommendations  Supervision - Intermittent;SNF     Equipment Recommendations  Other (comment) (Rollator)       Precautions / Restrictions Precautions Precautions: Fall Precaution Comments: 2-3 falls in last month, dizziness, Required Braces or Orthoses: Spinal Brace Spinal Brace: Thoracolumbosacral orthotic Spinal Brace Comments: has TLSO in room that was to be used for comfort with OOB, however now he has 2 drains coming out of his back making it  painful and difficulty to place brace Restrictions Weight Bearing Restrictions: No    Mobility  Bed Mobility Overal bed mobility: Needs Assistance Bed Mobility: Rolling;Sidelying to Sit Rolling: Min guard Sidelying to sit: Min guard;HOB elevated     Sit to sidelying: Min guard;HOB elevated General bed mobility comments: pt determined to get to the EoB, because he wants to go home, with maximal time and effort pt is able to get to the EoB with on min guard assist and vc for hand placement to assist in coming to upright. Pt only able to tolerate for about 1 minute before belching and complaining of increased abdominal pain. At which point he slowly brought his LE back into bed and rolled on his back    Transfers                 General transfer comment: unable today due to abdominal pain      Balance Overall balance assessment: Needs assistance Sitting-balance support: Feet supported;No upper extremity supported Sitting balance-[name]: Fair                                      Cognition Arousal/Alertness: Awake/alert Behavior During Therapy: WFL for tasks assessed/performed;Restless Overall Cognitive Status: Impaired/Different from baseline Area of Impairment: Safety/judgement;Problem solving                         Safety/Judgement: Decreased awareness of safety;Decreased awareness of deficits   Problem Solving: Slow processing General Comments: pt continues  to have poor awareness of how sick he is and how it is effecting his mobility         General Comments General comments (skin integrity, edema, etc.): [name] abdomen is distended and pt reports constant stomach cramping that is limiting his mobility. Despite decreased mobility pt continues to report how he is independent and only wants to go back home.      Pertinent Vitals/Pain Pain Assessment: Faces Faces Pain Scale: Hurts worst Pain Location: abdomnal cramps Pain Descriptors  / Indicators: Grimacing;Guarding;Discomfort Pain Intervention(s): Limited activity within patient's tolerance;Monitored during session;Repositioned           PT Goals (current goals can now be found in the care plan section) Acute Rehab PT Goals Patient Stated Goal: go back home PT Goal Formulation: With patient Time For Goal Achievement: 02/09/21 Potential to Achieve Goals: Fair Progress towards PT goals: Not progressing toward goals - comment (limited by abdominal pain)    Frequency    Min 3X/week      PT Plan Discharge plan needs to be updated       AM-PAC PT "6 Clicks" Mobility   Outcome Measure  Help needed turning from your back to your side while in a flat bed without using bedrails?: None Help needed moving from lying on your back to sitting on the side of a flat bed without using bedrails?: A Little Help needed moving to and from a bed to a chair (including a wheelchair)?: A Lot Help needed standing up from a chair using your arms (e.g., wheelchair or bedside chair)?: A Lot Help needed to walk in hospital room?: A Little Help needed climbing 3-5 steps with a railing? : Total 6 Click Score: 15    End of Session Equipment Utilized During Treatment: Gait belt Activity Tolerance: Patient tolerated treatment well Patient left: in chair;with call bell/phone within reach;with chair alarm set Nurse Communication: Mobility status;Other (comment) (pt would like his eye wash) PT Visit Diagnosis: Unsteadiness on feet (R26.81);Other abnormalities of gait and mobility (R26.89);Repeated falls (R29.6);Muscle weakness (generalized) (M62.81);History of falling (Z91.81);Difficulty in walking, not elsewhere classified (R26.2);Pain Pain - part of body:  (abdomen)     Time: 4431-5400 PT Time Calculation (min) (ACUTE ONLY): 25 min  Charges:  $Therapeutic Activity: 23-37 mins                     [name] B. Beverely Risen PT, DPT Acute Rehabilitation Services Pager [phone] Office [phone]    [name] 01/30/2021, 1:48 PM

## 2021-01-30 NOTE — Progress Notes (Signed)
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  He states the hearing loss is due to ear wax in both ears.  I advised patient that I would let the MD know and that day shift MD would probably assess in the morning.  Spoke with Dr. Laddie Aquas and made her aware.  Will continue to monitor patient.  Bernie Covey RN

## 2021-01-30 NOTE — Progress Notes (Signed)
Referring Physician(s): * No referring provider recorded for this case *  Supervising Physician: Gilmer Mor  Patient Status:  [facility] - In-pt  Chief Complaint: Pyelonephritis  Subjective: Resting in bed.   Reports changes in his pain control.  Asking what the ultimate plan is, states he would like to go back home at discharge.   Allergies: Patient has no known allergies.  Medications: Prior to Admission medications   Medication Sig Start Date End Date Taking? Authorizing Provider  acetaminophen (TYLENOL) 325 MG tablet Take 650 mg by mouth every 6 (six) hours as needed for mild pain or headache.   Yes [provider]  albuterol (VENTOLIN HFA) 108 (90 Base) MCG/ACT inhaler Inhale 2 puffs into the lungs every 6 (six) hours as needed for wheezing or shortness of breath. 11/30/20  Yes Hoy Register, MD  apixaban (ELIQUIS) 5 MG TABS tablet Take 1 tablet (5 mg total) by mouth 2 (two) times daily. 11/30/20  Yes Hoy Register, MD  bisacodyl (DULCOLAX) 5 MG EC tablet Take 20 mg by mouth daily as needed for moderate constipation.   Yes [provider]  diltiazem (CARDIZEM SR) 90 MG 12 hr capsule Take 1 capsule (90 mg total) by mouth 2 (two) times daily. 11/30/20  Yes Hoy Register, MD  furosemide (LASIX) 40 MG tablet Take 1 tablet (40 mg total) by mouth See admin instructions. Patient taking differently: Take 40 mg by mouth daily. 11/30/20  Yes Hoy Register, MD  metoprolol tartrate (LOPRESSOR) 25 MG tablet TAKE 1 TABLET(25 MG) BY MOUTH TWICE DAILY Patient taking differently: Take 25 mg by mouth 2 (two) times daily. 11/30/20  Yes Hoy Register, MD  Misc. Devices MISC Power wheelchair.  Diagnosis atrial fibrillation, gait abnormality 12/01/20  Yes Newlin, Odette Horns, MD  Tiotropium Bromide Monohydrate (SPIRIVA RESPIMAT) 2.5 MCG/ACT AERS INHALE 2 PUFFS BY MOUTH DAILY Patient taking differently: Inhale 2 puffs into the lungs daily. INHALE 2 PUFFS BY MOUTH DAILY 11/30/20  Yes Newlin,  Odette Horns, MD  Vitamin D, Ergocalciferol, (DRISDOL) 1.25 MG (50000 UNIT) CAPS capsule TAKE 1 CAPSULE BY MOUTH 1 TIME A WEEK Patient taking differently: Take 50,000 Units by mouth every 7 (seven) days. 12/01/20  Yes Hoy Register, MD  carbamide peroxide (DEBROX) 6.5 % OTIC solution Place 5 drops into the right ear 2 (two) times daily. Patient not taking: Reported on 01/24/2021 11/30/20   Hoy Register, MD  nicotine (NICODERM CQ - DOSED IN MG/24 HOURS) 21 mg/24hr patch Place 1 patch (21 mg total) onto the skin daily. Patient not taking: Reported on 01/24/2021 06/07/20   Hoy Register, MD  nicotine (NICODERM CQ - DOSED IN MG/24 HOURS) 21 mg/24hr patch PLACE 1 PATCH (21 MG TOTAL) ONTO THE SKIN DAILY. Patient not taking: Reported on 01/24/2021 06/06/20 06/06/21  Dolan Amen, MD  polyethylene glycol powder [employer]) 17 GM/SCOOP powder Take 17 g by mouth daily. Patient not taking: Reported on 01/24/2021 03/02/20   Hoy Register, MD     Vital Signs: BP (!) 110/58   Pulse 80   Temp (!) 97.5 F (36.4 C) (Oral)   Resp 18   Ht 6\' 1"  (1.854 m)   Wt 180 lb 8.9 oz (81.9 kg)   SpO2 99%   BMI 23.82 kg/m   Physical Exam  NAD, alert Abdomen: R PCN in place without issue.  L perinephritic drain in place. Small amount of serous fluid with bloody debris. Flushes and aspirates easily.   Imaging: CT ABDOMEN PELVIS WO CONTRAST  Result Date: 01/30/2021  CLINICAL DATA:  85 year old male with history of renal abscess. EXAM: CT ABDOMEN AND PELVIS WITHOUT CONTRAST TECHNIQUE: Multidetector CT imaging of the abdomen and pelvis was performed following the standard protocol without IV contrast. COMPARISON:  01/24/2021 FINDINGS: Lower chest: Similar appearing moderate global cardiomegaly. Similar appearing trace pericardial effusion. Severe coronary atherosclerotic calcifications. Slight interval increased volume of small right and trace left pleural effusions. Bibasilar subsegmental atelectasis, right greater  than left. Hepatobiliary: No acute focal liver abnormality is seen. Similar appearing segment 4 a simple cyst measuring up to approximately 2.4 cm. No gallstones, gallbladder wall thickening, or biliary dilatation. Pancreas: Diffuse fatty atrophy, unchanged. No pancreatic ductal dilatation or surrounding inflammatory changes. Spleen: Normal in size without focal abnormality. Adrenals/Urinary Tract: Adrenal glands are within normal limits bilaterally. Interval insertion of right midpole percutaneous nephrostomy tube. The pigtail portion is coiled in a mid pole major calyx. There are trace residual foci of gas within the right collecting system, and significantly decreased from comparison. Unchanged extensive right staghorn calculus. Minimal right perinephric fat stranding. Status post interval placement of a left perinephric pigtail drainage catheter which appears well positioned. Similar appearing left upper pole nonobstructive renal calculus measuring up to 1.8 cm. Interval decrease in previously visualized thick walled left posterior perinephric fluid collection which previously measured up to approximately 5.7 x 4.4 cm, now approximately 4.4 x 2.1 cm. Unchanged exophytic simple cyst about the left inferior pole. The bladder is decompressed with Foley in place. Stomach/Bowel: Stomach is within normal limits. Moderate colonic stool burden. No evidence of bowel wall thickening, distention, or inflammatory changes. Vascular/Lymphatic: Abdominal aortic atherosclerotic calcifications. Similar-appearing bilobed aneurysmal dilation versus multifocal left infrarenal abdominal aortic penetrating atheromatous ulcers (evaluation limited due to lack of intravenous contrast), with maximum cross-sectional aortic diameter measuring up to 29 mm, unchanged. No abdominopelvic lymphadenopathy. Reproductive: Coarse calcifications within the normal size prostate gland. Other: Small fat and small bowel containing left inguinal hernia  without evidence of bowel obstruction or strangulation. No ascites. Musculoskeletal: Similar appearing comminuted fracture of the L1 vertebral body, unchanged 50% height loss and approximately 4 mm of retropulsion. Similar appearing multilevel degenerative changes of the visualized thoracolumbar spine. IMPRESSION: 1. Significantly interval decreased size but persistence of left perinephric fluid collection with good position of indwelling pigtail drainage catheter. Unchanged nonobstructive right upper pole 1.8 cm renal calculus. 2. Similar appearing right staghorn renal calculus with interval decreased gaseous foci within the collecting system after placement of right percutaneous nephrostomy tube. 3. Unchanged comminuted L1 vertebral body burst fracture with approximately 4 mm of retropulsion and 50% height loss. 4. Slight interval increase small right trace left pleural effusions and associated bibasilar subsegmental atelectasis. 5. Similar appearing bilobed aneurysmal dilation versus multifocal penetrating atheromatous ulcers about the infrarenal abdominal aorta measuring up to 29 mm in maximum diameter. 6. Small fat and small bowel containing left inguinal hernia without complicating features. Marliss Coots, MD [facility] Electronically Signed   By: Marliss Coots MD   On: 01/30/2021 10:05    Labs:  CBC: Recent Labs    01/27/21 0359 01/28/21 0727 01/29/21 0236 01/30/21 0625  WBC 8.8 8.7 7.2 6.5  HGB 7.8* 8.1* 8.0* 8.0*  HCT 24.0* 25.2* 25.3* 25.3*  PLT 259 258 290 279    COAGS: Recent Labs    01/25/21 1013  INR 1.6*    BMP: Recent Labs    02/06/20 0850 02/07/20 0919 02/08/20 7121 02/09/20 9758 06/02/20 8325 01/27/21 4982 01/28/21 6415 01/29/21 0236  01/30/21 0625  NA 140 139 139 139   < > 135 136 136 135  K 3.7 3.6 3.3* 3.6   < > 3.2* 3.6 3.2* 3.5  CL 102 100 101 101   < > 105 108 106 108  CO2 28 29 29 29    < > 21*  20* 22 22  GLUCOSE 129* 175* 114* 123*   < > 109* 115* 113* 94  BUN 14 13 16 18    < > 20 19 17 16   CALCIUM 8.8* 9.1 8.9 8.9   < > 8.7* 8.6* 8.6* 8.6*  CREATININE 1.18 1.27* 1.22 1.22   < > 1.63* 1.52* 1.45* 1.40*  GFRNONAA 57* 52* 55* 55*   < > 42* 45* 48* 50*  GFRAA >60 >60 >60 >60  --   --   --   --   --    < > = values in this interval not displayed.    LIVER FUNCTION TESTS: Recent Labs    02/06/20 1308 06/03/20 0331 01/24/21 2012  BILITOT 1.1 1.5* 0.6  AST 19 14* 14*  ALT 18 12 13   ALKPHOS 76 54 63  PROT 6.1* 5.2* 6.3*  ALBUMIN 3.4* 2.6* 2.8*    Assessment and Plan: Pyelonephritis with abscess s/p left perinephritic drain placement 01/25/21 Right renal calculi s/p right PCN placement 01/25/21 Patient stable on assessment today.  Reports increased pain-- new pain management today per his report.  CT Abdomen Pelvis shows resolving left abscess with some residual fluid.  L drain in good position.  Drain evaluated at bedside today.  Small amount of serous appearing output with bloody debris.  Flushes well.  R PCN in place.  Continue current interventions per Urology.  IR following.   Electronically Signed: 01/26/21, PA 01/30/2021, 4:28 PM   I spent a total of 15 Minutes at the the patient's bedside AND on the patient's hospital floor or unit, greater than 50% of which was counseling/coordinating care for right renal calculi, pyelonephritis.

## 2021-01-30 NOTE — Progress Notes (Addendum)
Subjective: Overnight, complained of hearing issues with wax buildup as well as a headache/anxiety that self-resolve.  Alfred Mercado is doing well today. He reports continued mild to moderate abdominal and back pain. His headache overnight has resolved. He has a history of wax buildup on his R ear. He was started on drops overnight to help with this.   Objective:  Vital signs in last 24 hours: Vitals:   01/29/21 1033 01/29/21 2059 01/30/21 0525 01/30/21 1057  BP: 107/66 114/81 116/71 (!) 110/58  Pulse: (!) 58 98 89 80  Resp:  18 18 18   Temp: 97.6 F (36.4 C) 97.9 F (36.6 C) 98.3 F (36.8 C) (!) 97.5 F (36.4 C)  TempSrc: Oral Oral Oral Oral  SpO2: 98% 98% 98% 99%  Weight:      Height:       Weight change:   Intake/Output Summary (Last 24 hours) at 01/30/2021 0834 Last data filed at 01/30/2021 0600 Gross per 24 hour  Intake 1855 ml  Output 1380 ml  Net 475 ml   Physical Exam Constitutional:      Comments: Elderly male resting comfortably in bed, no acute distress  HENT:     Right Ear: Ear canal normal. Decreased hearing noted. There is impacted cerumen.     Left Ear: Ear canal normal. Decreased hearing noted. There is no impacted cerumen.     Mouth/Throat:     Mouth: Mucous membranes are moist.  Eyes:     Conjunctiva/sclera: Conjunctivae normal.  Cardiovascular:     Rate and Rhythm: Normal rate and regular rhythm.  Pulmonary:     Effort: Pulmonary effort is normal. No respiratory distress.     Comments: Anterior lung sounds normal Abdominal:     General: Abdomen is flat.     Palpations: Abdomen is soft.     Comments: Mild tenderness to palpation  Genitourinary:    Comments: Foley in place Drain on L in place with mild pus and serosanguinous fluid Drain on R in place with less sediment, more dark urine Musculoskeletal:     Right lower leg: No edema.     Left lower leg: No edema.  Skin:    General: Skin is warm and dry.    Assessment/Plan:  Principal  Problem:   Complicated urinary tract infection Active Problems:   Hypokalemia   Acute-on-chronic kidney injury (HCC)   Left renal mass   Staghorn renal calculus   Microscopic hematuria   Aortic aneurysm (HCC)   Closed burst fracture of lumbar vertebra (HCC)   Severe sepsis (HCC)   Iron deficiency anemia   Generalized weakness  This is hospital day #19forMr. Alfred Mercado,an37 year old man with history of HFpEF (06/2019 EF 50-55%)with associatedright pleural effusion s/p thoracentesis, atrial fibrillation on Eliquis, heavy tobacco use, COPD,andvitamin D deficiencywho presented withgeneralized weakness and recent falls.  Chronic conditions requiring follow-up outlined in hospital course.  PT recommending SNF placement at this time, switching from initial recommendation of Home Health, given his weakness.  #Pruritus, improving Reported back itching yesterday that has was amendable to Atarax but continuing to bother him. Instructed him to ask for atarax more often.  - Cont atarax 10mg  TID PRN  #Anxiety, new problem Anxiety overnight that was still present during rounds.  - Atarax 10mg  TID PRN  #Hearing loss with associated cerumen buildup Impacted cerumen on right side. Associated hearing loss. - Cont Debrox drops  #Complicated UTI withleftpyelonephritis abscess #Weakness  - Urology and VIRfollowing-- awaiting recommendations from VIR 07/2019) for  removal of L drain in light of new CT findings  - ContJP bulb drainage - CT scan today: significant decrease in size of abscess  - Wound culture: awaiting reincubated results  - Cont Cefepime (day 7) (2-3w of antibiotics required)  - Dilaudid and oxycodone PRN for pain  #Right staghorn renal calculus with emphysematous features - Urologyfollowing -- unable to contact physician who saw him today, will attempt tomorrow for recommendation on length of foley placement  -Cont foley for bladder decompression -Cont  percutaneous nephrostomy tube (cont to drain sediment material) -ContFlomax 0.4mg  daily   #AKI on CKD IIIa, improved Initiallypresented with an AKI on top of known CKD IIIa(baseline Cr 1.2-1.4) d/t likelypre-renal and obstructive etiology given dry/volume down on exam with known staghorn calculus and mass. -Cr 2.11 >> 1.40, BUN 24 >> 16, GFR 30 >>50 - BMP tomorrow  #Hypokalemia, resolving  - K 3.2 >> 3.5 today  - BMP tomorrow  #Iron deficiency anemia, stable History of chronic anemia with a baseline Hgb of 10-11. Iron studies suggestive of iron deficiency anemia with low iron, low saturation, and low-normal ferritin. Further supported by low MCV. - Hgb 9.8 >> 8.0, stable over last several days at 7.8-8.1 - s/p IV iron replacement  -CBC tomorrow  #HFpEF, stable #Right pleural effusion, stable -Contmetoprolol 25mg  BID and Lasix  - Cont to hold diltiazem but consider restarting soon as BP begins to normalize/rise   #Atrial fibrillation, stable -Cont Eliquis 2.5mg  BID (consider increasing to home dose tomorrow if Cr remains <1.5) -HR <100bpm  #COPD, stable #Tobacco use disorder  - Cont Spiriva and albuterol  - Cont nicotine patch   #L1 burst fracture with retropulsion, stable Several fallsprior to admission.CT and MRI completed. - Neurosurgery consulted: f/u X-rays in 1 month - PT/OT following    LOS: 6 days   , Medical Student 01/30/2021, 8:34 AM

## 2021-01-30 NOTE — TOC Progression Note (Addendum)
Transition of Care [facility]) - Progression Note    Patient Details  Name: Alfred Mercado MRN: [id number] Date of Birth: [birth year]  Transition of Care [facility] CM/SW Contact  Huston Foley Jacklynn Ganong, RN Phone Number: 01/30/2021, 12:03 PM  Clinical Narrative:   Case manager has contacted Lorenza Chick, [facility] Liaison concerning patient's discharge needs. Waiting for return call. The barrier is the living situation of patient.  1:38pm [facility] will not accept patient, He will need to go to SNF for IV antibiotics and shortterm rehab.   Expected Discharge Plan: Home w Home Health Services    Expected Discharge Plan and Services Expected Discharge Plan: Home w Home Health Services       Living arrangements for the past 2 months: No permanent address,Hotel/Motel (Lives [facility])                                       Social Determinants of Health (SDOH) Interventions    Readmission Risk Interventions No flowsheet data found.

## 2021-01-31 LAB — BASIC METABOLIC PANEL
Anion gap: 7 (ref 5–15)
BUN: 20 mg/dL (ref 8–23)
CO2: 24 mmol/L (ref 22–32)
Calcium: 8.7 mg/dL — ABNORMAL LOW (ref 8.9–10.3)
Chloride: 105 mmol/L (ref 98–111)
Creatinine, Ser: 1.43 mg/dL — ABNORMAL HIGH (ref 0.61–1.24)
GFR, Estimated: 49 mL/min — ABNORMAL LOW (ref >60)
Glucose, Bld: 96 mg/dL (ref 70–99)
Potassium: 3.6 mmol/L (ref 3.5–5.1)
Sodium: 136 mmol/L (ref 135–145)

## 2021-01-31 LAB — CBC
HCT: 28.1 % — ABNORMAL LOW (ref 39.0–52.0)
Hemoglobin: 8.7 g/dL — ABNORMAL LOW (ref 13.0–17.0)
MCH: 25.1 pg — ABNORMAL LOW (ref 26.0–34.0)
MCHC: 31 g/dL (ref 30.0–36.0)
MCV: 81 fL (ref 80.0–100.0)
Platelets: 293 10*3/uL (ref 150–400)
RBC: 3.47 MIL/uL — ABNORMAL LOW (ref 4.22–5.81)
RDW: 15.5 % (ref 11.5–15.5)
WBC: 7.4 10*3/uL (ref 4.0–10.5)
nRBC: 0 % (ref 0.0–0.2)

## 2021-01-31 LAB — CULTURE, BLOOD (ROUTINE X 2)
Culture: NO GROWTH
Special Requests: ADEQUATE

## 2021-01-31 MED ORDER — SULFAMETHOXAZOLE-TRIMETHOPRIM 800-160 MG PO TABS
1.0000 | ORAL_TABLET | Freq: Two times a day (BID) | ORAL | Status: DC
  Administered 2021-01-31 – 2021-02-02 (×5): 1 via ORAL
  Filled 2021-01-31 (×6): qty 1

## 2021-01-31 MED ORDER — BISACODYL 5 MG PO TBEC
20.0000 mg | DELAYED_RELEASE_TABLET | Freq: Every day | ORAL | Status: DC
  Administered 2021-01-31 – 2021-02-02 (×3): 20 mg via ORAL
  Filled 2021-01-31 (×3): qty 4

## 2021-01-31 MED ORDER — POLYETHYLENE GLYCOL 3350 17 G PO PACK
17.0000 g | PACK | Freq: Every day | ORAL | Status: DC
  Administered 2021-01-31 – 2021-02-02 (×3): 17 g via ORAL
  Filled 2021-01-31 (×3): qty 1

## 2021-01-31 NOTE — Progress Notes (Signed)
Occupational Therapy Treatment Patient Details Name: Alfred Mercado MRN: [id number] DOB: [birth year] Today's Date: 01/31/2021    History of present illness 85 year old man who presented with progressive weakness and falls for a few weeks. Hitting his back (lumbar burst fracture present on imaging), also remembers a fall where he may have lost consciousness and he had difficulty getting up. In ED found to have comminuted L1 burst fracture with retropulsion  Admitted 01/24/21 for treatment of Complicated urinary tract infection with possible pyelonephritic abscess, Left Complex cystic renal mass, Right Staghorn renal calculus with emphysematous features in the right renal collecting system.  PMH of HFpEF, Afib on eliquis, right pleural effusion s/p thoracentesis, tobacco use   OT comments  Patient progressing toward goals. Able to come to sitting at EOB with Min guard and increased time. Patient also completed stand-pivot to recliner on R with Min A and use of RW. Patient expressed displeasure about recommendation for SNF rehab. OT provided education on reason for recommendation. Patient does not appear to be receptive at this time. Would benefit from continued education. Patient reports visit from "strange woman" but unable to recall who she was. Upon review of med chart, this OT noted visit from social worker who provided detailed information on transition to SNF rehab. Patient demonstrating poor STM in addition to poor awareness of current deficits and poor safety awareness. Continued recommendation for SNF rehab given patient CLOF, poor family support and deficits.    Follow Up Recommendations  SNF    Equipment Recommendations  Other (comment) (Defer to next level of care.)    Recommendations for Other Services      Precautions / Restrictions Precautions Precautions: Fall Precaution Comments: 2-3 falls in last month, dizziness, Required Braces or Orthoses: Spinal Brace Spinal Brace:  Thoracolumbosacral orthotic Spinal Brace Comments: has TLSO in room that was to be used for comfort with OOB, however now he has 2 drains coming out of his back making it painful and difficulty to place brace Restrictions Weight Bearing Restrictions: No       Mobility Bed Mobility Overal bed mobility: Needs Assistance Bed Mobility: Rolling;Sidelying to Sit Rolling: Min guard Sidelying to sit: Min guard;HOB elevated       General bed mobility comments: Min guard for rolling to R and for sidelying to sit with HOB elevated. Patient annoyed with therapists cues for hand placement.    Transfers Overall transfer level: Needs assistance Equipment used: Rolling walker (2 wheeled) Transfers: Sit to/from [employer] Sit to Stand: Min assist Stand pivot transfers: Min assist       General transfer comment: Min A for sit to stand from elevated EOB and light Min A for stand-pivot to recliner.    Balance Overall balance assessment: Needs assistance Sitting-balance support: Feet supported;No upper extremity supported Sitting balance-[name]: Fair Sitting balance - Comments: Static sitting balance at EOB for 15 min with bilateral UE support on bed surface.   Standing balance support: Bilateral upper extremity supported;During functional activity Standing balance-[name]: Poor Standing balance comment: Reliant on at least unilateral UE support to maintain static standing balance.                           ADL either performed or assessed with clinical judgement   ADL Overall ADL's : Needs assistance/impaired                     Lower Body Dressing: Maximal  assistance Lower Body Dressing Details (indicate cue type and reason): Max A to don footwear seated EOB. Toilet Transfer: Minimal Production designer, theatre/television/film Details (indicate cue type and reason): Simulated with transfer to recliner with Min A and use of RW. Requires more than increased  time.                 Vision       Perception     Praxis      Cognition Arousal/Alertness: Awake/alert Behavior During Therapy: WFL for tasks assessed/performed;Restless Overall Cognitive Status: Impaired/Different from baseline Area of Impairment: Safety/judgement;Problem solving;Memory                     Memory: Decreased recall of precautions;Decreased short-term memory   Safety/Judgement: Decreased awareness of safety;Decreased awareness of deficits   Problem Solving: Slow processing General Comments: Poor awareness of current deficits and for need for external assist. Decreased STM.        Exercises     Shoulder Instructions       General Comments      Pertinent Vitals/ Pain       Pain Assessment: Faces Faces Pain Scale: Hurts even more Pain Location: abdomnal cramps Pain Descriptors / Indicators: Grimacing;Guarding;Discomfort Pain Intervention(s): Limited activity within patient's tolerance;Monitored during session;Repositioned  Home Living                                          Prior Functioning/Environment              Frequency  Min 2X/week        Progress Toward Goals  OT Goals(current goals can now be found in the care plan section)  Progress towards OT goals: Progressing toward goals  Acute Rehab OT Goals Patient Stated Goal: go back home OT Goal Formulation: With patient Time For Goal Achievement: 02/09/21 Potential to Achieve Goals: Good ADL Goals Pt Will Perform Grooming: with min guard assist;with supervision;with set-up;standing Pt Will Perform Upper Body Bathing: with supervision;with set-up;sitting Pt Will Perform Lower Body Bathing: with min assist;with adaptive equipment Pt Will Perform Upper Body Dressing: with supervision;with set-up;sitting Pt Will Perform Lower Body Dressing: with min assist;with adaptive equipment Pt Will Transfer to Toilet: with mod assist;with min  assist;ambulating;stand pivot transfer Pt Will Perform Toileting - Clothing Manipulation and hygiene: with min assist;with min guard assist;sit to/from stand Pt Will Perform Tub/Shower Transfer: with mod assist;with min assist;ambulating;shower seat;3 in 1;grab bars;rolling walker  Plan Discharge plan remains appropriate;Frequency remains appropriate    Co-evaluation                 AM-PAC OT "6 Clicks" Daily Activity     Outcome Measure   Help from another person eating meals?: None Help from another person taking care of personal grooming?: A Little Help from another person toileting, which includes using toliet, bedpan, or urinal?: A Lot Help from another person bathing (including washing, rinsing, drying)?: A Lot Help from another person to put on and taking off regular upper body clothing?: A Little Help from another person to put on and taking off regular lower body clothing?: A Lot 6 Click Score: 16    End of Session Equipment Utilized During Treatment: Gait belt;Back brace  OT Visit Diagnosis: Unsteadiness on feet (R26.81);Other abnormalities of gait and mobility (R26.89);Muscle weakness (generalized) (M62.81);History of falling (Z91.81);Pain;Other symptoms and signs involving cognitive function  Pain - part of body:  (Abdomen)   Activity Tolerance Patient tolerated treatment well   Patient Left in chair;with call bell/phone within reach;with chair alarm set   Nurse Communication Mobility status        Time: 1346-1430 OT Time Calculation (min): 44 min  Charges: OT General Charges $OT Visit: 1 Visit OT Treatments $Therapeutic Activity: 38-52 mins  Kaj Vasil H. OTR/L Supplemental OT, Department of rehab services [phone]   Taariq Leitz R H. 01/31/2021, 2:45 PM

## 2021-01-31 NOTE — Progress Notes (Signed)
Subjective: No acute events overnight.  He is doing well today. No major new complaints. He is open to potential SNF placement given his weakness that has worsened during this hospitalization.   Objective:  Vital signs in last 24 hours: Vitals:   01/30/21 2047 01/30/21 2049 01/31/21 0500 01/31/21 1033  BP: 109/70 105/68 94/66 (!) 99/51  Pulse: (!) 110 (!) 103 98 80  Resp: 14 14 14 18   Temp:  97.9 F (36.6 C) 97.7 F (36.5 C) 98.1 F (36.7 C)  TempSrc:  Oral Oral   SpO2: 100% 99% 96% 100%  Weight:      Height:       Weight change:   Intake/Output Summary (Last 24 hours) at 01/31/2021 1039 Last data filed at 01/31/2021 0900 Gross per 24 hour  Intake 886.63 ml  Output --  Net 886.63 ml   Physical Exam Constitutional:      Comments: Man resting in bed, conversational, alert, NAD  Eyes:     Conjunctiva/sclera: Conjunctivae normal.  Cardiovascular:     Rate and Rhythm: Normal rate and regular rhythm.     Heart sounds: No murmur heard.   Pulmonary:     Effort: Pulmonary effort is normal. No respiratory distress.     Breath sounds: Normal breath sounds.  Abdominal:     General: Abdomen is flat.     Tenderness: There is no guarding or rebound.     Comments: Mild distention and tenderness, particularly in LLQ  Genitourinary:    Comments: Bilateral drains in place, L with serosanguinous fluid, R with dark urine Skin:    General: Skin is warm and dry.     Comments: Lesion present on left cheek, unchanged    Assessment/Plan:  Principal Problem:   Complicated urinary tract infection Active Problems:   Hypokalemia   Acute-on-chronic kidney injury (HCC)   Renal mass, left   Staghorn renal calculus   Microscopic hematuria   Aortic aneurysm (HCC)   Closed burst fracture of lumbar vertebra (HCC)   Severe sepsis (HCC)   Iron deficiency anemia   Generalized weakness  This is hospital day #3forMr. Jkai Jamerson,an29 year old man with history of HFpEF (06/2019 EF  50-55%)with associatedright pleural effusion s/p thoracentesis, atrial fibrillation on Eliquis, heavy tobacco use, COPD,andvitamin D deficiencywho presented withgeneralized weakness and recent falls.  Chronic conditions requiring follow-up outlined in hospital course.  PT recommending SNF placement at this time. LCSW aware and in contact with him about options. Home Health unable to assist given his living situation 07/2019).  #Constipation  One BM last night but overall minimal output with some LLQ tenderness. - Changed Bisacodyl and Miralax to scheduled daily from PRN - Consider enema tomorrow if no relief   #Complicated UTI withleftpyelonephritis abscess, improving #Weakness - Urology and VIRfollowing - Per Urology, contJP bulb drainage until no drainage present - CT scan with interval decrease in size and culture grew Enterobacter  - Start Bactrim 800-160mg  BID (day 1/14) - Dilaudid and oxycodone PRN for pain  #Right staghorn renal calculus with emphysematous features, improving - Urologyfollowing  -Foley removal with void trial today - Bladder scans with post-void residual goal of <249mL  -Cont percutaneous nephrostomy tube with reassessment in outpatient setting  -ContFlomax 0.4mg  daily   #CKD IIIa, stable Initiallypresented with an AKI on top of known CKD IIIa(baseline Cr 1.2-1.4). He is now back to his baseline Cr for several days now.  -Cr 2.11 >> 1.43, BUN 24 >>20, GFR 30 >>49 - BMP tomorrow  #  Iron deficiency anemia, stable Baseline Hgb 10-11. - Hgb 8.7 today -s/p IV iron replacement -CBC tomorrow  #HFpEF, stable #Right pleural effusion, stable -Contmetoprolol 25mg  BIDand Lasix  - Cont to hold diltiazem with soft BP  #Atrial fibrillation, stable -Cont Eliquis 5mg  BID -HR consistently <100bpm, improved since restarting Metoprolol  #COPD, stable #Tobacco use disorder, stable - Cont Spiriva and albuterol  - Cont nicotine  patch    LOS: 7 days   , Medical Student 01/31/2021, 10:10 AM

## 2021-01-31 NOTE — TOC Progression Note (Signed)
Transition of Care [place] [facility]) - Progression Note    Patient Details  Name: Alfred Mercado MRN: [id number] Date of Birth: 11/01/1935  [facility]) CM/SW Contact  Okey Dupre Lazaro Arms, LCSW Phone Number: 01/31/2021, 1:43 PM  Clinical Narrative:   CSW talked with patient at bedside regarding his discharge disposition and the recommendation of ST rehab. [name] was lying in bed and was awake and alert. CSW spoke very loudly so that patient could hear and understand. Patient was informed about the recommendation of ST rehab at a skilled nursing facility. This was explained to patient and he appeared to understand. CSW made sure patient understood that this would be temporary and he would return to the motel. When asked, [name] reported that he has lived at the motel for 15 years, since 03/10/08. He added that he pays a special rate to stay there. When asked, patient responded that he does have a microwave and refrigerator and he does not receive food stamps.  [name] was asked about any family and responded that he did not want to discuss it.  CSW reinforced information regarding ST rehab and that he would be there 20 days or less (has regular Medicare) and then return to his room at the motel.   Patient was provided a Medicare.gov list and CSW informed patient that he would be given the names of the places that can take him and he will chose the facility he wants to go to for ST rehab.   Expected Discharge Plan: Home w Home Health Services    Expected Discharge Plan and Services Expected Discharge Plan: Home w Home Health Services       Living arrangements for the past 2 months: No permanent address,Hotel/Motel (Lives [facility])                                     Social Determinants of Health (SDOH) Interventions  No SDOH interventions requested or needed at this time.  Readmission Risk Interventions No flowsheet data found.

## 2021-01-31 NOTE — Progress Notes (Signed)
Referring Physician(s): Pace,M  Supervising Physician: Oley Balm  Patient Status:  [facility] - In-pt  Chief Complaint:  Back pain/renal stones, pyelonephritis/abscess  Subjective: Pt still with intermittent back pain, worse with movement; BP on soft side, foley removed this am   Allergies: Patient has no known allergies.  Medications: Prior to Admission medications   Medication Sig Start Date End Date Taking? Authorizing Provider  acetaminophen (TYLENOL) 325 MG tablet Take 650 mg by mouth every 6 (six) hours as needed for mild pain or headache.   Yes [provider]  albuterol (VENTOLIN HFA) 108 (90 Base) MCG/ACT inhaler Inhale 2 puffs into the lungs every 6 (six) hours as needed for wheezing or shortness of breath. 11/30/20  Yes Hoy Register, MD  apixaban (ELIQUIS) 5 MG TABS tablet Take 1 tablet (5 mg total) by mouth 2 (two) times daily. 11/30/20  Yes Hoy Register, MD  bisacodyl (DULCOLAX) 5 MG EC tablet Take 20 mg by mouth daily as needed for moderate constipation.   Yes [provider]  diltiazem (CARDIZEM SR) 90 MG 12 hr capsule Take 1 capsule (90 mg total) by mouth 2 (two) times daily. 11/30/20  Yes Hoy Register, MD  furosemide (LASIX) 40 MG tablet Take 1 tablet (40 mg total) by mouth See admin instructions. Patient taking differently: Take 40 mg by mouth daily. 11/30/20  Yes Hoy Register, MD  metoprolol tartrate (LOPRESSOR) 25 MG tablet TAKE 1 TABLET(25 MG) BY MOUTH TWICE DAILY Patient taking differently: Take 25 mg by mouth 2 (two) times daily. 11/30/20  Yes Hoy Register, MD  Misc. Devices MISC Power wheelchair.  Diagnosis atrial fibrillation, gait abnormality 12/01/20  Yes Newlin, Odette Horns, MD  Tiotropium Bromide Monohydrate (SPIRIVA RESPIMAT) 2.5 MCG/ACT AERS INHALE 2 PUFFS BY MOUTH DAILY Patient taking differently: Inhale 2 puffs into the lungs daily. INHALE 2 PUFFS BY MOUTH DAILY 11/30/20  Yes Newlin, Odette Horns, MD  Vitamin D, Ergocalciferol,  (DRISDOL) 1.25 MG (50000 UNIT) CAPS capsule TAKE 1 CAPSULE BY MOUTH 1 TIME A WEEK Patient taking differently: Take 50,000 Units by mouth every 7 (seven) days. 12/01/20  Yes Hoy Register, MD  carbamide peroxide (DEBROX) 6.5 % OTIC solution Place 5 drops into the right ear 2 (two) times daily. Patient not taking: Reported on 01/24/2021 11/30/20   Hoy Register, MD  nicotine (NICODERM CQ - DOSED IN MG/24 HOURS) 21 mg/24hr patch Place 1 patch (21 mg total) onto the skin daily. Patient not taking: Reported on 01/24/2021 06/07/20   Hoy Register, MD  nicotine (NICODERM CQ - DOSED IN MG/24 HOURS) 21 mg/24hr patch PLACE 1 PATCH (21 MG TOTAL) ONTO THE SKIN DAILY. Patient not taking: Reported on 01/24/2021 06/06/20 06/06/21  Dolan Amen, MD  polyethylene glycol powder [facility]) 17 GM/SCOOP powder Take 17 g by mouth daily. Patient not taking: Reported on 01/24/2021 03/02/20   Hoy Register, MD     Vital Signs: BP (!) 99/51 (BP Location: Left Arm)   Pulse 80   Temp 98.1 F (36.7 C)   Resp 18   Ht 6\' 1"  (1.854 m)   Wt 180 lb 8.9 oz (81.9 kg)   SpO2 100%   BMI 23.82 kg/m   Physical Exam right PCN, left perinephric drain intact, dressings dry, sites mild- mod tender; turbid, beige colored fluid in rt PCN bag, blood-tinged fluid in JP bulb of left perinephric drain, both drains flushed without difficulty  Imaging: CT ABDOMEN PELVIS WO CONTRAST  Result Date: 01/30/2021 CLINICAL DATA:  85 year old male with history of  renal abscess. EXAM: CT ABDOMEN AND PELVIS WITHOUT CONTRAST TECHNIQUE: Multidetector CT imaging of the abdomen and pelvis was performed following the standard protocol without IV contrast. COMPARISON:  01/24/2021 FINDINGS: Lower chest: Similar appearing moderate global cardiomegaly. Similar appearing trace pericardial effusion. Severe coronary atherosclerotic calcifications. Slight interval increased volume of small right and trace left pleural effusions. Bibasilar subsegmental  atelectasis, right greater than left. Hepatobiliary: No acute focal liver abnormality is seen. Similar appearing segment 4 a simple cyst measuring up to approximately 2.4 cm. No gallstones, gallbladder wall thickening, or biliary dilatation. Pancreas: Diffuse fatty atrophy, unchanged. No pancreatic ductal dilatation or surrounding inflammatory changes. Spleen: Normal in size without focal abnormality. Adrenals/Urinary Tract: Adrenal glands are within normal limits bilaterally. Interval insertion of right midpole percutaneous nephrostomy tube. The pigtail portion is coiled in a mid pole major calyx. There are trace residual foci of gas within the right collecting system, and significantly decreased from comparison. Unchanged extensive right staghorn calculus. Minimal right perinephric fat stranding. Status post interval placement of a left perinephric pigtail drainage catheter which appears well positioned. Similar appearing left upper pole nonobstructive renal calculus measuring up to 1.8 cm. Interval decrease in previously visualized thick walled left posterior perinephric fluid collection which previously measured up to approximately 5.7 x 4.4 cm, now approximately 4.4 x 2.1 cm. Unchanged exophytic simple cyst about the left inferior pole. The bladder is decompressed with Foley in place. Stomach/Bowel: Stomach is within normal limits. Moderate colonic stool burden. No evidence of bowel wall thickening, distention, or inflammatory changes. Vascular/Lymphatic: Abdominal aortic atherosclerotic calcifications. Similar-appearing bilobed aneurysmal dilation versus multifocal left infrarenal abdominal aortic penetrating atheromatous ulcers (evaluation limited due to lack of intravenous contrast), with maximum cross-sectional aortic diameter measuring up to 29 mm, unchanged. No abdominopelvic lymphadenopathy. Reproductive: Coarse calcifications within the normal size prostate gland. Other: Small fat and small bowel  containing left inguinal hernia without evidence of bowel obstruction or strangulation. No ascites. Musculoskeletal: Similar appearing comminuted fracture of the L1 vertebral body, unchanged 50% height loss and approximately 4 mm of retropulsion. Similar appearing multilevel degenerative changes of the visualized thoracolumbar spine. IMPRESSION: 1. Significantly interval decreased size but persistence of left perinephric fluid collection with good position of indwelling pigtail drainage catheter. Unchanged nonobstructive right upper pole 1.8 cm renal calculus. 2. Similar appearing right staghorn renal calculus with interval decreased gaseous foci within the collecting system after placement of right percutaneous nephrostomy tube. 3. Unchanged comminuted L1 vertebral body burst fracture with approximately 4 mm of retropulsion and 50% height loss. 4. Slight interval increase small right trace left pleural effusions and associated bibasilar subsegmental atelectasis. 5. Similar appearing bilobed aneurysmal dilation versus multifocal penetrating atheromatous ulcers about the infrarenal abdominal aorta measuring up to 29 mm in maximum diameter. 6. Small fat and small bowel containing left inguinal hernia without complicating features. Marliss Coots, MD [facility] Radiology Electronically Signed   By: Marliss Coots MD   On: 01/30/2021 10:05    Labs:  CBC: Recent Labs    01/28/21 0727 01/29/21 0236 01/30/21 0625 01/31/21 0255  WBC 8.7 7.2 6.5 7.4  HGB 8.1* 8.0* 8.0* 8.7*  HCT 25.2* 25.3* 25.3* 28.1*  PLT 258 290 279 293    COAGS: Recent Labs    01/25/21 1013  INR 1.6*    BMP: Recent Labs    02/06/20 0850 02/07/20 0919 02/08/20 0640 02/09/20 0709 06/02/20 0853 01/28/21 0727 01/29/21 0236 01/30/21 0625 01/31/21 0255  NA 140 139 139 139   < >  136 136 135 136  K 3.7 3.6 3.3* 3.6   < > 3.6 3.2* 3.5 3.6  CL 102 100 101 101   < > 108 106 108  105  CO2 28 29 29 29    < > 20* 22 22 24   GLUCOSE 129* 175* 114* 123*   < > 115* 113* 94 96  BUN 14 13 16 18    < > 19 17 16 20   CALCIUM 8.8* 9.1 8.9 8.9   < > 8.6* 8.6* 8.6* 8.7*  CREATININE 1.18 1.27* 1.22 1.22   < > 1.52* 1.45* 1.40* 1.43*  GFRNONAA 57* 52* 55* 55*   < > 45* 48* 50* 49*  GFRAA >60 >60 >60 >60  --   --   --   --   --    < > = values in this interval not displayed.    LIVER FUNCTION TESTS: Recent Labs    02/06/20 1308 06/03/20 0331 01/24/21 2012  BILITOT 1.1 1.5* 0.6  AST 19 14* 14*  ALT 18 12 13   ALKPHOS 76 54 63  PROT 6.1* 5.2* 6.3*  ALBUMIN 3.4* 2.6* 2.8*    Assessment and Plan: Pyelonephritis with abscess s/p left perinephric drain placement 01/25/21 Right renal calculi s/p right PCN placement 01/25/21 afebrile; WBC nl; hgb 8.7(8), creat 1.43(1.4), left perirenal drain cx- enterobacter; cont current tx/OP monitoring/lab checks; further plans as outlined by urology  Electronically Signed: D. 08/03/20, PA-C 01/31/2021, 10:55 AM   I spent a total of 15 minutes at the the patient's bedside AND on the patient's hospital floor or unit, greater than 50% of which was counseling/coordinating care for right nephrostomy/left perirenal abscess drain    Patient ID: , male   DOB: 11-13-1935, 85 y.o.   MRN: Jeananne Rama

## 2021-01-31 NOTE — NC FL2 (Signed)
[place] MEDICAID FL2 LEVEL OF CARE SCREENING TOOL     IDENTIFICATION  Patient Name: Alfred Mercado Birthdate: 1935-09-24 Sex: male Admission Date (Current Location): 01/24/2021  [place] and [place] Number:  Haynes Bast [id number] Facility and Address:  [facility], [address] [place], [place] [address]      Provider Number: [id number]  Attending Physician Name and Address:  Inez Catalina, MD  Relative Name and Phone Number:   (The name of a relative was not provided)    Current Level of Care: Hospital Recommended Level of Care: Skilled Nursing Facility Prior Approval Number:    Date Approved/Denied:   PASRR Number: [id number]  Discharge Plan: SNF    Current Diagnoses: Patient Active Problem List   Diagnosis Date Noted  . Severe sepsis (HCC) 01/25/2021  . Iron deficiency anemia 01/25/2021  . Generalized weakness   . Acute-on-chronic kidney injury (HCC) 01/24/2021  . Complicated urinary tract infection 01/24/2021  . Renal mass, left 01/24/2021  . Staghorn renal calculus 01/24/2021  . Microscopic hematuria 01/24/2021  . Aortic aneurysm (HCC) 01/24/2021  . Closed burst fracture of lumbar vertebra (HCC) 01/24/2021  . Acute respiratory failure with hypoxia (HCC) 08/13/2020  . UTI (urinary tract infection) 06/02/2020  . Community acquired pneumonia of right lower lobe of lung 06/02/2020  . Acute hypoxemic respiratory failure (HCC) 06/02/2020  . Hypokalemia 06/02/2020  . Acute diastolic CHF (congestive heart failure) (HCC) 02/06/2020  . Exertional shortness of breath 10/14/2019  . Atrial fibrillation with RVR (HCC) 10/14/2019  . Pleural effusion   . Chronic heart failure with preserved ejection fraction (HCC)     Orientation RESPIRATION BLADDER Height & Weight     Self,Situation,Place (Patient appears to understand and is agreeable at this time to ST rehab.)  Normal Continent (Bilateral drains in place; Nephrostomy tube placed 6/1.  Per MD note-Cont percutaneous nephrostomy tube with reassessment in outpatient setting.) Weight: 180 lb 8.9 oz (81.9 kg) Height:  6\' 1"  (185.4 cm)  BEHAVIORAL SYMPTOMS/MOOD NEUROLOGICAL BOWEL NUTRITION STATUS      Continent Diet (Heart-healthy)  AMBULATORY STATUS COMMUNICATION OF NEEDS Skin   Limited Assist Verbally Other (Comment) (Ecchymosis right/left arm and leg. puncture wound peri-renal drain insertion site; puncture wound right kidney drain insertion site.)                       Personal Care Assistance Level of Assistance  Bathing,Feeding,Dressing Bathing Assistance: Maximum assistance (Min assist upper body and max assist lower body) Feeding assistance: Limited assistance (assistance with set-up) Dressing Assistance: Maximum assistance (Min assist upper body and max assist lower body)     Functional Limitations Info  Sight,Hearing,Speech Sight Info: Impaired (Wears glasses) Hearing Info: Impaired Speech Info: Adequate    SPECIAL CARE FACTORS FREQUENCY  PT (By licensed PT),OT (By licensed OT) (Nephrostomy tube placed 6/1)     PT Frequency: Evaluated 6/2. PT at SNF  eval and treat a minimum of 5 days per week OT Frequency: Evaluated OT at SNF eval and treat a minimum of 5 days per week            Contractures Contractures Info: Not present    Additional Factors Info  Code Status,Allergies Code Status Info: DNR Allergies Info: No known allergies           Current Medications (01/31/2021):  This is the current hospital active medication list Current Facility-Administered Medications  Medication Dose Route Frequency Provider Last Rate Last Admin  .  0.9 %  sodium chloride infusion   Intravenous PRN Inez Catalina, MD   Stopped at 01/25/21 2204  . acetaminophen (TYLENOL) tablet 650 mg  650 mg Oral Q6H PRN Alphonzo Severance, MD   650 mg at 01/30/21 0015   Or  . acetaminophen (TYLENOL) suppository 650 mg  650 mg Rectal Q6H PRN Alphonzo Severance, MD      . albuterol  (PROVENTIL) (2.5 MG/3ML) 0.083% nebulizer solution 2.5 mg  2.5 mg Nebulization Q6H PRN Inez Catalina, MD      . apixaban Everlene Balls) tablet 5 mg  5 mg Oral BID Debe Coder B, MD   5 mg at 01/31/21 0945  . [START ON 02/01/2021] bisacodyl (DULCOLAX) EC tablet 20 mg  20 mg Oral Daily Steffanie Rainwater, MD   20 mg at 01/31/21 1156  . carbamide peroxide (DEBROX) 6.5 % OTIC (EAR) solution 5 drop  5 drop Both EARS BID Glenford Bayley, MD   5 drop at 01/31/21 0947  . Chlorhexidine Gluconate Cloth 2 % PADS 6 each  6 each Topical Daily Inez Catalina, MD   6 each at 01/31/21 [phone]  . HYDROmorphone (DILAUDID) injection 0.5 mg  0.5 mg Intravenous Q4H PRN Albertha Ghee, MD   0.5 mg at 01/31/21 0058  . hydrOXYzine (ATARAX/VISTARIL) tablet 10 mg  10 mg Oral TID PRN Albertha Ghee, MD   10 mg at 01/30/21 0015  . melatonin tablet 3 mg  3 mg Oral QHS Doran Stabler, DO   3 mg at 01/30/21 2155  . metoprolol tartrate (LOPRESSOR) tablet 25 mg  25 mg Oral BID Steffanie Rainwater, MD   25 mg at 01/31/21 0945  . nicotine (NICODERM CQ - dosed in mg/24 hr) patch 7 mg  7 mg Transdermal Q0600 Alphonzo Severance, MD   7 mg at 01/31/21 0948  . oxyCODONE (Oxy IR/ROXICODONE) immediate release tablet 5 mg  5 mg Oral Q4H PRN Albertha Ghee, MD   5 mg at 01/31/21 1104  . [START ON 02/01/2021] polyethylene glycol (MIRALAX / GLYCOLAX) packet 17 g  17 g Oral Daily Steffanie Rainwater, MD   17 g at 01/31/21 1156  . polyvinyl alcohol (LIQUIFILM TEARS) 1.4 % ophthalmic solution 1 drop  1 drop Both Eyes PRN Inez Catalina, MD   1 drop at 01/30/21 2156  . sodium chloride flush (NS) 0.9 % injection 5 mL  5 mL Intracatheter Q8H Simonne Come, MD   5 mL at 01/30/21 2155  . sulfamethoxazole-trimethoprim (BACTRIM DS) 800-160 MG per tablet 1 tablet  1 tablet Oral Q12H Albertha Ghee, MD   1 tablet at 01/31/21 0945  . tamsulosin (FLOMAX) capsule 0.4 mg  0.4 mg Oral Daily Steffanie Rainwater, MD   0.4 mg at 01/31/21 0945  . umeclidinium bromide (INCRUSE ELLIPTA)  62.5 MCG/INH 1 puff  1 puff Inhalation Daily Alphonzo Severance, MD   1 puff at 01/31/21 1157     Discharge Medications: Please see discharge summary for a list of discharge medications.  Relevant Imaging Results:  Relevant Lab Results:   Additional Information ss#[id number]. COVID vaccinations 01/02/20 and 02/02/20  Cristobal Goldmann, LCSW

## 2021-01-31 NOTE — Plan of Care (Signed)
  Problem: Activity: Goal: Risk for activity intolerance will decrease Outcome: Progressing   Problem: Nutrition: Goal: Adequate nutrition will be maintained Outcome: Progressing   Problem: Elimination: Goal: Will not experience complications related to bowel motility Outcome: Progressing   

## 2021-02-01 NOTE — Plan of Care (Signed)
  Problem: Clinical Measurements: Goal: Diagnostic test results will improve Outcome: Progressing   Problem: Activity: Goal: Risk for activity intolerance will decrease Outcome: Progressing   Problem: Elimination: Goal: Will not experience complications related to bowel motility Outcome: Progressing Goal: Will not experience complications related to urinary retention Outcome: Progressing   

## 2021-02-01 NOTE — TOC Progression Note (Addendum)
Transition of Care [facility]) - Progression Note    Patient Details  Name: Selso Mannor MRN: [id number] Date of Birth: 05/13/1936  [facility]) CM/SW Contact  Okey Dupre Lazaro Arms, LCSW Phone Number: 02/01/2021, 6:20 PM  Clinical Narrative: Attempted several times (8) today to talk with patient about SNF placement, provide him with facility responses and get a decision, however he was either asleep and would not awaken, needed to go to the bathroom, or was eating his lunch. During the visit at 3:25 pm, he awakened when I called his name a few times. [name] was given the names of the facilities that accepted him and CSW tried to inform him of where the facility was located. During this visit, patient constantly talked about being cold, said his stomach hurt and asked CSW for today's date. The Medicare.gov SNF list was placed on patients table with the SNF's marked that accepted him and patient was advised that CSW will return to get a decision. CSW's last visit with patient was 6:32 pm and [name] was still very drowsy and CSW was unable to engage him in conversation. Patient was informed that CSW will visit with him on Thursday.        Expected Discharge Plan: Home w Home Health Services    Expected Discharge Plan and Services Expected Discharge Plan: Home w Home Health Services       Living arrangements for the past 2 months: No permanent address,Hotel/Motel (Lives [facility])                                     Social Determinants of Health (SDOH) Interventions  No SDOH interventions requested or needed at this time.  Readmission Risk Interventions No flowsheet data found.

## 2021-02-01 NOTE — Progress Notes (Signed)
Subjective: No acute events overnight.  Alfred Mercado is doing well this morning. No acute new complaints. Regarding SNF placement, SW spoke to Alfred Mercado yesterday. I spoke to Alfred Mercado in more detail yesterday afternoon about it and told Alfred Mercado to think about it more overnight. This morning, Alfred Mercado states Alfred Mercado's "having an open mind about it." Otherwise, foley was successfully removed with two PVRs within goal of <200cc and Alfred Mercado reports voiding well.   Objective:  Vital signs in last 24 hours: Vitals:   01/31/21 1033 01/31/21 1735 01/31/21 2200 02/01/21 0610  BP: (!) 99/51 121/72 120/76 126/90  Pulse: 80 (!) 106 (!) 104   Resp: 18 17 16    Temp: 98.1 F (36.7 C) 97.7 F (36.5 C) 98.1 F (36.7 C) 98.4 F (36.9 C)  TempSrc:  Oral Oral Oral  SpO2: 100% 100% 98% 97%  Weight:      Height:       Weight change:   Intake/Output Summary (Last 24 hours) at 02/01/2021 0830 Last data filed at 02/01/2021 8657 Gross per 24 hour  Intake 875 ml  Output 340 ml  Net 535 ml   Physical Exam Constitutional:      Comments: Resting comfortably in bed, no acute distress  HENT:     Mouth/Throat:     Mouth: Mucous membranes are moist.  Cardiovascular:     Rate and Rhythm: Normal rate and regular rhythm.     Heart sounds: No murmur heard.   Pulmonary:     Effort: Pulmonary effort is normal.     Breath sounds: Normal breath sounds.  Abdominal:     Tenderness: There is no guarding or rebound.     Comments: Mildly firm, mild tenderness  Genitourinary:    Comments: R drain with dark urine L drain with 5cc of serosanguinous fluid Musculoskeletal:     Right lower leg: No edema.     Left lower leg: No edema.  Skin:    General: Skin is warm and dry.    Assessment/Plan:  Principal Problem:   Complicated urinary tract infection Active Problems:   Hypokalemia   Acute-on-chronic kidney injury ([place])   Renal mass, left   Staghorn renal calculus   Microscopic hematuria   Aortic aneurysm (HCC)   Closed burst  fracture of lumbar vertebra (HCC)   Severe sepsis (HCC)   Iron deficiency anemia   Generalized weakness  This is hospital day #89frMr. Alfred Mercado,an89year old man with history of HFpEF (06/2019 EF 50-55%)with associatedright pleural effusion s/p thoracentesis, atrial fibrillation on Eliquis, heavy tobacco use, COPD,andvitamin D deficiencywho presented withgeneralized weakness and recent falls.  Chronic conditions requiring follow-up outlined in hospital course.  PT/OT continuing to recommend SNF placement at this time. LCSW met with Alfred Mercado yesterday about this in more detail and gave Alfred Mercado a list of options. Today, Alfred Mercado states Alfred Mercado's keeping an open mind about SNF placement.   #Constipation  No BM yesterday per patient report. However, improved abdominal tenderness.  - Cont scheduled Bisacodyl and Miralax - Consider enema if worsens   #Complicated UTI withleftpyelonephritis abscess, improving - Urologyand VIRfollowing - Per Urology last note, contJP bulb drainage until no drainage present - Cont bactrim [id number] (day 2/14) for Enterobacter  - Dilaudid and oxycodone PRN for pain  #Right staghorn renal calculus with emphysematous features, improving - Urologyfollowing -Foley successfully removed yesterday with 2 PVR within goal of <200cc - Cont post-void bladder scans for now -Cont percutaneous nephrostomy tube with reassessment in outpatient setting  -ContFlomax 0.73m25maily   #  CKD IIIa, stable Initiallypresented with an AKI on top of known CKD IIIa(baseline Cr 1.2-1.4). Now back to baseline. -Cr ~1.4, GFR ~40-50, stable - BMP tomorrow  #Iron deficiency anemia,stable Baseline Hgb 10-11. Received IV iron during admission.  - Hgb 8.7 yesterday -CBC tomorrow  #HFpEF, stable #Right pleural effusion, stable -Contmetoprolol 63m BIDand Lasix  - Cont to hold diltiazemwith intermittent soft BP but consider restarting soon as his BP increase    #Atrial fibrillation, stable -Cont Eliquis 57mBID  #COPD, stable #Tobacco use disorder, stable - Cont Spiriva and albuterol  - Cont nicotine patch    LOS: 8 days   RaMalachi CarlMedical Student 02/01/2021, 8:30 AM

## 2021-02-01 NOTE — Progress Notes (Signed)
Per Dr. Nedra Hai and Hyacinth Meeker, PIV access not necessary at this time.

## 2021-02-01 NOTE — Progress Notes (Signed)
Physical Therapy Treatment Patient Details Name: Alfred Mercado MRN: [id number] DOB: 1936-07-18 Today's Date: 02/01/2021    History of Present Illness 85 year old man who presented with progressive weakness and falls for a few weeks. Hitting his back (lumbar burst fracture present on imaging), also remembers a fall where he may have lost consciousness and he had difficulty getting up. In ED found to have comminuted L1 burst fracture with retropulsion  Admitted 01/24/21 for treatment of Complicated urinary tract infection with possible pyelonephritic abscess, Left Complex cystic renal mass, Right Staghorn renal calculus with emphysematous features in the right renal collecting system.  PMH of HFpEF, Afib on eliquis, right pleural effusion s/p thoracentesis, tobacco use    PT Comments    Pt bed alarm sounding as PT approaches room. Pt found wedged between bedrail and footboard trying to get up to urinate. PT removed bed rail and pt requesting to return to bed despite effort to get him up to [facility]. Pt flops back on bed requiring min guard to get back in bed and maxA to scoot up in bed. Brought HoB up and pt reports need to urinate and begins to come to EoB again. Assisted pt to EoB, pt able to stand with min guard and urinate in urinal. Pt with complaints of back and abdomen pain and refuses any additional therapy. Sits back on bed and again flops back on bed needing maxA for returning to bed. Educated pt on back precautions and rolling throughout session pt ignores/doesn't hear cuing with impulsive movement. D/c plans remain appropriate. PT will continue to follow acutely.   Follow Up Recommendations  Supervision - Intermittent;SNF     Equipment Recommendations  Other (comment) (Rollator)       Precautions / Restrictions Precautions Precautions: Fall Precaution Comments: 2-3 falls in last month, dizziness, Required Braces or Orthoses: Spinal Brace Spinal Brace: Thoracolumbosacral orthotic Spinal  Brace Comments: has TLSO in room that was to be used for comfort with OOB, however now he has 2 drains coming out of his back making it painful and difficulty to place brace Restrictions Weight Bearing Restrictions: No    Mobility    Balance Overall balance assessment: Needs assistance Sitting-balance support: Feet supported;No upper extremity supported Sitting balance-[name]: Fair     Standing balance support: No upper extremity supported;During functional activity (heavy support from bed on posterior LE)                                Cognition Arousal/Alertness: Awake/alert Behavior During Therapy: WFL for tasks assessed/performed;Restless Overall Cognitive Status: Impaired/Different from baseline Area of Impairment: Safety/judgement;Problem solving                         Safety/Judgement: Decreased awareness of safety;Decreased awareness of deficits   Problem Solving: Slow processing General Comments: pt with poor safety awareness trying to get out of bed with bed alarm sounding, instead of calling for assist with using urinal         General Comments General comments (skin integrity, edema, etc.): Pt confused about removal of condom catheter, and wether he can urinate or not in the bed.      Pertinent Vitals/Pain Pain Assessment: Faces Faces Pain Scale: Hurts whole lot Pain Location: back, abdomen Pain Descriptors / Indicators: Grimacing;Guarding;Discomfort Pain Intervention(s): Limited activity within patient's tolerance;Monitored during session;Repositioned           PT  Goals (current goals can now be found in the care plan section) Acute Rehab PT Goals Patient Stated Goal: go back home PT Goal Formulation: With patient Time For Goal Achievement: 02/09/21 Potential to Achieve Goals: Fair Progress towards PT goals: Not progressing toward goals - comment (limited by pain)    Frequency    Min 3X/week      PT Plan Discharge  plan needs to be updated       AM-PAC PT "6 Clicks" Mobility   Outcome Measure  Help needed turning from your back to your side while in a flat bed without using bedrails?: None Help needed moving from lying on your back to sitting on the side of a flat bed without using bedrails?: A Little Help needed moving to and from a bed to a chair (including a wheelchair)?: A Lot Help needed standing up from a chair using your arms (e.g., wheelchair or bedside chair)?: A Lot Help needed to walk in hospital room?: A Little Help needed climbing 3-5 steps with a railing? : Total 6 Click Score: 15    End of Session Equipment Utilized During Treatment: Gait belt Activity Tolerance: Patient tolerated treatment well Patient left: in chair;with call bell/phone within reach;with chair alarm set Nurse Communication: Mobility status;Other (comment) (pt would like his eye wash) PT Visit Diagnosis: Unsteadiness on feet (R26.81);Other abnormalities of gait and mobility (R26.89);Repeated falls (R29.6);Muscle weakness (generalized) (M62.81);History of falling (Z91.81);Difficulty in walking, not elsewhere classified (R26.2);Pain Pain - part of body:  (abdomen)     Time: 7371-0626 PT Time Calculation (min) (ACUTE ONLY): 15 min  Charges:  $Therapeutic Activity: 8-22 mins                     Trinika Cortese B. Beverely Risen PT, DPT Acute Rehabilitation Services Pager [phone] Office [phone]    [name] 02/01/2021, 10:04 AM

## 2021-02-02 ENCOUNTER — Other Ambulatory Visit (HOSPITAL_COMMUNITY): Payer: Self-pay

## 2021-02-02 LAB — BASIC METABOLIC PANEL
Anion gap: 9 (ref 5–15)
BUN: 20 mg/dL (ref 8–23)
CO2: 20 mmol/L — ABNORMAL LOW (ref 22–32)
Calcium: 8.7 mg/dL — ABNORMAL LOW (ref 8.9–10.3)
Chloride: 106 mmol/L (ref 98–111)
Creatinine, Ser: 1.88 mg/dL — ABNORMAL HIGH (ref 0.61–1.24)
GFR, Estimated: 35 mL/min — ABNORMAL LOW (ref >60)
Glucose, Bld: 97 mg/dL (ref 70–99)
Potassium: 4.2 mmol/L (ref 3.5–5.1)
Sodium: 135 mmol/L (ref 135–145)

## 2021-02-02 LAB — CBC
HCT: 26.8 % — ABNORMAL LOW (ref 39.0–52.0)
Hemoglobin: 8.3 g/dL — ABNORMAL LOW (ref 13.0–17.0)
MCH: 25.2 pg — ABNORMAL LOW (ref 26.0–34.0)
MCHC: 31 g/dL (ref 30.0–36.0)
MCV: 81.2 fL (ref 80.0–100.0)
Platelets: 296 10*3/uL (ref 150–400)
RBC: 3.3 MIL/uL — ABNORMAL LOW (ref 4.22–5.81)
RDW: 16.5 % — ABNORMAL HIGH (ref 11.5–15.5)
WBC: 8.5 10*3/uL (ref 4.0–10.5)
nRBC: 0 % (ref 0.0–0.2)

## 2021-02-02 MED ORDER — NICOTINE 7 MG/24HR TD PT24
7.0000 mg | MEDICATED_PATCH | Freq: Every day | TRANSDERMAL | 0 refills | Status: DC
  Filled 2021-02-02: qty 28, 28d supply, fill #0

## 2021-02-02 MED ORDER — TAMSULOSIN HCL 0.4 MG PO CAPS
0.4000 mg | ORAL_CAPSULE | Freq: Every day | ORAL | 0 refills | Status: AC
  Filled 2021-02-02: qty 30, 30d supply, fill #0

## 2021-02-02 MED ORDER — SULFAMETHOXAZOLE-TRIMETHOPRIM 800-160 MG PO TABS
1.0000 | ORAL_TABLET | Freq: Two times a day (BID) | ORAL | 0 refills | Status: DC
  Filled 2021-02-02: qty 22, 11d supply, fill #0

## 2021-02-02 MED ORDER — HYDROXYZINE HCL 10 MG PO TABS
10.0000 mg | ORAL_TABLET | Freq: Three times a day (TID) | ORAL | 0 refills | Status: DC | PRN
Start: 2021-02-02 — End: 2021-07-06
  Filled 2021-02-02: qty 30, 10d supply, fill #0

## 2021-02-02 NOTE — Plan of Care (Signed)
  Problem: Nutrition: Goal: Adequate nutrition will be maintained Outcome: Adequate for Discharge   

## 2021-02-02 NOTE — Progress Notes (Signed)
Referring Physician(s): Pace,M  Supervising Physician: Irish Lack  Patient Status:  [facility] - In-pt  Chief Complaint:  F/u Perinephric drain  Brief History:  [name] is an 85 yo male who presented to the ED on 01/25/21 c/o back pain.  He was found to have pyelonephritis with abscess and a staghorn calculus with scattered gas throughout the right renal collecting system.  He underwent left perinephric drain placement and a right PCN placement by Dr. Grace Isaac on 01/25/21.  Urology called and reported minimal serosanguinous drainage in the bulb and is requesting removal of the drain.  Subjective:  Lots of complains. He has "to pee" and "can't pee unless he stands up", he wants to talk to someone because he is "being kicked out of here", he wants "that tube out of my back", and he says no one is telling him what is going on.   Allergies: Patient has no known allergies.  Medications: Prior to Admission medications   Medication Sig Start Date End Date Taking? Authorizing Provider  acetaminophen (TYLENOL) 325 MG tablet Take 650 mg by mouth every 6 (six) hours as needed for mild pain or headache.   Yes [provider]  albuterol (VENTOLIN HFA) 108 (90 Base) MCG/ACT inhaler Inhale 2 puffs into the lungs every 6 (six) hours as needed for wheezing or shortness of breath. 11/30/20  Yes Hoy Register, MD  apixaban (ELIQUIS) 5 MG TABS tablet Take 1 tablet (5 mg total) by mouth 2 (two) times daily. 11/30/20  Yes Hoy Register, MD  bisacodyl (DULCOLAX) 5 MG EC tablet Take 20 mg by mouth daily as needed for moderate constipation.   Yes [provider]  diltiazem (CARDIZEM SR) 90 MG 12 hr capsule Take 1 capsule (90 mg total) by mouth 2 (two) times daily. 11/30/20  Yes Hoy Register, MD  furosemide (LASIX) 40 MG tablet Take 1 tablet (40 mg total) by mouth See admin instructions. Patient taking differently: Take 40 mg by mouth daily. 11/30/20  Yes Hoy Register, MD   metoprolol tartrate (LOPRESSOR) 25 MG tablet TAKE 1 TABLET(25 MG) BY MOUTH TWICE DAILY Patient taking differently: Take 25 mg by mouth 2 (two) times daily. 11/30/20  Yes Hoy Register, MD  Misc. Devices MISC Power wheelchair.  Diagnosis atrial fibrillation, gait abnormality 12/01/20  Yes Newlin, Odette Horns, MD  Tiotropium Bromide Monohydrate (SPIRIVA RESPIMAT) 2.5 MCG/ACT AERS INHALE 2 PUFFS BY MOUTH DAILY Patient taking differently: Inhale 2 puffs into the lungs daily. INHALE 2 PUFFS BY MOUTH DAILY 11/30/20  Yes Newlin, Odette Horns, MD  Vitamin D, Ergocalciferol, (DRISDOL) 1.25 MG (50000 UNIT) CAPS capsule TAKE 1 CAPSULE BY MOUTH 1 TIME A WEEK Patient taking differently: Take 50,000 Units by mouth every 7 (seven) days. 12/01/20  Yes Hoy Register, MD  carbamide peroxide (DEBROX) 6.5 % OTIC solution Place 5 drops into the right ear 2 (two) times daily. Patient not taking: Reported on 01/24/2021 11/30/20   Hoy Register, MD  nicotine (NICODERM CQ - DOSED IN MG/24 HOURS) 21 mg/24hr patch Place 1 patch (21 mg total) onto the skin daily. Patient not taking: Reported on 01/24/2021 06/07/20   Hoy Register, MD  nicotine (NICODERM CQ - DOSED IN MG/24 HOURS) 21 mg/24hr patch PLACE 1 PATCH (21 MG TOTAL) ONTO THE SKIN DAILY. Patient not taking: Reported on 01/24/2021 06/06/20 06/06/21  Dolan Amen, MD  polyethylene glycol powder [facility]) 17 GM/SCOOP powder Take 17 g by mouth daily. Patient not taking: Reported on 01/24/2021 03/02/20   Hoy Register, MD  Vital Signs: BP 96/64 (BP Location: Left Arm)   Pulse 89   Temp 97.7 F (36.5 C) (Oral)   Resp 20   Ht 6\' 1"  (1.854 m)   Wt 81.9 kg   SpO2 95%   BMI 23.82 kg/m   Physical Exam Constitutional:      Appearance: Normal appearance.  HENT:     Head: Normocephalic and atraumatic.  Cardiovascular:     Rate and Rhythm: Normal rate.  Pulmonary:     Effort: Pulmonary effort is normal. No respiratory distress.  Genitourinary:    Comments:  Left perinephric drain in place. Trace serosanguinous fluid in bulb. Removed without difficulty. Skin:    General: Skin is warm and dry.  Neurological:     General: No focal deficit present.     Mental Status: He is alert.    Imaging: CT ABDOMEN PELVIS WO CONTRAST  Result Date: 01/30/2021 CLINICAL DATA:  85 year old male with history of renal abscess. EXAM: CT ABDOMEN AND PELVIS WITHOUT CONTRAST TECHNIQUE: Multidetector CT imaging of the abdomen and pelvis was performed following the standard protocol without IV contrast. COMPARISON:  01/24/2021 FINDINGS: Lower chest: Similar appearing moderate global cardiomegaly. Similar appearing trace pericardial effusion. Severe coronary atherosclerotic calcifications. Slight interval increased volume of small right and trace left pleural effusions. Bibasilar subsegmental atelectasis, right greater than left. Hepatobiliary: No acute focal liver abnormality is seen. Similar appearing segment 4 a simple cyst measuring up to approximately 2.4 cm. No gallstones, gallbladder wall thickening, or biliary dilatation. Pancreas: Diffuse fatty atrophy, unchanged. No pancreatic ductal dilatation or surrounding inflammatory changes. Spleen: Normal in size without focal abnormality. Adrenals/Urinary Tract: Adrenal glands are within normal limits bilaterally. Interval insertion of right midpole percutaneous nephrostomy tube. The pigtail portion is coiled in a mid pole major calyx. There are trace residual foci of gas within the right collecting system, and significantly decreased from comparison. Unchanged extensive right staghorn calculus. Minimal right perinephric fat stranding. Status post interval placement of a left perinephric pigtail drainage catheter which appears well positioned. Similar appearing left upper pole nonobstructive renal calculus measuring up to 1.8 cm. Interval decrease in previously visualized thick walled left posterior perinephric fluid collection which  previously measured up to approximately 5.7 x 4.4 cm, now approximately 4.4 x 2.1 cm. Unchanged exophytic simple cyst about the left inferior pole. The bladder is decompressed with Foley in place. Stomach/Bowel: Stomach is within normal limits. Moderate colonic stool burden. No evidence of bowel wall thickening, distention, or inflammatory changes. Vascular/Lymphatic: Abdominal aortic atherosclerotic calcifications. Similar-appearing bilobed aneurysmal dilation versus multifocal left infrarenal abdominal aortic penetrating atheromatous ulcers (evaluation limited due to lack of intravenous contrast), with maximum cross-sectional aortic diameter measuring up to 29 mm, unchanged. No abdominopelvic lymphadenopathy. Reproductive: Coarse calcifications within the normal size prostate gland. Other: Small fat and small bowel containing left inguinal hernia without evidence of bowel obstruction or strangulation. No ascites. Musculoskeletal: Similar appearing comminuted fracture of the L1 vertebral body, unchanged 50% height loss and approximately 4 mm of retropulsion. Similar appearing multilevel degenerative changes of the visualized thoracolumbar spine. IMPRESSION: 1. Significantly interval decreased size but persistence of left perinephric fluid collection with good position of indwelling pigtail drainage catheter. Unchanged nonobstructive right upper pole 1.8 cm renal calculus. 2. Similar appearing right staghorn renal calculus with interval decreased gaseous foci within the collecting system after placement of right percutaneous nephrostomy tube. 3. Unchanged comminuted L1 vertebral body burst fracture with approximately 4 mm of retropulsion and 50% height loss. 4. Slight interval  increase small right trace left pleural effusions and associated bibasilar subsegmental atelectasis. 5. Similar appearing bilobed aneurysmal dilation versus multifocal penetrating atheromatous ulcers about the infrarenal abdominal aorta  measuring up to 29 mm in maximum diameter. 6. Small fat and small bowel containing left inguinal hernia without complicating features. Marliss Coots, MD [facility] Electronically Signed   By: Marliss Coots MD   On: 01/30/2021 10:05    Labs:  CBC: Recent Labs    01/29/21 0236 01/30/21 0625 01/31/21 0255 02/02/21 0217  WBC 7.2 6.5 7.4 8.5  HGB 8.0* 8.0* 8.7* 8.3*  HCT 25.3* 25.3* 28.1* 26.8*  PLT 290 279 293 296    COAGS: Recent Labs    01/25/21 1013  INR 1.6*    BMP: Recent Labs    02/06/20 0850 02/07/20 0919 02/08/20 0640 02/09/20 0709 06/02/20 0853 01/29/21 0236 01/30/21 0625 01/31/21 0255 02/02/21 0217  NA 140 139 139 139   < > 136 135 136 135  K 3.7 3.6 3.3* 3.6   < > 3.2* 3.5 3.6 4.2  CL 102 100 101 101   < > 106 108 105 106  CO2 28 29 29 29    < > 22 22 24  20*  GLUCOSE 129* 175* 114* 123*   < > 113* 94 96 97  BUN 14 13 16 18    < > 17 16 20 20   CALCIUM 8.8* 9.1 8.9 8.9   < > 8.6* 8.6* 8.7* 8.7*  CREATININE 1.18 1.27* 1.22 1.22   < > 1.45* 1.40* 1.43* 1.88*  GFRNONAA 57* 52* 55* 55*   < > 48* 50* 49* 35*  GFRAA >60 >60 >60 >60  --   --   --   --   --    < > = values in this interval not displayed.    LIVER FUNCTION TESTS: Recent Labs    02/06/20 1308 06/03/20 0331 01/24/21 2012  BILITOT 1.1 1.5* 0.6  AST 19 14* 14*  ALT 18 12 13   ALKPHOS 76 54 63  PROT 6.1* 5.2* 6.3*  ALBUMIN 3.4* 2.6* 2.8*    Assessment and Plan:  Pyelonephritis with abscess and a staghorn calculus with scattered gas throughout the right renal collecting system.  S/P left perinephric drain placement and a right PCN placement by Dr. on 01/25/21.  Left perinephric drain removed without difficulty.  Can remove dressing on Saturday.  Electronically Signed: 01/26/21, PA-C 02/02/2021, 10:19 AM    I spent a total of 15 Minutes at the the patient's bedside AND on the patient's hospital floor or unit, greater  than 50% of which was counseling/coordinating care for removal of perinephric drain.

## 2021-02-02 NOTE — Progress Notes (Signed)
DISCHARGE NOTE HOME Alfred Mercado to be discharged DISCHARGE NOTE HOME Alfred Mercado to be discharged Home per MD order. Discussed prescriptions and follow up appointments with the patient. Prescriptions given to patient; medication list explained in detail. Patient verbalized understanding.  Skin clean, dry and intact without evidence of skin break down, no evidence of skin tears noted. IV catheter discontinued intact. Site without signs and symptoms of complications. Dressing and pressure applied. Pt denies pain at the site currently. No complaints noted.  Patient has right neprostomy tube Home health has been arranged for tube care.  An After Visit Summary (AVS) was printed and given to the patient. Patient escorted via wheelchair, and discharged home via private auto.  [place], Kem Kays, RN

## 2021-02-02 NOTE — TOC Progression Note (Addendum)
Transition of Care [facility]) - Progression Note  LATE ENTRY   Patient Details  Name: Philbert Ocallaghan MRN: [id number] Date of Birth: 1936/03/11  Transition of Care [facility]) CM/SW Contact  Okey Dupre Lazaro Arms, LCSW Phone Number: 02/02/2021, 4:53 PM  Clinical Narrative:  CSW talked with patient this morning (9:42 am) regarding his discharge to a skilled nursing facility in an attempt to get a decision, however patient did not want to talk about it. CSW talked again to patient with Dr. Criselda Peaches and other doctor's who work with her regarding SNF and [name] still did not commit to going to a facility.  The doctors followed up later with [name] and he continued to refuse placement and was discharged back to the motel where he lives.   Expected Discharge Plan: Home w Home Health Services    Expected Discharge Plan and Services Expected Discharge Plan: Home w Home Health Services       Living arrangements for the past 2 months: No permanent address, Hotel/Motel (Lives [facility]) Expected Discharge Date: 02/02/21                                     Social Determinants of Health (SDOH) Interventions    Readmission Risk Interventions No flowsheet data found.

## 2021-02-02 NOTE — Discharge Summary (Addendum)
Name: Alfred Mercado MRN: [id number] DOB: [birth year] 85 y.o. PCP: Hoy Register, MD  Date of Admission: 01/24/2021  1:09 PM Date of Discharge: 02/02/2021 Attending Physician: Inez Catalina, MD  Discharge Diagnosis: Complicated UTI with associated left pyelonephritic abscess Right staghorn calculus with emphysematous features L1 burst fracture Infrarenal aortic aneurysm Interpolar right renal cortical lesion  Discharge Medications: Allergies as of 02/02/2021   No Known Allergies      Medication List     STOP taking these medications    Debrox 6.5 % OTIC solution Generic drug: carbamide peroxide   diltiazem 90 MG 12 hr capsule Commonly known as: CARDIZEM SR   furosemide 40 MG tablet Commonly known as: LASIX   nicotine 21 mg/24hr patch Commonly known as: NICODERM CQ - dosed in mg/24 hours Replaced by: nicotine 7 mg/24hr patch   polyethylene glycol powder 17 GM/SCOOP powder Commonly known as: GLYCOLAX/MIRALAX       TAKE these medications    acetaminophen 325 MG tablet Commonly known as: TYLENOL Take 650 mg by mouth every 6 (six) hours as needed for mild pain or headache.   albuterol 108 (90 Base) MCG/ACT inhaler Commonly known as: VENTOLIN HFA Inhale 2 puffs into the lungs every 6 (six) hours as needed for wheezing or shortness of breath.   Dulcolax 5 MG EC tablet Generic drug: bisacodyl Take 20 mg by mouth daily as needed for moderate constipation.   Eliquis 5 MG Tabs tablet Generic drug: apixaban Take 1 tablet (5 mg total) by mouth 2 (two) times daily.   hydrOXYzine 10 MG tablet Commonly known as: ATARAX/VISTARIL Take 1 tablet (10 mg total) by mouth 3 (three) times daily as needed for itching.   metoprolol tartrate 25 MG tablet Commonly known as: LOPRESSOR TAKE 1 TABLET(25 MG) BY MOUTH TWICE DAILY What changed:  how much to take how to take this when to take this additional instructions   Misc. Devices Misc Power wheelchair.  Diagnosis  atrial fibrillation, gait abnormality   nicotine 7 mg/24hr patch Commonly known as: NICODERM CQ - dosed in mg/24 hr Place 1 patch (7 mg total) onto the skin daily at 6 (six) AM. Start taking on: February 03, 2021 Replaces: nicotine 21 mg/24hr patch   Spiriva Respimat 2.5 MCG/ACT Aers Generic drug: Tiotropium Bromide Monohydrate INHALE 2 PUFFS BY MOUTH DAILY What changed:  how much to take how to take this when to take this   sulfamethoxazole-trimethoprim 800-160 MG tablet Commonly known as: BACTRIM DS Take 1 tablet by mouth every 12 (twelve) hours for 11 days.   tamsulosin 0.4 MG Caps capsule Commonly known as: FLOMAX Take 1 capsule (0.4 mg total) by mouth daily. Start taking on: February 03, 2021   Vitamin D (Ergocalciferol) 1.25 MG (50000 UNIT) Caps capsule Commonly known as: DRISDOL TAKE 1 CAPSULE BY MOUTH 1 TIME A WEEK What changed:  how much to take how to take this when to take this additional instructions       Disposition and follow-up:   AlfredAlfred Alfred Mercado was discharged from [facility] in stable and improved condition.  At the hospital follow up visit please address:  1.  Weakness/deconditioning, HTN (dilt held on discharge), bactrim course, urology f/u appointment, PT/OT appointments, derm referral, and below labs/imaging   2.  Labs / imaging needed at time of follow-up:  BMP CBC Aortic aneurysm U/S f/u in 3 years (June 2025) Lumbar X-rays in 1 month for burst fracture (July 2022) MRI w and w/o contrast for  new renal lesion (Sept - Dec 2022) Skin biopsy/derm referral for left cheek lesion concerning for skin cancer  3.  Pending labs / test needing follow-up: None  Follow-up Appointments: [facility] follow[facility] PT/OT  Hospital Course by problem list: 1. Complicated UTI with left pyelonephritic abscess, improved, with associated deconditioning: Mr. Alfred EuropeJamerson presented with after several falls with weakness. UA  demonstrated many bacteria and WBC that was negative for nitrites, suggesting a microorganism other than E coli. Further workup revealed a cystic lesion of the left kidney that was later confirmed (initial concern of abscess vs hematoma vs RCC) to be an abscess after drainage of pus by VIR. There was an initial concern for sepsis given softer BP readings; however, he was never febrile, never had leukocytosis, and improved after two fluid boluses. He was given a JP bulb for drainage of remaining pus/infectious products by VIR. He was on Cefepime while blood/wound cultures were pending. On June 7, re-incubated cultures grew Enterobacter and a subsequent CT scan revealed decreasing size of abscess. Therefore, on 6/7, he was transitioned to oral Bactrim 800-160mg  BID for 14 days. On June 9, the drain was removed on the left side without complication. Throughout his stay, he worked with PT/OT who recommended home health vs SNF. Due to his living situation (motel), SNF was the most appropriate and safe option to address his deconditioning and weakness from his illness and hospitalization. However, he elected to go back to his motel. Outpatient PT/OT will be arranged. He will follow-up with Urology outpatient. He will continue Bactrim for a total of 14 days of therapy. He can remove his dressing on Saturday, June 11.  2. Right staghorn renal calculus with emphysematous features: He's had a staghorn calculus for years now. On initial presentation, there was no concern for a fistula or other complications. As such, VIR placed a drain on the R side for decompression and drainage. In addition, a foley was placed. The drain was kept in place throughout admission with dark sediment drainage at first with transition to normal urine the last few days of admission. He was started on Flomax 0.4mg  daily to help with voiding. After Foley removal, he voided appropriately with PVR <200cc, which were in goal set by Urology. He will be  discharged with the R drain in place and continue on Flomax 0.4mg  daily with follow-up with Urology outpatient with no acute plans for intervention.  3. AKI on CKD IIIa: He has a known history of CKD stage IIIa. He presented with a Cr of 2.11, BUN of 24, and GFR of 30. He received two initial 500cc fluid boluses. He also intermittently received mIVF via LR. His AKI improved throughout admission until the day of discharge, where his Cr bumped from 1.4 to 1.88. Oral hydration was encouraged given preparation for discharge.  4. Iron deficiency anemia: His baseline Hgb is 10-11 per chart review. Given low MCV and high RDW, iron studies done which were consistent with iron deficiency anemia. As such, he received IV iron replacement. His Hgb mostly remained between 8-9 during the admission with no acute concerns or plans at discharge.  5. HFpEF with associated R pleural effusion: This is a chronic problem for him with no acute concerns during this admission. His home medications were initially held due to soft blood pressure readings. However, given intermittent tachycardia, his metoprolol 25mg  BID was restarted. His Lasix was also restarted during admission. Diltiazem was held due to blood pressure concerns. He will be  discharged on his home metoprolol and Lasix; diltiazem will continue to be held but can be considered at hospital follow-up.   6. Atrial fibrillation: He takes Eliquis 5mg  BID at home. This was initially held for his procedure and then restarted the day after at 2.5mg  BID due to a Cr of >1.5 during his AKI. After his Cr stabilized back to baseline, his Eliquis dose was restarted to 5mg  BID. He was intermittently tachycardic which improved once metoprolol was restarted. He will be discharged on his home Eliquis 5mg  BID.  7. COPD with associated tobacco use disorder: No acute concerns or oxygen requirements during this admission. His home Spiriva and albuterol were continued. He received a nicotine  patch during admission.  8. Constipation: During the admission, he had intermittent constipation with LLQ tenderness. He was given Miralax and Bisacodyl that helped him move his bowels.   9. Eye pain: After his procedure, he complained of R eye pain. He was unable to characterize it but had no loss of vision or any exam findings consistent with infection, laceration, globe injury, or drainage. He was given warm compresses and artificial tears which relieved his symptoms.  10. Hearing loss with associated cerumen impaction: He has known hearing loss, particularly in his R ear. He's had cerumen cleanings before. During otoscopic exam, cerumen was particularly built up in his R ear. He was given Debrox drops during admission.   11. Anxiety with associated itching: He reported anxiety on June 5 and then again on June 6. He was prescribed Atarax 10mg  TID PRN that he used for a few days for both anxiety and itching related to his drainage sites and opioid use.  12. Hypokalemia: Throughout admission, he had a few days of hypokalemia. He was repleted with potassium chloride with normal potassium levels on discharge.  13. Infrarenal aortic aneurysm: A 3.3cm aneurysm was noted on his CT chest during admission. Given size, an ultrasound for follow-up is recommended in 3 years.   14. L1 burst fracture with retropulsion: Leading into admission, he had weakness and several falls. Both CT and MRI noted an L1 burst fracture with retropulsion. Neurosurgery was consulted and did not recommend any acute interventions. He was prescribed a brace he used intermittently. Follow-up X-rays are recommended in 1 month.   15. Interpolar right renal cortical lesion: On MRI adbomen, an indeterminate 0.9cm lesion was noted on his right kidney. Cancer could not be ruled out. As such, follow-up MRI is recommended in 3-6 months.  16. Skin lesion on left cheek: On admission and throughout his stay, a multi-colored several cm lesion  was noted on his left cheek. The characteristics are concerning for cancer. A skin biopsy/derm referral is recommended at hospital follow-up.   Discharge Exam:   BP 96/64 (BP Location: Left Arm)   Pulse 89   Temp 97.7 F (36.5 C) (Oral)   Resp 20   Ht 6\' 1"  (1.854 m)   Wt 81.9 kg   SpO2 95%   BMI 23.82 kg/m  Discharge exam: Physical Exam Constitutional:      Comments: Elderly man resting in bed comfortably, no acute distress, mildly ill appearing  Eyes:     Conjunctiva/sclera: Conjunctivae normal.  Cardiovascular:     Rate and Rhythm: Normal rate and regular rhythm.     Heart sounds: No murmur heard. Pulmonary:     Effort: Pulmonary effort is normal. No respiratory distress.     Breath sounds: Normal breath sounds.  Abdominal:  Tenderness: There is no guarding or rebound.     Comments: Mild tenderness  Genitourinary:    Comments: L drain removed R drain in place draining yellow urine Musculoskeletal:        General: No swelling.     Right lower leg: No edema.     Left lower leg: No edema.  Skin:    General: Skin is warm and dry.     Comments: Skin lesion on left cheek, multiple colors   Pertinent Labs, Studies, and Procedures:  CT abdomen/pelvis: Comminuted L1 vertebral body burst fracture with approximately 4 mm of retropulsion and 50% height loss. Bilobed aneurysmal dilation versus multifocal penetrating atheromatous ulcers about the infrarenal abdominal aorta measuring up to 29 mm in maximum diameter. Significantly interval decreased size but persistence of left perinephric fluid collection with good position of indwelling pigtail drainage catheter. Unchanged nonobstructive right upper pole 1.8 cm renal calculus.  CT head: Atrophy with small vessel chronic ischemic changes of deep cerebral white matter. 2. Low-attenuation LEFT frontotemporal extra-axial collection question sequela of old subdural hematoma versus subdural hygroma.  MRI abdomen:  Indeterminate  tiny 0.9 cm interpolar right renal cortical lesion with questionable low level enhancement, cannot exclude renal neoplasm. Suggest attention to the bilateral renal findings on follow-up MRI abdomen without and with IV contrast in 3-6 months.  Discharge Instructions (also in AVS): Alfred Mercado, it was a pleasure to meet you and help treat you during your hospitalization here at [facility]. While you were here, we treated you for an infection of your left kidney as well as a kidney stone in your right kidney. Instructions outlined below: You wll continue antibiotics (Bactrim) until June 20 (6/20) to clear the infection. You will be leaving with the drain on your right side remaining in place. You will follow-up with Urology for removal of that drain later. The left drain was removed. You can remove the bandage on Saturday, June 11 (6/11). You will continue your home medications as prescribed.  You will follow-up in our team's clinic for your hospital follow-up where more details will be discussed about upcoming appointments and important labs/imaging.  We will help arrange physical and occupational therapy that you can be driven to. Please continue to drink plenty of water at home for your kidneys.   Signed: Elita Quick, Medical Student 02/02/2021, 11:39 AM

## 2021-02-02 NOTE — Discharge Instructions (Signed)
[name], it was a pleasure to meet you and help treat you during your hospitalization here at [facility]. While you were here, we treated you for an infection of your left kidney as well as a kidney stone in your right kidney. Instructions outlined below: You wll continue antibiotics (Bactrim) until June 20 (6/20) to clear the infection. You will be leaving with the drain on your right side remaining in place. You will follow-up with Urology for removal of that drain later. The left drain was removed. You can remove the bandage on Saturday, June 11 (6/11). You will continue your home medications as prescribed.  You will follow-up in our team's clinic for your hospital follow-up where more details will be discussed about upcoming appointments and important labs/imaging.  We will help arrange physical and occupational therapy that you can be driven to. Please continue to drink plenty of water at home for your kidneys.

## 2021-02-03 ENCOUNTER — Telehealth: Payer: Self-pay | Admitting: Internal Medicine

## 2021-02-03 ENCOUNTER — Telehealth: Payer: Self-pay

## 2021-02-03 NOTE — Telephone Encounter (Signed)
Pt. Asking if Dr. Alvis Lemmings would order him an Art gallery manager.Please advise pt.

## 2021-02-03 NOTE — Telephone Encounter (Signed)
Patient D/C 02/02/2021 on Dr. Donnetta Hutching Service and states he is needing the following Medications Refilled  nicotine 7 mg/24hr patch Commonly known as: NICODERM CQ - dosed in mg/24 hr Place 1 patch (7 mg total) onto the skin daily at 6 (six) AM. Start taking on: February 03, 2021 Replaces: nicotine 21 mg/24hr patch    Spiriva Respimat 2.5 MCG/ACT Aers Generic drug: Tiotropium Bromide Monohydrate INHALE 2 PUFFS BY MOUTH DAILY What changed: how much to take how to take this when to take this    sulfamethoxazole-trimethoprim 800-160 MG tablet Commonly known as: BACTRIM DS Take 1 tablet by mouth every 12 (twelve) hours for 11 days.    tamsulosin 0.4 MG Caps capsule Commonly known as: FLOMAX Take 1 capsule (0.4 mg total) by mouth daily. Start taking on: February 03, 2021    Vitamin D (Ergocalciferol) 1.25 MG (50000 UNIT) Caps capsule Commonly known as: DRISDOL TAKE 1 CAPSULE BY MOUTH 1 TIME A WEEK What changed: how much to take how to take this when to take this additional instructions     acetaminophen 325 MG tablet Commonly known as: TYLENOL Take 650 mg by mouth every 6 (six) hours as needed for mild pain or headache.    albuterol 108 (90 Base) MCG/ACT inhaler Commonly known as: VENTOLIN HFA Inhale 2 puffs into the lungs every 6 (six) hours as needed for wheezing or shortness of breath.    Dulcolax 5 MG EC tablet Generic drug: bisacodyl Take 20 mg by mouth daily as needed for moderate constipation.    Eliquis 5 MG Tabs tablet Generic drug: apixaban Take 1 tablet (5 mg total) by mouth 2 (two) times daily.    hydrOXYzine 10 MG tablet Commonly known as: ATARAX/VISTARIL Take 1 tablet (10 mg total) by mouth 3 (three) times daily as needed for itching.    metoprolol tartrate 25 MG tablet Commonly known as: LOPRESSOR TAKE 1 TABLET(25 MG) BY MOUTH TWICE DAILY What changed: how much to take how to take this when to take this additional instructions    [facility]  [facility] (Ph: [phone])

## 2021-02-03 NOTE — Telephone Encounter (Signed)
There are refills on his medications - called pt to let him know to call [facility] but no answer and unable to leave a message.

## 2021-02-03 NOTE — Telephone Encounter (Signed)
Transition Care Management Unsuccessful Follow-up Telephone Call  Date of discharge and from where: [facility] on 02/02/2021  Attempts:  1st Attempt  Reason for unsuccessful TCM follow-up call:  called pt [phone] x [phone]  unable to reach or leave VM.   Pt needs to schedule an appt for hospital f/u.

## 2021-02-03 NOTE — Telephone Encounter (Signed)
RTC, patient states he does not need any RX's and what he called about was "the drain sticking out of my side".  He states he cannot remember what doctor is supposed to remove his drain and when they are supposed to remove it.  Per his IP discharge summary, it is noted that he is to follow up with Urology for removal of the drain on his right side and [facility] is to call the patient for a 2-3 week appt. RN provided patient with the number for [facility] and encouraged him to call if he had any questions regarding the drain, he verbalized understanding and denies any further needs. Will forward to Dr. Jerold Coombe. Barbaraann Faster who was on the inpatient team. Thank you, SChaplin, RN,BSN

## 2021-02-06 ENCOUNTER — Telehealth: Payer: Self-pay

## 2021-02-06 NOTE — Telephone Encounter (Signed)
I have read he did at his last visit with me in 11/2020.  Please follow-up with case manager regarding progress.  Thank you

## 2021-02-06 NOTE — Telephone Encounter (Signed)
Transition Care Management Unsuccessful Follow-up Telephone Call   Date of discharge and from where: [facility] on 02/02/2021   Attempts:  2 nd Attempt   Reason for unsuccessful TCM follow-up call:  called pt at [phone]  [facility] room 204 unable to reach or leave VM.   Pt needs to schedule an appt for hospital f/u.

## 2021-02-07 ENCOUNTER — Telehealth: Payer: Self-pay

## 2021-02-07 NOTE — Telephone Encounter (Signed)
Transition Care Management Unsuccessful Follow-up Telephone Call  Date of discharge and from where:  02/02/2021, [facility]   Attempts:  3rd Attempt  Reason for unsuccessful TCM follow-up call:  Unable to leave message - patient currently staying at the [facility]. Call placed to his room # [phone] x [phone] as well as the operator, no answers and no option to leave a message with room or the operator.   Need to discuss request for power chair as well as hospital follow up.  Patient has appointment with PCP 03/01/2021 and also with Internal Medicine 02/09/2021.

## 2021-02-07 NOTE — Telephone Encounter (Signed)
Message sent to [facility] informing her of the order for power chair that was placed in Epic. Requesting she contact this CM if she needs additional information to start the approval process for the power chair.

## 2021-02-09 ENCOUNTER — Ambulatory Visit (INDEPENDENT_AMBULATORY_CARE_PROVIDER_SITE_OTHER): Payer: Medicare Other | Admitting: Internal Medicine

## 2021-02-09 ENCOUNTER — Encounter: Payer: Self-pay | Admitting: Internal Medicine

## 2021-02-09 ENCOUNTER — Inpatient Hospital Stay (HOSPITAL_COMMUNITY)
Admission: AD | Admit: 2021-02-09 | Discharge: 2021-02-12 | DRG: 312 | Disposition: A | Discharge status: Home / Self Care | Payer: Medicare Other | Source: Ambulatory Visit | Attending: Internal Medicine | Admitting: Internal Medicine

## 2021-02-09 VITALS — BP 111/62 | HR 85 | Temp 98.0°F | Wt 174.0 lb

## 2021-02-09 DIAGNOSIS — Z7901 Long term (current) use of anticoagulants: Secondary | ICD-10-CM

## 2021-02-09 DIAGNOSIS — I9589 Other hypotension: Secondary | ICD-10-CM

## 2021-02-09 DIAGNOSIS — N1831 Chronic kidney disease, stage 3a: Secondary | ICD-10-CM | POA: Diagnosis present

## 2021-02-09 DIAGNOSIS — N189 Chronic kidney disease, unspecified: Secondary | ICD-10-CM | POA: Diagnosis not present

## 2021-02-09 DIAGNOSIS — R262 Difficulty in walking, not elsewhere classified: Secondary | ICD-10-CM | POA: Diagnosis present

## 2021-02-09 DIAGNOSIS — E861 Hypovolemia: Secondary | ICD-10-CM | POA: Diagnosis present

## 2021-02-09 DIAGNOSIS — R319 Hematuria, unspecified: Secondary | ICD-10-CM | POA: Diagnosis not present

## 2021-02-09 DIAGNOSIS — I5032 Chronic diastolic (congestive) heart failure: Secondary | ICD-10-CM | POA: Diagnosis present

## 2021-02-09 DIAGNOSIS — Z79899 Other long term (current) drug therapy: Secondary | ICD-10-CM | POA: Diagnosis not present

## 2021-02-09 DIAGNOSIS — N132 Hydronephrosis with renal and ureteral calculous obstruction: Secondary | ICD-10-CM | POA: Diagnosis not present

## 2021-02-09 DIAGNOSIS — N179 Acute kidney failure, unspecified: Secondary | ICD-10-CM | POA: Diagnosis present

## 2021-02-09 DIAGNOSIS — I951 Orthostatic hypotension: Principal | ICD-10-CM | POA: Diagnosis present

## 2021-02-09 DIAGNOSIS — R310 Gross hematuria: Secondary | ICD-10-CM | POA: Diagnosis present

## 2021-02-09 DIAGNOSIS — E875 Hyperkalemia: Secondary | ICD-10-CM | POA: Diagnosis present

## 2021-02-09 DIAGNOSIS — K5900 Constipation, unspecified: Secondary | ICD-10-CM | POA: Diagnosis not present

## 2021-02-09 DIAGNOSIS — I959 Hypotension, unspecified: Secondary | ICD-10-CM | POA: Diagnosis present

## 2021-02-09 DIAGNOSIS — E876 Hypokalemia: Secondary | ICD-10-CM | POA: Diagnosis present

## 2021-02-09 DIAGNOSIS — E860 Dehydration: Secondary | ICD-10-CM | POA: Diagnosis present

## 2021-02-09 DIAGNOSIS — R3129 Other microscopic hematuria: Secondary | ICD-10-CM | POA: Diagnosis present

## 2021-02-09 DIAGNOSIS — R531 Weakness: Secondary | ICD-10-CM | POA: Diagnosis not present

## 2021-02-09 DIAGNOSIS — F17210 Nicotine dependence, cigarettes, uncomplicated: Secondary | ICD-10-CM | POA: Diagnosis present

## 2021-02-09 DIAGNOSIS — W19XXXA Unspecified fall, initial encounter: Secondary | ICD-10-CM | POA: Insufficient documentation

## 2021-02-09 DIAGNOSIS — R55 Syncope and collapse: Secondary | ICD-10-CM | POA: Diagnosis not present

## 2021-02-09 DIAGNOSIS — E559 Vitamin D deficiency, unspecified: Secondary | ICD-10-CM | POA: Diagnosis present

## 2021-02-09 DIAGNOSIS — J449 Chronic obstructive pulmonary disease, unspecified: Secondary | ICD-10-CM | POA: Diagnosis present

## 2021-02-09 DIAGNOSIS — I4819 Other persistent atrial fibrillation: Secondary | ICD-10-CM | POA: Diagnosis present

## 2021-02-09 DIAGNOSIS — N200 Calculus of kidney: Secondary | ICD-10-CM | POA: Diagnosis present

## 2021-02-09 DIAGNOSIS — Z66 Do not resuscitate: Secondary | ICD-10-CM | POA: Diagnosis present

## 2021-02-09 DIAGNOSIS — D509 Iron deficiency anemia, unspecified: Secondary | ICD-10-CM | POA: Diagnosis present

## 2021-02-09 DIAGNOSIS — S32001A Stable burst fracture of unspecified lumbar vertebra, initial encounter for closed fracture: Secondary | ICD-10-CM

## 2021-02-09 DIAGNOSIS — Z720 Tobacco use: Secondary | ICD-10-CM | POA: Diagnosis not present

## 2021-02-09 DIAGNOSIS — E8809 Other disorders of plasma-protein metabolism, not elsewhere classified: Secondary | ICD-10-CM | POA: Diagnosis present

## 2021-02-09 DIAGNOSIS — N19 Unspecified kidney failure: Secondary | ICD-10-CM

## 2021-02-09 LAB — CBC WITH DIFFERENTIAL/PLATELET
Abs Immature Granulocytes: 0.08 10*3/uL — ABNORMAL HIGH (ref 0.00–0.07)
Basophils Absolute: 0.1 10*3/uL (ref 0.0–0.1)
Basophils Relative: 1 %
Eosinophils Absolute: 0.1 10*3/uL (ref 0.0–0.5)
Eosinophils Relative: 2 %
HCT: 28.7 % — ABNORMAL LOW (ref 39.0–52.0)
Hemoglobin: 8.8 g/dL — ABNORMAL LOW (ref 13.0–17.0)
Immature Granulocytes: 1 %
Lymphocytes Relative: 12 %
Lymphs Abs: 0.8 10*3/uL (ref 0.7–4.0)
MCH: 25.7 pg — ABNORMAL LOW (ref 26.0–34.0)
MCHC: 30.7 g/dL (ref 30.0–36.0)
MCV: 83.7 fL (ref 80.0–100.0)
Monocytes Absolute: 0.4 10*3/uL (ref 0.1–1.0)
Monocytes Relative: 6 %
Neutro Abs: 5.5 10*3/uL (ref 1.7–7.7)
Neutrophils Relative %: 78 %
Platelets: 278 10*3/uL (ref 150–400)
RBC: 3.43 MIL/uL — ABNORMAL LOW (ref 4.22–5.81)
RDW: 18.6 % — ABNORMAL HIGH (ref 11.5–15.5)
WBC: 7.1 10*3/uL (ref 4.0–10.5)
nRBC: 0 % (ref 0.0–0.2)

## 2021-02-09 LAB — URINALYSIS, ROUTINE W REFLEX MICROSCOPIC
Bilirubin Urine: NEGATIVE
Glucose, UA: NEGATIVE mg/dL
Ketones, ur: NEGATIVE mg/dL
Nitrite: NEGATIVE
Protein, ur: 100 mg/dL — AB
RBC / HPF: >50 RBC/hpf — ABNORMAL HIGH (ref 0–5)
Specific Gravity, Urine: 1.009 (ref 1.005–1.030)
WBC, UA: >50 WBC/hpf — ABNORMAL HIGH (ref 0–5)
pH: 6 (ref 5.0–8.0)

## 2021-02-09 LAB — BASIC METABOLIC PANEL
Anion gap: 10 (ref 5–15)
BUN: 49 mg/dL — ABNORMAL HIGH (ref 8–23)
CO2: 17 mmol/L — ABNORMAL LOW (ref 22–32)
Calcium: 8.5 mg/dL — ABNORMAL LOW (ref 8.9–10.3)
Chloride: 107 mmol/L (ref 98–111)
Creatinine, Ser: 3.61 mg/dL — ABNORMAL HIGH (ref 0.61–1.24)
GFR, Estimated: 16 mL/min — ABNORMAL LOW (ref >60)
Glucose, Bld: 92 mg/dL (ref 70–99)
Potassium: 5.3 mmol/L — ABNORMAL HIGH (ref 3.5–5.1)
Sodium: 134 mmol/L — ABNORMAL LOW (ref 135–145)

## 2021-02-09 LAB — LACTIC ACID, PLASMA
Lactic Acid, Venous: 0.9 mmol/L (ref 0.5–1.9)
Lactic Acid, Venous: 1.6 mmol/L (ref 0.5–1.9)

## 2021-02-09 LAB — SARS CORONAVIRUS 2 (TAT 6-24 HRS): SARS Coronavirus 2: NEGATIVE

## 2021-02-09 MED ORDER — SODIUM CHLORIDE 0.9 % IV BOLUS
1000.0000 mL | Freq: Once | INTRAVENOUS | Status: AC
  Administered 2021-02-09: 1000 mL via INTRAVENOUS

## 2021-02-09 MED ORDER — LACTATED RINGERS IV SOLN: INTRAVENOUS | Status: AC

## 2021-02-09 MED ORDER — ACETAMINOPHEN 650 MG RE SUPP: 650.0000 mg | Freq: Four times a day (QID) | RECTAL | Status: DC | PRN

## 2021-02-09 MED ORDER — ONDANSETRON HCL 4 MG PO TABS: 4.0000 mg | ORAL_TABLET | Freq: Four times a day (QID) | ORAL | Status: DC | PRN

## 2021-02-09 MED ORDER — ONDANSETRON HCL 4 MG/2ML IJ SOLN
4.0000 mg | Freq: Four times a day (QID) | INTRAMUSCULAR | Status: DC | PRN
Start: 2021-02-09 — End: 2021-02-12

## 2021-02-09 MED ORDER — SODIUM CHLORIDE 0.9 % IV SOLN: INTRAVENOUS | Status: AC

## 2021-02-09 MED ORDER — POLYETHYLENE GLYCOL 3350 17 G PO PACK
17.0000 g | PACK | Freq: Every day | ORAL | Status: DC
  Administered 2021-02-09 – 2021-02-12 (×4): 17 g via ORAL
  Filled 2021-02-09 (×3): qty 1

## 2021-02-09 MED ORDER — ALBUTEROL SULFATE HFA 108 (90 BASE) MCG/ACT IN AERS
2.0000 | INHALATION_SPRAY | Freq: Four times a day (QID) | RESPIRATORY_TRACT | Status: DC | PRN
  Filled 2021-02-09: qty 6.7

## 2021-02-09 MED ORDER — APIXABAN 5 MG PO TABS
5.0000 mg | ORAL_TABLET | Freq: Two times a day (BID) | ORAL | Status: DC
  Administered 2021-02-09: 5 mg via ORAL
  Filled 2021-02-09: qty 1

## 2021-02-09 MED ORDER — ACETAMINOPHEN 325 MG PO TABS
650.0000 mg | ORAL_TABLET | Freq: Four times a day (QID) | ORAL | Status: DC | PRN
  Administered 2021-02-10 – 2021-02-11 (×4): 650 mg via ORAL
  Filled 2021-02-09 (×4): qty 2

## 2021-02-09 MED ORDER — UMECLIDINIUM BROMIDE 62.5 MCG/INH IN AEPB
1.0000 | INHALATION_SPRAY | Freq: Every day | RESPIRATORY_TRACT | Status: DC
  Administered 2021-02-12: 1 via RESPIRATORY_TRACT
  Filled 2021-02-09 (×2): qty 7

## 2021-02-09 MED ORDER — TIOTROPIUM BROMIDE MONOHYDRATE 2.5 MCG/ACT IN AERS: INHALATION_SPRAY | Freq: Every day | RESPIRATORY_TRACT | Status: DC

## 2021-02-09 NOTE — Progress Notes (Signed)
Assisted patient to side of bed, patient standing without difficulty.  Voided of dark, concentrated urine in urinal.  Assisted back to bed, alarms on. SChaplin, RN,BSN

## 2021-02-09 NOTE — Assessment & Plan Note (Signed)
See syncope and collapse

## 2021-02-09 NOTE — Progress Notes (Signed)
   CC: Hospital Follow Up  HPI:  Mr.Kuba Judie Petit [name] is a 85 y.o. person, with a PMH noted below, who presents to the clinic for hospital follow up. To see the management of their acute and chronic conditions, please see the A&P note under the Encounters tab.   Past Medical History:  Diagnosis Date   Acute heart failure with preserved ejection fraction (HCC)    Atrial fibrillation with rapid ventricular response (HCC) 07/11/2019   Review of Systems:   Review of Systems  Constitutional:  Positive for malaise/fatigue. Negative for chills, diaphoresis and fever.  Respiratory:  Negative for cough.   Cardiovascular:  Negative for chest pain, palpitations and orthopnea.  Gastrointestinal:  Negative for abdominal pain, diarrhea, nausea and vomiting.  Genitourinary:  Positive for hematuria. Negative for dysuria, flank pain, frequency and urgency.  Musculoskeletal:  Positive for back pain and falls.  Skin:  Negative for itching and rash.  Neurological:  Positive for dizziness, weakness and headaches. Negative for tingling, seizures and loss of consciousness.  Endo/Heme/Allergies:  Bruises/bleeds easily.    Physical Exam:  Vitals:   02/09/21 0905 02/09/21 0907  BP: (!) 78/46 (!) 86/34  Pulse: 91 76  Temp: 98 F (36.7 C)   TempSrc: Oral   SpO2: 99%   Weight: 174 lb (78.9 kg)    Physical Exam Constitutional:      Appearance: He is ill-appearing.     Comments: Chronically ill appearing, unkempt, answers questions appropriately.  HENT:     Head: Normocephalic and atraumatic.  Cardiovascular:     Rate and Rhythm: Rhythm irregular.     Heart sounds: Normal heart sounds. No murmur heard.   No friction rub. No gallop.     Comments: Irregularly irregular rhythm Pulmonary:     Effort: Pulmonary effort is normal. No respiratory distress.     Breath sounds: Normal breath sounds. No wheezing.  Abdominal:     General: Abdomen is flat. Bowel sounds are normal.     Palpations: Abdomen is  soft.     Tenderness: There is no abdominal tenderness.  Genitourinary:    Comments: Drainage pouch with concentrated sediment.  Musculoskeletal:     Right lower leg: No edema.     Left lower leg: No edema.  Skin:    General: Skin is dry.     Coloration: Skin is not jaundiced or pale.     Findings: Bruising present. No erythema, lesion or rash.     Comments: Multiple ecchymosis appreciated on the bilateral UE. Poor skin turgor.   Neurological:     Mental Status: He is alert and oriented to person, place, and time.     Assessment & Plan:   See Encounters Tab for problem based charting.  Patient discussed with Dr. Heide Spark

## 2021-02-09 NOTE — Hospital Course (Addendum)
Holding Eliquis  Patient reports he was getting checked on regularly at home and does not regret going home instead of to SNF. He did experience some dizziness, but not experiencing it at rest. He ask only to be in hospital for 2 to 3 days. He wants quality days at his motel where his friends are.

## 2021-02-09 NOTE — Progress Notes (Signed)
Pt arrived to unit in wheelchair accompanied by staff nurse from internal medicine.Pt situated/orientated to room/equipments,welcome guide/menu provided with instructions,Pt verbalized understanding of instructions. Hospital  valuables policy has been discussed with no complaints. Bed in lowest position with 3 side rails up ,call bell/room phone within reach and all wheels locked.

## 2021-02-09 NOTE — Assessment & Plan Note (Addendum)
Continued Bactrim, drainage from pouch is concentrated, will obtain urinalysis to assess for hematuria.  - U/A - Urine Culture

## 2021-02-09 NOTE — Assessment & Plan Note (Addendum)
Patient appears in the office with multiple episodes of dizziness and collapse. Per his hotel neighbor, he has not been been eating or drinking since his discharge. He additionally notes that his symptoms are worse when he is getting up to stand from bed or chairs. He requires assistance getting back up from his falls. Denies hitting head or LOC with his falls. He is additionally on anticoagulation for his atrial fibrillation.   His symptoms are likely orthostatic 2/2 to decrease PO intake. He felt dizzy just moving from wheelchair to bed when he stood up during the interview. Has decreased skin turgor. Will start IV hydration.   Given his Hx of L1 burst fracture, hypovolemic state, Eliquis use, and multiple falls, will admit to inpatient for IV hydration, PT/OT, and disposition as his current living situation is tenuous.

## 2021-02-09 NOTE — Assessment & Plan Note (Signed)
Patient presents with BP of 78/46 and 86/34. He is accompanied by Ms. Danford Bad, a neighbor at his hotel. She notes that [name] has not been eating or drinking fluids since his discharge.   She also notes that he has been having dizzy spells when he gets up from bed, and has had several falls associated with dizzy spells.   A/P:  Patient presents to the clinic for HFU. Since his discharge, he has been mostly bed bound, his PO intake has been poor. He appears hypovolemic on physical examination, with poor skin turgor. His hypotension is likely 2/2 to poor PO intake. Will start fluid resuscitation in the office.  - 1000 mL NaCl bolus - BMP

## 2021-02-09 NOTE — Assessment & Plan Note (Deleted)
Continue current regimen of Bactrim

## 2021-02-09 NOTE — H&P (Signed)
Date: 02/09/2021               Patient Name:  Alfred Mercado MRN: [id number]  DOB: Dec 14, 1935 Age / Sex: 85 y.o., male   PCP: Hoy Register, MD         Medical Service: Internal Medicine Teaching Service         Attending Physician: Dr. Mayford Knife, Dorene Ar, MD    First Contact: Alden Server, MS4 Pager: [phone]  Second Contact: Dr. Barbaraann Faster Pager: [phone]       After Hours (After 5p/  First Contact Pager: [phone]  weekends / holidays): Second Contact Pager: [phone]   Chief Complaint: Light-headedness  History of Present Illness:  AlfredMercado is an 85 yo M w/ PMH of HFpEF, A.fib, tobacco use, COPD, CKD3a, recent admission for complicated UTI presenting to [facility] with complaint of light-headedness. He was recently discharged from [facility] on 02/02/21 after being diagnosed with complicated UTI with left pyelonephritic abscess with oral Bactrim and wound drain. He mentions that since discharge, he has been continuing to endorse fatigue, weakness, light-headedness with exertion. He has continued to notice dark bloody drainage from his wound drain but denies any change in quality of volume of the drainage. He mentions that he did not receive a visit or phone call for his home PT/OT but mentions that he may have missed due to his poor hearing. He does mentions that he has been endorsing progressive weakness with ambulation and has needed to rely on his neighbors to help him get around.  On review of systems, he denies any fevers, chills, nausea, vomiting, chest pain, diarrhea, or dyspnea. Does endorse constipation (last bowel movement 3 days prior) He denies any dysuria, frequency, urgency. Denies any focal weakness, blurry vision, numbness or tingling.  Chart review shows recent admission for complicated UTI treated with IV cefepime and d/ced with oral Bactrim to complete 14 day course. He was found to be significantly deconditioned at the time and was recommended for SNF admission but patient  declined per preference. When asked about his willingness to reconsider today, he mentions concern regarding billing and financial barriers.  Meds:  Bactrim 800-160mg  BID Hydroxyzine 10mg  TID PRN Tamsulosin 0.4mg  y Dulcolax 20mg  daily Vitamin D 1.25mg  weekly Apixaban 5mg  BID   Allergies: Allergies as of 02/09/2021   (No Known Allergies)   Past Medical History:  Diagnosis Date   Acute heart failure with preserved ejection fraction (HCC)    Atrial fibrillation with rapid ventricular response (HCC) 07/11/2019   Family History:  Denies any clinically significant family history  Social History: Retired from . Currently lives in a motel. Friendly with neighbors. Currently smoking 3-4 cigarettes daily. Denies alcohol, illicit substance use.  Review of Systems: A complete ROS was negative except as per HPI.  Physical Exam: BP: (!) 78/46 (!) 86/34  Pulse: 91 76  Temp: 98 F (36.7 C)    TempSrc: Oral    SpO2: 99%    Weight: 174 lb (78.9 kg)    Gen: Well-developed, chronically ill-appearing, NAD HEENT: NCAT head, very poor hearing, EOMI, Dry mucous membrane Neck: supple, ROM intact, no JVD CV: RRR, S1, S2 normal, No rubs, no murmurs, no gallops Pulm: CTAB, No rales, mild expiratory wheezing Abd: Soft, BS+, NTND, No rebound, no guarding Extm: ROM intact, Peripheral pulses intact, No peripheral edema Skin: Dry, warm, pool turgor, R flank wound drain in place and functioning with dark red fluid, mild tenderness to palpation around w/o surrounding erythema, edema  Neuro: AAOx3  EKG: pending  CXR: N/A  Assessment & Plan by Problem: Active Problem: Hypotension Hypokalemia AKI  AlfredMercado is an 85 yo M w/ PMH of HFpEF, A.fib, tobacco use, COPD, CKD3a, recent admission for complicated UTI presenting to [facility] with complaint of light-headedness  Hypotension, Malaise, Fatigue 2/2 dehydration / decontioning Recent Complicated UTI Admit bp 78/46. Recently admit for  complicated UTI with enterobacter cloacae. PT/OT rec SNF but patient refused. Light-headed, + orthostatic hypotension, has difficulty walking in clinic. Also appear very dry on exam. Getting fluids. Appropriate for admission for further eval. Will likely need to be d/ced to SNF. No indication to restart abx with no leukocytosis, afebrile. Already treated with 7 days of cefepime, 10 days of Bactrim - D/c Bactrim - F/u repeat UA/Culture - Lactate - Trend cbc - LR bolus given in clinic - Gentle fluid resuscitation - PT/OT eval  Hyperkalemia Acute Kidney Injury on CKD3a 2/2 trimethoprim/sulbactam Baseline renal fx Bun 20, Creatinine 1.4. S/p 10 days of Bactrim. K 5.3, Creatinine 3.61.  - D/c Bactrim - EKG - Trend renal fx - Avoid nephrotoxic meds when able  Iron deficiency Anemia Baseline hgb 10-11. Received iron infusion on prior admissionCurrent hgb 8.8 - Trend cbc - Transfusion as needed to keep hgb >7  Chronic diastolic heart failure Has known stable R pleural effusion. Appear hypovolemic - Hold home beta-blocker in setting of hypotension  Persistent A.Fib On metoprolol, eliquis at home. CHADVASC2 score of 4 - F/u EKG - C/w home med: eliquis 5mg  BID  COPD Mild wheezing on exam C/w home meds: spiriva daily, albuterol prn  DVT prophx: Eliquis Diet: Renal Bowel: Miralax Code: DNR  Prior to Admission Living Arrangement: Home Anticipated Discharge Location: SNF Barriers to Discharge: Medical tx  Dispo: Admit patient to Inpatient with expected length of stay greater than 2 midnights.  Signed: , MD 02/09/2021, 3:04 PM Pager: [phone] After 5pm on weekdays and 1pm on weekends: On Call Pager: [phone]

## 2021-02-09 NOTE — Addendum Note (Signed)
Addended by: Angelina Ok F on: 02/09/2021 02:57 PM   Modules accepted: Orders

## 2021-02-09 NOTE — Assessment & Plan Note (Addendum)
Patient presents to the clinic for follow up on his closed burst fracture of L1. Since his hospital stay, he has not had any contact with PT and OT. During his last hospital stay, he refused SNF. He has not been wearing his back brace, he has had several falls at home requiring assistance of hotel neighbors to help him get up. He has hence stayed mostly confined to his bed due to his frequent falls.   A/P:  Patient living in a hotel room after having a burst fracture of L1. He has had additional falls since his hospital stay, is not wearing his back brace, and has been mostly combined to his bed. Given his falls with known fracture and noncompliance with brace, he will be admitted for further evaluation. He likely needs SNF, and would recommend further discussion with patient about safety of his current housing situation vs SNF for continued strengthening, PT/OT.  - Consider CM and social work consults

## 2021-02-10 DIAGNOSIS — N132 Hydronephrosis with renal and ureteral calculous obstruction: Secondary | ICD-10-CM

## 2021-02-10 DIAGNOSIS — N1831 Chronic kidney disease, stage 3a: Secondary | ICD-10-CM

## 2021-02-10 DIAGNOSIS — Z720 Tobacco use: Secondary | ICD-10-CM

## 2021-02-10 DIAGNOSIS — I959 Hypotension, unspecified: Secondary | ICD-10-CM

## 2021-02-10 DIAGNOSIS — R319 Hematuria, unspecified: Secondary | ICD-10-CM

## 2021-02-10 DIAGNOSIS — J449 Chronic obstructive pulmonary disease, unspecified: Secondary | ICD-10-CM

## 2021-02-10 DIAGNOSIS — D509 Iron deficiency anemia, unspecified: Secondary | ICD-10-CM

## 2021-02-10 DIAGNOSIS — K5900 Constipation, unspecified: Secondary | ICD-10-CM

## 2021-02-10 LAB — COMPREHENSIVE METABOLIC PANEL
ALT: 9 U/L (ref 0–44)
AST: 12 U/L — ABNORMAL LOW (ref 15–41)
Albumin: 2.5 g/dL — ABNORMAL LOW (ref 3.5–5.0)
Alkaline Phosphatase: 77 U/L (ref 38–126)
Anion gap: 9 (ref 5–15)
BUN: 44 mg/dL — ABNORMAL HIGH (ref 8–23)
CO2: 17 mmol/L — ABNORMAL LOW (ref 22–32)
Calcium: 8.3 mg/dL — ABNORMAL LOW (ref 8.9–10.3)
Chloride: 110 mmol/L (ref 98–111)
Creatinine, Ser: 3.1 mg/dL — ABNORMAL HIGH (ref 0.61–1.24)
GFR, Estimated: 19 mL/min — ABNORMAL LOW (ref >60)
Glucose, Bld: 95 mg/dL (ref 70–99)
Potassium: 4.9 mmol/L (ref 3.5–5.1)
Sodium: 136 mmol/L (ref 135–145)
Total Bilirubin: 0.6 mg/dL (ref 0.3–1.2)
Total Protein: 5.2 g/dL — ABNORMAL LOW (ref 6.5–8.1)

## 2021-02-10 LAB — CBC WITH DIFFERENTIAL/PLATELET
Abs Immature Granulocytes: 0.03 10*3/uL (ref 0.00–0.07)
Basophils Absolute: 0.1 10*3/uL (ref 0.0–0.1)
Basophils Relative: 2 %
Eosinophils Absolute: 0.1 10*3/uL (ref 0.0–0.5)
Eosinophils Relative: 2 %
HCT: 24.6 % — ABNORMAL LOW (ref 39.0–52.0)
Hemoglobin: 7.8 g/dL — ABNORMAL LOW (ref 13.0–17.0)
Immature Granulocytes: 1 %
Lymphocytes Relative: 19 %
Lymphs Abs: 0.9 10*3/uL (ref 0.7–4.0)
MCH: 25.7 pg — ABNORMAL LOW (ref 26.0–34.0)
MCHC: 31.7 g/dL (ref 30.0–36.0)
MCV: 81.2 fL (ref 80.0–100.0)
Monocytes Absolute: 0.4 10*3/uL (ref 0.1–1.0)
Monocytes Relative: 8 %
Neutro Abs: 3.2 10*3/uL (ref 1.7–7.7)
Neutrophils Relative %: 68 %
Platelets: 235 10*3/uL (ref 150–400)
RBC: 3.03 MIL/uL — ABNORMAL LOW (ref 4.22–5.81)
RDW: 18.6 % — ABNORMAL HIGH (ref 11.5–15.5)
WBC: 4.6 10*3/uL (ref 4.0–10.5)
nRBC: 0 % (ref 0.0–0.2)

## 2021-02-10 MED ORDER — LACTATED RINGERS IV SOLN: INTRAVENOUS | Status: DC

## 2021-02-10 MED ORDER — LACTATED RINGERS IV SOLN: INTRAVENOUS | Status: AC

## 2021-02-10 NOTE — Progress Notes (Signed)
Attending MD made aware of bright red urine. Hold eliquis as ordered. Pt remains alert in no apparent distress. Closely monitored.

## 2021-02-10 NOTE — Progress Notes (Signed)
Subjective: No acute events overnight.  [name] is doing well this morning with no new acute complaints. He appeared happy to see the same medical team. He is open to getting better, but would like to leave by Sunday. At this time, he would like to return to his motel. He reported taking his medicines when discharged on June 9. He did not take his Lasix or Diltiazem, as instructed. He was taken care of at his motel.   Objective:  Vital signs in last 24 hours: Vitals:   02/09/21 1700 02/09/21 2256 02/10/21 0554 02/10/21 0828  BP: 115/68 107/62 101/63 (!) 109/59  Pulse: 88 83 88 91  Resp: 14   16  Temp: 97.7 F (36.5 C) 97.7 F (36.5 C) 97.9 F (36.6 C) (!) 97.5 F (36.4 C)  TempSrc: Oral Oral Oral Oral  SpO2: 100% 100% 98% 97%   Weight change:   Intake/Output Summary (Last 24 hours) at 02/10/2021 1422 Last data filed at 02/10/2021 1320 Gross per 24 hour  Intake --  Output 1075 ml  Net -1075 ml   Physical Exam Vitals and nursing note reviewed.  Constitutional:      Comments: Elderly male resting in bed, alert and conversational, not in distress  Eyes:     Conjunctiva/sclera: Conjunctivae normal.  Cardiovascular:     Rate and Rhythm: Normal rate. Rhythm irregular.     Heart sounds: No murmur heard.    Comments: Irregularly irregular rhythm  Pulmonary:     Effort: Pulmonary effort is normal.     Comments: Anterior lung fields clear Abdominal:     General: Abdomen is flat. There is no distension.     Palpations: Abdomen is soft. There is no mass.     Tenderness: There is no abdominal tenderness.  Genitourinary:    Comments: R nephrostomy drain in place, draining dark red urine Skin:    General: Skin is warm and dry.     Comments: Decreased turgor or skin on lower extremities  Ecchymoses on bilateral UE   Assessment/Plan:  Active Problems:   Hypotension  This is hospital day #1 for [name], an 85 year old man with history of HFpEF (06/2019 EF  50-55%) with associated right pleural effusion s/p thoracentesis, atrial fibrillation on Eliquis, heavy tobacco use, COPD, and vitamin D deficiency who returns to the hospital with dizziness and hypotension after recent discharge on June 9 for pyelonephritic abscess.   #Hypotension 2/2 hypovolemia, improving  Yesterday afternoon, he was seen in [facility] with some dizziness and low blood pressure. Per neighbor who accompanied him, he's had decreased PO intake since discharge. - s/p 1L bolus yesterday - BP 86/34 --> 109/59 most recently  - Cont LR @ 135mL/hr   #Complicated UTI with left pyelonephritis abscess, resolved Recently admitted for abscess that was drained by VIR. JP bulb drain remained in place for about one week until removed by VIR once stopped draining pus. He took bactrim upon discharge and completed about 2.5 weeks of antibiotics (Uro requested 2-3 weeks). - UA: >50 WBC, negative nitrites - Urine culture: 10k colonies of Staph epi, susceptibilities to follow - Lactate and WBC: normal    #Right staghorn renal calculus #Microscopic and macroscopic hematuria  During last admission, R nephrostomy tube placed by VIR/Urology for R staghorn calculus. He drained sedimentary dark fluid until a few days prior to discharge, when he drained more normal appearing urine. Urology planned to keep tube in place until outpatient follow up. - Urology to see  him outpatient  - UA: >50 RBC, large Hgb, grossly bloody today on exam  - Cont R percutaneous nephrostomy tube - Hold Flomax 0.4mg  daily for now given low BP     #AKI on CKD IIIa On 6/9, Cr 1.8 after resolution of AKI on CKD. On admission now, new AKI with a Cr of 3.10, BUN of 44, and GFR of 19. This is likely pre-renal given low volume status and reported decreased PO intake. However, a type 4 RTA is also possible/playing a role given hyperkalemia on admission with NAGMA.  - Cont LR @ 16mL/hr  - BMP tomorrow   #Iron deficiency anemia,  stable Baseline Hgb 10-11. He received IV iron during last admission. He was discharge with a Hgb of 8.3 which was steady and stable.  - Hgb 8.8 > 7.8 - Consider more iron during this admission (low MCV, high RDW) - CBC tomorrow    #HFpEF, stable #Right pleural effusion, stable No evidence of volume overload at this time or HF exacerbation. He continued metoprolol at home but did not continue Lasix or diltiazem.  - Hold metoprolol, lasix, and diltiazem given low BP    #Atrial fibrillation, stable Normal HR since admission. Irregularly irregular beat on exam toady (chronic problem). However, given some concern for urinary bleeding, Eliquis being held at this time.  - Hold Eliquis 5mg  BID given urinary bleeding   #COPD, stable #Tobacco use disorder, stable Yesterday, admission H&P noted expiratory wheezing. Exam limited today but no wheezing on anterior fields and no acute respiratory changes (no tachypnea, distress).  - Cont Incruse Ellipta daily and albuterol q6h PRN - Cont nicotine patch    #Constipation  No bowel movements for a few days.  - Miralax 17g PO daily   #Hypoalbuminemia Likely related to chronic illness/nutrition status. - Alb 2.5 today, mildly decreased since last admission - 100 protein in urine, up from 30 and negative previously   #L1 burst fracture Found on prior admission after several falls. Neurosurgery saw and recommended no acute interventions. They recommended f/u x-rays in one month.   - Brace as tolerated - PT/OT following   LOS: 1 day   , Medical Student 02/10/2021, 2:22 PM

## 2021-02-10 NOTE — Progress Notes (Signed)
Internal Medicine Clinic Attending ? ?Case discussed with Dr. Winters  At the time of the visit.  We reviewed the resident?s history and exam and pertinent patient test results.  I agree with the assessment, diagnosis, and plan of care documented in the resident?s note.  ?

## 2021-02-10 NOTE — Evaluation (Signed)
Occupational Therapy Evaluation Patient Details Name: Alfred Mercado MRN: [id number] DOB: [birth year] Today's Date: 02/10/2021    History of Present Illness Pt is an 85 year old man admitted on 02/09/21 with progressive weakness, lightheadedness. +hyperkalemia, anemia. PMH: L1 burst fx 01/24/21, admisssion earlier this month with UTI and L pyelonephrotic abscess with drain placement, COPD, CHF, afib, tobacco use, CKD 3a.   Clinical Impression   Pt lives in a motel with the support of his neighbors for IADL. He reports he has had falls and prefers to walk with his cane, he does not like a walker. He reports he recently received a w/c and it has been a "life saver." Limited session today. Pt initially refusing OOB, then agreed to get to chair for lunch, but once at EOB, refused again stating he wanted to take a 10 minute nap. He was unaware how to use nurse call button. Pt needs ST rehab in SNF, but is likely to refuse. Will continue to follow.     Follow Up Recommendations  SNF (pt is likely to refuse)    Equipment Recommendations       Recommendations for Other Services       Precautions / Restrictions Precautions Precautions: Fall Precaution Comments: pt reports "a few" falls recently Restrictions Other Position/Activity Restrictions: pt had a TLSO prescribed for comfort after his L1 burst fx, but does not use      Mobility Bed Mobility Overal bed mobility: Needs Assistance Bed Mobility: Supine to Sit;Sit to Supine     Supine to sit: Min assist;HOB elevated Sit to supine: Min assist   General bed mobility comments: pulled up on therapist's hand to raise trunk, min assist for LE back into bed    Transfers                 General transfer comment: pt declined    Balance Overall balance assessment: Needs assistance Sitting-balance support: Feet supported;No upper extremity supported Sitting balance-[name]: Fair                                      ADL either performed or assessed with clinical judgement   ADL Overall ADL's : Needs assistance/impaired Eating/Feeding: Set up;Bed level   Grooming: Wash/dry hands;Wash/dry face;Sitting;Supervision/safety   Upper Body Bathing: Supervision/ safety;Sitting       Upper Body Dressing : Set up;Sitting                     General ADL Comments: limited session as pt stated he wanted to get up to chair to eat lunch and later retracted stating he wanted to take a 10 minute nap     Vision Baseline Vision/History: Wears glasses Patient Visual Report: No change from baseline       Perception     Praxis      Pertinent Vitals/Pain Pain Assessment: Faces Faces Pain Scale: Hurts a little bit Pain Location: generalized Pain Descriptors / Indicators: Grimacing;Discomfort Pain Intervention(s): Monitored during session;Repositioned     Hand Dominance Right   Extremity/Trunk Assessment Upper Extremity Assessment Upper Extremity Assessment: Generalized weakness   Lower Extremity Assessment Lower Extremity Assessment: Defer to PT evaluation       Communication Communication Communication: HOH   Cognition Arousal/Alertness: Awake/alert Behavior During Therapy: Flat affect Overall Cognitive Status: Impaired/Different from baseline Area of Impairment: Safety/judgement;Problem solving;Memory  Memory: Decreased short-term memory   Safety/Judgement: Decreased awareness of safety;Decreased awareness of deficits   Problem Solving: Slow processing General Comments: pt did not recall how to use his nurse call button   General Comments       Exercises     Shoulder Instructions      Home Living Family/patient expects to be discharged to:: Private residence Living Arrangements: Alone Available Help at Discharge: Available PRN/intermittently;Neighbor Type of Home: Other(Comment) (motel) Home Access: Level entry     Home Layout: One  level     Bathroom Shower/Tub: Chief Strategy Officer: Handicapped height     Home Equipment: Cane - single point;Wheelchair - manual          Prior Functioning/Environment Level of Independence: Needs assistance  Gait / Transfers Assistance Needed: walks with a cane, but reports his w/c has been a "life saver" ADL's / Homemaking Assistance Needed: Pt reports that he was Ind with ADLs/selfcare and that neighbors help with occasional housekeeping, food brought by delivery, or friends            OT Problem List: Decreased strength;Impaired balance (sitting and/or standing);Decreased cognition;Pain;Decreased knowledge of precautions;Decreased safety awareness;Decreased activity tolerance;Decreased coordination;Decreased knowledge of use of DME or AE      OT Treatment/Interventions: Self-care/ADL training;DME and/or AE instruction;Therapeutic activities;Balance training;Therapeutic exercise;Patient/family education    OT Goals(Current goals can be found in the care plan section) Acute Rehab OT Goals Patient Stated Goal: go back home OT Goal Formulation: With patient Time For Goal Achievement: 02/24/21 Potential to Achieve Goals: Fair ADL Goals Pt Will Perform Grooming: with set-up;sitting (at sink) Pt Will Perform Lower Body Bathing: with supervision;sit to/from stand Pt Will Perform Lower Body Dressing: with supervision;sit to/from stand Pt Will Transfer to Toilet: with supervision;stand pivot transfer Pt Will Perform Toileting - Clothing Manipulation and hygiene: with supervision;sit to/from stand  OT Frequency: Min 2X/week   Barriers to D/C:            Co-evaluation              AM-PAC OT "6 Clicks" Daily Activity     Outcome Measure Help from another person eating meals?: None Help from another person taking care of personal grooming?: A Little Help from another person toileting, which includes using toliet, bedpan, or urinal?: A Lot Help from  another person bathing (including washing, rinsing, drying)?: A Lot Help from another person to put on and taking off regular upper body clothing?: A Little Help from another person to put on and taking off regular lower body clothing?: A Lot 6 Click Score: 16   End of Session    Activity Tolerance: Patient limited by fatigue Patient left: in bed;with call bell/phone within reach;with bed alarm set  OT Visit Diagnosis: Unsteadiness on feet (R26.81);Other abnormalities of gait and mobility (R26.89);Muscle weakness (generalized) (M62.81);History of falling (Z91.81);Pain;Other symptoms and signs involving cognitive function                Time: 1154-1210 OT Time Calculation (min): 16 min Charges:  OT General Charges $OT Visit: 1 Visit OT Evaluation $OT Eval Moderate Complexity: 1 Mod  Martie Round, OTR/L Acute Rehabilitation Services Pager: [phone] Office: [phone]  Evern Bio 02/10/2021, 12:27 PM

## 2021-02-10 NOTE — Plan of Care (Signed)

## 2021-02-10 NOTE — Evaluation (Signed)
Physical Therapy Evaluation Patient Details Name: Alfred Mercado MRN: [id number] DOB: [birth year] Today's Date: 02/10/2021   History of Present Illness  Pt is an 85 year old man admitted on 02/09/21 with progressive weakness, lightheadedness. +hyperkalemia, anemia. PMH: L1 burst fx 01/24/21, admisssion earlier this month with UTI and L pyelonephrotic abscess with drain placement, COPD, CHF, afib, tobacco use, CKD 3a.   Clinical Impression  Pt in bed upon arrival of PT, agreeable to evaluation at this time. Prior to admission the pt was living alone, mobilizing with use of cane or new WC, able to transfer independently from all surfaces at his home. The pt now presents with limitations in functional mobility, endurance, and dynamic stability due to above dx, and will continue to benefit from skilled PT to address these deficits. The pt was able to complete bed mobility with increased time, but no physical assist from PT at this time. He was then able to complete sit-stand and pivot with minG-minA to steady but no UE support to power up. No LOB with static stand, but pt visibly SOB after pivoting steps to recliner. Discussed importance of maintaining endurance, increasing activity at home. Recommended SNF level rehab at d/c to maximize return of strength and mobility, pt strongly declines stating preference for max HHPT.      Follow Up Recommendations SNF;Supervision for mobility/OOB (recommended SNF to pt, he strongly declines, will need max HHPT)    Equipment Recommendations  None recommended by PT    Recommendations for Other Services       Precautions / Restrictions Precautions Precautions: Fall Precaution Comments: pt reports "a few" falls recently Restrictions Other Position/Activity Restrictions: pt had a TLSO prescribed for comfort after his L1 burst fx, but does not use      Mobility  Bed Mobility Overal bed mobility: Needs Assistance Bed Mobility: Sidelying to Sit Rolling:  Min guard Sidelying to sit: Min guard;HOB elevated       General bed mobility comments: minG with use of rails to roll, minG with increased time to complete transition to sitting EOB, pt prefers to be given time to complete taks rather than recieve assist    Transfers Overall transfer level: Needs assistance Equipment used: None Transfers: Sit to/from Stand Sit to Stand: Min guard Stand pivot transfers: Min assist       General transfer comment: minG to power up to standing. pt able to take small pivoting steps to recliner with minA, reaching for BUE support on recliner to steady while turning to sit  Ambulation/Gait Ambulation/Gait assistance: Min assist Gait Distance (Feet): 3 Feet Assistive device: None Gait Pattern/deviations: Step-to pattern     General Gait Details: smal steps to pivot to recliner, pt reaching for BUE support on armrests of recliner. able to control pivot and lower      Balance Overall balance assessment: Needs assistance Sitting-balance support: Feet supported;No upper extremity supported Sitting balance-[name]: Fair     Standing balance support: No upper extremity supported;During functional activity Standing balance-[name]: Fair Standing balance comment: static stand to use urinal without UE support, minG for safety                             Pertinent Vitals/Pain Pain Assessment: Faces Faces Pain Scale: Hurts a little bit Pain Location: generalized Pain Descriptors / Indicators: Grimacing;Discomfort Pain Intervention(s): Limited activity within patient's tolerance;Monitored during session;Repositioned    Home Living Family/patient expects to be discharged to::  Private residence Living Arrangements: Alone Available Help at Discharge: Available PRN/intermittently;Neighbor Type of Home: Other(Comment) (motel) Home Access: Level entry     Home Layout: One level Home Equipment: Cane - single point;Wheelchair -  manual      Prior Function Level of Independence: Needs assistance   Gait / Transfers Assistance Needed: walks with a cane, but reports his w/c has been a "life saver"  ADL's / Homemaking Assistance Needed: Pt reports that he was Ind with ADLs/selfcare and that neighbors help with occasional housekeeping, food brought by delivery, or friends        Hand Dominance   Dominant Hand: Right    Extremity/Trunk Assessment   Upper Extremity Assessment Upper Extremity Assessment: Defer to OT evaluation    Lower Extremity Assessment Lower Extremity Assessment: Generalized weakness    Cervical / Trunk Assessment Cervical / Trunk Assessment: Other exceptions Cervical / Trunk Exceptions: L1 burst compression fx  Communication   Communication: HOH  Cognition Arousal/Alertness: Awake/alert Behavior During Therapy: Flat affect Overall Cognitive Status: Impaired/Different from baseline Area of Impairment: Safety/judgement;Problem solving;Memory                     Memory: Decreased short-term memory   Safety/Judgement: Decreased awareness of safety;Decreased awareness of deficits   Problem Solving: Slow processing General Comments: pt able to follow all cues, but prefers increased time to attempt all mobility prior to receiving assist. does not like to be rushed. decreased insight to benefit of rehab, insistant on returning home      General Comments General comments (skin integrity, edema, etc.): VSS, drain assessed by MD during session    Exercises     Assessment/Plan    PT Assessment Patient needs continued PT services  PT Problem List Decreased strength;Decreased activity tolerance;Decreased balance;Decreased mobility;Decreased coordination;Decreased cognition;Decreased safety awareness;Decreased knowledge of use of DME;Pain       PT Treatment Interventions DME instruction;Gait training;Functional mobility training;Therapeutic activities;Therapeutic  exercise;Balance training;Cognitive remediation;Patient/family education    PT Goals (Current goals can be found in the Care Plan section)  Acute Rehab PT Goals Patient Stated Goal: go back home PT Goal Formulation: With patient Time For Goal Achievement: 02/24/21 Potential to Achieve Goals: Fair    Frequency Min 3X/week   Barriers to discharge Decreased caregiver support         AM-PAC PT "6 Clicks" Mobility  Outcome Measure Help needed turning from your back to your side while in a flat bed without using bedrails?: None Help needed moving from lying on your back to sitting on the side of a flat bed without using bedrails?: A Little Help needed moving to and from a bed to a chair (including a wheelchair)?: A Little Help needed standing up from a chair using your arms (e.g., wheelchair or bedside chair)?: A Little Help needed to walk in hospital room?: A Lot Help needed climbing 3-5 steps with a railing? : Total 6 Click Score: 16    End of Session Equipment Utilized During Treatment: Gait belt Activity Tolerance: Patient tolerated treatment well Patient left: in chair;with call bell/phone within reach;with chair alarm set Nurse Communication: Mobility status PT Visit Diagnosis: Unsteadiness on feet (R26.81);Other abnormalities of gait and mobility (R26.89);Repeated falls (R29.6);Muscle weakness (generalized) (M62.81);History of falling (Z91.81);Difficulty in walking, not elsewhere classified (R26.2);Pain    Time: 6808-8110 PT Time Calculation (min) (ACUTE ONLY): 27 min   Charges:   PT Evaluation $PT Eval Low Complexity: 1 Low PT Treatments $Therapeutic Activity: 8-22 mins  Rolm Baptise, PT, DPT   Acute Rehabilitation Department Pager #: [phone]  Gaetana Michaelis 02/10/2021, 6:07 PM

## 2021-02-11 ENCOUNTER — Inpatient Hospital Stay (HOSPITAL_COMMUNITY): Payer: Medicare Other

## 2021-02-11 DIAGNOSIS — I9589 Other hypotension: Secondary | ICD-10-CM

## 2021-02-11 DIAGNOSIS — N179 Acute kidney failure, unspecified: Secondary | ICD-10-CM

## 2021-02-11 DIAGNOSIS — N189 Chronic kidney disease, unspecified: Secondary | ICD-10-CM

## 2021-02-11 DIAGNOSIS — E861 Hypovolemia: Secondary | ICD-10-CM

## 2021-02-11 LAB — COMPREHENSIVE METABOLIC PANEL
ALT: 10 U/L (ref 0–44)
AST: 13 U/L — ABNORMAL LOW (ref 15–41)
Albumin: 2.5 g/dL — ABNORMAL LOW (ref 3.5–5.0)
Alkaline Phosphatase: 79 U/L (ref 38–126)
Anion gap: 6 (ref 5–15)
BUN: 31 mg/dL — ABNORMAL HIGH (ref 8–23)
CO2: 17 mmol/L — ABNORMAL LOW (ref 22–32)
Calcium: 8.4 mg/dL — ABNORMAL LOW (ref 8.9–10.3)
Chloride: 111 mmol/L (ref 98–111)
Creatinine, Ser: 2.09 mg/dL — ABNORMAL HIGH (ref 0.61–1.24)
GFR, Estimated: 31 mL/min — ABNORMAL LOW (ref >60)
Glucose, Bld: 102 mg/dL — ABNORMAL HIGH (ref 70–99)
Potassium: 4.9 mmol/L (ref 3.5–5.1)
Sodium: 134 mmol/L — ABNORMAL LOW (ref 135–145)
Total Bilirubin: 0.6 mg/dL (ref 0.3–1.2)
Total Protein: 5.2 g/dL — ABNORMAL LOW (ref 6.5–8.1)

## 2021-02-11 LAB — CBC WITH DIFFERENTIAL/PLATELET
Abs Immature Granulocytes: 0.03 10*3/uL (ref 0.00–0.07)
Basophils Absolute: 0.1 10*3/uL (ref 0.0–0.1)
Basophils Relative: 2 %
Eosinophils Absolute: 0.1 10*3/uL (ref 0.0–0.5)
Eosinophils Relative: 2 %
HCT: 27 % — ABNORMAL LOW (ref 39.0–52.0)
Hemoglobin: 8.4 g/dL — ABNORMAL LOW (ref 13.0–17.0)
Immature Granulocytes: 1 %
Lymphocytes Relative: 14 %
Lymphs Abs: 0.6 10*3/uL — ABNORMAL LOW (ref 0.7–4.0)
MCH: 25.8 pg — ABNORMAL LOW (ref 26.0–34.0)
MCHC: 31.1 g/dL (ref 30.0–36.0)
MCV: 83.1 fL (ref 80.0–100.0)
Monocytes Absolute: 0.4 10*3/uL (ref 0.1–1.0)
Monocytes Relative: 9 %
Neutro Abs: 3.3 10*3/uL (ref 1.7–7.7)
Neutrophils Relative %: 72 %
Platelets: 225 10*3/uL (ref 150–400)
RBC: 3.25 MIL/uL — ABNORMAL LOW (ref 4.22–5.81)
RDW: 18.7 % — ABNORMAL HIGH (ref 11.5–15.5)
WBC: 4.6 10*3/uL (ref 4.0–10.5)
nRBC: 0 % (ref 0.0–0.2)

## 2021-02-11 LAB — URINE CULTURE: Culture: 10000 — AB

## 2021-02-11 MED ORDER — APIXABAN 2.5 MG PO TABS
2.5000 mg | ORAL_TABLET | Freq: Two times a day (BID) | ORAL | Status: DC
  Administered 2021-02-11 – 2021-02-12 (×3): 2.5 mg via ORAL
  Filled 2021-02-11 (×3): qty 1

## 2021-02-11 MED ORDER — MELATONIN 3 MG PO TABS
3.0000 mg | ORAL_TABLET | Freq: Every day | ORAL | Status: DC
  Administered 2021-02-11: 3 mg via ORAL
  Filled 2021-02-11: qty 1

## 2021-02-11 MED ORDER — LACTATED RINGERS IV SOLN: INTRAVENOUS | Status: DC

## 2021-02-11 MED ORDER — NICOTINE 7 MG/24HR TD PT24
7.0000 mg | MEDICATED_PATCH | Freq: Every day | TRANSDERMAL | Status: DC
  Administered 2021-02-11 – 2021-02-12 (×2): 7 mg via TRANSDERMAL
  Filled 2021-02-11 (×2): qty 1

## 2021-02-11 NOTE — Consult Note (Signed)
Urology Consult  Referring physician: Dr. Barbaraann Faster Reason for referral: perinephric fluid collection  Chief Complaint: fatigue  History of Present Illness: Alfred Mercado is a 85yo with a history of emphesematous pyelitis and perinephric abscess who was readmitted with dehydration and hypotension. He was previously admitted 5/31 with emphysematous pyelitis and a left perinephric fluid collection. He underwent right nephrostomy tube placement and left perinephric drain placement. The left perinephric drain was removed prior to discharge. After his discharge on 02/02/2021 he had poor PO intake and he presented to his [facility] followup appointment on 6/17 and was hypotensive. Per patient he was taking his bactrim. He underwent CT today which showed the right nephrostomy tube in good position and a 4.5cm left perinephric fluid collection. He denies any left flank pain. No fevers. WBC count normal.   Past Medical History:  Diagnosis Date   Acute heart failure with preserved ejection fraction (HCC)    Atrial fibrillation with rapid ventricular response (HCC) 07/11/2019   Past Surgical History:  Procedure Laterality Date   APPENDECTOMY     IR GUIDED DRAIN W CATHETER PLACEMENT  01/25/2021   IR NEPHROSTOMY PLACEMENT RIGHT  01/25/2021    Medications: I have reviewed the patient's current medications. Allergies: No Known Allergies  Family History  Problem Relation Age of Onset   Aneurysm Father    Social History:  reports that he has been smoking. He has never used smokeless tobacco. He reports previous alcohol use. He reports that he does not use drugs.  Review of Systems  Constitutional:  Positive for fatigue.  All other systems reviewed and are negative.  Physical Exam:  Vital signs in last 24 hours: Temp:  [97.8 F (36.6 C)-98 F (36.7 C)] 97.8 F (36.6 C) (06/18 2108) Pulse Rate:  [91-100] 91 (06/18 2108) Resp:  [15-16] 15 (06/18 2108) BP: (109-114)/(58-65) 109/58 (06/18 2108) SpO2:  [99 %] 99 %  (06/18 2108) Physical Exam Vitals reviewed.  Constitutional:      Appearance: Normal appearance.  HENT:     Head: Normocephalic and atraumatic.     Nose: Nose normal. No congestion.     Mouth/Throat:     Mouth: Mucous membranes are moist.  Eyes:     Extraocular Movements: Extraocular movements intact.     Pupils: Pupils are equal, round, and reactive to light.  Cardiovascular:     Rate and Rhythm: Normal rate and regular rhythm.  Pulmonary:     Effort: Pulmonary effort is normal. No respiratory distress.  Abdominal:     General: Abdomen is flat. There is no distension.  Musculoskeletal:        General: No swelling. Normal range of motion.     Cervical back: Normal range of motion and neck supple.  Skin:    General: Skin is warm and dry.  Neurological:     General: No focal deficit present.     Mental Status: He is alert and oriented to person, place, and time.  Psychiatric:        Mood and Affect: Mood normal.        Behavior: Behavior normal.        Thought Content: Thought content normal.        Judgment: Judgment normal.    Laboratory Data:  Results for orders placed or performed during the hospital encounter of 02/09/21 (from the past 72 hour(s))  Lactic acid, plasma     Status: None   Collection Time: 02/09/21  6:15 PM  Result Value Ref Range  Lactic Acid, Venous 0.9 0.5 - 1.9 mmol/L    Comment: Performed at [facility], [address] [place], [place] [address]  Lactic acid, plasma     Status: None   Collection Time: 02/09/21  8:31 PM  Result Value Ref Range   Lactic Acid, Venous 1.6 0.5 - 1.9 mmol/L    Comment: Performed at [facility], [address] [place], [place] [address]  CBC with Differential/Platelet     Status: Abnormal   Collection Time: 02/10/21  3:25 AM  Result Value Ref Range   WBC 4.6 4.0 - 10.5 K/uL   RBC 3.03 (L) 4.22 - 5.81 MIL/uL   Hemoglobin 7.8 (L) 13.0 - 17.0 g/dL   HCT 09.824.6 (L) 11.939.0 - 14.752.0 %   MCV 81.2 80.0 - 100.0 fL    MCH 25.7 (L) 26.0 - 34.0 pg   MCHC 31.7 30.0 - 36.0 g/dL   RDW 82.918.6 (H) 56.211.5 - 13.015.5 %   Platelets 235 150 - 400 K/uL   nRBC 0.0 0.0 - 0.2 %   Neutrophils Relative % 68 %   Neutro Abs 3.2 1.7 - 7.7 K/uL   Lymphocytes Relative 19 %   Lymphs Abs 0.9 0.7 - 4.0 K/uL   Monocytes Relative 8 %   Monocytes Absolute 0.4 0.1 - 1.0 K/uL   Eosinophils Relative 2 %   Eosinophils Absolute 0.1 0.0 - 0.5 K/uL   Basophils Relative 2 %   Basophils Absolute 0.1 0.0 - 0.1 K/uL   Immature Granulocytes 1 %   Abs Immature Granulocytes 0.03 0.00 - 0.07 K/uL    Comment: Performed at [facility], [address] [place], [place] [address]  Comprehensive metabolic panel     Status: Abnormal   Collection Time: 02/10/21  3:25 AM  Result Value Ref Range   Sodium 136 135 - 145 mmol/L   Potassium 4.9 3.5 - 5.1 mmol/L   Chloride 110 98 - 111 mmol/L   CO2 17 (L) 22 - 32 mmol/L   Glucose, Bld 95 70 - 99 mg/dL    Comment: Glucose reference range applies only to samples taken after fasting for at least 8 hours.   BUN 44 (H) 8 - 23 mg/dL   Creatinine, Ser 4.693.10 (H) 0.61 - 1.24 mg/dL   Calcium 8.3 (L) 8.9 - 10.3 mg/dL   Total Protein 5.2 (L) 6.5 - 8.1 g/dL   Albumin 2.5 (L) 3.5 - 5.0 g/dL   AST 12 (L) 15 - 41 U/L   ALT 9 0 - 44 U/L   Alkaline Phosphatase 77 38 - 126 U/L   Total Bilirubin 0.6 0.3 - 1.2 mg/dL   GFR, Estimated 19 (L) >60 mL/min    Comment: (NOTE) Calculated using the CKD-EPI Creatinine Equation (2021)    Anion gap 9 5 - 15    Comment: Performed at [facility], [address] [place], [place] [address]  CBC with Differential/Platelet     Status: Abnormal   Collection Time: 02/11/21 10:33 AM  Result Value Ref Range   WBC 4.6 4.0 - 10.5 K/uL   RBC 3.25 (L) 4.22 - 5.81 MIL/uL   Hemoglobin 8.4 (L) 13.0 - 17.0 g/dL   HCT 84.127.0 (L) 32.439.0 - 40.152.0 %   MCV 83.1 80.0 - 100.0 fL   MCH 25.8 (L) 26.0 - 34.0 pg   MCHC 31.1 30.0 - 36.0 g/dL   RDW 02.718.7 (H) 25.311.5 - 66.415.5 %   Platelets 225  150 -  400 K/uL   nRBC 0.0 0.0 - 0.2 %   Neutrophils Relative % 72 %   Neutro Abs 3.3 1.7 - 7.7 K/uL   Lymphocytes Relative 14 %   Lymphs Abs 0.6 (L) 0.7 - 4.0 K/uL   Monocytes Relative 9 %   Monocytes Absolute 0.4 0.1 - 1.0 K/uL   Eosinophils Relative 2 %   Eosinophils Absolute 0.1 0.0 - 0.5 K/uL   Basophils Relative 2 %   Basophils Absolute 0.1 0.0 - 0.1 K/uL   Immature Granulocytes 1 %   Abs Immature Granulocytes 0.03 0.00 - 0.07 K/uL    Comment: Performed at [facility], [address] [place], [place] [address]  Comprehensive metabolic panel     Status: Abnormal   Collection Time: 02/11/21 10:33 AM  Result Value Ref Range   Sodium 134 (L) 135 - 145 mmol/L   Potassium 4.9 3.5 - 5.1 mmol/L   Chloride 111 98 - 111 mmol/L   CO2 17 (L) 22 - 32 mmol/L   Glucose, Bld 102 (H) 70 - 99 mg/dL    Comment: Glucose reference range applies only to samples taken after fasting for at least 8 hours.   BUN 31 (H) 8 - 23 mg/dL   Creatinine, Ser 6.04 (H) 0.61 - 1.24 mg/dL    Comment: DELTA CHECK NOTED   Calcium 8.4 (L) 8.9 - 10.3 mg/dL   Total Protein 5.2 (L) 6.5 - 8.1 g/dL   Albumin 2.5 (L) 3.5 - 5.0 g/dL   AST 13 (L) 15 - 41 U/L   ALT 10 0 - 44 U/L   Alkaline Phosphatase 79 38 - 126 U/L   Total Bilirubin 0.6 0.3 - 1.2 mg/dL   GFR, Estimated 31 (L) >60 mL/min    Comment: (NOTE) Calculated using the CKD-EPI Creatinine Equation (2021)    Anion gap 6 5 - 15    Comment: Performed at [facility], [address] [place], [place] [address]   Recent Results (from the past 240 hour(s))  SARS Coronavirus 2 (TAT 6-24 hrs)     Status: None   Collection Time: 02/09/21 10:08 AM   Specimen: Nasopharyngeal Swab  Result Value Ref Range Status   SARS Coronavirus 2 NEGATIVE NEGATIVE Final    Comment: (NOTE) SARS-CoV-2 target nucleic acids are NOT DETECTED.  The SARS-CoV-2 RNA is generally detectable in upper and lower respiratory specimens during the acute phase of infection.  Negative results do not preclude SARS-CoV-2 infection, do not rule out co-infections with other pathogens, and should not be used as the sole basis for treatment or other patient management decisions. Negative results must be combined with clinical observations, patient history, and epidemiological information. The expected result is Negative.  Fact Sheet for Patients: HairSlick.no  Fact Sheet for Healthcare Providers: quierodirigir.com  This test is not yet approved or cleared by the [place] FDA and  has been authorized for detection and/or diagnosis of SARS-CoV-2 by FDA under an Emergency Use Authorization (EUA). This EUA will remain  in effect (meaning this test can be used) for the duration of the COVID-19 declaration under Se ction 564(b)(1) of the Act, 21 U.S.C. section 360bbb-3(b)(1), unless the authorization is terminated or revoked sooner.  Performed at [facility], [address] [place], [place] [address]   Culture, Urine     Status: Abnormal   Collection Time: 02/09/21  1:10 PM   Specimen: Urine  Result Value Ref Range Status  Specimen Description Urine  Final   Special Requests   Final    NONE Performed at [facility], [address] [place], [place] [address]    Culture 10,000 COLONIES/mL STAPHYLOCOCCUS EPIDERMIDIS (A)  Final   Report Status 02/11/2021 FINAL  Final   Organism ID, Bacteria STAPHYLOCOCCUS EPIDERMIDIS (A)  Final      Susceptibility   Staphylococcus epidermidis - MIC*    CIPROFLOXACIN 1 SENSITIVE Sensitive     ERYTHROMYCIN >=8 RESISTANT Resistant     GENTAMICIN <=0.5 SENSITIVE Sensitive     OXACILLIN >=4 RESISTANT Resistant     TETRACYCLINE <=1 SENSITIVE Sensitive     VANCOMYCIN 1 SENSITIVE Sensitive     TRIMETH/SULFA 80 RESISTANT Resistant     CLINDAMYCIN >=8 RESISTANT Resistant     RIFAMPIN <=0.5 SENSITIVE Sensitive     Inducible Clindamycin NEGATIVE Sensitive      * 10,000 COLONIES/mL STAPHYLOCOCCUS EPIDERMIDIS   Creatinine: Recent Labs    02/09/21 1008 02/10/21 0325 02/11/21 1033  CREATININE 3.61* 3.10* 2.09*   Baseline Creatinine: 1.5  Impression/Assessment:  85yo with right staghorn calculus and left perinephric fluid ction  Plan:  I discussed the management of the left perinephric fluid collection with the patient and since the patient is currently asymptomatic we have elected to proceed with observation. If the patient becomes febrile or WBC increases he will need repeat CT imaging. The right nephrostomy tube is in good position and the right staghorn calculus does not require intervention at this time. He should followup with Dr. Arita Miss at [facility] after discharge to schedule removal of his right staghorn calculus.   Wilkie Aye 02/11/2021, 10:27 PM

## 2021-02-11 NOTE — Progress Notes (Addendum)
Physical Therapy Treatment Patient Details Name: Alfred Mercado MRN: [id number] DOB: 07-Apr-1936 Today's Date: 02/11/2021    History of Present Illness Pt is an 85 year old man admitted on 02/09/21 with progressive weakness, lightheadedness. +hyperkalemia, anemia. PMH: L1 burst fx 01/24/21, admisssion earlier this month with UTI and L pyelonephrotic abscess with drain placement, COPD, CHF, afib, tobacco use, CKD 3a.    PT Comments    Patient presents with decreased balance, impulsivity and decreased understanding of deficits/safety awareness.  If patient discharges home he is high fall risk.  Continue to recommend SNF as safest d/c plan at this time.  Do feel patient can continue to increase independence and safety with mobility with further therapy.  If d/c home - recommend max HH services.   Limited session today as pt fatigued from using restroom.    Follow Up Recommendations  SNF;Supervision for mobility/OOB     Equipment Recommendations  None recommended by PT    Recommendations for Other Services       Precautions / Restrictions Precautions Precautions: Fall Precaution Comments: pt reports "a few" falls recently Spinal Brace Comments: has TLSO in room that was to be used for comfort with OOB, however now he has 2 drains coming out of his back making it painful and difficulty to place brace    Mobility  Bed Mobility               General bed mobility comments: pt recieved on toilet    Transfers Overall transfer level: Needs assistance Equipment used: None Transfers: Sit to/from Stand Sit to Stand: Min guard         General transfer comment: pt used grab bar to assist to stand  Ambulation/Gait Ambulation/Gait assistance: Min assist Gait Distance (Feet): 8 Feet Assistive device: None Gait Pattern/deviations: Step-through pattern Gait velocity: decreased   General Gait Details: pt ambulated from toilet to bed; held onto walls/doors while walking; refused to  use cane   Stairs             Wheelchair Mobility    Modified Rankin (Stroke Patients Only)       Balance Overall balance assessment: Needs assistance         Standing balance support: Bilateral upper extremity supported;During functional activity Standing balance-[name]: Poor Standing balance comment: reliant on walls/doors for balance                            Cognition Arousal/Alertness: Awake/alert Behavior During Therapy: Impulsive                             Safety/Judgement: Decreased awareness of safety;Decreased awareness of deficits            Exercises      General Comments        Pertinent Vitals/Pain Faces Pain Scale: Hurts a little bit Pain Location: generalized Pain Descriptors / Indicators: Discomfort Pain Intervention(s): Limited activity within patient's tolerance;Monitored during session    Home Living                      Prior Function            PT Goals (current goals can now be found in the care plan section) Progress towards PT goals: Progressing toward goals    Frequency    Min 3X/week      PT Plan Current  plan remains appropriate    Co-evaluation              AM-PAC PT "6 Clicks" Mobility   Outcome Measure  Help needed turning from your back to your side while in a flat bed without using bedrails?: None Help needed moving from lying on your back to sitting on the side of a flat bed without using bedrails?: A Little Help needed moving to and from a bed to a chair (including a wheelchair)?: A Little Help needed standing up from a chair using your arms (e.g., wheelchair or bedside chair)?: A Little Help needed to walk in hospital room?: A Little Help needed climbing 3-5 steps with a railing? : A Lot 6 Click Score: 18    End of Session   Activity Tolerance: Patient limited by fatigue Patient left: in bed;with call bell/phone within reach;with bed alarm set Nurse  Communication: Mobility status PT Visit Diagnosis: Unsteadiness on feet (R26.81);Other abnormalities of gait and mobility (R26.89);Repeated falls (R29.6);Muscle weakness (generalized) (M62.81);History of falling (Z91.81);Difficulty in walking, not elsewhere classified (R26.2);Pain     Time: 1500-1510 PT Time Calculation (min) (ACUTE ONLY): 10 min  Charges:  $Gait Training: 8-22 mins                     02/11/2021 Margie, PT Acute Rehabilitation Services Pager:  [phone] Office:  [phone]    Olivia Canter 02/11/2021, 3:18 PM

## 2021-02-11 NOTE — Progress Notes (Addendum)
    Subjective:  No acute events overnight.  He reports feel well this morning and no acute concerns. Patient is still adamant about leaving soon. We discussed in detail our concerns and need to continue monitoring, possibly for multiple day. The patient continues to discuss leaving without showing appreciation for the risk of worsening kidney function, leading to ESRD, and possible death if he doesn't allow treatment. Attending kindly reminds patient to refer to physicians by name and not nick names. Patient offers apology and shares he has upmost respect for physicians.   Objective:  Vital signs in last 24 hours: Vitals:   02/10/21 0828 02/10/21 1520 02/10/21 2108 02/11/21 1247  BP: (!) 109/59 108/68 120/76 114/65  Pulse: 91 96 96 100  Resp: 16 16 14 16   Temp: (!) 97.5 F (36.4 C) 98.2 F (36.8 C) 99.3 F (37.4 C) 98 F (36.7 C)  TempSrc: Oral Oral Oral Oral  SpO2: 97% 100% 100% 99%   Supplemental O2: Room Air SpO2: 99 %   Physical Exam:   General: Elderly man resting in bed, sits up on his own Cardiovascular: Normal rate, regular rhythm, no murmur Pulmonary : Normal work of breathing, decreased breath sounds throughout ,No wheezes, rales, or rhonchi Back: Right PCN intact with clean dressing, dark urine Abdominal: soft, nontender,  bowel sounds present Musculoskeletal: no swelling , deformity  Assessment/Plan:   Active Problems:   Hypotension   Patient Summary: Hospital day 2 for Alfred Mercado a 85 y.o. person living with HFpEF (11/20 EF 50-55%), atrial fibrillation on Eliquis, heavy tobacco use, COPD, and vitamin D deficiency who returns to the hospital with dizziness and hypotension after recent discharge on June 9 for pyelonephritic abscess.  Patient was recommended to go to SNF at discharge but decided to discharge home against recommendation.   #Right staghorn renal calculus #AKI on CKD 3A -Recent history of left upper pole nonobstructive renal calculus and  perinephric fluid collection which was drained with nephrostomy tube.  Patient was discharged with right PCN with plan for follow-up with urology.  Baseline creatinine approximately 1.2-1.4, before last hospital stay.  Concern for AIN given patient was on Bactrim but no fever rash or serum eosinophilia, appreciate this triad is seen very rarely.  Creatinine improving with IV fluids, likely supports prerenal AKI.  We will further evaluate kidneys with CT abdomen pelvis today to ensure patient does not have worsening hydronephrosis from possible obstruction.  Appreciate IR right PCN today, continues to have good output.  -Continue to monitor kidney function -Follow-up CT abdomen and pelvis -Continue IV fluids  #Iron deficiency anemia Patient received 2 IV Feraheme transfusions last admission.  Hemoglobin stable at 8.4 today Will recommend outpatient follow-up at discharge to continue monitoring for improvement.   #Atrial fibrillation, chronic stable Normal rate.  Eliquis 5 mg twice daily was held yesterday given hematuria.  We will start back anticoagulation at 2.5 mg twice daily.   #L1 burst fracture Found on prior admission after several falls, neurosurgery saw and recommended no acute interventions.  They recommend follow-up x-rays in 1 month. -PT/OT, patient still refusing SNF. -Follow-up x-rays in 1 month  Diet: Renal IVF: LR,125cc/hr VTE: NOAC Code: DNR PT/OT recs: SNF-patient not in agreement   Dispo: Anticipated discharge is pending continued improvement in renal function and capacity evaluation.  07-30-2000, MD PGY2 Internal Medicine Pager: [phone] Please contact the on call pager after 5 pm and on weekends at [phone].

## 2021-02-11 NOTE — Progress Notes (Signed)
    Chief Complaint: Patient was seen today for (R)PCN  Supervising Physician: Oley Balm  Patient Status: [facility] - In-pt  Subjective: Pt with large staghorn right renal calculus. Had (R)PCN placed on 6/1, also had a left perinephric abscess drain placed on same date. (L)drain has subsequently been removed. Pt was discharged but has been readmitted. IR is asked to assess (R)PCN for proper function. Pt denies any specific complaints.  Objective: Physical Exam: BP 120/76 (BP Location: Left Arm)   Pulse 96   Temp 99.3 F (37.4 C) (Oral)   Resp 14   SpO2 100%  (R)PCN intact site clean. Uresil bag full of clear but dark urine. Unable to open valve to empty.   Current Facility-Administered Medications:    acetaminophen (TYLENOL) tablet 650 mg, 650 mg, Oral, Q6H PRN, 650 mg at 02/10/21 2130 **OR** acetaminophen (TYLENOL) suppository 650 mg, 650 mg, Rectal, Q6H PRN, Theotis Barrio, MD   albuterol (VENTOLIN HFA) 108 (90 Base) MCG/ACT inhaler 2 puff, 2 puff, Inhalation, Q6H PRN, Theotis Barrio, MD   nicotine (NICODERM CQ - dosed in mg/24 hr) patch 7 mg, 7 mg, Transdermal, Daily, [name], MD, 7 mg at 02/11/21 0949   ondansetron (ZOFRAN) tablet 4 mg, 4 mg, Oral, Q6H PRN **OR** ondansetron (ZOFRAN) injection 4 mg, 4 mg, Intravenous, Q6H PRN, Theotis Barrio, MD   polyethylene glycol (MIRALAX / GLYCOLAX) packet 17 g, 17 g, Oral, Daily, Theotis Barrio, MD, 17 g at 02/11/21 0949   umeclidinium bromide (INCRUSE ELLIPTA) 62.5 MCG/INH 1 puff, 1 puff, Inhalation, Daily, Miguel Aschoff, MD  Labs: CBC Recent Labs    02/09/21 1008 02/10/21 0325  WBC 7.1 4.6  HGB 8.8* 7.8*  HCT 28.7* 24.6*  PLT 278 235   BMET Recent Labs    02/09/21 1008 02/10/21 0325  NA 134* 136  K 5.3* 4.9  CL 107 110  CO2 17* 17*  GLUCOSE 92 95  BUN 49* 44*  CREATININE 3.61* 3.10*  CALCIUM 8.5* 8.3*   LFT Recent Labs    02/10/21 0325  PROT 5.2*  ALBUMIN 2.5*  AST 12*  ALT 9  ALKPHOS 77   BILITOT 0.6   PT/INR No results for input(s): LABPROT, INR in the last 72 hours.   Studies/Results: No results found.  Assessment/Plan: (R)PCN for staghorn calculus with hydro 6/1 Replaced gravity uresil bag. Flushed PCN easily with prompt return of thin pale urine. Drain connected and set to gravity. RN updated to bag exchange     LOS: 2 days   I spent a total of 15 minutes in face to face in clinical consultation, greater than 50% of which was counseling/coordinating care for (R)PCN  Brayton El PA-C 02/11/2021 11:56 AM

## 2021-02-12 DIAGNOSIS — N19 Unspecified kidney failure: Secondary | ICD-10-CM

## 2021-02-12 LAB — CBC WITH DIFFERENTIAL/PLATELET
Abs Immature Granulocytes: 0.03 10*3/uL (ref 0.00–0.07)
Basophils Absolute: 0.1 10*3/uL (ref 0.0–0.1)
Basophils Relative: 1 %
Eosinophils Absolute: 0.2 10*3/uL (ref 0.0–0.5)
Eosinophils Relative: 4 %
HCT: 25.8 % — ABNORMAL LOW (ref 39.0–52.0)
Hemoglobin: 8.2 g/dL — ABNORMAL LOW (ref 13.0–17.0)
Immature Granulocytes: 1 %
Lymphocytes Relative: 21 %
Lymphs Abs: 0.9 10*3/uL (ref 0.7–4.0)
MCH: 26.2 pg (ref 26.0–34.0)
MCHC: 31.8 g/dL (ref 30.0–36.0)
MCV: 82.4 fL (ref 80.0–100.0)
Monocytes Absolute: 0.4 10*3/uL (ref 0.1–1.0)
Monocytes Relative: 9 %
Neutro Abs: 2.8 10*3/uL (ref 1.7–7.7)
Neutrophils Relative %: 64 %
Platelets: 238 10*3/uL (ref 150–400)
RBC: 3.13 MIL/uL — ABNORMAL LOW (ref 4.22–5.81)
RDW: 18.9 % — ABNORMAL HIGH (ref 11.5–15.5)
WBC: 4.4 10*3/uL (ref 4.0–10.5)
nRBC: 0 % (ref 0.0–0.2)

## 2021-02-12 LAB — BASIC METABOLIC PANEL
Anion gap: 5 (ref 5–15)
BUN: 29 mg/dL — ABNORMAL HIGH (ref 8–23)
CO2: 20 mmol/L — ABNORMAL LOW (ref 22–32)
Calcium: 8.4 mg/dL — ABNORMAL LOW (ref 8.9–10.3)
Chloride: 110 mmol/L (ref 98–111)
Creatinine, Ser: 1.94 mg/dL — ABNORMAL HIGH (ref 0.61–1.24)
GFR, Estimated: 34 mL/min — ABNORMAL LOW (ref >60)
Glucose, Bld: 88 mg/dL (ref 70–99)
Potassium: 5 mmol/L (ref 3.5–5.1)
Sodium: 135 mmol/L (ref 135–145)

## 2021-02-12 MED ORDER — FUROSEMIDE 40 MG PO TABS: 40.0000 mg | ORAL_TABLET | Freq: Every day | ORAL | 1 refills | Status: DC

## 2021-02-12 MED ORDER — APIXABAN 2.5 MG PO TABS: 2.5000 mg | ORAL_TABLET | Freq: Two times a day (BID) | ORAL | 2 refills | Status: DC

## 2021-02-12 NOTE — Plan of Care (Addendum)
Patient educated on D/C instructions, patient understand meds and appointments. Patient belongings packed up and sent home, pt obtained $140.00 atm machine and placed in wallet prior to leaving with family friend.    Problem: Education: Goal: Knowledge of General Education information will improve Description: Including pain rating scale, medication(s)/side effects and non-pharmacologic comfort measures Outcome: Progressing   Problem: Activity: Goal: Risk for activity intolerance will decrease Outcome: Progressing   Problem: Pain Managment: Goal: General experience of comfort will improve Outcome: Progressing   Problem: Safety: Goal: Ability to remain free from injury will improve Outcome: Progressing   Problem: Skin Integrity: Goal: Risk for impaired skin integrity will decrease Outcome: Progressing

## 2021-02-12 NOTE — Plan of Care (Signed)
  Problem: Education: Goal: Knowledge of General Education information will improve Description: Including pain rating scale, medication(s)/side effects and non-pharmacologic comfort measures 02/12/2021 1638 by Darleene Cleaver, RN Outcome: Adequate for Discharge 02/12/2021 1322 by Darleene Cleaver, RN Outcome: Progressing   Problem: Activity: Goal: Risk for activity intolerance will decrease 02/12/2021 1638 by Darleene Cleaver, RN Outcome: Adequate for Discharge 02/12/2021 1322 by Darleene Cleaver, RN Outcome: Progressing   Problem: Pain Managment: Goal: General experience of comfort will improve 02/12/2021 1638 by Darleene Cleaver, RN Outcome: Adequate for Discharge 02/12/2021 1322 by Darleene Cleaver, RN Outcome: Progressing   Problem: Safety: Goal: Ability to remain free from injury will improve 02/12/2021 1638 by Darleene Cleaver, RN Outcome: Adequate for Discharge 02/12/2021 1322 by Darleene Cleaver, RN Outcome: Progressing   Problem: Skin Integrity: Goal: Risk for impaired skin integrity will decrease 02/12/2021 1638 by Darleene Cleaver, RN Outcome: Adequate for Discharge 02/12/2021 1322 by Darleene Cleaver, RN Outcome: Progressing

## 2021-02-12 NOTE — Discharge Instructions (Addendum)
[name],  It was a pleasure taking care of you at [facility]. You were admitted for low blood pressure and treated for hypotension and acute kidney injury. We found some more fluid in your lungs that we wanted to drain, but since you refuse to stay for it, we are sending you home on a fluid pill to help get the fluid off your lungs. While we advised you to stay, we are discharging you home since you want to go home today. Please follow the following instructions.  1) Take Lasix 40 mg daily 2) Follow-up with urology tomorrow 3) Follow-up with our clinic in the middle of the week, someone will call you to schedule an appointment. 4) Make sure you drink plenty of water to stay hydrated.   Take care,  Dr. Sharrell Ku, MD, MPH

## 2021-02-12 NOTE — Discharge Summary (Signed)
Name: Alfred Mercado MRN: [id number] DOB: 07/19/1936 85 y.o. PCP: Hoy Register, MD  Date of Admission: 02/09/2021  5:45 PM Date of Discharge: 02/12/2021 Attending Physician: [name], MD  Discharge Diagnosis: 1.  Hypotension 2.  Right staghorn calculi/hematuria 3.  Left perinephric fluid collection 4.  AKI on CKD 3A 5. Pleural effusion  Discharge Medications: Allergies as of 02/12/2021   No Known Allergies      Medication List     STOP taking these medications    sulfamethoxazole-trimethoprim 800-160 MG tablet Commonly known as: BACTRIM DS       TAKE these medications    acetaminophen 325 MG tablet Commonly known as: TYLENOL Take 650 mg by mouth every 6 (six) hours as needed for mild pain or headache.   albuterol 108 (90 Base) MCG/ACT inhaler Commonly known as: VENTOLIN HFA Inhale 2 puffs into the lungs every 6 (six) hours as needed for wheezing or shortness of breath.   apixaban 2.5 MG Tabs tablet Commonly known as: ELIQUIS Take 1 tablet (2.5 mg total) by mouth 2 (two) times daily. What changed:  medication strength how much to take   Dulcolax 5 MG EC tablet Generic drug: bisacodyl Take 20 mg by mouth daily as needed for moderate constipation.   furosemide 40 MG tablet Commonly known as: Lasix Take 1 tablet (40 mg total) by mouth daily.   hydrOXYzine 10 MG tablet Commonly known as: ATARAX/VISTARIL Take 1 tablet (10 mg total) by mouth 3 (three) times daily as needed for itching.   metoprolol tartrate 25 MG tablet Commonly known as: LOPRESSOR TAKE 1 TABLET(25 MG) BY MOUTH TWICE DAILY What changed:  how much to take how to take this when to take this additional instructions   Misc. Devices Misc Power wheelchair.  Diagnosis atrial fibrillation, gait abnormality   nicotine 7 mg/24hr patch Commonly known as: NICODERM CQ - dosed in mg/24 hr Place 1 patch (7 mg total) onto the skin daily at 6 (six) AM.   Spiriva Respimat 2.5 MCG/ACT  Aers Generic drug: Tiotropium Bromide Monohydrate INHALE 2 PUFFS BY MOUTH DAILY What changed:  how much to take how to take this when to take this additional instructions   tamsulosin 0.4 MG Caps capsule Commonly known as: FLOMAX Take 1 capsule (0.4 mg total) by mouth daily.   Vitamin D (Ergocalciferol) 1.25 MG (50000 UNIT) Caps capsule Commonly known as: DRISDOL TAKE 1 CAPSULE BY MOUTH 1 TIME A WEEK What changed:  how much to take how to take this when to take this additional instructions        Disposition and follow-up:   AlfredTelvin Percell Mercado was discharged from [facility] in Stable condition.  At the hospital follow up visit please address:  1.  Hypotension: Patient's SBP was in the 110s at discharge. Check BP and make changes to medications at your discretion. Consider holding one of his antihypertensive meds if BP remains low. 2.  Right staghorn calculus/hematuria/left perinephric fluid collection: Right nephrostomy tube still in place. Please ensure patient follows up with urology in the outpatient.  3.  AKI on CKD 3A: Ensure patient is drinking enough water. Monitor kidney function with repeat BMP. Reassess need for Lasix. 4.  Pleural effusion: Patient was not in respiratory distress at discharge.  Auscultate lungs and reassess need for Lasix. Consider repeating CXR in the near future to assess for resolution of pleural effusions.  5.  Labs / imaging needed at time of follow-up: BMP, CBC  6.  Pending labs/ test needing follow-up: None  Follow-up Appointments:  Follow-up Information     Internal Medicine Clinic. Go in 3 day(s).   Contact information: Ground Floor - [facility], [address] [place], [place], [place] [address]                Hospital Course by problem list: Hypotension Patient was admitted directly from clinic after he was found to have a blood pressure of 78/46. Patient was lightheaded, had difficulty walking into clinic  and had positive orthostatic vitals.  Per patient's neighbor who accompanied him, patient had decreased p.o. intake since discharge from previous hospitalization.  He appears very dry on exam. He received IV fluid bolus in the clinic with improvement in BP to systolic in the 100s. He was admitted for further evaluation and management of his hypotension. Lactate was within normal limits. He received IV fluids during his 2-day stay in the hospital.  BP at discharge was 117/68 with significant improvement in symptoms. Patient advised to stay hydrated at all times due to risk of further dehydration and possible syncope in the setting of his chronic medical problems.  Right staghorn renal calculus Macroscopic and microscopic hematuria Patient had right percutaneous nephrostomy tube placed on 6/1 by VIR for right staghorn calculus during last hospitalization. Patient was instructed to keep tube in place until outpatient follow-up with urology.  During admission, patient had a grossly bloody urine and a UA showed >50 RBCs and large hemoglobin. Urology was consulted recommended gross hematuria in the setting is not unexpected and they plan to follow-up with patient in the outpatient. RN had difficulty flushing patient is to and IR was consulted to evaluate. IR replaced uresil bag and flush patient's tube with return of thin pale urine. Urology plans to follow-up with patient in the next few days after discharge.  Left perinephric fluid collection Patient was recently hospitalized for complicated UTI with left pyelonephritis abscess. JP bulb drain remain in place for 1 week until it was wrist moved by IR right before discharge.  Patient was discharged home on a 2-week course of Bactrim. On this admission, urinalysis showed persistent but improved urinary tract infection. CT abdomen/pelvis showed 4.5 x 2.4 cm collection adjacent to the left lateral renal cortex likely residual hematoma or abscess.  Patient remains  afebrile with no flank pain and normal white count.  Urology was consulted and due to patient being currently asymptomatic, they elected to proceed with observation at this time. Plan to have patient follow-up with Dr. Arita Miss in the outpatient for removal of his right staghorn calculus.   AKI on CKD 3 Hyperkalemia On previous discharge, patient had a creatinine of 1.8 after resolution of AKI on CKD. On admission, patient found to have creatinine of 3.10, BUN of 44 and GFR of 19.  Patient also found to have a potassium of 5.3. Baseline creatinine around 1.5.  Patient's AKI on CKD was thought to be multifactorial in nature in the setting of dehydration and recent use of Bactrim.  Bactrim was discontinued on admission patient received IV fluid resuscitation during hospitalization.  At discharge, patient's creatinine was down to 1.94, BUN 29, GFR 34 and potassium of 5.0.  Patient instructed to stay hydrated to avoid further AKI.  Pleural effusion Patient has a history of small right and trace pleural effusions that was noted on his CT during last hospitalization. His CT abdomen/pelvis during this admission showed increased size of the right pleural effusion to moderate to  large Rendell Thivierge development small left pleural effusion. Patient was advised to stay in the hospital to have fluid drained from lungs the patient remained adamant about leaving the hospital on day of discharge because it is Father's Day and he has people to see.  After multiple refusals to stay in the hospital, patient was discharged home on Lasix 40 mg daily to help improve his pleural effusions. Patient was specifically instructed to drink plenty of water in order to avoid further dehydration while on Lasix to help with the pleural effusions. [facility] to schedule a hospital follow-up appointment for patient in a few days to reassess vitals and symptoms.  Iron deficiency Anemia Baseline hgb 10-11. Received iron infusion on  prior admission.  Hemoglobin ranged from 7.8-8.8 during hospitalization.  At discharge, patient's hemoglobin was 8.2.    Persistent A.Fib On metoprolol, eliquis at home. CHADVASC2 score of 4. Heart rate remained in the 80s to 100s throughout hospitalizations. Patient had irregularly irregular rhythm on exam. Patient's Eliquis 5 mg twice daily was decreased to 2.5 mg twice daily due to age and renal function. He was discharged home on Eliquis 2.5 mg twice daily.   Subjective: Patient was evaluated at the bedside laying comfortably in bed. Patient informed team right away that he wanted to leave today. Advised patient that our recommendation is for him to stay in the hospital for 1-2 more days to get fluid off his lungs and improve his blood pressure. Patient was adamant that he feels fine and would like to be discharged.  Patient requested option to call his doctors at home whenever he was not feeling well also have his questions answered.  We informed patient that since he is now willing to stay, we will have him follow-up with our clinic within a few days.  Discharge Exam:   BP 117/68 (BP Location: Left Arm)   Pulse 100   Temp 97.9 F (36.6 C) (Oral)   Resp 20   SpO2 100%  General: Slightly cachectic elderly man laying in bed. No acute distress. Head: Normocephalic. Atraumatic. CV: Regular rate.  Irregularly irregular rhythm.  No murmurs, rubs, or gallops. No LE edema Pulmonary: Lungs CTAB. Normal effort. No wheezing or rales. Abdominal: Soft, nontender, nondistended. Normal bowel sounds. Back: Right PCN tube in place with clean dressing, dark urine Extremities: Palpable pulses. Normal ROM. Skin: Warm and dry. No obvious rash or lesions. Neuro: A&Ox3. Moves all extremities. Normal sensation. No focal deficit. Psych: Normal mood and affect   Pertinent Labs, Studies, and Procedures:  BMP Latest Ref Rng & Units 02/12/2021 02/11/2021 02/10/2021  Glucose 70 - 99 mg/dL 88 161(W102(H) 95  BUN 8 - 23  mg/dL 96(E29(H) 45(W31(H) 09(W44(H)  Creatinine 0.61 - 1.24 mg/dL 1.19(J1.94(H) 4.78(G2.09(H) 9.56(O3.10(H)  BUN/Creat Ratio 10 - 24 - - -  Sodium 135 - 145 mmol/L 135 134(L) 136  Potassium 3.5 - 5.1 mmol/L 5.0 4.9 4.9  Chloride 98 - 111 mmol/L 110 111 110  CO2 22 - 32 mmol/L 20(L) 17(L) 17(L)  Calcium 8.9 - 10.3 mg/dL 1.3(Y8.4(L) 8.6(V8.4(L) 8.3(L)   CBC Latest Ref Rng & Units 02/12/2021 02/11/2021 02/10/2021  WBC 4.0 - 10.5 K/uL 4.4 4.6 4.6  Hemoglobin 13.0 - 17.0 g/dL 8.2(L) 8.4(L) 7.8(L)  Hematocrit 39.0 - 52.0 % 25.8(L) 27.0(L) 24.6(L)  Platelets 150 - 400 K/uL 238 225 235   CT ABDOMEN PELVIS WO CONTRAST  Result Date: 02/11/2021 CLINICAL DATA:  Recent pyelonephritis post nephrostomy tube removal. EXAM: CT ABDOMEN  AND PELVIS WITHOUT CONTRAST TECHNIQUE: Multidetector CT imaging of the abdomen and pelvis was performed following the standard protocol without IV contrast. COMPARISON:  Choose 02/13/2021 FINDINGS: Lower chest: Moderate to large right pleural effusion. Small left pleural effusion. Enlarged heart. Minimal pericardial thickening. Hepatobiliary: 2.1 cm left hepatic cyst.  Normal gallbladder. Pancreas: Unremarkable. No pancreatic ductal dilatation or surrounding inflammatory changes. Spleen: Splenomegaly, the length of the spleen measures 14.8 cm. Adrenals/Urinary Tract: Normal adrenal glands. Right stuck corn calculus. Pigtail nephrostomy tube and the posterior right renal pelvis as before. Interval removal of left nephrostomy tube. 4.5 x 2.4 cm intermediate density collection adjacent to the left lateral renal cortex, at the site of previously nephrostomy tube. 1.6 cm bulky left renal calculus, nonobstructive. Bilateral perinephric fat stranding. Fat stranding along the proximal right ureter. Urinary bladder is normal. Stomach/Bowel: Stomach is within normal limits. No evidence of bowel wall thickening, distention, or inflammatory changes. Vascular/Lymphatic: Aortic atherosclerosis. No enlarged abdominal or pelvic lymph nodes.  Reproductive: Nonspecific prostate gland calcifications. Nonenlarged prostate. Other: No abdominal wall hernia or abnormality. No abdominopelvic ascites. Musculoskeletal: Compression fracture of L1 vertebral body with stable appearance from the prior exams with approximately 50% height loss and 5 mm bony retropulsion to the spinal canal. Multilevel osteoarthritic changes of the lumbosacral spine. IMPRESSION: 1. Interval removal of left nephrostomy tube. 4.5 x 2.4 cm intermediate density collection adjacent to the left lateral renal cortex, at the site of previously nephrostomy tube. This may represent a residual hematoma or abscess. 2. Bilateral nephrolithiasis. 3. Moderate to large right pleural effusion. Small left pleural effusion. 4. Enlarged heart. 5. Splenomegaly. 6. Stable compression fracture of L1 vertebral body with approximately 50% height loss and 5 mm bony retropulsion to the spinal canal. 7. Aortic atherosclerosis. Aortic Atherosclerosis (ICD10-I70.0). Electronically Signed   By: Ted Mcalpine M.D.   On: 02/11/2021 14:41     Discharge Instructions: Discharge Instructions     Diet - low sodium heart healthy   Complete by: As directed    Increase activity slowly   Complete by: As directed    No wound care   Complete by: As directed      Mr. Jorene Minors,  It was a pleasure taking care of you at [facility]. You were admitted for low blood pressure and treated for hypotension and acute kidney injury. We found some more fluid in your lungs that we wanted to drain, but since you refuse to stay for it, we are sending you home on a fluid pill to help get the fluid off your lungs. While we advised you to stay, we are discharging you home since you want to go home today. Please follow the following instructions.  1) Take Lasix 40 mg daily 2) Follow-up with urology tomorrow 3) Follow-up with our clinic in the middle of the week, someone will call you to schedule an appointment. 4)  Make sure you drink plenty of water to stay hydrated.   Take care,  Dr. Sharrell Ku, MD, MPH   Signed: Steffanie Rainwater, MD 02/12/2021, 9:01 PM   Pager: [phone]

## 2021-02-13 ENCOUNTER — Telehealth: Payer: Self-pay | Admitting: Family Medicine

## 2021-02-13 ENCOUNTER — Telehealth: Payer: Self-pay

## 2021-02-13 NOTE — Telephone Encounter (Signed)
Transition Care Management Follow-up Telephone Call Date of discharge and from where: 02/12/2021, [facility] How have you been since you were released from the hospital? He said that he had a rough week being in the hospital and today he went to see the urologist but he is doing okay.   Any questions or concerns? No  Items Reviewed: Did the pt receive and understand the discharge instructions provided? Yes  Medications obtained and verified? Yes  - He said that he has all of his medications and did not have any questions about his med regime.  Other? No  Any new allergies since your discharge? No  Dietary orders reviewed? Yes- Trying to drink water when he feels up to it to stay hydrated as recommended.  Do you have support at home? Yes  - He resides at the [facility] and has been staying there paying rent for 13 years.  He said that he receives support from his neighbor.   Home Care and Equipment/Supplies: Were home health services ordered? no If so, what is the name of the agency? N/a  Has the agency set up a time to come to the patient's home? not applicable Were any new equipment or medical supplies ordered?  No What is the name of the medical supply agency? N/a Were you able to get the supplies/equipment? not applicable Do you have any questions related to the use of the equipment or supplies? No  He said that his right  nephrostomy tube remains in place.  The doctor changed the dressing today and the patient empties the drainage bag twice daily.     Functional Questionnaire: (I = Independent and D = Dependent) ADLs: independent, uses cane or walker with ambulation. Explained to him that [employer] is reviewing the order for the power chair and will inform us of next steps for processing the order. He said that he has measured his doorway and room and he is confident that the power chair will fit in his room.   Follow up appointments reviewed:  PCP Hospital f/u  appt confirmed? Yes  Scheduled to see Dr Alvis Lemmings 03/01/2021.  He did not want to schedule an appointment to be seen sooner.   Specialist Hospital f/u appt confirmed? Yes  - He said that he saw the urologist today and has another appointment with urology 03/02/2021.  Are transportation arrangements needed? No  If their condition worsens, is the pt aware to call PCP or go to the Emergency Dept.? Yes Was the patient provided with contact information for the PCP's office or ED? Yes Was to pt encouraged to call back with questions or concerns? Yes

## 2021-02-13 NOTE — Telephone Encounter (Signed)
From the discharge call:   He said that he had a rough week being in the hospital and today he went to see the urologist but he is doing okay.    He said that he has all of his medications and did not have any questions about his med regime.  He said that his right  nephrostomy tube remains in place.  The doctor changed the dressing today and the patient empties the drainage bag twice daily.  uses cane or walker with ambulation. Explained to him that [employer] is reviewing the order for the power chair and will inform us of next steps for processing the order. He said that he has measured his doorway and room and he is confident that the power chair will fit in his room.  Scheduled to see Dr Alvis Lemmings 03/01/2021.  He did not want to schedule an appointment to be seen sooner.    another appointment with urology 03/02/2021.

## 2021-02-13 NOTE — Telephone Encounter (Signed)
Pt is calling to speak to Alfred Mercado. Please advise CB- [phone]

## 2021-02-23 ENCOUNTER — Telehealth: Payer: Self-pay

## 2021-02-23 NOTE — Telephone Encounter (Addendum)
Call received from Zenia Resides, [employer] regarding order for power chair.   She is sending an order for Dr Alvis Lemmings to sign for PT eval as well as documentation requirements for face to face office visit prior to PT eval.   The patient has an appointment with Dr Alvis Lemmings 03/01/2021.   Eunice Blase stated that their staff has assessed the patient in his residence and he may qualify for a more complex power chair.

## 2021-02-24 ENCOUNTER — Other Ambulatory Visit: Payer: Self-pay | Admitting: Family Medicine

## 2021-02-24 DIAGNOSIS — E559 Vitamin D deficiency, unspecified: Secondary | ICD-10-CM

## 2021-02-24 NOTE — Telephone Encounter (Signed)
My office notes from 11/30/2020 contain the required documentation for power wheelchair.  We can fax that over to them. Thanks.

## 2021-02-24 NOTE — Telephone Encounter (Signed)
Requested medications are due for refill today yes  Requested medications are on the active medication list yes  Last refill 4/7  Last visit 11/2020  Future visit scheduled 02/2021  Notes to clinic Not Delegated

## 2021-03-01 ENCOUNTER — Emergency Department (HOSPITAL_COMMUNITY): Payer: Medicare Other

## 2021-03-01 ENCOUNTER — Other Ambulatory Visit: Payer: Self-pay

## 2021-03-01 ENCOUNTER — Encounter (HOSPITAL_COMMUNITY): Payer: Self-pay | Admitting: Emergency Medicine

## 2021-03-01 ENCOUNTER — Ambulatory Visit: Payer: Medicare Other | Attending: Family Medicine | Admitting: Family Medicine

## 2021-03-01 ENCOUNTER — Inpatient Hospital Stay (HOSPITAL_COMMUNITY)
Admission: EM | Admit: 2021-03-01 | Discharge: 2021-03-17 | DRG: 698 | Disposition: A | Discharge status: Skilled Nursing Facility | Payer: Medicare Other | Attending: Internal Medicine | Admitting: Internal Medicine

## 2021-03-01 ENCOUNTER — Encounter: Payer: Self-pay | Admitting: Family Medicine

## 2021-03-01 VITALS — BP 93/56 | HR 94 | Ht 73.0 in | Wt 158.0 lb

## 2021-03-01 DIAGNOSIS — E871 Hypo-osmolality and hyponatremia: Secondary | ICD-10-CM | POA: Diagnosis present

## 2021-03-01 DIAGNOSIS — I5032 Chronic diastolic (congestive) heart failure: Secondary | ICD-10-CM | POA: Diagnosis present

## 2021-03-01 DIAGNOSIS — Z1629 Resistance to other single specified antibiotic: Secondary | ICD-10-CM | POA: Diagnosis present

## 2021-03-01 DIAGNOSIS — N200 Calculus of kidney: Secondary | ICD-10-CM

## 2021-03-01 DIAGNOSIS — N390 Urinary tract infection, site not specified: Secondary | ICD-10-CM

## 2021-03-01 DIAGNOSIS — Z7901 Long term (current) use of anticoagulants: Secondary | ICD-10-CM

## 2021-03-01 DIAGNOSIS — R400 Somnolence: Secondary | ICD-10-CM

## 2021-03-01 DIAGNOSIS — R6520 Severe sepsis without septic shock: Secondary | ICD-10-CM | POA: Diagnosis not present

## 2021-03-01 DIAGNOSIS — J449 Chronic obstructive pulmonary disease, unspecified: Secondary | ICD-10-CM | POA: Diagnosis present

## 2021-03-01 DIAGNOSIS — I130 Hypertensive heart and chronic kidney disease with heart failure and stage 1 through stage 4 chronic kidney disease, or unspecified chronic kidney disease: Secondary | ICD-10-CM | POA: Diagnosis present

## 2021-03-01 DIAGNOSIS — E872 Acidosis: Secondary | ICD-10-CM | POA: Diagnosis present

## 2021-03-01 DIAGNOSIS — I4891 Unspecified atrial fibrillation: Secondary | ICD-10-CM

## 2021-03-01 DIAGNOSIS — N151 Renal and perinephric abscess: Secondary | ICD-10-CM | POA: Diagnosis present

## 2021-03-01 DIAGNOSIS — E861 Hypovolemia: Secondary | ICD-10-CM | POA: Diagnosis present

## 2021-03-01 DIAGNOSIS — S37009A Unspecified injury of unspecified kidney, initial encounter: Secondary | ICD-10-CM

## 2021-03-01 DIAGNOSIS — F17200 Nicotine dependence, unspecified, uncomplicated: Secondary | ICD-10-CM | POA: Diagnosis present

## 2021-03-01 DIAGNOSIS — N99521 Infection of other external stoma of urinary tract: Principal | ICD-10-CM | POA: Diagnosis present

## 2021-03-01 DIAGNOSIS — D509 Iron deficiency anemia, unspecified: Secondary | ICD-10-CM | POA: Diagnosis present

## 2021-03-01 DIAGNOSIS — A419 Sepsis, unspecified organism: Secondary | ICD-10-CM | POA: Diagnosis not present

## 2021-03-01 DIAGNOSIS — N179 Acute kidney failure, unspecified: Secondary | ICD-10-CM | POA: Diagnosis present

## 2021-03-01 DIAGNOSIS — T83022A Displacement of nephrostomy catheter, initial encounter: Secondary | ICD-10-CM | POA: Diagnosis not present

## 2021-03-01 DIAGNOSIS — L0390 Cellulitis, unspecified: Secondary | ICD-10-CM | POA: Diagnosis present

## 2021-03-01 DIAGNOSIS — R296 Repeated falls: Secondary | ICD-10-CM | POA: Diagnosis present

## 2021-03-01 DIAGNOSIS — J9811 Atelectasis: Secondary | ICD-10-CM | POA: Diagnosis present

## 2021-03-01 DIAGNOSIS — L03312 Cellulitis of back [any part except buttock]: Secondary | ICD-10-CM | POA: Diagnosis present

## 2021-03-01 DIAGNOSIS — N189 Chronic kidney disease, unspecified: Secondary | ICD-10-CM | POA: Diagnosis present

## 2021-03-01 DIAGNOSIS — I4819 Other persistent atrial fibrillation: Secondary | ICD-10-CM | POA: Diagnosis present

## 2021-03-01 DIAGNOSIS — G928 Other toxic encephalopathy: Secondary | ICD-10-CM | POA: Diagnosis present

## 2021-03-01 DIAGNOSIS — E440 Moderate protein-calorie malnutrition: Secondary | ICD-10-CM | POA: Insufficient documentation

## 2021-03-01 DIAGNOSIS — I9589 Other hypotension: Secondary | ICD-10-CM

## 2021-03-01 DIAGNOSIS — Z789 Other specified health status: Secondary | ICD-10-CM

## 2021-03-01 DIAGNOSIS — E876 Hypokalemia: Secondary | ICD-10-CM | POA: Diagnosis present

## 2021-03-01 DIAGNOSIS — Y833 Surgical operation with formation of external stoma as the cause of abnormal reaction of the patient, or of later complication, without mention of misadventure at the time of the procedure: Secondary | ICD-10-CM | POA: Diagnosis present

## 2021-03-01 DIAGNOSIS — Z6820 Body mass index (BMI) 20.0-20.9, adult: Secondary | ICD-10-CM

## 2021-03-01 DIAGNOSIS — H9190 Unspecified hearing loss, unspecified ear: Secondary | ICD-10-CM | POA: Diagnosis present

## 2021-03-01 DIAGNOSIS — Z79899 Other long term (current) drug therapy: Secondary | ICD-10-CM

## 2021-03-01 DIAGNOSIS — Z20822 Contact with and (suspected) exposure to covid-19: Secondary | ICD-10-CM | POA: Diagnosis present

## 2021-03-01 DIAGNOSIS — N1832 Chronic kidney disease, stage 3b: Secondary | ICD-10-CM | POA: Diagnosis present

## 2021-03-01 LAB — COMPREHENSIVE METABOLIC PANEL
ALT: 13 U/L (ref 0–44)
AST: 17 U/L (ref 15–41)
Albumin: 3.4 g/dL — ABNORMAL LOW (ref 3.5–5.0)
Alkaline Phosphatase: 86 U/L (ref 38–126)
Anion gap: 11 (ref 5–15)
BUN: 58 mg/dL — ABNORMAL HIGH (ref 8–23)
CO2: 12 mmol/L — ABNORMAL LOW (ref 22–32)
Calcium: 9.4 mg/dL (ref 8.9–10.3)
Chloride: 110 mmol/L (ref 98–111)
Creatinine, Ser: 3.2 mg/dL — ABNORMAL HIGH (ref 0.61–1.24)
GFR, Estimated: 18 mL/min — ABNORMAL LOW (ref >60)
Glucose, Bld: 99 mg/dL (ref 70–99)
Potassium: 5.1 mmol/L (ref 3.5–5.1)
Sodium: 133 mmol/L — ABNORMAL LOW (ref 135–145)
Total Bilirubin: 0.7 mg/dL (ref 0.3–1.2)
Total Protein: 6.5 g/dL (ref 6.5–8.1)

## 2021-03-01 LAB — LACTIC ACID, PLASMA
Lactic Acid, Venous: 1.5 mmol/L (ref 0.5–1.9)
Lactic Acid, Venous: 1.1 mmol/L (ref 0.5–1.9)

## 2021-03-01 LAB — URINALYSIS, ROUTINE W REFLEX MICROSCOPIC
Bilirubin Urine: NEGATIVE
Glucose, UA: NEGATIVE mg/dL
Ketones, ur: NEGATIVE mg/dL
Nitrite: NEGATIVE
Protein, ur: 30 mg/dL — AB
RBC / HPF: >50 RBC/hpf — ABNORMAL HIGH (ref 0–5)
Specific Gravity, Urine: 1.011 (ref 1.005–1.030)
WBC, UA: >50 WBC/hpf — ABNORMAL HIGH (ref 0–5)
pH: 5 (ref 5.0–8.0)

## 2021-03-01 LAB — CBC WITH DIFFERENTIAL/PLATELET
Abs Immature Granulocytes: 0.1 10*3/uL — ABNORMAL HIGH (ref 0.00–0.07)
Basophils Absolute: 0 10*3/uL (ref 0.0–0.1)
Basophils Relative: 0 %
Eosinophils Absolute: 0.2 10*3/uL (ref 0.0–0.5)
Eosinophils Relative: 4 %
HCT: 32.6 % — ABNORMAL LOW (ref 39.0–52.0)
Hemoglobin: 10.1 g/dL — ABNORMAL LOW (ref 13.0–17.0)
Lymphocytes Relative: 14 %
Lymphs Abs: 0.9 10*3/uL (ref 0.7–4.0)
MCH: 26.9 pg (ref 26.0–34.0)
MCHC: 31 g/dL (ref 30.0–36.0)
MCV: 86.7 fL (ref 80.0–100.0)
Monocytes Absolute: 0.2 10*3/uL (ref 0.1–1.0)
Monocytes Relative: 3 %
Myelocytes: 1 %
Neutro Abs: 4.8 10*3/uL (ref 1.7–7.7)
Neutrophils Relative %: 78 %
Platelets: 232 10*3/uL (ref 150–400)
RBC: 3.76 MIL/uL — ABNORMAL LOW (ref 4.22–5.81)
RDW: 21.2 % — ABNORMAL HIGH (ref 11.5–15.5)
WBC: 6.1 10*3/uL (ref 4.0–10.5)
nRBC: 0 % (ref 0.0–0.2)
nRBC: 0 /100 WBC

## 2021-03-01 LAB — PROTIME-INR
INR: 1.3 — ABNORMAL HIGH (ref 0.8–1.2)
Prothrombin Time: 16.5 seconds — ABNORMAL HIGH (ref 11.4–15.2)

## 2021-03-01 LAB — APTT: aPTT: 33 seconds (ref 24–36)

## 2021-03-01 MED ORDER — SODIUM CHLORIDE 0.9 % IV SOLN: INTRAVENOUS | Status: DC

## 2021-03-01 MED ORDER — SODIUM CHLORIDE 0.9 % IV BOLUS (SEPSIS)
1000.0000 mL | Freq: Once | INTRAVENOUS | Status: AC
  Administered 2021-03-02: 1000 mL via INTRAVENOUS

## 2021-03-01 MED ORDER — CEFAZOLIN SODIUM-DEXTROSE 1-4 GM/50ML-% IV SOLN
1.0000 g | Freq: Once | INTRAVENOUS | Status: AC
  Administered 2021-03-02: 1 g via INTRAVENOUS
  Filled 2021-03-01: qty 50

## 2021-03-01 MED ORDER — SODIUM CHLORIDE 0.9 % IV BOLUS
500.0000 mL | Freq: Once | INTRAVENOUS | Status: AC
  Administered 2021-03-01: 500 mL via INTRAVENOUS

## 2021-03-01 NOTE — ED Provider Notes (Signed)
Emergency Medicine Provider Triage Evaluation Note  Alfred Mercado , a 85 y.o. male  was evaluated in triage.  Pt sent from the emergency department for concern of sepsis.  Patient has right nephrostomy tube.  Swelling, erythema, exquisite tenderness, and purulent discharge noted around nephrostomy tube.  Patient complains of pain around nephrostomy tube site.  Patient denies any fevers, chills, abdominal pain, nausea, vomiting, dysuria.  Review of Systems  Positive: Wound, myalgia, hematuria Negative: evers, chills, abdominal pain, nausea, vomiting, dysuria  Physical Exam  BP 101/90   Pulse 65   Temp (!) 96.4 F (35.8 C)   Resp 20   SpO2 97%  Gen:   Awake, no distress   Resp:  Normal effort, lungs clear to auscultation bilaterally MSK:   Moves extremities without difficulty  Other:  Abdomen soft, nondistended, nontender.  No guarding or rebound tenderness to abdomen.  Patient has  surrounding erythema, tenderness, warmth and purulent discharge around right nephrostomy  Medical Decision Making  Medically screening exam initiated at 1:52 PM.  Appropriate orders placed.  Alfred Mercado was informed that the remainder of the evaluation will be completed by another provider, this initial triage assessment does not replace that evaluation, and the importance of remaining in the ED until their evaluation is complete.  Will place sepsis work-up labs.  Patient will be moved back to next available room.   Haskel Schroeder, PA-C 03/01/21 1355    Arby Barrette, MD 03/02/21 [phone]

## 2021-03-01 NOTE — Progress Notes (Signed)
Subjective:  Patient ID: Alfred Mercado, male    DOB: 10/11/1935  Age: 85 y.o. MRN: [id number]  CC: Mobility exam   HPI Alfred Mercado   is an 85 year old male with a history of atrial fibrillation with RVR, heart failure with preserved ejection fraction (EF 50 to 55%), history of pleural effusion status post thoracocentesis x2 here for follow-up visit. He had declined subsequent thoracocentesis and is not interested in any future thoracocentesis.  He was hospitalized 02/09/2021 through 02/12/2021 for hypotension, acute on chronic kidney injury in the setting of right staghorn calculi and hematuria with left perinephric fluid collection.  A right nephrostomy tube was placed and he was discharged on Bactrim.  Interval History: He is accompanies by Cristobal Goldmann a neighbor today who complains the nephrostomy tube has a smell coming from it. He has an upcoming appointment with Urology tomorrow (he already had one appointment with them). He was discharged with no HHA nurse and dressing at the nephrostomy site fell off a couple of days ago. He has also been falling and sustained his laceration on his right forearm and has several bruises a couple of days ago. He has been falling off of his current wheelchair. Complains of feeling dizzy today but has not had a fever.  He is requesting a power wheelchair to assist with his ADLs due to ongoing weakness.  A scooter, walker, cane, manual wheelchair will not suffice.  He is at risk for falls and the path to his room is up an incline.  He has mental capacity and is willing to propel the wheelchair in the home safely.  He will also be referred to PT for wheelchair evaluation. Past Medical History:  Diagnosis Date   Acute heart failure with preserved ejection fraction (HCC)    Atrial fibrillation with rapid ventricular response (HCC) 07/11/2019    Past Surgical History:  Procedure Laterality Date   APPENDECTOMY     IR GUIDED DRAIN W CATHETER  PLACEMENT  01/25/2021   IR NEPHROSTOMY PLACEMENT RIGHT  01/25/2021    Family History  Problem Relation Age of Onset   Aneurysm Father     No Known Allergies  Outpatient Medications Prior to Visit  Medication Sig Dispense Refill   acetaminophen (TYLENOL) 325 MG tablet Take 650 mg by mouth every 6 (six) hours as needed for mild pain or headache.     albuterol (VENTOLIN HFA) 108 (90 Base) MCG/ACT inhaler Inhale 2 puffs into the lungs every 6 (six) hours as needed for wheezing or shortness of breath. 18 g 6   apixaban (ELIQUIS) 2.5 MG TABS tablet Take 1 tablet (2.5 mg total) by mouth 2 (two) times daily. 60 tablet 2   bisacodyl (DULCOLAX) 5 MG EC tablet Take 20 mg by mouth daily as needed for moderate constipation.     furosemide (LASIX) 40 MG tablet Take 1 tablet (40 mg total) by mouth daily. 30 tablet 1   hydrOXYzine (ATARAX/VISTARIL) 10 MG tablet Take 1 tablet (10 mg total) by mouth 3 (three) times daily as needed for itching. 30 tablet 0   metoprolol tartrate (LOPRESSOR) 25 MG tablet TAKE 1 TABLET(25 MG) BY MOUTH TWICE DAILY 60 tablet 6   Misc. Devices MISC Power wheelchair.  Diagnosis atrial fibrillation, gait abnormality 1 each 0   nicotine (NICODERM CQ - DOSED IN MG/24 HR) 7 mg/24hr patch Place 1 patch (7 mg total) onto the skin daily at 6 (six) AM. 28 patch 0   tamsulosin (FLOMAX) 0.4 MG  CAPS capsule Take 1 capsule (0.4 mg total) by mouth daily. 30 capsule 0   Tiotropium Bromide Monohydrate (SPIRIVA RESPIMAT) 2.5 MCG/ACT AERS INHALE 2 PUFFS BY MOUTH DAILY 4 g 6   Vitamin D, Ergocalciferol, (DRISDOL) 1.25 MG (50000 UNIT) CAPS capsule TAKE 1 CAPSULE BY MOUTH 1 TIME A WEEK 12 capsule 0   Facility-Administered Medications Prior to Visit  Medication Dose Route Frequency Provider Last Rate Last Admin   0.9 %  sodium chloride infusion   Intravenous Continuous Dolan Amen, MD 10 mL/hr at 02/09/21 1450 New Bag at 02/09/21 1450     ROS Review of Systems  Constitutional:  Positive for  diaphoresis. Negative for activity change and appetite change.  HENT:  Negative for sinus pressure and sore throat.   Eyes:  Negative for visual disturbance.  Respiratory:  Negative for cough, chest tightness and shortness of breath.   Cardiovascular:  Negative for chest pain and leg swelling.  Gastrointestinal:  Negative for abdominal distention, abdominal pain, constipation and diarrhea.  Endocrine: Negative.   Genitourinary:  Negative for dysuria.  Musculoskeletal:  Negative for joint swelling and myalgias.  Skin:  Negative for rash.  Allergic/Immunologic: Negative.   Neurological:  Positive for light-headedness. Negative for weakness and numbness.  Hematological:  Bruises/bleeds easily.  Psychiatric/Behavioral:  Negative for dysphoric mood and suicidal ideas.    Objective:  BP (!) 93/56   Pulse 94   Ht 6\' 1"  (1.854 m)   Wt 158 lb (71.7 kg)   SpO2 100%   BMI 20.85 kg/m   BP/Weight 03/01/2021 02/12/2021 02/09/2021  Systolic BP 93 117 111  Diastolic BP 56 68 62  Wt. (Lbs) 158 - 174  BMI 20.85 - 22.96      Physical Exam Constitutional:      Appearance: He is well-developed.  Neck:     Vascular: No JVD.  Cardiovascular:     Rate and Rhythm: Normal rate.     Heart sounds: Normal heart sounds. No murmur heard. Pulmonary:     Effort: Pulmonary effort is normal.     Breath sounds: Normal breath sounds. No wheezing or rales.  Chest:     Chest wall: No tenderness.  Abdominal:     General: Bowel sounds are normal. There is no distension.     Palpations: Abdomen is soft. There is no mass.     Tenderness: no abdominal tenderness  Musculoskeletal:        General: Normal range of motion.     Right lower leg: No edema.     Left lower leg: No edema.  Skin:    Findings: Bruising present.     Comments: Right flank with nephrostomy tube dislodged, surrounding abscess with foul smell from wound. Right forearm laceration  Neurological:     Mental Status: He is alert and oriented  to person, place, and time.  Psychiatric:        Mood and Affect: Mood normal.    CMP Latest Ref Rng & Units 02/12/2021 02/11/2021 02/10/2021  Glucose 70 - 99 mg/dL 88 02/12/2021) 95  BUN 8 - 23 mg/dL 277(O) 24(M) 35(T)  Creatinine 0.61 - 1.24 mg/dL 61(W) 4.31(V) 4.00(Q)  Sodium 135 - 145 mmol/L 135 134(L) 136  Potassium 3.5 - 5.1 mmol/L 5.0 4.9 4.9  Chloride 98 - 111 mmol/L 110 111 110  CO2 22 - 32 mmol/L 20(L) 17(L) 17(L)  Calcium 8.9 - 10.3 mg/dL 6.76(P) 9.5(K) 8.3(L)  Total Protein 6.5 - 8.1 g/dL - 5.2(L) 5.2(L)  Total  Bilirubin 0.3 - 1.2 mg/dL - 0.6 0.6  Alkaline Phos 38 - 126 U/L - 79 77  AST 15 - 41 U/L - 13(L) 12(L)  ALT 0 - 44 U/L - 10 9    Lipid Panel     Component Value Date/Time   CHOL 147 07/13/2019 0457   TRIG 77 07/13/2019 0457   HDL 38 (L) 07/13/2019 0457   CHOLHDL 3.9 07/13/2019 0457   VLDL 15 07/13/2019 0457   LDLCALC 94 07/13/2019 0457    CBC    Component Value Date/Time   WBC 4.4 02/12/2021 0104   RBC 3.13 (L) 02/12/2021 0104   HGB 8.2 (L) 02/12/2021 0104   HGB 10.1 (L) 11/30/2020 1202   HCT 25.8 (L) 02/12/2021 0104   HCT 30.6 (L) 11/30/2020 1202   PLT 238 02/12/2021 0104   PLT 346 11/30/2020 1202   MCV 82.4 02/12/2021 0104   MCV 80 11/30/2020 1202   MCH 26.2 02/12/2021 0104   MCHC 31.8 02/12/2021 0104   RDW 18.9 (H) 02/12/2021 0104   RDW 13.6 11/30/2020 1202   LYMPHSABS 0.9 02/12/2021 0104   LYMPHSABS 1.0 11/30/2020 1202   MONOABS 0.4 02/12/2021 0104   EOSABS 0.2 02/12/2021 0104   EOSABS 0.1 11/30/2020 1202   BASOSABS 0.1 02/12/2021 0104   BASOSABS 0.1 11/30/2020 1202    Lab Results  Component Value Date   HGBA1C 6.1 (H) 01/25/2021    Assessment & Plan:  1. Severe sepsis (HCC) High risk patient with risk of deterioration Source of sepsis is nephrostomy tube site with associated foul smell, discharge from abscess He is symptomatic with evidence with diaphoresis and hypotension.  Oral hydration provided EMS called STAT -  will need  hospitalization  2. Nephrostomy tube displaced [facility]) With associated abscess formation Needs evaluation for hospitalization  3. Atrial fibrillation with rapid ventricular response (HCC) Currently on Eliquis for anticoagulation and metoprolol for rate control Due to frequent falls risk versus benefit of anticoagulation will need to be reassessed  4. Frequent falls I have completed paperwork for a power wheelchair for him PT evaluation for wheelchair form also signed Case manager also called in during visit to speak with the patient and his neighbor.  Neighbor is interested in standing DPR and patient is agreeable.  We will coordinate with case manager and [employer] for power wheelchair  5. Hypotension due to hypovolemia He is hypotensive in the setting of sepsis Will benefit from a decrease Lasix dose and possibly decreased dose of metoprolol   No orders of the defined types were placed in this encounter.  50 minutes of total time spent including chart review, median intraservice time, explaining to the patient his current diagnosis and high risk of deterioration in the light of his chronic medical conditions and patient is agreeable to going to the ED.  Care coordination provided regarding evaluation for power wheelchair.  Follow-up: Return for Further evaluation, refer to ED stat.Hoy Register, MD, FAAFP. [facility] [place], [place] [phone]   03/01/2021, 12:07 PM

## 2021-03-01 NOTE — ED Triage Notes (Signed)
Pt here from MD office brought by ems for possible neph tube infection redness around site ,

## 2021-03-01 NOTE — ED Provider Notes (Signed)
[facility] EMERGENCY DEPARTMENT Provider Note   CSN: [id number] Arrival date & time: 03/01/21  1235     History No chief complaint on file.   GURTEJ NOYOLA is a 85 y.o. male.  HPI  85 year old male with past medical history of atrial fibrillation anticoagulant on Eliquis, CHF, CKD, recent admissions for right staghorn calculus with right nephrostomy tube and left perinephric collection/abscess status post IR drainage complicated by sepsis presents the emergency department with pain around the right nephrostomy tube, redness and drainage.  Patient states has been worsening for the past couple days.  He said intermittent fevers at home with chills.  Decreased appetite and mild nausea but no vomiting or diarrhea.  He continues to make his baseline amount of urine.  Denies dysuria.  No chest pain or cough/shortness of breath.  Past Medical History:  Diagnosis Date   Acute heart failure with preserved ejection fraction (HCC)    Atrial fibrillation with rapid ventricular response (HCC) 07/11/2019    Patient Active Problem List   Diagnosis Date Noted   Renal failure    Hypotension 02/09/2021   Falls 02/09/2021   Syncope and collapse 02/09/2021   Severe sepsis (HCC) 01/25/2021   Iron deficiency anemia 01/25/2021   Generalized weakness    Acute-on-chronic kidney injury (HCC) 01/24/2021   Complicated urinary tract infection 01/24/2021   Renal mass, left 01/24/2021   Staghorn renal calculus 01/24/2021   Microscopic hematuria 01/24/2021   Aortic aneurysm (HCC) 01/24/2021   Closed burst fracture of lumbar vertebra (HCC) 01/24/2021   Acute respiratory failure with hypoxia (HCC) 08/13/2020   UTI (urinary tract infection) 06/02/2020   Community acquired pneumonia of right lower lobe of lung 06/02/2020   Acute hypoxemic respiratory failure (HCC) 06/02/2020   Hypokalemia 06/02/2020   Acute diastolic CHF (congestive heart failure) (HCC) 02/06/2020   Exertional  shortness of breath 10/14/2019   Atrial fibrillation with RVR (HCC) 10/14/2019   Pleural effusion    Chronic heart failure with preserved ejection fraction [facility])     Past Surgical History:  Procedure Laterality Date   APPENDECTOMY     IR GUIDED DRAIN W CATHETER PLACEMENT  01/25/2021   IR NEPHROSTOMY PLACEMENT RIGHT  01/25/2021       Family History  Problem Relation Age of Onset   Aneurysm Father     Social History   Tobacco Use   Smoking status: Light Smoker    Pack years: 0.00   Smokeless tobacco: Never  Substance Use Topics   Alcohol use: Not Currently   Drug use: Never    Home Medications Prior to Admission medications   Medication Sig Start Date End Date Taking? Authorizing Provider  acetaminophen (TYLENOL) 325 MG tablet Take 650 mg by mouth every 6 (six) hours as needed for mild pain or headache.    [provider]  albuterol (VENTOLIN HFA) 108 (90 Base) MCG/ACT inhaler Inhale 2 puffs into the lungs every 6 (six) hours as needed for wheezing or shortness of breath. 11/30/20   Hoy Register, MD  apixaban (ELIQUIS) 2.5 MG TABS tablet Take 1 tablet (2.5 mg total) by mouth 2 (two) times daily. 02/12/21 05/13/21  Steffanie Rainwater, MD  bisacodyl (DULCOLAX) 5 MG EC tablet Take 20 mg by mouth daily as needed for moderate constipation.    [provider]  furosemide (LASIX) 40 MG tablet Take 1 tablet (40 mg total) by mouth daily. 02/12/21 05/13/21  Steffanie Rainwater, MD  hydrOXYzine (ATARAX/VISTARIL) 10 MG tablet  Take 1 tablet (10 mg total) by mouth 3 (three) times daily as needed for itching. 02/02/21   Albertha Ghee, MD  metoprolol tartrate (LOPRESSOR) 25 MG tablet TAKE 1 TABLET(25 MG) BY MOUTH TWICE DAILY 11/30/20   Hoy Register, MD  Misc. Devices MISC Power wheelchair.  Diagnosis atrial fibrillation, gait abnormality 12/01/20   Hoy Register, MD  nicotine (NICODERM CQ - DOSED IN MG/24 HR) 7 mg/24hr patch Place 1 patch (7 mg total) onto the skin daily at 6 (six)  AM. 02/03/21   Albertha Ghee, MD  tamsulosin (FLOMAX) 0.4 MG CAPS capsule Take 1 capsule (0.4 mg total) by mouth daily. 02/03/21   Albertha Ghee, MD  Tiotropium Bromide Monohydrate (SPIRIVA RESPIMAT) 2.5 MCG/ACT AERS INHALE 2 PUFFS BY MOUTH DAILY 11/30/20   Hoy Register, MD  Vitamin D, Ergocalciferol, (DRISDOL) 1.25 MG (50000 UNIT) CAPS capsule TAKE 1 CAPSULE BY MOUTH 1 TIME A WEEK 02/24/21   Hoy Register, MD    Allergies    Patient has no known allergies.  Review of Systems   Review of Systems  Constitutional:  Positive for appetite change, chills, fatigue and fever.  HENT:  Negative for congestion.   Eyes:  Negative for visual disturbance.  Respiratory:  Negative for shortness of breath.   Cardiovascular:  Negative for chest pain.  Gastrointestinal:  Negative for abdominal pain, diarrhea and vomiting.  Genitourinary:  Negative for dysuria.       Pain and drainage at nephrostomy tube site  Musculoskeletal:  Negative for neck pain.  Skin:  Negative for rash.  Neurological:  Negative for headaches.   Physical Exam Updated Vital Signs BP (!) 167/150 (BP Location: Right Arm)   Pulse 95   Temp (!) 96.4 F (35.8 C)   Resp 17   SpO2 98%   Physical Exam Vitals and nursing note reviewed.  Constitutional:      Appearance: Normal appearance.     Comments: Chronically ill appearing  HENT:     Head: Normocephalic.     Mouth/Throat:     Mouth: Mucous membranes are moist.  Cardiovascular:     Rate and Rhythm: Normal rate.  Pulmonary:     Effort: Pulmonary effort is normal. No respiratory distress.  Abdominal:     Palpations: Abdomen is soft.     Tenderness: There is no abdominal tenderness.     Comments: Right nephrostomy tube with surrounding erythema, warmth, swelling and serous pus like drainage  Skin:    General: Skin is warm.  Neurological:     Mental Status: He is alert and oriented to person, place, and time. Mental status is at baseline.     Comments: Hard of hearing   Psychiatric:        Mood and Affect: Mood normal.    ED Results / Procedures / Treatments   Labs (all labs ordered are listed, but only abnormal results are displayed) Labs Reviewed  COMPREHENSIVE METABOLIC PANEL - Abnormal; Notable for the following components:      Result Value   Sodium 133 (*)    CO2 12 (*)    BUN 58 (*)    Creatinine, Ser 3.20 (*)    Albumin 3.4 (*)    GFR, Estimated 18 (*)    All other components within normal limits  CBC WITH DIFFERENTIAL/PLATELET - Abnormal; Notable for the following components:   RBC 3.76 (*)    Hemoglobin 10.1 (*)    HCT 32.6 (*)    RDW 21.2 (*)  Abs Immature Granulocytes 0.10 (*)    All other components within normal limits  PROTIME-INR - Abnormal; Notable for the following components:   Prothrombin Time 16.5 (*)    INR 1.3 (*)    All other components within normal limits  CULTURE, BLOOD (SINGLE)  URINE CULTURE  LACTIC ACID, PLASMA  APTT  LACTIC ACID, PLASMA  URINALYSIS, ROUTINE W REFLEX MICROSCOPIC    EKG EKG Interpretation  Date/Time:  Wednesday March 01 2021 14:09:09 EDT Ventricular Rate:  90 PR Interval:    QRS Duration: 90 QT Interval:  344 QTC Calculation: 420 R Axis:   -61 Text Interpretation: Atrial fibrillation Left axis deviation Abnormal ECG Afib, similar to previous Confirmed by Coralee Pesa [phone]) on 03/01/2021 4:38:36 PM  Radiology DG Chest Port 1 View  Result Date: 03/01/2021 CLINICAL DATA:  Sepsis. Purulence discharge around nephrostomy tube. EXAM: PORTABLE CHEST 1 VIEW COMPARISON:  01/24/21 FINDINGS: Stable cardiomediastinal contours. Left lung is clear. Partially loculated right pleural effusion with overlying atelectasis is again identified right upper lobe appears clear. IMPRESSION: Unchanged appearance of right lung base with partially loculated pleural effusion and overlying atelectasis. Electronically Signed   By: Signa Kell M.D.   On: 03/01/2021 17:04    Procedures Procedures    Medications Ordered in ED Medications - No data to display  ED Course  I have reviewed the triage vital signs and the nursing notes.  Pertinent labs & imaging results that were available during my care of the patient were reviewed by me and considered in my medical decision making (see chart for details).    MDM Rules/Calculators/A&P                          85 year old male presents emergency department concern for infection around nephrostomy tube site.  Patient is afebrile on arrival.  At times tachycardic, stable blood pressure.  The nephrostomy site is wet with serous drainage and surrounding redness, induration.  The dressing was removed, he has a lot of redness associated with the dressing, probably sensitivity to adhesive but there is redness and surrounding around the actual nephrostomy site with drainage that initially looked purulent.  Blood work shows no leukocytosis, lactic is normal.  Patient has worsening AKI, urine is chronically infectious appearing.  CT shows appropriate nephrostomy placement without obvious complication.  Spoke to nephrology service, Dr. Signe Colt.  With worsening AKI and presentation plan for admission, IV antibiotics and hydration.  Patients evaluation and results requires admission for further treatment and care. Patient agrees with admission plan, offers no new complaints and is stable/unchanged at time of admit.  Final Clinical Impression(s) / ED Diagnoses Final diagnoses:  Sepsis [facility])    Rx / DC Orders ED Discharge Orders     None        Rozelle Logan, DO 03/02/21 0009

## 2021-03-01 NOTE — ED Notes (Signed)
Informed pt he cannot eat until after CT results. Placed urinal at bedside for sample. Pt is aware.

## 2021-03-01 NOTE — Hospital Course (Signed)
85 yo male recently admitted 6/16-6/19/22 for left perinephric abscess for which he underwent a nephrostomy tube placement which was removed prior to discharge. He was discharged home on a 2w course of bactrim.  Was nephrostomy tube placed again by urology after discharge?  He presented to his PCP earlier today for hospital follow up. He was noted to have foul smelling drainage from the nephrostomy tube site, and was found to be hypotension. He was subsequently transferred to the ED.   Purulent cellulitis.   Right staghorn calculus s/p nephrostomy tube placement.  AKI on CKD Mild hyponatremia NAGMA  Anemia of chronic disease  Loculated pleural effusion. Noted on June admission.

## 2021-03-02 ENCOUNTER — Inpatient Hospital Stay (HOSPITAL_COMMUNITY): Payer: Medicare Other

## 2021-03-02 ENCOUNTER — Telehealth: Payer: Self-pay

## 2021-03-02 DIAGNOSIS — N151 Renal and perinephric abscess: Secondary | ICD-10-CM | POA: Diagnosis present

## 2021-03-02 DIAGNOSIS — Y833 Surgical operation with formation of external stoma as the cause of abnormal reaction of the patient, or of later complication, without mention of misadventure at the time of the procedure: Secondary | ICD-10-CM | POA: Diagnosis present

## 2021-03-02 DIAGNOSIS — J9811 Atelectasis: Secondary | ICD-10-CM | POA: Diagnosis present

## 2021-03-02 DIAGNOSIS — E871 Hypo-osmolality and hyponatremia: Secondary | ICD-10-CM | POA: Diagnosis present

## 2021-03-02 DIAGNOSIS — R6520 Severe sepsis without septic shock: Secondary | ICD-10-CM

## 2021-03-02 DIAGNOSIS — R296 Repeated falls: Secondary | ICD-10-CM | POA: Diagnosis present

## 2021-03-02 DIAGNOSIS — Z20822 Contact with and (suspected) exposure to covid-19: Secondary | ICD-10-CM | POA: Diagnosis present

## 2021-03-02 DIAGNOSIS — Z1629 Resistance to other single specified antibiotic: Secondary | ICD-10-CM | POA: Diagnosis present

## 2021-03-02 DIAGNOSIS — Z789 Other specified health status: Secondary | ICD-10-CM | POA: Diagnosis not present

## 2021-03-02 DIAGNOSIS — N3001 Acute cystitis with hematuria: Secondary | ICD-10-CM | POA: Diagnosis not present

## 2021-03-02 DIAGNOSIS — Z515 Encounter for palliative care: Secondary | ICD-10-CM | POA: Diagnosis not present

## 2021-03-02 DIAGNOSIS — L03312 Cellulitis of back [any part except buttock]: Secondary | ICD-10-CM | POA: Diagnosis present

## 2021-03-02 DIAGNOSIS — N200 Calculus of kidney: Secondary | ICD-10-CM | POA: Diagnosis present

## 2021-03-02 DIAGNOSIS — I5032 Chronic diastolic (congestive) heart failure: Secondary | ICD-10-CM | POA: Diagnosis present

## 2021-03-02 DIAGNOSIS — J449 Chronic obstructive pulmonary disease, unspecified: Secondary | ICD-10-CM | POA: Diagnosis present

## 2021-03-02 DIAGNOSIS — E872 Acidosis: Secondary | ICD-10-CM | POA: Diagnosis present

## 2021-03-02 DIAGNOSIS — R400 Somnolence: Secondary | ICD-10-CM | POA: Diagnosis not present

## 2021-03-02 DIAGNOSIS — N1832 Chronic kidney disease, stage 3b: Secondary | ICD-10-CM | POA: Diagnosis present

## 2021-03-02 DIAGNOSIS — I130 Hypertensive heart and chronic kidney disease with heart failure and stage 1 through stage 4 chronic kidney disease, or unspecified chronic kidney disease: Secondary | ICD-10-CM | POA: Diagnosis present

## 2021-03-02 DIAGNOSIS — N179 Acute kidney failure, unspecified: Secondary | ICD-10-CM

## 2021-03-02 DIAGNOSIS — I4819 Other persistent atrial fibrillation: Secondary | ICD-10-CM | POA: Diagnosis present

## 2021-03-02 DIAGNOSIS — L0390 Cellulitis, unspecified: Secondary | ICD-10-CM | POA: Diagnosis present

## 2021-03-02 DIAGNOSIS — Z79899 Other long term (current) drug therapy: Secondary | ICD-10-CM | POA: Diagnosis not present

## 2021-03-02 DIAGNOSIS — N390 Urinary tract infection, site not specified: Secondary | ICD-10-CM | POA: Diagnosis present

## 2021-03-02 DIAGNOSIS — N99521 Infection of other external stoma of urinary tract: Secondary | ICD-10-CM | POA: Diagnosis present

## 2021-03-02 DIAGNOSIS — A419 Sepsis, unspecified organism: Secondary | ICD-10-CM | POA: Diagnosis present

## 2021-03-02 DIAGNOSIS — D509 Iron deficiency anemia, unspecified: Secondary | ICD-10-CM | POA: Diagnosis present

## 2021-03-02 DIAGNOSIS — S37009A Unspecified injury of unspecified kidney, initial encounter: Secondary | ICD-10-CM | POA: Diagnosis present

## 2021-03-02 DIAGNOSIS — E440 Moderate protein-calorie malnutrition: Secondary | ICD-10-CM | POA: Diagnosis present

## 2021-03-02 DIAGNOSIS — F17200 Nicotine dependence, unspecified, uncomplicated: Secondary | ICD-10-CM | POA: Diagnosis present

## 2021-03-02 DIAGNOSIS — G928 Other toxic encephalopathy: Secondary | ICD-10-CM | POA: Diagnosis present

## 2021-03-02 DIAGNOSIS — Z7901 Long term (current) use of anticoagulants: Secondary | ICD-10-CM | POA: Diagnosis not present

## 2021-03-02 DIAGNOSIS — Z7189 Other specified counseling: Secondary | ICD-10-CM | POA: Diagnosis not present

## 2021-03-02 LAB — BASIC METABOLIC PANEL
Anion gap: 6 (ref 5–15)
BUN: 53 mg/dL — ABNORMAL HIGH (ref 8–23)
CO2: 12 mmol/L — ABNORMAL LOW (ref 22–32)
Calcium: 8.7 mg/dL — ABNORMAL LOW (ref 8.9–10.3)
Chloride: 117 mmol/L — ABNORMAL HIGH (ref 98–111)
Creatinine, Ser: 2.75 mg/dL — ABNORMAL HIGH (ref 0.61–1.24)
GFR, Estimated: 22 mL/min — ABNORMAL LOW (ref >60)
Glucose, Bld: 98 mg/dL (ref 70–99)
Potassium: 4.2 mmol/L (ref 3.5–5.1)
Sodium: 135 mmol/L (ref 135–145)

## 2021-03-02 LAB — CBC
HCT: 27.3 % — ABNORMAL LOW (ref 39.0–52.0)
Hemoglobin: 8.9 g/dL — ABNORMAL LOW (ref 13.0–17.0)
MCH: 27.5 pg (ref 26.0–34.0)
MCHC: 32.6 g/dL (ref 30.0–36.0)
MCV: 84.3 fL (ref 80.0–100.0)
Platelets: 187 10*3/uL (ref 150–400)
RBC: 3.24 MIL/uL — ABNORMAL LOW (ref 4.22–5.81)
RDW: 20.9 % — ABNORMAL HIGH (ref 11.5–15.5)
WBC: 4.8 10*3/uL (ref 4.0–10.5)
nRBC: 0 % (ref 0.0–0.2)

## 2021-03-02 LAB — SARS CORONAVIRUS 2 (TAT 6-24 HRS): SARS Coronavirus 2: NEGATIVE

## 2021-03-02 LAB — PROCALCITONIN: Procalcitonin: <0.1 ng/mL

## 2021-03-02 MED ORDER — UMECLIDINIUM BROMIDE 62.5 MCG/INH IN AEPB
1.0000 | INHALATION_SPRAY | Freq: Every day | RESPIRATORY_TRACT | Status: DC
  Administered 2021-03-02 – 2021-03-17 (×11): 1 via RESPIRATORY_TRACT
  Filled 2021-03-02 (×3): qty 7

## 2021-03-02 MED ORDER — SODIUM CHLORIDE 0.9 % IV SOLN
2.0000 g | INTRAVENOUS | Status: DC
  Administered 2021-03-02 – 2021-03-05 (×4): 2 g via INTRAVENOUS
  Filled 2021-03-02 (×4): qty 2

## 2021-03-02 MED ORDER — TAMSULOSIN HCL 0.4 MG PO CAPS: 0.4000 mg | ORAL_CAPSULE | Freq: Every day | ORAL | Status: DC

## 2021-03-02 MED ORDER — ALBUTEROL SULFATE (2.5 MG/3ML) 0.083% IN NEBU: 2.5000 mg | INHALATION_SOLUTION | Freq: Four times a day (QID) | RESPIRATORY_TRACT | Status: DC | PRN

## 2021-03-02 MED ORDER — CEFAZOLIN SODIUM-DEXTROSE 1-4 GM/50ML-% IV SOLN
1.0000 g | Freq: Three times a day (TID) | INTRAVENOUS | Status: DC
  Administered 2021-03-02: 1 g via INTRAVENOUS
  Filled 2021-03-02: qty 50

## 2021-03-02 MED ORDER — ACETAMINOPHEN 325 MG PO TABS
650.0000 mg | ORAL_TABLET | Freq: Four times a day (QID) | ORAL | Status: DC | PRN
  Administered 2021-03-04 – 2021-03-17 (×15): 650 mg via ORAL
  Filled 2021-03-02 (×17): qty 2

## 2021-03-02 MED ORDER — TIOTROPIUM BROMIDE MONOHYDRATE 2.5 MCG/ACT IN AERS: 2.0000 | INHALATION_SPRAY | Freq: Every day | RESPIRATORY_TRACT | Status: DC

## 2021-03-02 MED ORDER — ACETAMINOPHEN 650 MG RE SUPP: 650.0000 mg | Freq: Four times a day (QID) | RECTAL | Status: DC | PRN

## 2021-03-02 MED ORDER — LACTATED RINGERS IV BOLUS
1000.0000 mL | Freq: Once | INTRAVENOUS | Status: AC
  Administered 2021-03-02: 1000 mL via INTRAVENOUS

## 2021-03-02 MED ORDER — APIXABAN 2.5 MG PO TABS
2.5000 mg | ORAL_TABLET | Freq: Two times a day (BID) | ORAL | Status: DC
  Administered 2021-03-02: 2.5 mg via ORAL
  Filled 2021-03-02 (×3): qty 1

## 2021-03-02 MED ORDER — NICOTINE 7 MG/24HR TD PT24
7.0000 mg | MEDICATED_PATCH | Freq: Every day | TRANSDERMAL | Status: DC
  Administered 2021-03-02 – 2021-03-17 (×14): 7 mg via TRANSDERMAL
  Filled 2021-03-02 (×16): qty 1

## 2021-03-02 NOTE — ED Notes (Signed)
Full linen change done and pt resting with eyes closed at this time

## 2021-03-02 NOTE — Progress Notes (Signed)
Internal Medicine Attending Note:  Patient is an 85 year old male with a past medical history of HFpEF, persistent atrial fibrillation, COPD, CKD IIIb, and multiple recent admissions for complicated UTI with bilateral pyelonephritis requiring nephrostomy tubes. He was found to have a right staghorn calculus with gas scattered in the right renal collecting system and a left perinephric abscess. His left nephrostomy tube was removed prior to discharge on 02/02/21 and he was discharged on a two week course of bactrim with his right nephrostomy tube still in place. He was readmitted on 02/10/2019 with hypotension.  Repeat CT abdomen and pelvis showed interval development of a 4.5 x 2.4 cm fluid collection adjacent to the left lateral renal cortex concerning for possible hematoma or abscess. Urology was reconsulted however did not see patient while in the hospital.  They reviewed CT images and felt this fluid collection was most likely a hematoma and recommended outpatient follow-up.  He was also noted to have an enlarging right pleural effusion that admission which was felt to be due to IVFs in the setting of his HFpEF.  He declined thoracentesis.  He was adamant about discharging home without further in-hospital work-up, and was instructed to follow-up with his outpatient urologist and PCP.    Patient was readmitted yesterday with dizziness and increased pain at his right nephrostomy tube site per resident report.  On arrival he was hypothermic with temperature 96.4, tachycardic, and hypotensive with pressures 90s /60s.  Repeat CT abdomen and pelvis demonstrated his indwelling right percutaneous nephrostomy tube, his known right-sided staghorn calculus and 53mm left-sided nephrolithiasis which was seen on prior CT abdomen and pelvis, but no evidence of current pyelonephritis, hydronephrosis, or abscess. No mention of the prior left perinephric fluid collection. Chest x-ray showed a partially loculated right pleural  effusion that looks unchanged from prior imaging on my read.  He was admitted to our service and started on IV cefazolin for UTI and nephrostomy site cellulitis.  On my evaluation this morning, patient is altered.  He is somnolent and falling asleep during conversation and examination.  Hypotensive on telemetry with a MAP of 63.  He becomes very agitated when woken up and yells at me to "leave me the fuck alone" and "don't fucking touch me". I was unable to perform a thorough physical exam. He was laying on his right side and I could not evaluate his nephrostomy tube site.  Dr. Barbaraann Faster and myself attempted to address CODE STATUS and goals of care, unfortunately he refused to participate in conversation and with his encephalopathy, I do not think he has medical decision-making capacity currently.  Assessment and Plan:   # Encephalopathy # Possible Sepsis  # UTI  Suspect toxic-metabolic encephalopathy related to possible sepsis and UTI.  Urine cultures are pending, blood cultures are NG < 24 hours. He has a recent history of UTI complicated by bilateral pyelonephritis requiring bilateral nephrostomy tubes (emphysematous changes on the right with staghorn calculus and left perinephric abscess). Fortunately, imaging does not show any evidence of upper urinary tract involvement this admission, although his right nephrostomy tube is still in place. Given persistent AMS and hypotension, we have broadened his antibiotics to cefepime based on prior culture sensitives. Cultures on 01/25/21 from his perinephric abscess grew enterobacter resistant to cefazolin. He would likely benefit nephrostomy tube exchange but I doubt he would cooperate with his current mental status. Will hopefully stabilize him with IVFs and antibiotics, consider IR / urology consults tomorrow if AMS is improved.   #  Right loculated pleural effusion Per chart review, this is chronic and has previously been worked up. First noted in November of  2020 after admission for SOB, found to have new onset a-fib and evidence of pulmonary edema with bilateral pleural effusions (R>L). He was diuresed for new onset HFpEF and underwent right sided thoracentesis with 1.5L of clear fluid removed. Fluid analysis was consistent with a transudative effusion. Cultures were negative. Cytology without malignant cells.  He was readmitted three months later with HFpEF exacerbation after running out of his lasix with reaccumulation of his right sided effusion. He underwent a second thoracentesis with 1.4L of fluid removed. Analysis was again consistent with a transudate and cultures were negative. It does not look like cytology was sent the second time. I do not think this is driving his sepsis, but if patient does not improve with antibiotics may need to consider repeat thoracentesis vs chest tube.  He is in no respiratory distress and oxygenating 100% on room air.  # AKI on CKD IIIb: Improved with IVFs. Giving additional bolus today. No evidence of hydronephrosis on CT abdomen. Check bladder scan. Strict I&Os.  # Persistent Atrial Fibrillation  # HFpEF I am holding Eliquis for now in case nephrostomy tube exchange is needed. Currently rate controlled. Holding lasix with hypotension.    [name], MD 03/02/2021, 9:38 AM

## 2021-03-02 NOTE — Progress Notes (Signed)
Pt admitted to 5W28 from ED. A&O x1, self. VS stable, Tele monitor placed and verified. Skin intact except where otherwise charted. Pt wishes to be left alone, call bell within reach. Will continue to monitor.    03/02/21 1737  Vitals  Temp 98 F (36.7 C)  Temp Source Axillary  BP 120/72  MAP (mmHg) 81  BP Location Left Arm  BP Method Automatic  Patient Position (if appropriate) Lying  ECG Heart Rate 91  Level of Consciousness  Level of Consciousness Alert  MEWS COLOR  MEWS Score Color Green  Oxygen Therapy  SpO2 100 %  O2 Device Room Air  Pain Assessment  Pain Score 0  MEWS Score  MEWS Temp 0  MEWS Systolic 0  MEWS Pulse 0  MEWS RR 0  MEWS LOC 0  MEWS Score 0

## 2021-03-02 NOTE — ED Notes (Signed)
Pt refuses to go to CT of his head at this time, states he just wanted to sleep.

## 2021-03-02 NOTE — Progress Notes (Signed)
Pharmacy Antibiotic Note  Alfred Mercado is a 85 y.o. male admitted on 03/01/2021 with  IAI .  Pharmacy has been consulted for Cefepime dosing.  Plan: Cefepime 2g IV q24h -Monitor renal function, clinical status, and antibiotic plan  Temp (24hrs), Avg:97.4 F (36.3 C), Min:96.4 F (35.8 C), Max:98.4 F (36.9 C)  Recent Labs  Lab 03/01/21 1352 03/01/21 1802 03/02/21 0330  WBC 6.1  --  4.8  CREATININE 3.20*  --  2.75*  LATICACIDVEN 1.5 1.1  --     Estimated Creatinine Clearance: 20.6 mL/min (A) (by C-G formula based on SCr of 2.75 mg/dL (H)).    No Known Allergies  Antimicrobials this admission: Cefepime 7/7 >>  Cefazolin 7/6 x 2 doses  Microbiology results: 7/6 BCx: NGTD 7/6 UCx: pending   Loleta Dicker, PharmD, [facility] Emergency Medicine Clinical Pharmacist ED RPh Phone: [phone] Main RX: [phone]

## 2021-03-02 NOTE — Sepsis Progress Note (Signed)
Following for sepsis monitoring ?

## 2021-03-02 NOTE — ED Notes (Signed)
Report given to Cameron 

## 2021-03-02 NOTE — Progress Notes (Signed)
Patient ID: Alfred Mercado, male   DOB: 1936-05-23, 85 y.o.   MRN: [id number]      Thank you for the consult. Chart reviewed. Attempted to see patient at bedside. He is very lethargic and unable to participate in goals of care discussion at this time. No contact information on file for family or friends.  Please re-consult PMT when patient's mental status improves.     Sherlean Foot, NP-C Palliative Medicine   Please call Palliative Medicine team phone with any questions [phone]. For individual providers please see AMION.   No charge

## 2021-03-02 NOTE — Telephone Encounter (Signed)
Order for PT eval for power chair and provider's note addressing the need for the power chair were faxed to [facility] as requested.

## 2021-03-02 NOTE — H&P (Addendum)
Date: 03/02/2021               Patient Name:  Alfred Mercado MRN: [id number]  DOB: 1936/03/22 Age / Sex: 85 y.o., male   PCP: Hoy Register, MD         Medical Service: Internal Medicine Teaching Service         Attending Physician: Dr. [name], MD    First Contact: Dr. Alroy Bailiff Pager: [phone]  Second Contact: Dr. Barbaraann Faster Pager: [phone]       After Hours (After 5p/  First Contact Pager: [phone]  weekends / holidays): Second Contact Pager: [phone]   Chief Complaint: Dizziness and increased pain at nephrostomy tube site.  History of Present Illness: Alfred Mercado is a 85 year old with past medical history of HFpEF, Atrial Fibrillation, Pleural Effusion, tobacco use, iron deficiency anemia, COPD, CKD3, recent history of complicated UTI with perinephric abscess, staghorn calculi with right percutaneous nephrostomy tube placed 6/1 and recent admission with dehydration/ hypotension on 6/17.  Patient presents 7/6 from [facility] due to hypotension, dizziness, and pus present around nephrostomy tube resulting in concern for sepsis.  Patient states that he has noticed some increased pain at the nephrostomy tube site, but it is intermittent and he is able to ignore it.  The dressing for his site fell off a few days ago. On chart review, patient was unable to receive Home Health for care of nephrostomy tube due to living in a motel and he refused SNF placement at discharge 6/19. He states that he has not been eating or drinking much following discharge from hospital 6/19.  He has been less stable on his feet for the past few weeks and has fallen due to this.    Patient denies fever, chills, shortness of breath or dysuria.  He has noticed decreased urine output, increased weakness, frequent dizziness and malaise.   Meds:  Current Facility-Administered Medications for the 03/01/21 encounter [facility] Encounter)  Medication   0.9 %  sodium chloride infusion   Current Meds   Medication Sig   acetaminophen (TYLENOL) 325 MG tablet Take 650 mg by mouth at bedtime.   albuterol (VENTOLIN HFA) 108 (90 Base) MCG/ACT inhaler Inhale 2 puffs into the lungs every 6 (six) hours as needed for wheezing or shortness of breath.   apixaban (ELIQUIS) 2.5 MG TABS tablet Take 1 tablet (2.5 mg total) by mouth 2 (two) times daily.   bisacodyl (DULCOLAX) 5 MG EC tablet Take 20 mg by mouth daily as needed for moderate constipation.   diltiazem (CARDIZEM CD) 120 MG 24 hr capsule Take 120 mg by mouth daily.   furosemide (LASIX) 40 MG tablet Take 1 tablet (40 mg total) by mouth daily.   hydrOXYzine (ATARAX/VISTARIL) 10 MG tablet Take 1 tablet (10 mg total) by mouth 3 (three) times daily as needed for itching.   metoprolol tartrate (LOPRESSOR) 25 MG tablet TAKE 1 TABLET(25 MG) BY MOUTH TWICE DAILY (Patient taking differently: Take 25 mg by mouth 2 (two) times daily.)   nicotine (NICODERM CQ - DOSED IN MG/24 HR) 7 mg/24hr patch Place 1 patch (7 mg total) onto the skin daily at 6 (six) AM.   OVER THE COUNTER MEDICATION Take 1-2 tablets by mouth at bedtime. Sleeping pill   polyethylene glycol powder (GLYCOLAX/MIRALAX) 17 GM/SCOOP powder Take 17 g by mouth daily as needed for mild constipation.   tamsulosin (FLOMAX) 0.4 MG CAPS capsule Take 1 capsule (0.4 mg total) by mouth daily.  Tiotropium Bromide Monohydrate (SPIRIVA RESPIMAT) 2.5 MCG/ACT AERS INHALE 2 PUFFS BY MOUTH DAILY (Patient taking differently: Inhale 2 puffs into the lungs daily.)   Vitamin D, Ergocalciferol, (DRISDOL) 1.25 MG (50000 UNIT) CAPS capsule TAKE 1 CAPSULE BY MOUTH 1 TIME A WEEK (Patient taking differently: Take 50,000 Units by mouth every Saturday at 6 PM.)     Allergies: Allergies as of 03/01/2021   (No Known Allergies)   Past Medical History:  Diagnosis Date   Acute heart failure with preserved ejection fraction (HCC)    Atrial fibrillation with rapid ventricular response (HCC) 07/11/2019    Family History:   Family History  Problem Relation Age of Onset   Aneurysm Father      Social History: patient is a current smoker.  He states he used to drink EtOH but stopped about 4 years ago.  Denies illicit drug use.  He lives alone in a motel in [place].  His neighbor, Cristobal Goldmann, assists him with going to doctors appointments.  He has difficulty completing ADLs due to weakness.   Review of Systems: A complete ROS was negative except as per HPI.  Physical Exam: Blood pressure 95/61, pulse 81, temperature (!) 96.4 F (35.8 C), resp. rate 19, SpO2 100 %. Gen: Chronically, ill-appearing,  Skin: poor skin turgor, bruising noted on forearms bilaterally, forearm laceration noted on right forearm. HENT: Hard of hearing at baseline, worse on left side.  Mucous membrane moist Eyes: conjunctiva clear, slera non-icteric Heart: Irregular rhythm, no murmur or gallop noted Lungs: CTAB, Pulmonary effort normal Back: Right flank with nephrostomy tube in place with surrounding erythema, warmth and swelling and serous drainage in collection bag. No obvious drainage surrounding the nephrostomy tube site.Tender to palpation surrounding tube. Abdomen: bowel sounds normal, no tenderness Neuro: alert, Oriented x2 (unsure of date). CN III-XII grossly intact. Moves all extremities equally.  Extrm: peripheral pulses intact, no peripheral edema Psych: Mood and affect normal, seems to have insight into his chronic health conditions.  EKG: atrial fibrillation as seen in previous EKGs.  CT chest: IMPRESSION: Unchanged appearance of right lung base with partially loculated pleural effusion and overlying atelectasis.  CT abd/ pelvis: IMPRESSION: Indwelling right percutaneous nephrostomy. Fragmented right staghorn calculus, including a dominant 4.7 cm calculus in the right proximal collecting system. No hydronephrosis.   16 mm posterior left upper pole renal calculus.  No hydronephrosis.   Small loculated right  pleural effusion, improved.  CT Head: pending   Assessment & Plan by Problem: Principal Problem:   Sepsis (HCC) Active Problems:   Chronic heart failure with preserved ejection fraction (HCC)   Acute-on-chronic kidney injury (HCC)   Staghorn renal calculus   Cellulitis   Hyponatremia  Alfred Mercado is an 85 year old with past medical history of HFpEF, Atrial Fibrillation, Pleural Effusion, tobacco use, iron deficiency anemia, COPD, CKD3, recent history of complicated UTI with perinephric abscess, staghorn calculi with right percutaneous nephrostomy tube placed 6/1 and recent admission with dehydration/ hypotension on 6/17.   Sepsis secondary to non-purulent cellulitis Blood pressure on presentation was 93/56, MAP of 68 mmHg, tachycardia, Temperature of 96.4 F.   WBC= 6.1   Lactate 1.5->1.1 Patient appears dry on exam, fluids initiated -Started Cefazolin for cellulitis. -Follow-up on procalcitonin, blood culture  Acute Kidney Injury on CKD IIIa Presents with Creatinine 3.70, was 1.94 at discharge 02/12/21 -Thinking this is likely of pre-renal etiology. Hypovolemia could be due to decreased intake vs sepsis. -Fluids initiated. -Follow up on BMP -EDP spoke with  nephrology who recommended admission  Persistent A. Fib On metoprolol, eliquis at home. CHADVASC2 score of 4 (age, CHF, HTN).  -Holding Metoprolol for now  History of recurrent falls while on anticoagulation. No focal neurologic deficits on admission. -F/u on CT head wo contrast  Right Staghorn Calculus History of staghorn calculus for several years. R Nephrostomy tube was placed 01/25/21, following with urology outpatient.  Next outpatient scheduled for 03/02/21. -Holding outpatient flomax 0.4mg  daily for now given low BP.  Watch for retention.  Hematuria UA= >50 RBC/hpf, with leukocytes, WBC >50, and few bacteria.  No complaints of dysuria, will follow up on urine culture. Given his history of MDRO UTIs and lack of  urinary symptoms, will defer antibiotics for now. Recent history of complicated UTI w left pyelonephritis abscess that was drained by IR.  Also had hematuria in recent hospital admission and he was to follow up with urology outpatient.  Iron Deficiency Anemia Hbg of 10.1 03/02/21, at his baseline from chart review.    HFpEF On Furosemide 40 mg daily, will restart once BP increases. -EF 50-55% 07/16/20  Pleural Effusion s/p thoracentesis CT chest: IMPRESSION: Unchanged appearance of right lung base with partially loculated pleural effusion and overlying atelectasis.  COPD -uses Spiriva repimat 2.5 mcg daily   Tobacco use -Started nicotine patch  Diet: soft IVF:NS VTE: NOAC Code: Full  Dispo: Admit patient to Inpatient with expected length of stay greater than 2 midnights.  Signed: Rudene Christians, DO 03/02/2021, 1:35 AM  Pager: [phone] After 5pm on weekdays and 1pm on weekends: On Call pager: [phone]

## 2021-03-03 ENCOUNTER — Encounter (HOSPITAL_COMMUNITY): Payer: Self-pay | Admitting: Internal Medicine

## 2021-03-03 DIAGNOSIS — A419 Sepsis, unspecified organism: Secondary | ICD-10-CM | POA: Diagnosis not present

## 2021-03-03 DIAGNOSIS — N200 Calculus of kidney: Secondary | ICD-10-CM | POA: Diagnosis not present

## 2021-03-03 DIAGNOSIS — N390 Urinary tract infection, site not specified: Secondary | ICD-10-CM | POA: Diagnosis not present

## 2021-03-03 DIAGNOSIS — R6520 Severe sepsis without septic shock: Secondary | ICD-10-CM | POA: Diagnosis not present

## 2021-03-03 LAB — CBC WITH DIFFERENTIAL/PLATELET
Abs Immature Granulocytes: 0 10*3/uL (ref 0.00–0.07)
Basophils Absolute: 0.2 10*3/uL — ABNORMAL HIGH (ref 0.0–0.1)
Basophils Relative: 3 %
Eosinophils Absolute: 0.1 10*3/uL (ref 0.0–0.5)
Eosinophils Relative: 2 %
HCT: 28.7 % — ABNORMAL LOW (ref 39.0–52.0)
Hemoglobin: 9.4 g/dL — ABNORMAL LOW (ref 13.0–17.0)
Lymphocytes Relative: 13 %
Lymphs Abs: 0.7 10*3/uL (ref 0.7–4.0)
MCH: 27.5 pg (ref 26.0–34.0)
MCHC: 32.8 g/dL (ref 30.0–36.0)
MCV: 83.9 fL (ref 80.0–100.0)
Monocytes Absolute: 0.1 10*3/uL (ref 0.1–1.0)
Monocytes Relative: 1 %
Neutro Abs: 4.5 10*3/uL (ref 1.7–7.7)
Neutrophils Relative %: 81 %
Platelets: 216 10*3/uL (ref 150–400)
RBC: 3.42 MIL/uL — ABNORMAL LOW (ref 4.22–5.81)
RDW: 21.1 % — ABNORMAL HIGH (ref 11.5–15.5)
WBC: 5.5 10*3/uL (ref 4.0–10.5)
nRBC: 0 % (ref 0.0–0.2)
nRBC: 0 /100 WBC

## 2021-03-03 LAB — BASIC METABOLIC PANEL
Anion gap: 5 (ref 5–15)
BUN: 37 mg/dL — ABNORMAL HIGH (ref 8–23)
CO2: 13 mmol/L — ABNORMAL LOW (ref 22–32)
Calcium: 9 mg/dL (ref 8.9–10.3)
Chloride: 118 mmol/L — ABNORMAL HIGH (ref 98–111)
Creatinine, Ser: 2.05 mg/dL — ABNORMAL HIGH (ref 0.61–1.24)
GFR, Estimated: 32 mL/min — ABNORMAL LOW (ref >60)
Glucose, Bld: 96 mg/dL (ref 70–99)
Potassium: 3.9 mmol/L (ref 3.5–5.1)
Sodium: 136 mmol/L (ref 135–145)

## 2021-03-03 MED ORDER — VANCOMYCIN HCL 500 MG/100ML IV SOLN
500.0000 mg | INTRAVENOUS | Status: DC
  Administered 2021-03-04 – 2021-03-05 (×2): 500 mg via INTRAVENOUS
  Filled 2021-03-03 (×2): qty 100

## 2021-03-03 MED ORDER — MELATONIN 3 MG PO TABS
3.0000 mg | ORAL_TABLET | Freq: Once | ORAL | Status: AC
  Administered 2021-03-03: 3 mg via ORAL
  Filled 2021-03-03: qty 1

## 2021-03-03 MED ORDER — MELATONIN 3 MG PO TABS: 3.0000 mg | ORAL_TABLET | Freq: Once | ORAL | Status: DC

## 2021-03-03 MED ORDER — MELATONIN 3 MG PO TABS: 3.0000 mg | ORAL_TABLET | Freq: Every day | ORAL | Status: DC

## 2021-03-03 MED ORDER — HYDROMORPHONE HCL 1 MG/ML IJ SOLN
0.5000 mg | Freq: Once | INTRAMUSCULAR | Status: AC | PRN
  Administered 2021-03-03: 0.5 mg via INTRAVENOUS
  Filled 2021-03-03: qty 0.5

## 2021-03-03 MED ORDER — VANCOMYCIN HCL 1500 MG/300ML IV SOLN
1500.0000 mg | INTRAVENOUS | Status: AC
  Administered 2021-03-03: 1500 mg via INTRAVENOUS
  Filled 2021-03-03: qty 300

## 2021-03-03 NOTE — H&P (Signed)
Chief Complaint: Patient was seen in consultation today for image guided right PCN exchange at the request of Dr. Barbaraann Faster  Referring Physician(s): Dr. Barbaraann Faster, J.  Supervising Physician: Marliss Coots  Patient Status: [facility] - In-pt  History of Present Illness: Alfred Mercado is a 85 y.o. male with PMH of A. fib on Eliquis, acute heart failure, large right staghorn horn calculus within right kidney and left-sided perinephric fluid collection s/p right PCN and left perinephric fluid collection drain placement with IR on 01/25/2021.   Patient was hospitalized at [facility] from 01/25/2021 to 02/12/2021 due to hypotension, right staghorn kidney stone with hematuria, acute on chronic kidney disease, and pleural effusion.  The left perinephric drain was removed during the admission, patient was discharged with right PCN with follow-up with urology. Patient unfortunately represented to [facility] ED on 03/01/2021 due to pain around the right PCN and intermittent fever.  Labs revealed worsening AKI and chronic UTI, nephrology was consulted and patient was admitted for further evaluation and management.   During admission, patient became agitated and AMS, primary team suspecting toxic metabolic encephalopathy related to possible sepsis and UTI.  Blood cultures pending.  Due to concern for systemic infection, IR was requested for exchange of right PCN tube.  Patient laying in bed, not in acute distress.  Denise headache, fever, chills, shortness of breath, cough, chest pain, abdominal pain, nausea ,vomiting, and bleeding.   Past Medical History:  Diagnosis Date   Acute heart failure with preserved ejection fraction (HCC)    Atrial fibrillation with rapid ventricular response (HCC) 07/11/2019    Past Surgical History:  Procedure Laterality Date   APPENDECTOMY     IR GUIDED DRAIN W CATHETER PLACEMENT  01/25/2021   IR NEPHROSTOMY PLACEMENT RIGHT  01/25/2021    Allergies: Patient has no known  allergies.  Medications: Prior to Admission medications   Medication Sig Start Date End Date Taking? Authorizing Provider  acetaminophen (TYLENOL) 325 MG tablet Take 650 mg by mouth at bedtime.   Yes [provider]  albuterol (VENTOLIN HFA) 108 (90 Base) MCG/ACT inhaler Inhale 2 puffs into the lungs every 6 (six) hours as needed for wheezing or shortness of breath. 11/30/20  Yes Hoy Register, MD  apixaban (ELIQUIS) 2.5 MG TABS tablet Take 1 tablet (2.5 mg total) by mouth 2 (two) times daily. 02/12/21 05/13/21 Yes Amponsah, Flossie Buffy, MD  bisacodyl (DULCOLAX) 5 MG EC tablet Take 20 mg by mouth daily as needed for moderate constipation.   Yes [provider]  diltiazem (CARDIZEM CD) 120 MG 24 hr capsule Take 120 mg by mouth daily. 02/23/21  Yes [provider]  furosemide (LASIX) 40 MG tablet Take 1 tablet (40 mg total) by mouth daily. 02/12/21 05/13/21 Yes Steffanie Rainwater, MD  hydrOXYzine (ATARAX/VISTARIL) 10 MG tablet Take 1 tablet (10 mg total) by mouth 3 (three) times daily as needed for itching. 02/02/21  Yes Albertha Ghee, MD  metoprolol tartrate (LOPRESSOR) 25 MG tablet TAKE 1 TABLET(25 MG) BY MOUTH TWICE DAILY Patient taking differently: Take 25 mg by mouth 2 (two) times daily. 11/30/20  Yes Hoy Register, MD  nicotine (NICODERM CQ - DOSED IN MG/24 HR) 7 mg/24hr patch Place 1 patch (7 mg total) onto the skin daily at 6 (six) AM. 02/03/21  Yes Albertha Ghee, MD  OVER THE COUNTER MEDICATION Take 1-2 tablets by mouth at bedtime. Sleeping pill   Yes [provider]  polyethylene glycol powder (GLYCOLAX/MIRALAX) 17 GM/SCOOP powder Take 17  g by mouth daily as needed for mild constipation.   Yes [provider]  tamsulosin (FLOMAX) 0.4 MG CAPS capsule Take 1 capsule (0.4 mg total) by mouth daily. 02/03/21  Yes Albertha Ghee, MD  Tiotropium Bromide Monohydrate (SPIRIVA RESPIMAT) 2.5 MCG/ACT AERS INHALE 2 PUFFS BY MOUTH DAILY Patient taking differently: Inhale 2  puffs into the lungs daily. 11/30/20  Yes Hoy Register, MD  Vitamin D, Ergocalciferol, (DRISDOL) 1.25 MG (50000 UNIT) CAPS capsule TAKE 1 CAPSULE BY MOUTH 1 TIME A WEEK Patient taking differently: Take 50,000 Units by mouth every Saturday at 6 PM. 02/24/21  Yes Hoy Register, MD  Misc. Devices MISC Power wheelchair.  Diagnosis atrial fibrillation, gait abnormality 12/01/20   Hoy Register, MD     Family History  Problem Relation Age of Onset   Aneurysm Father     Social History   Socioeconomic History   Marital status: Divorced    Spouse name: Not on file   Number of children: Not on file   Years of education: Not on file   Highest education level: Not on file  Occupational History   Not on file  Tobacco Use   Smoking status: Light Smoker    Pack years: 0.00   Smokeless tobacco: Never  Substance and Sexual Activity   Alcohol use: Not Currently   Drug use: Never   Sexual activity: Not Currently  Other Topics Concern   Not on file  Social History Narrative   Not on file   Social Determinants of Health   Financial Resource Strain: Not on file  Food Insecurity: No Food Insecurity   Worried About Running Out of Food in the Last Year: Never true   Ran Out of Food in the Last Year: Never true  Transportation Needs: No Transportation Needs   Lack of Transportation (Medical): No   Lack of Transportation (Non-Medical): No  Physical Activity: Not on file  Stress: Not on file  Social Connections: Not on file     Review of Systems: A 12 point ROS discussed and pertinent positives are indicated in the HPI above.  All other systems are negative.   Vital Signs: BP 103/71 (BP Location: Left Arm)   Pulse 72   Temp (!) 97.5 F (36.4 C) (Oral)   Resp 20   Wt 159 lb 9.8 oz (72.4 kg)   SpO2 95%   BMI 21.06 kg/m   Physical Exam  Vitals and nursing note reviewed.  Constitutional:      General: He is not in acute distress.    Appearance: chronically ill.  HENT:     Head:  Normocephalic and atraumatic.     Mouth/Throat:     Mouth: Mucous membranes are moist.     Pharynx: Oropharynx is clear.  Cardiovascular:     Rate and Rhythm: Normal rate and regular rhythm.     Pulses: Normal pulses.     Heart sounds: Normal heart sounds.  Pulmonary:     Effort: Pulmonary effort is normal.     Breath sounds: Normal breath sounds. No wheezing, rhonchi or rales.  Abdominal:     General: Bowel sounds are normal. There is no distension.     Palpations: Abdomen is soft.  Skin:    General: Skin is warm and dry. Positive for right PCN tube   Neurological:     Mental Status: He is alert and oriented to person, place, and time.  Psychiatric:        Mood and Affect:  Mood normal.        Behavior: Behavior normal.    MD Evaluation Airway: WNL Heart: WNL Abdomen: WNL Chest/ Lungs: WNL ASA  Classification: 2 Mallampati/Airway Score: Three  Imaging: CT Abdomen Pelvis Wo Contrast  Result Date: 03/01/2021 CLINICAL DATA:  Abdominal pain, right nephrostomy tube regress EXAM: CT ABDOMEN AND PELVIS WITHOUT CONTRAST TECHNIQUE: Multidetector CT imaging of the abdomen and pelvis was performed following the standard protocol without IV contrast. COMPARISON:  02/11/2021 FINDINGS: Lower chest: Small right pleural effusion, partially loculated, improved. Rounded atelectasis in the lateral right lower lung and right lower lobe. Hepatobiliary: Unenhanced liver is notable for a 2.3 cm central left hepatic lobe cyst (series 3/image 18). Gallbladder is notable for layering sludge (series 3/image 33). No associated inflammatory changes. No intrahepatic or extrahepatic ductal dilatation. Pancreas: Within normal limits. Spleen: Within normal limits. Adrenals/Urinary Tract: Adrenal glands are within normal limits. Right kidney is notable for an indwelling percutaneous nephrostomy with fragmented staghorn calculus, including a dominant 4.7 cm calculus in the right proximal collecting system (coronal  image 92). No hydronephrosis. Left kidney is notable for a 16 mm posterior left upper pole calculus (series 3/image 42) and an overlying 4.1 cm localized perinephric collection in the setting of prior intervention (series 3/image 45). No hydronephrosis. Bladder is within normal limits. Stomach/Bowel: Stomach is within normal limits. No evidence of bowel obstruction. Appendix is not discretely visualized. Vascular/Lymphatic: No evidence of abdominal aortic aneurysm. Atherosclerotic calcifications of the abdominal aorta and branch vessels. No suspicious abdominopelvic lymphadenopathy. Reproductive: Prostate is notable for dystrophic calcifications. Other: No abdominopelvic ascites. Prior presacral fluid/stranding has resolved. Small fat containing left inguinal hernia. Musculoskeletal: Moderate compression fracture deformity at L1, stable. Mild degenerative changes of the visualized thoracolumbar spine. IMPRESSION: Indwelling right percutaneous nephrostomy. Fragmented right staghorn calculus, including a dominant 4.7 cm calculus in the right proximal collecting system. No hydronephrosis. 16 mm posterior left upper pole renal calculus.  No hydronephrosis. Small loculated right pleural effusion, improved. Electronically Signed   By: Charline Bills M.D.   On: 03/01/2021 19:39   CT ABDOMEN PELVIS WO CONTRAST  Result Date: 02/11/2021 CLINICAL DATA:  Recent pyelonephritis post nephrostomy tube removal. EXAM: CT ABDOMEN AND PELVIS WITHOUT CONTRAST TECHNIQUE: Multidetector CT imaging of the abdomen and pelvis was performed following the standard protocol without IV contrast. COMPARISON:  Choose 02/13/2021 FINDINGS: Lower chest: Moderate to large right pleural effusion. Small left pleural effusion. Enlarged heart. Minimal pericardial thickening. Hepatobiliary: 2.1 cm left hepatic cyst.  Normal gallbladder. Pancreas: Unremarkable. No pancreatic ductal dilatation or surrounding inflammatory changes. Spleen: Splenomegaly,  the length of the spleen measures 14.8 cm. Adrenals/Urinary Tract: Normal adrenal glands. Right stuck corn calculus. Pigtail nephrostomy tube and the posterior right renal pelvis as before. Interval removal of left nephrostomy tube. 4.5 x 2.4 cm intermediate density collection adjacent to the left lateral renal cortex, at the site of previously nephrostomy tube. 1.6 cm bulky left renal calculus, nonobstructive. Bilateral perinephric fat stranding. Fat stranding along the proximal right ureter. Urinary bladder is normal. Stomach/Bowel: Stomach is within normal limits. No evidence of bowel wall thickening, distention, or inflammatory changes. Vascular/Lymphatic: Aortic atherosclerosis. No enlarged abdominal or pelvic lymph nodes. Reproductive: Nonspecific prostate gland calcifications. Nonenlarged prostate. Other: No abdominal wall hernia or abnormality. No abdominopelvic ascites. Musculoskeletal: Compression fracture of L1 vertebral body with stable appearance from the prior exams with approximately 50% height loss and 5 mm bony retropulsion to the spinal canal. Multilevel osteoarthritic changes of the lumbosacral spine. IMPRESSION: 1.  Interval removal of left nephrostomy tube. 4.5 x 2.4 cm intermediate density collection adjacent to the left lateral renal cortex, at the site of previously nephrostomy tube. This may represent a residual hematoma or abscess. 2. Bilateral nephrolithiasis. 3. Moderate to large right pleural effusion. Small left pleural effusion. 4. Enlarged heart. 5. Splenomegaly. 6. Stable compression fracture of L1 vertebral body with approximately 50% height loss and 5 mm bony retropulsion to the spinal canal. 7. Aortic atherosclerosis. Aortic Atherosclerosis (ICD10-I70.0). Electronically Signed   By: Ted Mcalpineobrinka  Dimitrova M.D.   On: 02/11/2021 14:41   CT HEAD WO CONTRAST  Result Date: 03/02/2021 CLINICAL DATA:  Altered mental status with hemorrhage suspected EXAM: CT HEAD WITHOUT CONTRAST  TECHNIQUE: Contiguous axial images were obtained from the base of the skull through the vertex without intravenous contrast. COMPARISON:  01/24/2021 FINDINGS: Brain: No evidence of acute infarction, hemorrhage, hydrocephalus, extra-axial collection or mass lesion/mass effect. Generalized atrophy. Mildly asymmetric CSF spaces attributed to decubitus positioning. Age congruent white matter appearance Vascular: No hyperdense vessel or unexpected calcification. Skull: Normal. Negative for fracture or focal lesion. Sinuses/Orbits: No acute finding. IMPRESSION: 1. No acute finding or change from May 2022. 2. Generalized atrophy. Electronically Signed   By: Marnee SpringJonathon  Watts M.D.   On: 03/02/2021 08:36   DG Chest Port 1 View  Result Date: 03/01/2021 CLINICAL DATA:  Sepsis. Purulence discharge around nephrostomy tube. EXAM: PORTABLE CHEST 1 VIEW COMPARISON:  01/24/21 FINDINGS: Stable cardiomediastinal contours. Left lung is clear. Partially loculated right pleural effusion with overlying atelectasis is again identified right upper lobe appears clear. IMPRESSION: Unchanged appearance of right lung base with partially loculated pleural effusion and overlying atelectasis. Electronically Signed   By: Signa Kellaylor  Stroud M.D.   On: 03/01/2021 17:04    Labs:  CBC: Recent Labs    02/12/21 0104 03/01/21 1352 03/02/21 0330 03/03/21 0756  WBC 4.4 6.1 4.8 5.5  HGB 8.2* 10.1* 8.9* 9.4*  HCT 25.8* 32.6* 27.3* 28.7*  PLT 238 232 187 216    COAGS: Recent Labs    01/25/21 1013 03/01/21 1352  INR 1.6* 1.3*  APTT  --  33    BMP: Recent Labs    02/12/21 0104 03/01/21 1352 03/02/21 0330 03/03/21 0756  NA 135 133* 135 136  K 5.0 5.1 4.2 3.9  CL 110 110 117* 118*  CO2 20* 12* 12* 13*  GLUCOSE 88 99 98 96  BUN 29* 58* 53* 37*  CALCIUM 8.4* 9.4 8.7* 9.0  CREATININE 1.94* 3.20* 2.75* 2.05*  GFRNONAA 34* 18* 22* 32*    LIVER FUNCTION TESTS: Recent Labs    01/24/21 2012 02/10/21 0325 02/11/21 1033  03/01/21 1352  BILITOT 0.6 0.6 0.6 0.7  AST 14* 12* 13* 17  ALT 13 9 10 13   ALKPHOS 63 77 79 86  PROT 6.3* 5.2* 5.2* 6.5  ALBUMIN 2.8* 2.5* 2.5* 3.4*    TUMOR MARKERS: No results for input(s): AFPTM, CEA, CA199, CHROMGRNA in the last 8760 hours.  Assessment and Plan: 85 y.o. male with right staghorn nephrolithiasis s/p right PCN placement with IR on 01/25/2021.  Patient was hospitalized from 01/25/2021 to 02/12/2021,was discharged in stable condition with the right PCN intact.  Unfortunately patient is rehospitalized on 03/01/2021 due to chronic UTI and worsening AKI with hypotension.  Patient's mental status has been altering, primary team suspect toxic-metabolic encephalopathy secondary to UTI and/or sepsis.  IR was requested for image guided right PCN exchange.  Case was discussed with the patient at the bedside, informed  the patient that this procedure typically do not require sedation, and IR can proceed with the procedure today if he agrees to proceed with no sedation. Patient states that he would not be able to tolerate the procedure without sedation, request the procedure to be done next Monday with sedation. Discussed with patient the risk of sedation, and the possibility of him being n.p.o. all day Monday, and possibility of IR not able to accommodate the procedure next Monday. Patient verbalized understanding, request IR to proceed with right PCN exchange next Monday with sedation. Discussed with ordering doctor, Dr. Barbaraann Faster, it is okay to proceed on next Monday.  The procedure is tentatively scheduled on Monday, 03/06/2021 pending IR schedule. N.p.o. at midnight Monday  Risks and benefits of right PCN exchange  was discussed with the patient including, but not limited to, infection, bleeding, or damage to adjacent structures.   All of the patient's questions were answered, patient is agreeable to proceed.  Consent signed and in chart.    Thank you for this interesting consult.   I greatly enjoyed meeting RAUNAK ANTUNA and look forward to participating in their care.  A copy of this report was sent to the requesting provider on this date.  Electronically Signed: Willette Brace, PA-C 03/03/2021, 3:17 PM   I spent a total of 40 Minutes   in face to face in clinical consultation, greater than 50% of which was counseling/coordinating care for right PCN exchange.

## 2021-03-03 NOTE — Progress Notes (Signed)
Physical Therapy Evaluation Patient Details Name: Alfred Mercado MRN: [id number] DOB: 1935-10-04 Today's Date: 03/03/2021   History of Present Illness  85 year old male with a past medical history of HFpEF, persistent atrial fibrillation, COPD, CKD IIIb, and multiple recent admissions for complicated UTI with bilateral pyelonephritis requiring nephrostomy tubes. Admitted through ED 03/01/21 for dizziness and AMS related to sepsis started on IV cefazolin for UTI and nephrostomy site cellulitis.  Clinical Impression  Pt is well known to therapist from prior admissions. Pt lives in a motel where he reports since his last admission he has been able to walk with RW, 4 feet someday and 15 feet other days. Pt reports that he does his bathing and dressing and relies on neighbors to bring him food and perform housekeeping. Pt is currently limited in safe mobility by decreased cognition especially safety awareness, in presence of decreased strength, balance and endurance. Pt is total A for bed mobility. PT recommends SNF level rehab. PT will continue to follow acutely.    Follow Up Recommendations SNF;Supervision for mobility/OOB    Equipment Recommendations  None recommended by PT       Precautions / Restrictions Precautions Precautions: Fall Restrictions Weight Bearing Restrictions: No Other Position/Activity Restrictions: pt had a TLSO prescribed for comfort after his L1 burst fx, but does not use      Mobility  Bed Mobility Overal bed mobility: Needs Assistance Bed Mobility: Supine to Sit;Sit to Supine     Supine to sit: Total assist;+2 for physical assistance Sit to supine: Total assist;+2 for physical assistance   General bed mobility comments: pt provided with total A to sit EoB for dressing change at nephrostomy site, requires total A to return to bed, no initiation at all          Balance Overall balance assessment: Needs assistance Sitting-balance support: Feet  unsupported;Bilateral upper extremity supported;No upper extremity supported;Single extremity supported Sitting balance-[name]: Poor Sitting balance - Comments: outside assist required                                     Pertinent Vitals/Pain Pain Assessment: Faces Faces Pain Scale: Hurts whole lot Pain Location: nephrostomy site with dressing change Pain Descriptors / Indicators: Grimacing;Guarding;Moaning;Sharp Pain Intervention(s): Limited activity within patient's tolerance;Monitored during session;Repositioned;RN gave pain meds during session    Home Living Family/patient expects to be discharged to:: Private residence Living Arrangements: Alone Available Help at Discharge: Available PRN/intermittently;Neighbor Type of Home: Other(Comment) (motel) Home Access: Level entry     Home Layout: One level Home Equipment: Cane - single point;Wheelchair - manual Additional Comments: Motrized chair    Prior Function Level of Independence: Needs assistance   Gait / Transfers Assistance Needed: walks with a cane 4 feet somedays and 15 feet other days  ADL's / Homemaking Assistance Needed: independent with ADLs/selfcarem, neighbors help with house keepings and bringing food  Comments: attached to his community and wants to be able to smoke     Hand Dominance   Dominant Hand: Right    Extremity/Trunk Assessment   Upper Extremity Assessment Upper Extremity Assessment: Defer to OT evaluation    Lower Extremity Assessment Lower Extremity Assessment: Generalized weakness    Cervical / Trunk Assessment Cervical / Trunk Exceptions: hx of L1 burst fracture  Communication   Communication: HOH  Cognition Arousal/Alertness: Awake/alert Behavior During Therapy: Impulsive;Agitated Overall Cognitive Status: Impaired/Different from baseline  Memory: Decreased short-term memory   Safety/Judgement: Decreased awareness of  safety;Decreased awareness of deficits   Problem Solving: Slow processing;Difficulty sequencing;Requires verbal cues;Requires tactile cues;Decreased initiation General Comments: Agitated and wanting to speak with MD at beginning of session. MD in room and pt reports he needs to leave and go to the bank and would it be alright if he left and came back. MD states "I think you would die before you get back." Mutually agreed that LCSW would be contact to assist with his problem. LCSW sent secure chat.      General Comments General comments (skin integrity, edema, etc.): HR max noted 140bpm with dressing change        Assessment/Plan    PT Assessment Patient needs continued PT services  PT Problem List Decreased strength;Decreased range of motion;Decreased activity tolerance;Decreased balance;Decreased mobility;Decreased coordination;Decreased cognition;Decreased safety awareness;Pain;Decreased skin integrity       PT Treatment Interventions DME instruction;Gait training;Functional mobility training;Therapeutic activities;Therapeutic exercise;Balance training;Cognitive remediation;Patient/family education    PT Goals (Current goals can be found in the Care Plan section)  Acute Rehab PT Goals Patient Stated Goal: get banking straightened out PT Goal Formulation: With patient Time For Goal Achievement: 03/17/21 Potential to Achieve Goals: Fair    Frequency Min 3X/week   Barriers to discharge Decreased caregiver support         AM-PAC PT "6 Clicks" Mobility  Outcome Measure Help needed turning from your back to your side while in a flat bed without using bedrails?: Total Help needed moving from lying on your back to sitting on the side of a flat bed without using bedrails?: Total Help needed moving to and from a bed to a chair (including a wheelchair)?: Total Help needed standing up from a chair using your arms (e.g., wheelchair or bedside chair)?: Total Help needed to walk in  hospital room?: Total Help needed climbing 3-5 steps with a railing? : Total 6 Click Score: 6    End of Session Equipment Utilized During Treatment: Gait belt Activity Tolerance: Patient limited by pain Patient left: in bed;with call bell/phone within reach;with bed alarm set;with nursing/sitter in room Nurse Communication: Mobility status PT Visit Diagnosis: Unsteadiness on feet (R26.81);Muscle weakness (generalized) (M62.81);Difficulty in walking, not elsewhere classified (R26.2);History of falling (Z91.81);Repeated falls (R29.6);Other abnormalities of gait and mobility (R26.89);Pain Pain - Right/Left: Right Pain - part of body:  (flank)    Time: 9242-6834 PT Time Calculation (min) (ACUTE ONLY): 31 min   Charges:   PT Evaluation $PT Eval Moderate Complexity: 1 Mod          Ramsay Bognar B. Beverely Risen PT, DPT Acute Rehabilitation Services Pager [phone] Office [phone]   [name] 03/03/2021, 11:44 AM

## 2021-03-03 NOTE — Progress Notes (Signed)
Pharmacy Antibiotic Note  Alfred Mercado is a 85 y.o. male admitted on 03/01/2021 with  IAI with purulent drainage from nephrostomy tube .  Pharmacy has been consulted for Vanco dosing.   ID: IAI/UTI - hx of enterobacter cloacae from L peri-renal abscess (01/25/21) - R ancef. - left nephrostomy tube was removed prior to discharge on 02/02/21. Placed on 2 wks Bactrim. --having hematuria, urology following  Vanco 7/8>> Cefepime 7/7 >> Cefazolin x2 doses on 7/6  7/6: Ucx: 40,000 Staph epi  Plan: Cefepime 2g IV q24h - Adjust frequency for renal improvement. - Add Vancomycin 1500mg  (20mg /kg load) then 500mg  IV q24h per nomogram dosing. When Scr normalizes, will convert to AUC dosing.    Weight: 72.4 kg (159 lb 9.8 oz)  Temp (24hrs), Avg:98.2 F (36.8 C), Min:97.5 F (36.4 C), Max:98.5 F (36.9 C)  Recent Labs  Lab 03/01/21 1352 03/01/21 1802 03/02/21 0330 03/03/21 0756  WBC 6.1  --  4.8 5.5  CREATININE 3.20*  --  2.75* 2.05*  LATICACIDVEN 1.5 1.1  --   --     Estimated Creatinine Clearance: 28 mL/min (A) (by C-G formula based on SCr of 2.05 mg/dL (H)).    No Known Allergies  [name] S. 05/02/21, PharmD, BCPS Clinical Staff Pharmacist [email or web address]   05/03/21 03/03/2021 2:13 PM
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
## 2021-03-03 NOTE — Progress Notes (Signed)
    Subjective:   Overnight Events: Palliative care attempted to see patient, given his lethargy they will need to be reconsulted when patient can participate in conversation.  Patient controls most of conversation. He does not remember most of yesterday and ask for explanation. Endorses understanding of simple explanation. He asked for a coke, pain medications, and agrees with further treatment.   Objective:  Vital signs in last 24 hours: Vitals:   03/03/21 0025 03/03/21 0405 03/03/21 0455 03/03/21 0727  BP: (!) 104/57 114/60  (!) 96/53  Pulse:    (!) 101  Resp: 19 19  20   Temp: 98.5 F (36.9 C) 98.2 F (36.8 C)  98.4 F (36.9 C)  TempSrc: Oral Oral  Axillary  SpO2: 95% 95%    Weight:   72.4 kg    Supplemental O2: Room Air SpO2: 95 %  Physical Exam:   General: Frail and ill-appearing Cardiovascular: Irregular regular rhythm, tachycardic, no murmurs Pulmonary : Normal work of breathing Abdominal: soft, nontender,  bowel sounds present Back:   Right nephrostomy tube in place, erythematous, purulent drainage around nephrostomy tube Skin: Warm, dry  Psychiatric/Behavioral:  Poor insight to the severity of illness, cooperative    Assessment/Plan:   Principal Problem:   Sepsis (HCC) Active Problems:   Chronic heart failure with preserved ejection fraction (HCC)   Acute-on-chronic kidney injury (HCC)   Staghorn renal calculus   Cellulitis   Hyponatremia   Patient Summary: Hospital day 1 for Alfred Mercado a 85 y.o. person living with HFpEF, persistent atrial fibrillation, COPD, CKD 3B , recent emphysematous pyelitis and left perinephric abscess s/p nephrostomy tube ( removed before discharge 6/19) , and right staghorn calculus with right percutaneous nephrostomy tube admitted with sepsis.   #UTI,  Encephalopathy is improved today and patient appears to be approximately back at his baseline.  Removed dressing around nephrostomy tube as seen above under physical  exam and patient has purulent drainage.   -Blood cultures negative to date x2 days, urine culture showing this afternoon 40,000 Staph epidermidis, will broaden coverage with vancomycin and continue cefepime.  Susceptibilities are pending. -Spoke to patient's urologist via telephone, and patient is likely not a good surgical candidate but will need to undergo further evaluation in outpatient setting.  Consulted on-call urologist, given complexity of case he would defer to patient's primary urologist in his practice. -Consulted IR who will attempt to exchange nephrostomy tube today, but given patient has needed sedation in the past this may need to be put off until Monday.  #Atrial fibrillation Persistent atrial fibrillation, holding Eliquis in setting of procedure.  Patient has mostly been rate controlled without medication however he had episode of tachycardia this morning associated with bandage changes.  If patient has further A. fib with RVR will start amiodarone, will avoid calcium channel blocker or beta-blocker in setting of low blood pressure.   Diet:  soft diet IVF: None,None VTE: Hold Eliquis for procedure Code: Full PT/OT recs: SNF   Dispo: Anticipated discharge to Skilled nursing facility in 5 days pending IR tube replacement on Monday and further medical improvement.   Friday, MD PGY3 Internal Medicine Pager: [phone] Please contact the on call pager after 5 pm and on weekends at [phone].

## 2021-03-04 LAB — CBC WITH DIFFERENTIAL/PLATELET
Abs Immature Granulocytes: 0.03 10*3/uL (ref 0.00–0.07)
Basophils Absolute: 0.1 10*3/uL (ref 0.0–0.1)
Basophils Relative: 1 %
Eosinophils Absolute: 0.2 10*3/uL (ref 0.0–0.5)
Eosinophils Relative: 4 %
HCT: 25.3 % — ABNORMAL LOW (ref 39.0–52.0)
Hemoglobin: 8.3 g/dL — ABNORMAL LOW (ref 13.0–17.0)
Immature Granulocytes: 1 %
Lymphocytes Relative: 18 %
Lymphs Abs: 0.9 10*3/uL (ref 0.7–4.0)
MCH: 27.5 pg (ref 26.0–34.0)
MCHC: 32.8 g/dL (ref 30.0–36.0)
MCV: 83.8 fL (ref 80.0–100.0)
Monocytes Absolute: 0.6 10*3/uL (ref 0.1–1.0)
Monocytes Relative: 12 %
Neutro Abs: 3 10*3/uL (ref 1.7–7.7)
Neutrophils Relative %: 64 %
Platelets: 203 10*3/uL (ref 150–400)
RBC: 3.02 MIL/uL — ABNORMAL LOW (ref 4.22–5.81)
RDW: 21.2 % — ABNORMAL HIGH (ref 11.5–15.5)
WBC: 4.6 10*3/uL (ref 4.0–10.5)
nRBC: 0 % (ref 0.0–0.2)

## 2021-03-04 LAB — BASIC METABOLIC PANEL
Anion gap: 6 (ref 5–15)
BUN: 32 mg/dL — ABNORMAL HIGH (ref 8–23)
CO2: 11 mmol/L — ABNORMAL LOW (ref 22–32)
Calcium: 8.9 mg/dL (ref 8.9–10.3)
Chloride: 117 mmol/L — ABNORMAL HIGH (ref 98–111)
Creatinine, Ser: 1.9 mg/dL — ABNORMAL HIGH (ref 0.61–1.24)
GFR, Estimated: 35 mL/min — ABNORMAL LOW (ref >60)
Glucose, Bld: 99 mg/dL (ref 70–99)
Potassium: 3.4 mmol/L — ABNORMAL LOW (ref 3.5–5.1)
Sodium: 134 mmol/L — ABNORMAL LOW (ref 135–145)

## 2021-03-04 LAB — URINE CULTURE: Culture: 40000 — AB

## 2021-03-04 MED ORDER — LACTATED RINGERS IV SOLN: INTRAVENOUS | Status: AC

## 2021-03-04 MED ORDER — POTASSIUM CHLORIDE CRYS ER 20 MEQ PO TBCR
30.0000 meq | EXTENDED_RELEASE_TABLET | Freq: Two times a day (BID) | ORAL | Status: AC
  Administered 2021-03-04 – 2021-03-05 (×2): 30 meq via ORAL
  Filled 2021-03-04 (×2): qty 1

## 2021-03-04 NOTE — Progress Notes (Signed)
    Subjective:   Overnight Events: No acute events  Patient sleeping on approach. No concerns on exam this morning. Continues to be very tender around R. Nephrostomy tube.   Objective:  Vital signs in last 24 hours: Vitals:   03/03/21 1601 03/03/21 2030 03/03/21 2355 03/04/21 0405  BP: (!) 105/57 98/62 93/68  (!) 91/48  Pulse: 100     Resp: 20 18 18 15   Temp: (!) 97.5 F (36.4 C) 97.8 F (36.6 C) 97.7 F (36.5 C) 97.7 F (36.5 C)  TempSrc: Oral Oral Oral Oral  SpO2:  95% 95% 95%  Weight:    72.8 kg   Supplemental O2: Room Air SpO2: 95 %  Physical Exam:   General: Frail and ill-appearing Cardiovascular: Irregular regular rhythm, normal rate no murmurs Pulmonary : Normal work of breathing, decreased breath sounds right base Abdominal: soft, nontender,  bowel sounds present Back: clean dressing intact over nephrostomy tube  Antibiotics Cefazolin 7/6-7/7 Cefepime 7/7 -> Vancomycin 7/8 ->  Culture data Blood cultures NGTD x2 days Urine cultures: Staph epidermis, 40 K colonies, susceptibilities pending   Assessment/Plan:   Principal Problem:   Sepsis (HCC) Active Problems:   Chronic heart failure with preserved ejection fraction (HCC)   Acute-on-chronic kidney injury (HCC)   Complicated UTI (urinary tract infection)   Staghorn renal calculus   Cellulitis   Hyponatremia   Patient Summary: Hospital day 2 for Alfred Mercado a 85 y.o. person living with HFpEF, persistent atrial fibrillation, COPD, CKD 3B , recent emphysematous pyelitis and left perinephric abscess s/p nephrostomy tube ( removed before discharge 6/19) , and right staghorn calculus with right percutaneous nephrostomy tube admitted with sepsis.   #UTI #Purulent Cellulitis    -Afebrile overnight, WBC 4.6 -Blood cultures negative x2 days.  Growing staph epidermis in urine, 40 K colonies.  Susceptibilities pending. -Continue vancomycin and cefepime - Blood pressure soft, will give LR 100cc per  hour for 5 hours, and continue to encourage PO intake.  -Tentative plan for IR to take patient for nephrostomy tube exchange on Monday with sedation, NPO order placed.  #Atrial fibrillation Persistent atrial fibrillation, holding Eliquis in setting of procedure.  Telemetry review and had few episodes in last 24 hours of increase heart rate, but mostly heart rate is <100.   #Hypokalemia - potassium 3.4, replete  #Normocytic Anemia Hx of IDA, iron 33, tibc 277, ferritin 63 on 6/1. Received iron transfusion on last admission when these labs were collected. Recent baseline 8-9, Hgb 8.3 today. No further workup at this time, continue to monitor.    Diet:  soft diet IVF: None,None VTE: Hold Eliquis for procedure Code: Full PT/OT recs: SNF   Dispo: Anticipated discharge to Skilled nursing facility in 5 days pending IR tube replacement on Monday and further medical improvement.   Sunday, MD PGY3 Internal Medicine Pager: [phone] Please contact the on call pager after 5 pm and on weekends at [phone].

## 2021-03-05 DIAGNOSIS — R6520 Severe sepsis without septic shock: Secondary | ICD-10-CM | POA: Diagnosis not present

## 2021-03-05 DIAGNOSIS — A419 Sepsis, unspecified organism: Secondary | ICD-10-CM | POA: Diagnosis not present

## 2021-03-05 DIAGNOSIS — N179 Acute kidney failure, unspecified: Secondary | ICD-10-CM | POA: Diagnosis not present

## 2021-03-05 LAB — CBC WITH DIFFERENTIAL/PLATELET
Abs Immature Granulocytes: 0.03 K/uL (ref 0.00–0.07)
Basophils Absolute: 0.1 K/uL (ref 0.0–0.1)
Basophils Relative: 1 %
Eosinophils Absolute: 0.2 K/uL (ref 0.0–0.5)
Eosinophils Relative: 6 %
HCT: 25.4 % — ABNORMAL LOW (ref 39.0–52.0)
Hemoglobin: 8.5 g/dL — ABNORMAL LOW (ref 13.0–17.0)
Immature Granulocytes: 1 %
Lymphocytes Relative: 19 %
Lymphs Abs: 0.8 K/uL (ref 0.7–4.0)
MCH: 27.7 pg (ref 26.0–34.0)
MCHC: 33.5 g/dL (ref 30.0–36.0)
MCV: 82.7 fL (ref 80.0–100.0)
Monocytes Absolute: 0.4 K/uL (ref 0.1–1.0)
Monocytes Relative: 9 %
Neutro Abs: 2.8 K/uL (ref 1.7–7.7)
Neutrophils Relative %: 64 %
Platelets: 212 K/uL (ref 150–400)
RBC: 3.07 MIL/uL — ABNORMAL LOW (ref 4.22–5.81)
RDW: 21.5 % — ABNORMAL HIGH (ref 11.5–15.5)
WBC: 4.3 K/uL (ref 4.0–10.5)
nRBC: 0 % (ref 0.0–0.2)

## 2021-03-05 LAB — BASIC METABOLIC PANEL WITH GFR
Anion gap: 7 (ref 5–15)
BUN: 27 mg/dL — ABNORMAL HIGH (ref 8–23)
CO2: 12 mmol/L — ABNORMAL LOW (ref 22–32)
Calcium: 9 mg/dL (ref 8.9–10.3)
Chloride: 117 mmol/L — ABNORMAL HIGH (ref 98–111)
Creatinine, Ser: 1.67 mg/dL — ABNORMAL HIGH (ref 0.61–1.24)
GFR, Estimated: 40 mL/min — ABNORMAL LOW
Glucose, Bld: 102 mg/dL — ABNORMAL HIGH (ref 70–99)
Potassium: 3.5 mmol/L (ref 3.5–5.1)
Sodium: 136 mmol/L (ref 135–145)

## 2021-03-05 MED ORDER — VANCOMYCIN HCL 750 MG/150ML IV SOLN
750.0000 mg | INTRAVENOUS | Status: DC
  Filled 2021-03-05: qty 150

## 2021-03-05 MED ORDER — SODIUM CHLORIDE 0.9 % IV SOLN
2.0000 g | Freq: Two times a day (BID) | INTRAVENOUS | Status: DC
  Administered 2021-03-05 – 2021-03-07 (×4): 2 g via INTRAVENOUS
  Filled 2021-03-05 (×4): qty 2

## 2021-03-05 MED ORDER — MELATONIN 3 MG PO TABS
3.0000 mg | ORAL_TABLET | Freq: Every day | ORAL | Status: AC
  Administered 2021-03-05: 3 mg via ORAL
  Filled 2021-03-05: qty 1

## 2021-03-05 NOTE — Progress Notes (Signed)
    Subjective:   Overnight Events: No acute overnight events  Patient is sitting up in chair today and reports he is feeling some better. He is aware of tentative plan for catheter exchange tomorrow.    Objective:  Vital signs in last 24 hours: Vitals:   03/04/21 1611 03/04/21 1958 03/05/21 0015 03/05/21 0359  BP: 111/71 (!) 101/53 105/68 (!) 106/50  Pulse: 98 92 89 70  Resp: 20 20 14  (!) 23  Temp: 97.7 F (36.5 C) 97.6 F (36.4 C) 98.5 F (36.9 C) (!) 97.4 F (36.3 C)  TempSrc: Oral Oral Oral Axillary  SpO2: 100% 100% 90% 99%  Weight:       Supplemental O2: Room Air SpO2: 99 %  Physical Exam:   General: Elderly male with a self described ornery personality, sitting up in chair Cardiovascular: Irregular regular rhythm, normal rate no murmurs Pulmonary : Normal work of breathing, Abdominal: soft, nontender,  bowel sounds present Back: clean dressing intact over nephrostomy tube  Antibiotics Cefazolin 7/6-7/7 Cefepime 7/7 -> Vancomycin 7/8 ->  Culture data Blood cultures NGTD x4 days Urine cultures: Staph epidermis, 40 K colonies, susceptibilities below  CIPROFLOXACIN Sensitive <=0.5 SENSITIVE MIC Final   GENTAMICIN Sensitive <=0.5 SENSITIVE MIC Final   NITROFURANTOIN Sensitive <=16 SENSITIVE MIC Final   OXACILLIN Resistant >=4 RESISTANT MIC Final   TETRACYCLINE Sensitive 2 SENSITIVE MIC Final   VANCOMYCIN Sensitive 1 SENSITIVE MIC Final   TRIMETH/SULFA Resistant 160 RESISTANT MIC Final   CLINDAMYCIN Resistant >=8 RESISTANT MIC Final   RIFAMPIN Sensitive <=0.5 SENSITIVE MIC Final   Inducible Clindamycin Sensitive       Assessment/Plan:   Principal Problem:   Sepsis (HCC) Active Problems:   Chronic heart failure with preserved ejection fraction (HCC)   Acute-on-chronic kidney injury (HCC)   Complicated UTI (urinary tract infection)   Staghorn renal calculus   Cellulitis   Hyponatremia   Patient Summary: Hospital day 3 for Alfred Mercado a 85 y.o.  person living with HFpEF, persistent atrial fibrillation, COPD, CKD 3B , recent emphysematous pyelitis and left perinephric abscess s/p nephrostomy tube ( removed before discharge 6/19) , and right staghorn calculus with right percutaneous nephrostomy tube admitted with sepsis.   #UTI #Purulent Cellulitis    - Patient admitted with altered mental status from sepsis.. Source of infection identified as purulent cellulitis overlying right nephrostomy tube and urinary tract infection. He has improved with IV antibiotics.  - Blood cultures negative x 4 days.  Growing staph epidermis in urine, 40 K colonies. Previously growing Enterobacter in July from left peri renal abscess, sensitive to Cefepime.  - Continue Vancomycin and Cefepime.  - Tentative plan for IR to take patient for nephrostomy tube exchange on Monday with sedation, NPO order placed.  #Atrial fibrillation Persistent atrial fibrillation, holding Eliquis in setting of procedure.  - Continue telemetry  #Normocytic Anemia Hx of IDA, iron 33, tibc 277, ferritin 63 on 6/1. Received iron transfusion on last admission when these labs were collected.  - Hgb Stable today at 8.5    Diet:  soft diet , NPO at midnight IVF: None,None VTE: Hold Eliquis for procedure Code: Full PT/OT recs: SNF   Dispo: Anticipated discharge to Skilled nursing facility in 5 days pending IR tube replacement on Monday and further medical improvement.   Friday, MD PGY3 Internal Medicine Pager: [phone] Please contact the on call pager after 5 pm and on weekends at [phone].

## 2021-03-05 NOTE — Progress Notes (Signed)
Pharmacy Antibiotic Note  AMEL GIANINO is a 85 y.o. male admitted on 03/01/2021 with  IAI with purulent drainage from nephrostomy tube .  Recent prior hospital admission 01/25/2021 to 02/12/2021 due to hypotension, right staghorn kidney stone with hematuria, acute on chronic kidney disease, and pleural effusion.  The left perinephric drain was removed during the admission, patient was discharged with right PCN with follow-up with urology. Patient unfortunately represented to [facility] ED on 03/01/2021 due to pain around the right PCN and intermittent fever.  Labs revealed worsening AKI and chronic UTI, nephrology was consulted and patient was admitted for further evaluation and management.    Pharmacy consulted for Cefepime and Vancomycin dosing for purulent drainage, right nephrostomy tube. AKI on CKD: XCr 3.7 on admit - now down to 2.05>1.90>1.67 today.   Will adjust Cefepime dose.  Plan for  nephrostomy tube exchange per IR on 7/11 Mon if MS improves and pt cooperative.   Vanco 7/8>> Cefepime 7/7 >> Cefazolin x2 doses on 7/6  7/6: Ucx: 40,000 Staph epidermdis:  pan sensitive except Resistant to clindamycin, oxacillin, and septra.   Plan: Increase Cefepime to  2g IV q12h Increase Vancomycin  to 750 mg IV q24h per nomogram dosing. When Scr normalizes, will convert to AUC dosing.    Weight: 75.1 kg (165 lb 9.1 oz)  Temp (24hrs), Avg:97.8 F (36.6 C), Min:97.4 F (36.3 C), Max:98.5 F (36.9 C)  Recent Labs  Lab 03/01/21 1352 03/01/21 1802 03/02/21 0330 03/03/21 0756 03/04/21 0145 03/05/21 0654  WBC 6.1  --  4.8 5.5 4.6 4.3  CREATININE 3.20*  --  2.75* 2.05* 1.90* 1.67*  LATICACIDVEN 1.5 1.1  --   --   --   --      Estimated Creatinine Clearance: 35.6 mL/min (A) (by C-G formula based on SCr of 1.67 mg/dL (H)).    No Known Allergies  Crystal S. Merilynn Finland, PharmD, BCPS Clinical Staff Pharmacist [email or web address]   Arman Filter 03/05/2021 2:33 PM

## 2021-03-06 ENCOUNTER — Inpatient Hospital Stay (HOSPITAL_COMMUNITY): Payer: Medicare Other

## 2021-03-06 HISTORY — PX: IR NEPHROSTOMY EXCHANGE RIGHT: IMG6070

## 2021-03-06 LAB — BASIC METABOLIC PANEL
Anion gap: 6 (ref 5–15)
BUN: 27 mg/dL — ABNORMAL HIGH (ref 8–23)
CO2: 14 mmol/L — ABNORMAL LOW (ref 22–32)
Calcium: 9.2 mg/dL (ref 8.9–10.3)
Chloride: 115 mmol/L — ABNORMAL HIGH (ref 98–111)
Creatinine, Ser: 1.81 mg/dL — ABNORMAL HIGH (ref 0.61–1.24)
GFR, Estimated: 37 mL/min — ABNORMAL LOW (ref >60)
Glucose, Bld: 96 mg/dL (ref 70–99)
Potassium: 3.7 mmol/L (ref 3.5–5.1)
Sodium: 135 mmol/L (ref 135–145)

## 2021-03-06 LAB — CBC WITH DIFFERENTIAL/PLATELET
Abs Immature Granulocytes: 0.04 10*3/uL (ref 0.00–0.07)
Basophils Absolute: 0.1 10*3/uL (ref 0.0–0.1)
Basophils Relative: 1 %
Eosinophils Absolute: 0.3 10*3/uL (ref 0.0–0.5)
Eosinophils Relative: 5 %
HCT: 25.7 % — ABNORMAL LOW (ref 39.0–52.0)
Hemoglobin: 8.7 g/dL — ABNORMAL LOW (ref 13.0–17.0)
Immature Granulocytes: 1 %
Lymphocytes Relative: 19 %
Lymphs Abs: 0.9 10*3/uL (ref 0.7–4.0)
MCH: 27.9 pg (ref 26.0–34.0)
MCHC: 33.9 g/dL (ref 30.0–36.0)
MCV: 82.4 fL (ref 80.0–100.0)
Monocytes Absolute: 0.5 10*3/uL (ref 0.1–1.0)
Monocytes Relative: 9 %
Neutro Abs: 3.1 10*3/uL (ref 1.7–7.7)
Neutrophils Relative %: 65 %
Platelets: 227 10*3/uL (ref 150–400)
RBC: 3.12 MIL/uL — ABNORMAL LOW (ref 4.22–5.81)
RDW: 21.3 % — ABNORMAL HIGH (ref 11.5–15.5)
WBC: 4.6 10*3/uL (ref 4.0–10.5)
nRBC: 0 % (ref 0.0–0.2)

## 2021-03-06 LAB — CULTURE, BLOOD (SINGLE): Culture: NO GROWTH

## 2021-03-06 MED ORDER — MIDAZOLAM HCL 2 MG/2ML IJ SOLN
INTRAMUSCULAR | Status: AC
  Filled 2021-03-06: qty 2

## 2021-03-06 MED ORDER — LINEZOLID 600 MG PO TABS
600.0000 mg | ORAL_TABLET | Freq: Two times a day (BID) | ORAL | Status: DC
  Administered 2021-03-06 – 2021-03-07 (×3): 600 mg via ORAL
  Filled 2021-03-06 (×4): qty 1

## 2021-03-06 MED ORDER — FENTANYL CITRATE (PF) 100 MCG/2ML IJ SOLN
INTRAMUSCULAR | Status: AC
  Filled 2021-03-06: qty 2

## 2021-03-06 MED ORDER — FENTANYL CITRATE (PF) 100 MCG/2ML IJ SOLN
INTRAMUSCULAR | Status: AC | PRN
  Administered 2021-03-06: 25 ug via INTRAVENOUS

## 2021-03-06 MED ORDER — MIDAZOLAM HCL 2 MG/2ML IJ SOLN
INTRAMUSCULAR | Status: AC | PRN
  Administered 2021-03-06: 1 mg via INTRAVENOUS

## 2021-03-06 MED ORDER — IOHEXOL 300 MG/ML  SOLN
50.0000 mL | Freq: Once | INTRAMUSCULAR | Status: AC | PRN
  Administered 2021-03-06: 10 mL

## 2021-03-06 MED ORDER — APIXABAN 2.5 MG PO TABS
2.5000 mg | ORAL_TABLET | Freq: Two times a day (BID) | ORAL | Status: DC
  Administered 2021-03-06 – 2021-03-17 (×23): 2.5 mg via ORAL
  Filled 2021-03-06 (×23): qty 1

## 2021-03-06 MED ORDER — LIDOCAINE HCL 1 % IJ SOLN
INTRAMUSCULAR | Status: AC
  Administered 2021-03-06: 4 mL via SUBCUTANEOUS
  Filled 2021-03-06: qty 20

## 2021-03-06 NOTE — Progress Notes (Addendum)
    Subjective:   Overnight Events: No acute overnight events  Patient resting in bed after his nephrostomy tube exchange.  He feels fatigued and said he knows he is getting old.  We further discussed possibility he will need nephrostomy tube exchanges every 3 months and may not be a surgical candidate.  Ultimately he will follow-up with his urologist in clinic and have further discussions.  He says he does not think he would do well with surgery either.  We discussed having palliative care come by now that he is feeling better .  He says he is open to having further discussions and help with making plans for the future.  Objective:  Vital signs in last 24 hours: Vitals:   03/06/21 1200 03/06/21 1230 03/06/21 1300 03/06/21 1551  BP: 107/73 120/75 112/63 102/72  Pulse: 90 93 92 94  Resp: 18 19 18 20   Temp:    97.7 F (36.5 C)  TempSrc:    Oral  SpO2: 98% 97% 99% 100%  Weight:       Supplemental O2: Room Air SpO2: 100 % O2 Flow Rate (L/min): 2 L/min  Physical Exam:   General: Elderly male with a self described ornery personality, lying on his side bed Cardiovascular: Irregular regular rhythm, normal rate no murmurs Pulmonary : Normal work of breathing, Abdominal: soft, nontender,  bowel sounds present Back: clean dressing, non tender to palpation over cellulitis.  Antibiotics Cefazolin 7/6-7/7 Cefepime 7/7 -> Vancomycin 7/8 ->  Culture data Blood cultures NGTD x5 days Urine cultures: Staph epidermis, 40 K colonies, susceptibilities below  R.Nephrostomy tube exchange date : 03/06/2021  Assessment/Plan:   Principal Problem:   Sepsis (HCC) Active Problems:   Chronic heart failure with preserved ejection fraction (HCC)   Acute-on-chronic kidney injury (HCC)   Complicated UTI (urinary tract infection)   Staghorn renal calculus   Cellulitis   Hyponatremia   Patient Summary: Hospital day  5 for Alfred Mercado a 85 y.o. person living with HFpEF, persistent atrial  fibrillation, COPD, CKD 3B , recent emphysematous pyelitis and left perinephric abscess s/p nephrostomy tube ( removed before discharge 6/19) , and right staghorn calculus with right percutaneous nephrostomy tube admitted with sepsis.   #Staff epidermidis UTI #Purulent Cellulitis    - Patient admitted with altered mental status from sepsis.. Source of infection identified as purulent cellulitis overlying right nephrostomy tube and urinary tract infection. He has improved with IV antibiotics.  - Continue Vancomycin and Cefepime for UTI and purulent cellulitis -R.percutaneous nephrostomy tube was exchanged today, greatly appreciate IR.  -Palliative care consult to assist with goals of care.  -Social work is working on 7/19 placement, patient can be transitioned to p.o. antibiotics and discharge when placed.  #Atrial fibrillation -Restart Eliquis 2.5 twice daily  #Normocytic Anemia Hx of IDA, iron 33, tibc 277, ferritin 63 on 6/1. Received iron transfusion on last admission when these labs were collected.  - Hgb Stable today at 8.7   Diet:  soft diet , NPO at midnight IVF: None,None VTE: Hold Eliquis for procedure Code: Full PT/OT recs: SNF   Dispo: Anticipated discharge to Skilled nursing facility in 5 days pending IR tube replacement on Monday and further medical improvement.   Saturday, MD PGY3 Internal Medicine Pager: [phone] Please contact the on call pager after 5 pm and on weekends at [phone].

## 2021-03-06 NOTE — Progress Notes (Signed)
Pharmacy Antibiotic Note  Alfred Mercado is a 85 y.o. male admitted on 03/01/2021 with  IAI with purulent drainage from nephrostomy tube .  Recent prior hospital admission 01/25/2021 to 02/12/2021 due to hypotension, right staghorn kidney stone with hematuria, acute on chronic kidney disease, and pleural effusion.  The left perinephric drain was removed during the admission, patient was discharged with right PCN with follow-up with urology. Patient unfortunately represented to [facility] ED on 03/01/2021 due to pain around the right PCN and intermittent fever.  Labs revealed worsening AKI and chronic UTI, nephrology was consulted and patient was admitted for further evaluation and management.    Pharmacy consulted for Cefepime and Vancomycin dosing for purulent drainage, right nephrostomy tube. AKI on CKD: XCr 3.7 on admit - now down to 2.05>1.90>1.67 today.   Will adjust Cefepime dose.  Plan for  nephrostomy tube exchange per IR on 7/11 Mon if MS improves and pt cooperative.  7/6: Ucx: 40,000 Staph epi- Pan sens except R to oxacillin, clinda, septra.  7/6 Covid: neg 7/6 blood>>negF  Vanco 7/8>> Cefepime 7/7 >> Cefazolin x2 doses on 7/6  D/w team about using linezolid vs vanc due to patients CKD issue. Linezolid would be appropriate for the MSRE in the urine culture.  Plan: Cefepime to  2g IV q12h Vanc>>linezolid 600mg  BID  Weight: 75.1 kg (165 lb 9.1 oz)  Temp (24hrs), Avg:97.6 F (36.4 C), Min:97.2 F (36.2 C), Max:97.8 F (36.6 C)  Recent Labs  Lab 03/01/21 1352 03/01/21 1802 03/02/21 0330 03/03/21 0756 03/04/21 0145 03/05/21 0654 03/06/21 0035  WBC 6.1  --  4.8 5.5 4.6 4.3 4.6  CREATININE 3.20*  --  2.75* 2.05* 1.90* 1.67* 1.81*  LATICACIDVEN 1.5 1.1  --   --   --   --   --      Estimated Creatinine Clearance: 32.8 mL/min (A) (by C-G formula based on SCr of 1.81 mg/dL (H)).    No Known Allergies  05/07/21, PharmD, BCIDP, AAHIVP, CPP Infectious Disease Pharmacist 03/06/2021  10:27 AM

## 2021-03-06 NOTE — Progress Notes (Signed)
   03/06/21 0558  Clinical Encounter Type  Visited With Patient  Visit Type Initial;Pre-op  Referral From Nurse  Consult/Referral To Chaplain  Spiritual Encounters  Spiritual Needs Prayer  Stress Factors  Patient Stress Factors Health changes   Chaplain followed-up with earlier page for prayer this morning before [name] procedure, per RN's request. Pt was sleepy, having just woken up. Pt shared he feels he's been blessed most of his life but he knows he's 19 now. Chaplain prayed with Pt for strength and peace. Pt was appreciative of the visit. Chaplain remains available.   This note was prepared by Chaplain Resident, Tacy Learn, MDiv. Chaplain remains available as needed through the on-call pager: [phone].

## 2021-03-06 NOTE — Plan of Care (Signed)

## 2021-03-06 NOTE — NC FL2 (Signed)
[place] MEDICAID FL2 LEVEL OF CARE SCREENING TOOL     IDENTIFICATION  Patient Name: Alfred Mercado Birthdate: Oct 31, 1935 Sex: male Admission Date (Current Location): 03/01/2021  [facility] and [place] Number:  Producer, television/film/video and Address:  [facility], [address] [place], [place] [address]      Provider Number: [id number]  Attending Physician Name and Address:  [name], MD  Relative Name and Phone Number:       Current Level of Care: Hospital Recommended Level of Care: Skilled Nursing Facility Prior Approval Number:    Date Approved/Denied:   PASRR Number: [id number]  Discharge Plan: SNF    Current Diagnoses: Patient Active Problem List   Diagnosis Date Noted   Sepsis (HCC) 03/02/2021   Cellulitis 03/02/2021   Hyponatremia 03/02/2021   Renal failure    Hypotension 02/09/2021   Falls 02/09/2021   Syncope and collapse 02/09/2021   Severe sepsis (HCC) 01/25/2021   Iron deficiency anemia 01/25/2021   Generalized weakness    Acute-on-chronic kidney injury (HCC) 01/24/2021   Complicated UTI (urinary tract infection) 01/24/2021   Renal mass, left 01/24/2021   Staghorn renal calculus 01/24/2021   Microscopic hematuria 01/24/2021   Aortic aneurysm (HCC) 01/24/2021   Closed burst fracture of lumbar vertebra (HCC) 01/24/2021   Acute respiratory failure with hypoxia (HCC) 08/13/2020   UTI (urinary tract infection) 06/02/2020   Community acquired pneumonia of right lower lobe of lung 06/02/2020   Acute hypoxemic respiratory failure (HCC) 06/02/2020   Hypokalemia 06/02/2020   Acute diastolic CHF (congestive heart failure) (HCC) 02/06/2020   Exertional shortness of breath 10/14/2019   Atrial fibrillation with RVR (HCC) 10/14/2019   Pleural effusion    Chronic heart failure with preserved ejection fraction (HCC)     Orientation RESPIRATION BLADDER Height & Weight     Self, Time, Situation, Place  Normal Continent  (Nephrostomy) Weight: 165 lb 9.1 oz (75.1 kg) Height:     BEHAVIORAL SYMPTOMS/MOOD NEUROLOGICAL BOWEL NUTRITION STATUS      Continent Diet (Please see DC Summary)  AMBULATORY STATUS COMMUNICATION OF NEEDS Skin   Extensive Assist Verbally Other (Comment) (puncture on back with guaze)                       Personal Care Assistance Level of Assistance  Bathing, Feeding, Dressing Bathing Assistance: Maximum assistance Feeding assistance: Limited assistance Dressing Assistance: Limited assistance     Functional Limitations Info  Sight, Hearing Sight Info: Impaired Hearing Info: Impaired      SPECIAL CARE FACTORS FREQUENCY  PT (By licensed PT), OT (By licensed OT)     PT Frequency: 5x/week OT Frequency: 5x/week            Contractures Contractures Info: Not present    Additional Factors Info  Code Status, Allergies Code Status Info: Full Allergies Info: NKA           Current Medications (03/06/2021):  This is the current hospital active medication list Current Facility-Administered Medications  Medication Dose Route Frequency Provider Last Rate Last Admin   acetaminophen (TYLENOL) tablet 650 mg  650 mg Oral Q6H PRN Ephriam Knuckles, Rylee, MD   650 mg at 03/05/21 1707   Or   acetaminophen (TYLENOL) suppository 650 mg  650 mg Rectal Q6H PRN Christian, Rylee, MD       albuterol (PROVENTIL) (2.5 MG/3ML) 0.083% nebulizer solution 2.5 mg  2.5 mg Inhalation Q6H PRN Elige Radon, MD  ceFEPIme (MAXIPIME) 2 g in sodium chloride 0.9 % 100 mL IVPB  2 g Intravenous Q12H Tamera Reason, RPH 200 mL/hr at 03/06/21 1150 2 g at 03/06/21 1150   linezolid (ZYVOX) tablet 600 mg  600 mg Oral Q12H Pham, Minh Q, RPH-CPP   600 mg at 03/06/21 1400   nicotine (NICODERM CQ - dosed in mg/24 hr) patch 7 mg  7 mg Transdermal Q0600 Christian, Rylee, MD   7 mg at 03/05/21 0646   umeclidinium bromide (INCRUSE ELLIPTA) 62.5 MCG/INH 1 puff  1 puff Inhalation Daily [name], MD   1 puff at  03/06/21 1359     Discharge Medications: Please see discharge summary for a list of discharge medications.  Relevant Imaging Results:  Relevant Lab Results:   Additional Information ss#[id number]. COVID vaccinations 01/02/20 and 01/26/20, 11/05/20  Mearl Latin, LCSW

## 2021-03-06 NOTE — Procedures (Signed)
Interventional Radiology Procedure Note  Procedure:   Routine exchange of right PCN. New 62F drain. To gravity  .  Complications: None  Recommendations:  - routine exchanges - Do not submerge - Routine care   Signed,  Yvone Neu. Loreta Ave, DO

## 2021-03-07 ENCOUNTER — Encounter (HOSPITAL_COMMUNITY): Payer: Self-pay | Admitting: Internal Medicine

## 2021-03-07 DIAGNOSIS — N390 Urinary tract infection, site not specified: Secondary | ICD-10-CM | POA: Diagnosis not present

## 2021-03-07 DIAGNOSIS — Z7189 Other specified counseling: Secondary | ICD-10-CM

## 2021-03-07 DIAGNOSIS — R6520 Severe sepsis without septic shock: Secondary | ICD-10-CM | POA: Diagnosis not present

## 2021-03-07 DIAGNOSIS — R400 Somnolence: Secondary | ICD-10-CM | POA: Diagnosis not present

## 2021-03-07 DIAGNOSIS — Z515 Encounter for palliative care: Secondary | ICD-10-CM | POA: Diagnosis not present

## 2021-03-07 DIAGNOSIS — A419 Sepsis, unspecified organism: Secondary | ICD-10-CM | POA: Diagnosis not present

## 2021-03-07 DIAGNOSIS — S37009A Unspecified injury of unspecified kidney, initial encounter: Secondary | ICD-10-CM | POA: Diagnosis not present

## 2021-03-07 LAB — CBC WITH DIFFERENTIAL/PLATELET
Abs Immature Granulocytes: 0.05 10*3/uL (ref 0.00–0.07)
Basophils Absolute: 0 10*3/uL (ref 0.0–0.1)
Basophils Relative: 1 %
Eosinophils Absolute: 0.1 10*3/uL (ref 0.0–0.5)
Eosinophils Relative: 2 %
HCT: 27.5 % — ABNORMAL LOW (ref 39.0–52.0)
Hemoglobin: 9.1 g/dL — ABNORMAL LOW (ref 13.0–17.0)
Immature Granulocytes: 1 %
Lymphocytes Relative: 12 %
Lymphs Abs: 0.7 10*3/uL (ref 0.7–4.0)
MCH: 27.4 pg (ref 26.0–34.0)
MCHC: 33.1 g/dL (ref 30.0–36.0)
MCV: 82.8 fL (ref 80.0–100.0)
Monocytes Absolute: 0.5 10*3/uL (ref 0.1–1.0)
Monocytes Relative: 8 %
Neutro Abs: 4.4 10*3/uL (ref 1.7–7.7)
Neutrophils Relative %: 76 %
Platelets: 211 10*3/uL (ref 150–400)
RBC: 3.32 MIL/uL — ABNORMAL LOW (ref 4.22–5.81)
RDW: 21.3 % — ABNORMAL HIGH (ref 11.5–15.5)
WBC: 5.8 10*3/uL (ref 4.0–10.5)
nRBC: 0 % (ref 0.0–0.2)

## 2021-03-07 LAB — URINALYSIS, ROUTINE W REFLEX MICROSCOPIC
Bilirubin Urine: NEGATIVE
Glucose, UA: NEGATIVE mg/dL
Ketones, ur: NEGATIVE mg/dL
Nitrite: NEGATIVE
Protein, ur: 30 mg/dL — AB
RBC / HPF: >50 RBC/hpf — ABNORMAL HIGH (ref 0–5)
Specific Gravity, Urine: 1.011 (ref 1.005–1.030)
WBC, UA: >50 WBC/hpf — ABNORMAL HIGH (ref 0–5)
pH: 6 (ref 5.0–8.0)

## 2021-03-07 LAB — BASIC METABOLIC PANEL
Anion gap: 5 (ref 5–15)
BUN: 25 mg/dL — ABNORMAL HIGH (ref 8–23)
CO2: 13 mmol/L — ABNORMAL LOW (ref 22–32)
Calcium: 9.3 mg/dL (ref 8.9–10.3)
Chloride: 114 mmol/L — ABNORMAL HIGH (ref 98–111)
Creatinine, Ser: 1.5 mg/dL — ABNORMAL HIGH (ref 0.61–1.24)
GFR, Estimated: 46 mL/min — ABNORMAL LOW (ref >60)
Glucose, Bld: 108 mg/dL — ABNORMAL HIGH (ref 70–99)
Potassium: 3.3 mmol/L — ABNORMAL LOW (ref 3.5–5.1)
Sodium: 132 mmol/L — ABNORMAL LOW (ref 135–145)

## 2021-03-07 MED ORDER — DOXYCYCLINE HYCLATE 100 MG PO TABS
100.0000 mg | ORAL_TABLET | Freq: Two times a day (BID) | ORAL | Status: DC
  Administered 2021-03-07 – 2021-03-09 (×5): 100 mg via ORAL
  Filled 2021-03-07 (×5): qty 1

## 2021-03-07 MED ORDER — SODIUM BICARBONATE 650 MG PO TABS
650.0000 mg | ORAL_TABLET | Freq: Every day | ORAL | Status: DC
  Administered 2021-03-07 – 2021-03-08 (×2): 650 mg via ORAL
  Filled 2021-03-07 (×2): qty 1

## 2021-03-07 MED ORDER — SODIUM BICARBONATE 8.4 % IV SOLN
50.0000 meq | Freq: Once | INTRAVENOUS | Status: AC
  Administered 2021-03-07: 50 meq via INTRAVENOUS
  Filled 2021-03-07: qty 50

## 2021-03-07 NOTE — Discharge Instructions (Addendum)
Mr. Reeves Forth were hospitalized due to a severe infection due to cellulitis around your nephrostomy tube site.  You were treated with antibiotics.  Per urology's recommendations we removed the nephrostomy tube because you are urinating well on her own.  We will continue to monitor for signs of infection.  Please continue antibiotics for an additional 3 days.  Please continue your other medications as prescribed.  You for allowing [place] to take part in your care.  We wish you the best!

## 2021-03-07 NOTE — Progress Notes (Signed)
PT Cancellation Note  Patient Details Name: Alfred Mercado MRN: [id number] DOB: 1935-10-02   Cancelled Treatment:    Reason Eval/Treat Not Completed: Patient declined, no reason specified Refuses therapy today due to "not feeling good". Unable to convince him to participate in even low level activities. Will continue efforts.    Madelaine Etienne, DPT, PN1   Supplemental Physical Therapist [facility]    Pager [phone] Acute Rehab Office [phone]

## 2021-03-07 NOTE — Progress Notes (Addendum)
HD#5 SUBJECTIVE:  Patient Summary: Alfred Mercado is a 85 y.o. with a pertinent PMH of HFpEF, persistent atrial fibrillation, COPD, CKD stage IIIb, pyelonephritis with left perinephric abscess status post nephrostomy tube (on 06/19), right staghorn calculus with right percutaneous nephrostomy tube, who presented with signs and symptoms concerning for sepsis.   Overnight Events: No significant overnight events    Interm History: Patient resting in bed.  He states that he continues to have pain around his nephrostomy tube which limited our exam today.  He does state that he is feeling off but is unable to qualify how he feels.  He does appear down/depressed.  He denies any other significant symptoms.  I counseled him on the importance of physical therapy particularly moving forward.  Patient is in agreement.  OBJECTIVE:  Vital Signs: Vitals:   03/06/21 1551 03/06/21 1943 03/06/21 2355 03/07/21 0732  BP: 102/72 107/69 (!) 102/49 99/64  Pulse: 94 100 (!) 103 93  Resp: 20 (!) 22 20 19   Temp: 97.7 F (36.5 C) (!) 97.4 F (36.3 C) (!) 97.3 F (36.3 C) (!) 97.4 F (36.3 C)  TempSrc: Oral Oral Oral Oral  SpO2: 100% 100% 99% 98%  Weight:       Supplemental O2: Room Air SpO2: 98 % O2 Flow Rate (L/min): 2 L/min  Filed Weights   03/03/21 0455 03/04/21 0405 03/05/21 0543  Weight: 72.4 kg 72.8 kg 75.1 kg     Intake/Output Summary (Last 24 hours) at 03/07/2021 0816 Last data filed at 03/07/2021 0100 Gross per 24 hour  Intake 540 ml  Output 2375 ml  Net -1835 ml   Net IO Since Admission: -6,675 mL [03/07/21 0816]  Physical Exam: Physical Exam Constitutional:      Appearance: Normal appearance.  HENT:     Head: Normocephalic and atraumatic.  Eyes:     Extraocular Movements: Extraocular movements intact.  Cardiovascular:     Rate and Rhythm: Normal rate.     Pulses: Normal pulses.  Pulmonary:     Effort: Pulmonary effort is normal. No respiratory distress.  Abdominal:      General: There is no distension.     Tenderness: There is abdominal tenderness (abdominal pain around his nephrostomy tube.). There is guarding.  Musculoskeletal:        General: No swelling. Normal range of motion.     Cervical back: Normal range of motion.  Skin:    General: Skin is warm and dry.  Neurological:     General: No focal deficit present.     Mental Status: He is alert.  Psychiatric:        Mood and Affect: Mood is depressed.    Patient Lines/Drains/Airways Status     Active Line/Drains/Airways     Name Placement date Placement time Site Days   Peripheral IV 03/01/21 20 G Anterior;Right Forearm 03/01/21  2150  Forearm  6   Closed System Drain 1 Left;Posterior Back 10.2 Fr. 01/25/21  1254  Back  41   Nephrostomy Right 10 Fr. 03/06/21  1106  Right  1   Wound / Incision (Open or Dehisced) 01/25/21 Puncture Back Left;Posterior;Lower;Lateral peri-renal drain insertion site 01/25/21  1305  Back  41   Wound / Incision (Open or Dehisced) 01/25/21 Puncture Back Right;Posterior;Lower;Lateral right kidney drain insertion site  01/25/21  1305  Back  41            Pertinent Labs: CBC Latest Ref Rng & Units 03/07/2021 03/06/2021 03/05/2021  WBC 4.0 - 10.5 K/uL 5.8 4.6 4.3  Hemoglobin 13.0 - 17.0 g/dL 0.0(X) 3.8(H) 8.2(X)  Hematocrit 39.0 - 52.0 % 27.5(L) 25.7(L) 25.4(L)  Platelets 150 - 400 K/uL 211 227 212    CMP Latest Ref Rng & Units 03/07/2021 03/06/2021 03/05/2021  Glucose 70 - 99 mg/dL 937(J) 96 696(V)  BUN 8 - 23 mg/dL 89(F) 81(O) 17(P)  Creatinine 0.61 - 1.24 mg/dL 1.02(H) 8.52(D) 7.82(U)  Sodium 135 - 145 mmol/L 132(L) 135 136  Potassium 3.5 - 5.1 mmol/L 3.3(L) 3.7 3.5  Chloride 98 - 111 mmol/L 114(H) 115(H) 117(H)  CO2 22 - 32 mmol/L 13(L) 14(L) 12(L)  Calcium 8.9 - 10.3 mg/dL 9.3 9.2 9.0  Total Protein 6.5 - 8.1 g/dL - - -  Total Bilirubin 0.3 - 1.2 mg/dL - - -  Alkaline Phos 38 - 126 U/L - - -  AST 15 - 41 U/L - - -  ALT 0 - 44 U/L - - -    No results for  input(s): GLUCAP in the last 72 hours.   Pertinent Imaging: IR NEPHROSTOMY EXCHANGE RIGHT  Result Date: 03/06/2021 INDICATION: 85 year old male with a history of right-sided PCN for staghorn calculus 01/25/2021, returns with partial displacement EXAM: IR EXCHANGE NEPHROSTOMY RIGHT COMPARISON:  None. MEDICATIONS: None ANESTHESIA/SEDATION: Fentanyl 25 mcg IV; Versed 1.0 mg IV Moderate Sedation Time:  10 minutes The patient was continuously monitored during the procedure by the interventional radiology nurse under my direct supervision. CONTRAST:  29mL OMNIPAQUE IOHEXOL 300 MG/ML SOLN - administered into the collecting system(s) FLUOROSCOPY TIME:  Fluoroscopy Time: 0 minutes 42 seconds COMPLICATIONS: None PROCEDURE: Informed written consent was obtained from the patient after a thorough discussion of the procedural risks, benefits and alternatives. All questions were addressed. Maximal Sterile Barrier Technique was utilized including caps, mask, sterile gowns, sterile gloves, sterile drape, hand hygiene and skin antiseptic. A timeout was performed prior to the initiation of the procedure. Right: 1% lidocaine was used for local anesthesia. Small amount of contrast was infused confirming location in the collecting system. Modified Seldinger technique was then used to exchange for a new 10 [place] percutaneous nephrostomy. Catheter was formed in the collecting system and contrast confirmed location. Catheter was sutured in location and attached to gravity drainage. Patient tolerated the procedure well and remained hemodynamically stable throughout. No complications were encountered and no significant blood loss. IMPRESSION: Status post routine exchange of right-sided PCN. Signed, Yvone Neu. Reyne Dumas, RPVI [facility] Radiology Electronically Signed   By: Gilmer Mor D.O.   On: 03/06/2021 11:32    ASSESSMENT/PLAN:  Assessment: Principal Problem:   Sepsis  (HCC) Active Problems:   Chronic heart failure with preserved ejection fraction (HCC)   Acute-on-chronic kidney injury (HCC)   Complicated UTI (urinary tract infection)   Staghorn renal calculus   Cellulitis   Hyponatremia  Plan:  #Complicated UTI with indwelling right nephrostomy tube #Purulent Cellulitis from nephrostomy tube insertion site  S/p nephrostomy tube exchange yesterday. Urine cultures resulted with staff epidermidis resistant to clindamycin, oxacillin, and bactrim.  -Transition to oral Doxy for total of 10 day course -Appreciate palliative care's assistance with this patient's goals of care conversations. -Patient will need eventually need SNF placement for subacute PT. - Given persistent AMS and acidosis, rechecking UA  # Severe Non Anion Gap Metabolic Acidosis: Bicarb is significantly low at 13. Unclear etiology. Renal function appears at baseline (CKD IIb). Bicarb three weeks ago was 20 with similar GFR. He has  not been receiving NS.  - Give IV sodium bicarb 50 mEq x 1  - Start oral sodium bicarb QD  - Monitor labs   #Encephalopathy Initially felt to be 2/2 to his sepsis, but sepsis has resolved and AMS has persisted. AKI has resolved and his BUN has been downtrending. He is somnolent. Awakens to voice and is oriented, but quickly falls back asleep and refuses to participate in conversation. He does have a significant NAGMA of unclear etiology. Starting sodium bicarb. Monitor mental status.   AKI on CKD IIIb: Resolved, back to baseline renal function.   #Atrial fibrillation - Continue Eliquis 2.5 twice daily -Tolerating it well.   #Normocytic Anemia Hx of IDA, iron 33, tibc 277, ferritin 63 on 6/1. Received iron transfusion on last admission when these labs were collected. - Hgb Stable today at 9.1  Best Practice: Diet: Soft diet IVF: Fluids: None, Rate: None VTE: apixaban (ELIQUIS) tablet 2.5 mg Start: 03/06/21 2200 Code: Full AB: Day 5 of vancomycin and  cefepime will transition to doxycycline to finish 10 days Therapy Recs: SNF, DME: none Family Contact: Not performed DISPO: Anticipated discharge 1-2 days to Skilled nursing facility pending  palliative care consult and SNF placement. .  Signature: Chari Manning, D.O.  Internal Medicine Resident, PGY-3 [facility]  Pager: [phone] 8:16 AM, 03/07/2021   Please contact the on call pager after 5 pm and on weekends at [phone].

## 2021-03-07 NOTE — Consult Note (Signed)
Consultation Note Date: 03/07/2021   Patient Name: Alfred Mercado  DOB: Jul 08, 1936  MRN: [id number]  Age / Sex: 85 y.o., male  PCP: Alfred Mercado, Enobong, MD Referring Physician: Reymundo Mercado, Carolyn, MD  Reason for Consultation: Establishing goals of care  HPI/Patient Profile: 85 y.o. male  with past medical history of heart failure with preserved EF, A. fib, pleural effusion, COPD/tobacco use, CKD 3, recent history of complicated UTI with perinephric abscess, staghorn calculi with right percutaneous nephrostomy tube placed 6/1, recent admit with dehydration/hypotension 6/17, iron deficiency anemia, admitted on 03/01/2021 with sepsis, staph epidermis UTI, purulent cellulitis.   Clinical Assessment and Goals of Care: I have reviewed medical records including EPIC notes, labs and imaging, received report from RN, assessed the patient.  Alfred Mercado is lying quietly in bed in a dark room.  He has obvious hearing loss.  He will make but not keep eye contact.  He is able to tell me his name, but not where we are.  I believe that he can make his basic needs known.  There is no family/friend at bedside at this time.  We briefly talk about healthcare power of attorney.  Alfred Mercado tells me that he has children, but declines to tell me their name or how to contact them.  It is noted that Alfred Mercado has left a note and phone number at bedside.  He eventually agrees for me to call Alfred Mercado, but shares that he does not want to bother her.    Call to friend, Alfred Mercado at [phone].  Left voicemail message requesting return call.  Call to [facility] front desk at [phone], no answer.   Advanced directives, concepts specific to code status, and discussed.  I asked Alfred Mercado if he would want life support, machines to help keep him living.  At this point he is unable to have meaningful discussions, stating that he  is unsure.  It is noted that he has told nursing staff that he wants to be left alone so that he can die.  Discussed the importance of continued conversation with family and the medical providers regarding overall plan of care and treatment options, ensuring decisions are within the context of the patient's values and GOCs. Questions and concerns were addressed.   The family was encouraged to call with questions or concerns.  PMT will continue to support holistically.  Conference with attending, bedside nursing staff, transition of care team related to patient condition, needs, goals of care, disposition.   HCPOA  OTHER -Alfred Mercado tells me that he has children, but declines to name them or give his contact information.  As noted in the chart [address] [place] the last [place] [facility].  He considers people there to be his family.    SUMMARY OF RECOMMENDATIONS   At this point full scope/full code by default PMT to continue to attempt to reach family/friends for support If he continues to worsen he may need DSS guardianship  Emergency/temporary DSS guardianship can make decisions,  but not life and death decisions such as DNR   Code Status/Advance Care Planning: Full code -by default.  Unable to hold meaningful discussions with [name].  Although he tells nursing staff that he is ready to die, he is unable to endorse DNR.  Symptom Management:  Per hospitalist, no additional needs at this time.  Palliative Prophylaxis:  Frequent Pain Assessment and Oral Care  Additional Recommendations (Limitations, Scope, Preferences): Full Scope Treatment  Psycho-social/Spiritual:  Desire for further Chaplaincy support:no Additional Recommendations: Caregiving  Support/Resources and Education on Hospice  Prognosis:  Unable to determine, based on outcomes.  Guarded at this point.  If no improvement would likely qualify for residential hospice.  Discharge Planning:   To be determined, based on outcomes.       Primary Diagnoses: Present on Admission:  Sepsis (HCC)  Chronic heart failure with preserved ejection fraction (HCC)  Acute-on-chronic kidney injury (HCC)  Cellulitis  Hyponatremia   I have reviewed the medical record, interviewed the patient and family, and examined the patient. The following aspects are pertinent.  Past Medical History:  Diagnosis Date   Acute heart failure with preserved ejection fraction (HCC)    Atrial fibrillation with rapid ventricular response (HCC) 07/11/2019   Social History   Socioeconomic History   Marital status: Divorced    Spouse name: Not on file   Number of children: Not on file   Years of education: Not on file   Highest education level: Not on file  Occupational History   Not on file  Tobacco Use   Smoking status: Light Smoker    Pack years: 0.00   Smokeless tobacco: Never  Substance and Sexual Activity   Alcohol use: Not Currently   Drug use: Never   Sexual activity: Not Currently  Other Topics Concern   Not on file  Social History Narrative   Not on file   Social Determinants of Health   Financial Resource Strain: Not on file  Food Insecurity: No Food Insecurity   Worried About Running Out of Food in the Last Year: Never true   Ran Out of Food in the Last Year: Never true  Transportation Needs: No Transportation Needs   Lack of Transportation (Medical): No   Lack of Transportation (Non-Medical): No  Physical Activity: Not on file  Stress: Not on file  Social Connections: Not on file   Family History  Problem Relation Age of Onset   Aneurysm Father    Scheduled Meds:  apixaban  2.5 mg Oral BID   doxycycline  100 mg Oral Q12H   nicotine  7 mg Transdermal Q0600   umeclidinium bromide  1 puff Inhalation Daily   Continuous Infusions: PRN Meds:.acetaminophen **OR** acetaminophen, albuterol Medications Prior to Admission:  Prior to Admission medications   Medication Sig  Start Date End Date Taking? Authorizing Provider  acetaminophen (TYLENOL) 325 MG tablet Take 650 mg by mouth at bedtime.   Yes [provider]  albuterol (VENTOLIN HFA) 108 (90 Base) MCG/ACT inhaler Inhale 2 puffs into the lungs every 6 (six) hours as needed for wheezing or shortness of breath. 11/30/20  Yes Alfred Register, MD  apixaban (ELIQUIS) 2.5 MG TABS tablet Take 1 tablet (2.5 mg total) by mouth 2 (two) times daily. 02/12/21 05/13/21 Yes Amponsah, Flossie Buffy, MD  bisacodyl (DULCOLAX) 5 MG EC tablet Take 20 mg by mouth daily as needed for moderate constipation.   Yes [provider]  diltiazem (CARDIZEM  CD) 120 MG 24 hr capsule Take 120 mg by mouth daily. 02/23/21  Yes [provider]  furosemide (LASIX) 40 MG tablet Take 1 tablet (40 mg total) by mouth daily. 02/12/21 05/13/21 Yes Steffanie Rainwater, MD  hydrOXYzine (ATARAX/VISTARIL) 10 MG tablet Take 1 tablet (10 mg total) by mouth 3 (three) times daily as needed for itching. 02/02/21  Yes Albertha Ghee, MD  metoprolol tartrate (LOPRESSOR) 25 MG tablet TAKE 1 TABLET(25 MG) BY MOUTH TWICE DAILY Patient taking differently: Take 25 mg by mouth 2 (two) times daily. 11/30/20  Yes Alfred Register, MD  nicotine (NICODERM CQ - DOSED IN MG/24 HR) 7 mg/24hr patch Place 1 patch (7 mg total) onto the skin daily at 6 (six) AM. 02/03/21  Yes Albertha Ghee, MD  OVER THE COUNTER MEDICATION Take 1-2 tablets by mouth at bedtime. Sleeping pill   Yes [provider]  polyethylene glycol powder (GLYCOLAX/MIRALAX) 17 GM/SCOOP powder Take 17 g by mouth daily as needed for mild constipation.   Yes [provider]  tamsulosin (FLOMAX) 0.4 MG CAPS capsule Take 1 capsule (0.4 mg total) by mouth daily. 02/03/21  Yes Albertha Ghee, MD  Tiotropium Bromide Monohydrate (SPIRIVA RESPIMAT) 2.5 MCG/ACT AERS INHALE 2 PUFFS BY MOUTH DAILY Patient taking differently: Inhale 2 puffs into the lungs daily. 11/30/20  Yes Alfred Register, MD  Vitamin D,  Ergocalciferol, (DRISDOL) 1.25 MG (50000 UNIT) CAPS capsule TAKE 1 CAPSULE BY MOUTH 1 TIME A WEEK Patient taking differently: Take 50,000 Units by mouth every Saturday at 6 PM. 02/24/21  Yes Alfred Register, MD  Misc. Devices MISC Power wheelchair.  Diagnosis atrial fibrillation, gait abnormality 12/01/20   Alfred Register, MD   No Known Allergies Review of Systems  Unable to perform ROS: Acuity of condition   Physical Exam Vitals and nursing note reviewed.  Constitutional:      General: He is not in acute distress.    Appearance: He is ill-appearing.  Cardiovascular:     Rate and Rhythm: Normal rate.  Pulmonary:     Effort: Pulmonary effort is normal. No respiratory distress.  Musculoskeletal:        General: No swelling.  Skin:    General: Skin is warm and dry.  Neurological:     Mental Status: He is alert. He is disoriented.     Comments: Able to tell me his name, but not where we are    Vital Signs: BP 98/61 (BP Location: Left Arm)   Pulse 93   Temp 97.6 F (36.4 C) (Oral)   Resp 20   Wt 75.1 kg   SpO2 100%   BMI 21.84 kg/m  Pain Scale: 0-10   Pain Score: 0-No pain   SpO2: SpO2: 100 % O2 Device:SpO2: 100 % O2 Flow Rate: .O2 Flow Rate (L/min): 2 L/min  IO: Intake/output summary:  Intake/Output Summary (Last 24 hours) at 03/07/2021 1437 Last data filed at 03/07/2021 1213 Gross per 24 hour  Intake 460 ml  Output 1800 ml  Net -1340 ml    LBM: Last BM Date:  (PTA) Baseline Weight: Weight: 72.4 kg Most recent weight: Weight: 75.1 kg     Palliative Assessment/Data:   Flowsheet Rows    Flowsheet Row Most Recent Value  Intake Tab   Referral Department Hospitalist  Unit at Time of Referral Med/Surg Unit  Palliative Care Primary Diagnosis Sepsis/Infectious Disease  Date Notified 03/06/21  Palliative Care Type New Palliative care  Reason for referral Clarify Goals of Care  Date  of Admission 03/01/21  Date first seen by Palliative Care 03/07/21  # of days  Palliative referral response time 1 Day(s)  # of days IP prior to Palliative referral 5  Clinical Assessment   Palliative Performance Scale Score 30%  Pain Max last 24 hours Not able to report  Pain Min Last 24 hours Not able to report  Dyspnea Max Last 24 Hours Not able to report  Dyspnea Min Last 24 hours Not able to report  Psychosocial & Spiritual Assessment   Palliative Care Outcomes        Time In: 1400 Time Out: 1450 Time Total: 50 minutes  Greater than 50%  of this time was spent counseling and coordinating care related to the above assessment and plan.  Signed by: Katheran Awe, NP   Please contact Palliative Medicine Team phone at [phone] for questions and concerns.  For individual provider: See Loretha Stapler

## 2021-03-07 NOTE — TOC Progression Note (Signed)
Transition of Care [facility]) - Progression Note    Patient Details  Name: Alfred Mercado MRN: [id number] Date of Birth: [birth year]  Transition of [facility]) CM/SW Contact  Erin Sons, [place] Phone Number: 03/07/2021, 10:24 AM  Clinical Narrative:     CSW called pt on room phone. CSW attempts to discuss PT recommendation for SNF. He states he has not been to one before. He then explains he does not feel good and to call back tomorrow. CSW explains that it is important to discuss discharge plans and attempts to get pt to discuss SNF a little bit longer; CSW explains conversation would be brief. Pt refuses to discuss further and requests CSW to call back tomorrow.   Expected Discharge Plan: Skilled Nursing Facility Barriers to Discharge: Continued Medical Work up  Expected Discharge Plan and Services Expected Discharge Plan: Skilled Nursing Facility In-house Referral: Clinical Social Work, Chaplain     Living arrangements for the past 2 months: Hotel/Motel                                       Social Determinants of Health (SDOH) Interventions    Readmission Risk Interventions No flowsheet data found.

## 2021-03-08 DIAGNOSIS — R6520 Severe sepsis without septic shock: Secondary | ICD-10-CM | POA: Diagnosis not present

## 2021-03-08 DIAGNOSIS — A419 Sepsis, unspecified organism: Secondary | ICD-10-CM | POA: Diagnosis not present

## 2021-03-08 DIAGNOSIS — N390 Urinary tract infection, site not specified: Secondary | ICD-10-CM | POA: Diagnosis not present

## 2021-03-08 DIAGNOSIS — Z7189 Other specified counseling: Secondary | ICD-10-CM | POA: Diagnosis not present

## 2021-03-08 DIAGNOSIS — Z515 Encounter for palliative care: Secondary | ICD-10-CM | POA: Diagnosis not present

## 2021-03-08 DIAGNOSIS — N200 Calculus of kidney: Secondary | ICD-10-CM | POA: Diagnosis not present

## 2021-03-08 LAB — MAGNESIUM: Magnesium: 1.7 mg/dL (ref 1.7–2.4)

## 2021-03-08 LAB — CBC WITH DIFFERENTIAL/PLATELET
Abs Immature Granulocytes: 0.04 10*3/uL (ref 0.00–0.07)
Basophils Absolute: 0.1 10*3/uL (ref 0.0–0.1)
Basophils Relative: 1 %
Eosinophils Absolute: 0.2 10*3/uL (ref 0.0–0.5)
Eosinophils Relative: 3 %
HCT: 27.3 % — ABNORMAL LOW (ref 39.0–52.0)
Hemoglobin: 9 g/dL — ABNORMAL LOW (ref 13.0–17.0)
Immature Granulocytes: 1 %
Lymphocytes Relative: 17 %
Lymphs Abs: 1 10*3/uL (ref 0.7–4.0)
MCH: 27.3 pg (ref 26.0–34.0)
MCHC: 33 g/dL (ref 30.0–36.0)
MCV: 82.7 fL (ref 80.0–100.0)
Monocytes Absolute: 0.6 10*3/uL (ref 0.1–1.0)
Monocytes Relative: 10 %
Neutro Abs: 3.8 10*3/uL (ref 1.7–7.7)
Neutrophils Relative %: 68 %
Platelets: 219 10*3/uL (ref 150–400)
RBC: 3.3 MIL/uL — ABNORMAL LOW (ref 4.22–5.81)
RDW: 21.3 % — ABNORMAL HIGH (ref 11.5–15.5)
WBC: 5.6 10*3/uL (ref 4.0–10.5)
nRBC: 0 % (ref 0.0–0.2)

## 2021-03-08 LAB — GLUCOSE, CAPILLARY: Glucose-Capillary: 137 mg/dL — ABNORMAL HIGH (ref 70–99)

## 2021-03-08 LAB — BASIC METABOLIC PANEL
Anion gap: 8 (ref 5–15)
BUN: 25 mg/dL — ABNORMAL HIGH (ref 8–23)
CO2: 14 mmol/L — ABNORMAL LOW (ref 22–32)
Calcium: 9.3 mg/dL (ref 8.9–10.3)
Chloride: 114 mmol/L — ABNORMAL HIGH (ref 98–111)
Creatinine, Ser: 1.56 mg/dL — ABNORMAL HIGH (ref 0.61–1.24)
GFR, Estimated: 44 mL/min — ABNORMAL LOW (ref >60)
Glucose, Bld: 88 mg/dL (ref 70–99)
Potassium: 3 mmol/L — ABNORMAL LOW (ref 3.5–5.1)
Sodium: 136 mmol/L (ref 135–145)

## 2021-03-08 MED ORDER — POTASSIUM CHLORIDE CRYS ER 20 MEQ PO TBCR
40.0000 meq | EXTENDED_RELEASE_TABLET | Freq: Two times a day (BID) | ORAL | Status: AC
  Administered 2021-03-08 (×2): 40 meq via ORAL
  Filled 2021-03-08 (×2): qty 2

## 2021-03-08 MED ORDER — MELATONIN 3 MG PO TABS
3.0000 mg | ORAL_TABLET | Freq: Every day | ORAL | Status: DC
  Administered 2021-03-08 – 2021-03-17 (×10): 3 mg via ORAL
  Filled 2021-03-08 (×10): qty 1

## 2021-03-08 MED ORDER — SODIUM BICARBONATE 8.4 % IV SOLN
INTRAVENOUS | Status: DC
  Filled 2021-03-08 (×4): qty 1000

## 2021-03-08 NOTE — Progress Notes (Signed)
    Subjective:   Overnight Events: No acute overnight events  Patient sleeping on approach, wakes up and we assisted him is sitting up in bed.  He reports feeling awful yesterday and having episode of feeling lightheaded.  Multiple attempts made to clarify patients wishes if his condition should worsen without getting direct answers. He say he puts his trust in [place] as his doctors and believes he was going to be okay.    Objective:  Vital signs in last 24 hours: Vitals:   03/07/21 1927 03/07/21 2348 03/07/21 2349 03/08/21 0417  BP: 105/64 (!) 90/59 97/75 105/63  Pulse: 85 93 94 89  Resp: (!) 21 (!) 21 16 20   Temp: 97.8 F (36.6 C) 98 F (36.7 C) 98 F (36.7 C) 97.8 F (36.6 C)  TempSrc: Oral Oral Oral Oral  SpO2:  98% 98% 99%  Weight:       Supplemental O2: Room Air SpO2: 99 % O2 Flow Rate (L/min): 2 L/min  Physical Exam:   General: Elderly , frail, appears ill  Cardiovascular: Irregular regular rhythm, normal rate no murmurs Pulmonary : Normal work of breathing, CTAB Abdominal: soft, nontender,  bowel sounds present Back: clean dressing, R. nephrostomy tube in place  Antibiotics Cefazolin 7/6-7/7 Cefepime 7/7 -> 7/12 Vancomycin 7/8 -> 7/12 Doxycycline 7/12->  Culture data Blood cultures NGTD x5 days 7/6 urine cultures: Staph epidermis, 40 K colonies, susceptibilities below 7/12 urine cultures: Pending  R.Nephrostomy tube exchange date : 03/06/2021  Assessment/Plan:   Principal Problem:   Sepsis (HCC) Active Problems:   Chronic heart failure with preserved ejection fraction (HCC)   Acute-on-chronic kidney injury (HCC)   Complicated UTI (urinary tract infection)   Staghorn renal calculus   Cellulitis   Hyponatremia   Somnolence   Patient Summary: Hospital day 7 for Alfred Mercado a 85 y.o. person living with HFpEF, persistent atrial fibrillation, COPD, CKD 3B , recent emphysematous pyelitis and left perinephric abscess s/p nephrostomy tube ( removed  before discharge 6/19) , and right staghorn calculus with right percutaneous nephrostomy tube admitted with sepsis.   #Complicated UTI, Staph epidermis #Purulent Cellulitis from R. Nephrostomy tube insertion site - Patient came in without fever or leukocytosis.  Treated for 7 days with IV antibiotics.  Switch to oral doxycycline yesterday and patient's mentation slightly improved today compared to yesterday. Will continue doxycycline.  - Follow repeat urine cultures. If patient is not clearing infection we will need to consult urology to discuss treatment options.   #Capacity Appreciate palliative attempting goals of care discussion with patient. Formal evaluation by psychiatry today confirms patient does not have capacity.  Consult placed with CSW for guardianship.  #Severe Non Anion Gap Metabolic Acidosis - unclear etiology, Bicarb 14, started on sodium bicarb infusion  #Atrial fibrillation - Continue Eliquis 2.5 twice daily  #Normocytic Anemia Hx of IDA, iron 33, tibc 277, ferritin 63 on 6/1. Received iron transfusion on last admission when these labs were collected.  - Hgb 9, stable   Diet:  soft diet , NPO at midnight VTE: Eliquis  Code: Full PT/OT recs: SNF   Dispo: Anticipated discharge to Skilled nursing facility pending improvement in medical condition.   8/1, MD PGY3 Internal Medicine Pager: [phone] Please contact the on call pager after 5 pm and on weekends at [phone].

## 2021-03-08 NOTE — Plan of Care (Signed)

## 2021-03-08 NOTE — Progress Notes (Signed)
This chaplain responded to the Pt. request for prayer. The Pt. shares he appreciates me asking if I can sit down beside him and as he accepts my invitation.  The chaplain offered a listening presence and did not ask the Pt. to make any choices during the time together.  The chaplain understands the Pt. "home and family" is in the shared daily living among the motel community.  The Pt. shares he was talking to the motel owner before the chaplain's visit.  The motel family calls the Pt. "Pop".  The chaplain understands the Pt. role in the motel community reflects his understanding of God's love for everyone. The Pt. is identifying with the perceived anticipatory grief or the expected separation from his "family" as a result of his illness.    The Pt. stated multiple times "he doesn't know how he is going to tell his family he is not coming home."  The chaplain understands the Pt. heard the medical team's morning message as he is dying. At this time, the Pt. presents himself as unable to rethink the possibility of he is dying.  The chaplain accepted the Pt. request for prayer with the NT and a F/U spiritual care visit.

## 2021-03-08 NOTE — Progress Notes (Signed)
CSW received consult to pursue Guardianship on patient as he has no documented family for decision making that he will share with hospital to contact. CSW requested competency evaluation prior to contacting APS.   Joaquin Courts, MSW, [facility]

## 2021-03-08 NOTE — Progress Notes (Signed)
PT Cancellation Note  Patient Details Name: Alfred Mercado MRN: [id number] DOB: 1936/08/23   Cancelled Treatment:    Reason Eval/Treat Not Completed: Other (comment).  Checked on pt at 1405 and again at 62.  Palliative medicine team in room and engaged in conversation with pt.  PT to check back later as time allows.   Thanks,  Corinna Capra, PT, DPT  Acute Rehabilitation Ortho Tech Supervisor [phone] pager #[phone] office      [name] 03/08/2021, 2:24 PM

## 2021-03-08 NOTE — Progress Notes (Signed)
PT Cancellation Note  Patient Details Name: Alfred Mercado MRN: [id number] DOB: 1936-04-11   Cancelled Treatment:    Reason Eval/Treat Not Completed: Patient declined, no reason specified.  "They told me I am going to die tomorrow".  Pt reports he has too much on his mind to process right now and although he wants to go somewhere for rehab, he is not ready to start working with physical therapy today.    Thanks,  Corinna Capra, PT, DPT  Acute Rehabilitation Ortho Tech Supervisor [phone] pager #[phone] office      Lurena Joiner B Weslie Rasmus 03/08/2021, 3:04 PM

## 2021-03-08 NOTE — Progress Notes (Signed)
Palliative: Alfred Mercado is lying quietly in bed.  He is resistant to talk at first.  He is alert, able to tell me his name, but declines to answer other questions.  I believe that he is able to make his basic needs known.  There is no family at bedside at this time.  We talked about his acute health concerns.  He tells me that he feels "rushed".  He shares that he just heard about what was wrong with him this morning.  I asked him what he was told and who told him, but he is unable to give me any accurate answers.  I believe that he is confabulating.  At this point he does tell me that he would go to short-term rehab, but he states multiple times that he "wants to talk to those 2 ladies".  I share that the transition care team is working to find a facility who would accept him for rehab.  When they have facilities that would accept him, they would let him know of his choices.   We talked about respecting his wishes, what he does and does not want.  We talked about CODE STATUS.  At this point all Alfred Mercado can tell me is, "I want to live".  At one point he does endorse DNR, that he would not want to be kept alive on machines, but he returns to, "I want to live".  At some point in our conversation he tells me that he will make plans over the next few days.  When I ask him what plans he means, he tells me "my burial plans".  I share that he is not dying at this point, these are discussions we have out of respect for him, what he does and does not want.  He clearly needs assistance with understanding his complex medical concerns and choices.  He continues to decline for the medical team to reach out to his family.  Call to friend, Park Pope at [phone].  Left voicemail message.   Conference with attending, bedside nursing staff, transition of care team related to patient condition, needs, goals of care.  Plan: Continue full scope/full code.  Agreeable to short-term rehab.  Would benefit from  outpatient palliative services, but I am unsure that he understands his choices.  40 minutes Lillia Carmel, NP Palliative medicine team Team phone [phone] Greater than 50% of this time was spent counseling and coordinating care related to the above assessment and plan.

## 2021-03-08 NOTE — Consult Note (Signed)
[facility] Face-to[facility] Consult   Reason for Consult:  Capacity Referring Physician:  Reymundo Pollarolyn Guilloud, MD Patient Identification: Alfred Mercado MRN:  [id number] Principal Diagnosis: Sepsis [facility](HCC) Diagnosis:  Principal Problem:   Sepsis (HCC) Active Problems:   Chronic heart failure with preserved ejection fraction (HCC)   Acute-on-chronic kidney injury (HCC)   Complicated UTI (urinary tract infection)   Staghorn renal calculus   Cellulitis   Hyponatremia   Somnolence   Total Time spent with patient: 15 minutes  Subjective:   Alfred Mercado is a 85 y.o. male patient admitted with signs and symptoms concerning for sepsis who has a PMH of HFpEF, persistent atrial fibrillation, COPD, CKD stage IIIb, pyelonephritis with left perinephric abscess status post nephrostomy tube (on 06/19), right staghorn calculus with right percutaneous nephrostomy tube.  HPI:  On assessment this AM patient is resting in bed and arouses to very loud verbal stimuli. Patient is oriented and reports that he is in the hospital because "I'm sick I guess!" Patient reports he does not wish to discuss things any further. Provider explains to patient that he will have to talk a bit more and patient continues to refuse. Provider attempts to change to topic asks patient about his thoughts on if his heart were to stop and patient reports, "I don't want to talk about it!" Provider explains that his primary team needs to know if he understands that he has a decisions or not patient responds, "it is too hard to think now." Patient reports he wishes to rest and then reports that "the tall man" he spoke to "downtown 30 minutes ago" said that the provider needed to "go stand in traffic. " Patient denies HI as well as SI.   Past Psychiatric History: None known  Risk to Self:  Not at the moment Risk to Others:  No Prior Inpatient Therapy:  N/a Prior Outpatient Therapy:  No  Past Medical History:  Past Medical History:   Diagnosis Date   Acute heart failure with preserved ejection fraction (HCC)    Atrial fibrillation with rapid ventricular response (HCC) 07/11/2019    Past Surgical History:  Procedure Laterality Date   APPENDECTOMY     IR GUIDED DRAIN W CATHETER PLACEMENT  01/25/2021   IR NEPHROSTOMY EXCHANGE RIGHT  03/06/2021   IR NEPHROSTOMY PLACEMENT RIGHT  01/25/2021   Family History:  Family History  Problem Relation Age of Onset   Aneurysm Father    Family Psychiatric  History: Unknown Social History:  Social History   Substance and Sexual Activity  Alcohol Use Not Currently     Social History   Substance and Sexual Activity  Drug Use Never    Social History   Socioeconomic History   Marital status: Divorced    Spouse name: Not on file   Number of children: Not on file   Years of education: Not on file   Highest education level: Not on file  Occupational History   Not on file  Tobacco Use   Smoking status: Light Smoker    Pack years: 0.00   Smokeless tobacco: Never  Substance and Sexual Activity   Alcohol use: Not Currently   Drug use: Never   Sexual activity: Not Currently  Other Topics Concern   Not on file  Social History Narrative   Not on file   Social Determinants of Health   Financial Resource Strain: Not on file  Food Insecurity: No Food Insecurity   Worried About Running Out of Food  in the Last Year: Never true   Ran Out of Food in the Last Year: Never true  Transportation Needs: No Transportation Needs   Lack of Transportation (Medical): No   Lack of Transportation (Non-Medical): No  Physical Activity: Not on file  Stress: Not on file  Social Connections: Not on file   Additional Social History:    Allergies:  No Known Allergies  Labs:  Results for orders placed or performed during the hospital encounter of 03/01/21 (from the past 48 hour(s))  Basic metabolic panel     Status: Abnormal   Collection Time: 03/07/21  1:05 AM  Result Value Ref Range    Sodium 132 (L) 135 - 145 mmol/L   Potassium 3.3 (L) 3.5 - 5.1 mmol/L   Chloride 114 (H) 98 - 111 mmol/L   CO2 13 (L) 22 - 32 mmol/L   Glucose, Bld 108 (H) 70 - 99 mg/dL    Comment: Glucose reference range applies only to samples taken after fasting for at least 8 hours.   BUN 25 (H) 8 - 23 mg/dL   Creatinine, Ser 2.45 (H) 0.61 - 1.24 mg/dL   Calcium 9.3 8.9 - 80.9 mg/dL   GFR, Estimated 46 (L) >60 mL/min    Comment: (NOTE) Calculated using the CKD-EPI Creatinine Equation (2021)    Anion gap 5 5 - 15    Comment: Performed at [facility], [address] [place], [place] [address]  CBC with Differential/Platelet     Status: Abnormal   Collection Time: 03/07/21  1:05 AM  Result Value Ref Range   WBC 5.8 4.0 - 10.5 K/uL   RBC 3.32 (L) 4.22 - 5.81 MIL/uL   Hemoglobin 9.1 (L) 13.0 - 17.0 g/dL   HCT 25.0 (L) 53.9 - 76.7 %   MCV 82.8 80.0 - 100.0 fL   MCH 27.4 26.0 - 34.0 pg   MCHC 33.1 30.0 - 36.0 g/dL   RDW 34.1 (H) 93.7 - 90.2 %   Platelets 211 150 - 400 K/uL   nRBC 0.0 0.0 - 0.2 %   Neutrophils Relative % 76 %   Neutro Abs 4.4 1.7 - 7.7 K/uL   Lymphocytes Relative 12 %   Lymphs Abs 0.7 0.7 - 4.0 K/uL   Monocytes Relative 8 %   Monocytes Absolute 0.5 0.1 - 1.0 K/uL   Eosinophils Relative 2 %   Eosinophils Absolute 0.1 0.0 - 0.5 K/uL   Basophils Relative 1 %   Basophils Absolute 0.0 0.0 - 0.1 K/uL   Immature Granulocytes 1 %   Abs Immature Granulocytes 0.05 0.00 - 0.07 K/uL    Comment: Performed at [facility], [address] [place], [place] [address]  Urinalysis, Routine w reflex microscopic     Status: Abnormal   Collection Time: 03/07/21  5:05 PM  Result Value Ref Range   Color, Urine YELLOW YELLOW   APPearance HAZY (A) CLEAR   Specific Gravity, Urine 1.011 1.005 - 1.030   pH 6.0 5.0 - 8.0   Glucose, UA NEGATIVE NEGATIVE mg/dL   Hgb urine dipstick LARGE (A) NEGATIVE   Bilirubin Urine NEGATIVE NEGATIVE   Ketones, ur NEGATIVE NEGATIVE mg/dL   Protein,  ur 30 (A) NEGATIVE mg/dL   Nitrite NEGATIVE NEGATIVE   Leukocytes,Ua LARGE (A) NEGATIVE   RBC / HPF >50 (H) 0 - 5 RBC/hpf   WBC, UA >50 (H) 0 - 5 WBC/hpf   Bacteria, UA RARE (A) NONE SEEN   Squamous Epithelial /  LPF 0-5 0 - 5    Comment: Performed at [facility], [address] [place], [place] [address]  Basic metabolic panel     Status: Abnormal   Collection Time: 03/08/21  4:39 AM  Result Value Ref Range   Sodium 136 135 - 145 mmol/L   Potassium 3.0 (L) 3.5 - 5.1 mmol/L   Chloride 114 (H) 98 - 111 mmol/L   CO2 14 (L) 22 - 32 mmol/L   Glucose, Bld 88 70 - 99 mg/dL    Comment: Glucose reference range applies only to samples taken after fasting for at least 8 hours.   BUN 25 (H) 8 - 23 mg/dL   Creatinine, Ser 9.14 (H) 0.61 - 1.24 mg/dL   Calcium 9.3 8.9 - 78.2 mg/dL   GFR, Estimated 44 (L) >60 mL/min    Comment: (NOTE) Calculated using the CKD-EPI Creatinine Equation (2021)    Anion gap 8 5 - 15    Comment: Performed at [facility], [address] [place], [place] [address]  CBC with Differential/Platelet     Status: Abnormal   Collection Time: 03/08/21  4:39 AM  Result Value Ref Range   WBC 5.6 4.0 - 10.5 K/uL   RBC 3.30 (L) 4.22 - 5.81 MIL/uL   Hemoglobin 9.0 (L) 13.0 - 17.0 g/dL   HCT 30.8 (L) 65.7 - 84.6 %   MCV 82.7 80.0 - 100.0 fL   MCH 27.3 26.0 - 34.0 pg   MCHC 33.0 30.0 - 36.0 g/dL   RDW 96.2 (H) 95.2 - 84.1 %   Platelets 219 150 - 400 K/uL   nRBC 0.0 0.0 - 0.2 %   Neutrophils Relative % 68 %   Neutro Abs 3.8 1.7 - 7.7 K/uL   Lymphocytes Relative 17 %   Lymphs Abs 1.0 0.7 - 4.0 K/uL   Monocytes Relative 10 %   Monocytes Absolute 0.6 0.1 - 1.0 K/uL   Eosinophils Relative 3 %   Eosinophils Absolute 0.2 0.0 - 0.5 K/uL   Basophils Relative 1 %   Basophils Absolute 0.1 0.0 - 0.1 K/uL   Immature Granulocytes 1 %   Abs Immature Granulocytes 0.04 0.00 - 0.07 K/uL    Comment: Performed at [facility], [address] [place], [place] [address]   Magnesium     Status: None   Collection Time: 03/08/21  4:39 AM  Result Value Ref Range   Magnesium 1.7 1.7 - 2.4 mg/dL    Comment: Performed at [facility], [address] [place], [place] [address]  Glucose, capillary     Status: Abnormal   Collection Time: 03/08/21  8:53 AM  Result Value Ref Range   Glucose-Capillary 137 (H) 70 - 99 mg/dL    Comment: Glucose reference range applies only to samples taken after fasting for at least 8 hours.    Current Facility-Administered Medications  Medication Dose Route Frequency Provider Last Rate Last Admin   acetaminophen (TYLENOL) tablet 650 mg  650 mg Oral Q6H PRN Ephriam Knuckles, Rylee, MD   650 mg at 03/08/21 0038   Or   acetaminophen (TYLENOL) suppository 650 mg  650 mg Rectal Q6H PRN Christian, Rylee, MD       albuterol (PROVENTIL) (2.5 MG/3ML) 0.083% nebulizer solution 2.5 mg  2.5 mg Inhalation Q6H PRN Ephriam Knuckles, Rylee, MD       apixaban Everlene Balls) tablet 2.5 mg  2.5 mg Oral BID Albertha Ghee, MD   2.5 mg at  03/08/21 0817   doxycycline (VIBRA-TABS) tablet 100 mg  100 mg Oral Q12H Dellia Cloud, MD   100 mg at 03/08/21 3790   nicotine (NICODERM CQ - dosed in mg/24 hr) patch 7 mg  7 mg Transdermal Q0600 Christian, Rylee, MD   7 mg at 03/08/21 0551   potassium chloride SA (KLOR-CON) CR tablet 40 mEq  40 mEq Oral BID Albertha Ghee, MD   40 mEq at 03/08/21 0817   sodium bicarbonate 150 mEq in dextrose 5 % 1,150 mL infusion   Intravenous Continuous [name], MD 100 mL/hr at 03/08/21 2409 Started During Downtime at 03/08/21 7353   sodium bicarbonate tablet 650 mg  650 mg Oral Daily [name], MD   650 mg at 03/08/21 0817   umeclidinium bromide (INCRUSE ELLIPTA) 62.5 MCG/INH 1 puff  1 puff Inhalation Daily [name], MD   1 puff at 03/08/21 2992    Musculoskeletal: Strength & Muscle Tone: decreased Gait & Station:  remains in bed on exma Patient leans: N/A            Psychiatric Specialty  Exam:  Presentation  General Appearance: Appropriate for Environment  Eye Contact:Poor  Speech:Slurred  Speech Volume:Normal  Handedness: No data recorded  Mood and Affect  Mood:Irritable  Affect:Constricted   Thought Process  Thought Processes:Linear  Descriptions of Associations:Circumstantial  Orientation:Full (Time, Place and Person)  Thought Content:WDL  History of Schizophrenia/Schizoaffective disorder:No data recorded Duration of Psychotic Symptoms:No data recorded Hallucinations:Hallucinations: -- (Unsure maybe having AVH but could not clarify)  Ideas of Reference:Delusions  Suicidal Thoughts:Suicidal Thoughts: No  Homicidal Thoughts:Homicidal Thoughts: No   Sensorium  Memory:Immediate Poor; Recent Poor; Remote Poor  Judgment:Impaired  Insight:None   Executive Functions  Concentration:Poor  Attention Span:Poor  Recall:Other (comment) (2/5 presidents)  Fund of Knowledge:Poor  Language:Fair   Psychomotor Activity  Psychomotor Activity:Psychomotor Activity: Psychomotor Retardation   Assets  Assets:Resilience   Sleep  Sleep:Sleep: Fair   Physical Exam: Physical Exam HENT:     Head: Normocephalic and atraumatic.     Nose: Nose normal.  Eyes:     Extraocular Movements: Extraocular movements intact.     Conjunctiva/sclera: Conjunctivae normal.  Cardiovascular:     Rate and Rhythm: Normal rate.  Pulmonary:     Effort: Pulmonary effort is normal.     Breath sounds: Normal breath sounds.  Abdominal:     General: Abdomen is flat.  Skin:    General: Skin is warm and dry.  Neurological:     General: No focal deficit present.     Mental Status: He is alert.   Review of Systems  Constitutional:  Negative for chills and fever.  HENT:  Negative for hearing loss.   Eyes:  Negative for blurred vision.  Respiratory:  Negative for cough and wheezing.   Cardiovascular:  Negative for chest pain.  Gastrointestinal:  Negative for  abdominal pain.  Neurological:  Negative for dizziness.  Psychiatric/Behavioral:  Negative for suicidal ideas.   Blood pressure (!) 116/50, pulse 84, temperature (!) 97.5 F (36.4 C), temperature source Oral, resp. rate 20, weight 75.1 kg, SpO2 100 %. Body mass index is 21.84 kg/m.  Treatment Plan Summary: Plan per primary. Capacity Patient does not appear to have capacity to make his own medical decisions. Patient does not exhbit understanding of his current hospitalization nor as to why he is in the hospital or has been receiving treatment. Patient does not display the ability to reason or appreciate when options are given to  him and does not really seem aware that he has choice as he can not clarify what has been recommended to him by the primary team. Patient thought process is too linear and patient may also be a bit delirious based on some of his comments. Recommend that someone be able to assist patient with decisions regarding his medical care.   Disposition:  Per primary team. Will sign off. PGY-2 Bobbye Morton, MD 03/08/2021 2:24 PM

## 2021-03-09 DIAGNOSIS — N200 Calculus of kidney: Secondary | ICD-10-CM | POA: Diagnosis not present

## 2021-03-09 DIAGNOSIS — N390 Urinary tract infection, site not specified: Secondary | ICD-10-CM | POA: Diagnosis not present

## 2021-03-09 DIAGNOSIS — Z789 Other specified health status: Secondary | ICD-10-CM | POA: Diagnosis not present

## 2021-03-09 DIAGNOSIS — A419 Sepsis, unspecified organism: Secondary | ICD-10-CM | POA: Diagnosis not present

## 2021-03-09 LAB — CBC WITH DIFFERENTIAL/PLATELET
Abs Immature Granulocytes: 0.04 10*3/uL (ref 0.00–0.07)
Basophils Absolute: 0.1 10*3/uL (ref 0.0–0.1)
Basophils Relative: 1 %
Eosinophils Absolute: 0.2 10*3/uL (ref 0.0–0.5)
Eosinophils Relative: 3 %
HCT: 24.5 % — ABNORMAL LOW (ref 39.0–52.0)
Hemoglobin: 8.3 g/dL — ABNORMAL LOW (ref 13.0–17.0)
Immature Granulocytes: 1 %
Lymphocytes Relative: 21 %
Lymphs Abs: 1.2 10*3/uL (ref 0.7–4.0)
MCH: 27.6 pg (ref 26.0–34.0)
MCHC: 33.9 g/dL (ref 30.0–36.0)
MCV: 81.4 fL (ref 80.0–100.0)
Monocytes Absolute: 0.6 10*3/uL (ref 0.1–1.0)
Monocytes Relative: 11 %
Neutro Abs: 3.7 10*3/uL (ref 1.7–7.7)
Neutrophils Relative %: 63 %
Platelets: 219 10*3/uL (ref 150–400)
RBC: 3.01 MIL/uL — ABNORMAL LOW (ref 4.22–5.81)
RDW: 21.1 % — ABNORMAL HIGH (ref 11.5–15.5)
WBC: 5.8 10*3/uL (ref 4.0–10.5)
nRBC: 0 % (ref 0.0–0.2)

## 2021-03-09 LAB — BASIC METABOLIC PANEL
Anion gap: 6 (ref 5–15)
BUN: 27 mg/dL — ABNORMAL HIGH (ref 8–23)
CO2: 22 mmol/L (ref 22–32)
Calcium: 8.8 mg/dL — ABNORMAL LOW (ref 8.9–10.3)
Chloride: 105 mmol/L (ref 98–111)
Creatinine, Ser: 1.53 mg/dL — ABNORMAL HIGH (ref 0.61–1.24)
GFR, Estimated: 45 mL/min — ABNORMAL LOW (ref >60)
Glucose, Bld: 117 mg/dL — ABNORMAL HIGH (ref 70–99)
Potassium: 3.3 mmol/L — ABNORMAL LOW (ref 3.5–5.1)
Sodium: 133 mmol/L — ABNORMAL LOW (ref 135–145)

## 2021-03-09 LAB — MAGNESIUM: Magnesium: 1.5 mg/dL — ABNORMAL LOW (ref 1.7–2.4)

## 2021-03-09 MED ORDER — LINEZOLID 600 MG PO TABS
600.0000 mg | ORAL_TABLET | Freq: Two times a day (BID) | ORAL | Status: DC
  Administered 2021-03-09 – 2021-03-10 (×2): 600 mg via ORAL
  Filled 2021-03-09 (×3): qty 1

## 2021-03-09 MED ORDER — LACTATED RINGERS IV SOLN: INTRAVENOUS | Status: DC

## 2021-03-09 MED ORDER — POLYETHYLENE GLYCOL 3350 17 G PO PACK
17.0000 g | PACK | Freq: Every day | ORAL | Status: DC
  Administered 2021-03-09 – 2021-03-17 (×7): 17 g via ORAL
  Filled 2021-03-09 (×8): qty 1

## 2021-03-09 MED ORDER — POTASSIUM CHLORIDE CRYS ER 20 MEQ PO TBCR
40.0000 meq | EXTENDED_RELEASE_TABLET | Freq: Two times a day (BID) | ORAL | Status: AC
  Administered 2021-03-09 – 2021-03-10 (×2): 40 meq via ORAL
  Filled 2021-03-09 (×2): qty 2

## 2021-03-09 MED ORDER — BISACODYL 5 MG PO TBEC
5.0000 mg | DELAYED_RELEASE_TABLET | Freq: Every day | ORAL | Status: AC
  Administered 2021-03-09 – 2021-03-10 (×2): 5 mg via ORAL
  Filled 2021-03-09 (×2): qty 1

## 2021-03-09 MED ORDER — MAGNESIUM SULFATE 4 GM/100ML IV SOLN
4.0000 g | Freq: Once | INTRAVENOUS | Status: AC
  Administered 2021-03-09: 4 g via INTRAVENOUS
  Filled 2021-03-09: qty 100

## 2021-03-09 MED ORDER — SODIUM BICARBONATE 650 MG PO TABS
650.0000 mg | ORAL_TABLET | Freq: Two times a day (BID) | ORAL | Status: DC
  Administered 2021-03-09 – 2021-03-17 (×18): 650 mg via ORAL
  Filled 2021-03-09 (×18): qty 1

## 2021-03-09 MED ORDER — LACTATED RINGERS IV SOLN: INTRAVENOUS | Status: AC

## 2021-03-09 NOTE — Progress Notes (Signed)
Occupational Therapy Treatment Patient Details Name: Alfred Mercado MRN: [id number] DOB: 1936/05/18 Today's Date: 03/09/2021    History of present illness 85 year old male with a past medical history of HFpEF, persistent atrial fibrillation, COPD, CKD IIIb, and multiple recent admissions for complicated UTI with bilateral pyelonephritis requiring nephrostomy tubes. Admitted through ED 03/01/21 for dizziness and AMS related to sepsis started on IV cefazolin for UTI and nephrostomy site cellulitis.   OT comments  Pt received in recliner, requesting to return to bed. Pt appeared upset and agitated about duration he was sitting in the recliner and stated "not a single person has been truthful to be". Pt was agreeable to OT/PT session. He required minA+2 to stand from recliner, modA+2 with hand held assistance for stand-pivot return to EOB. Pt with posterior bias. RR up to 45 and HR 136bpm with stand-pivot transfer returned to normal limits once supine in bed. Provided pt with ensure drink as he declined to eat his lunch stating "I just don't like it".  Pt will continue to benefit from skilled OT services to maximize safety and independence with ADL/IADL and functional mobility. Will continue to follow acutely and progress as tolerated.    Follow Up Recommendations  SNF    Equipment Recommendations  Other (comment) (defer to next venue)    Recommendations for Other Services      Precautions / Restrictions Precautions Precautions: Fall Precaution Comments: pt reports "a few" falls recently Spinal Brace Comments: has TLSO in room that was to be used for comfort with OOB, however now he has 2 drains coming out of his back making it painful and difficulty to place brace Restrictions Weight Bearing Restrictions: No Other Position/Activity Restrictions: pt had a TLSO prescribed for comfort after his L1 burst fx, but does not use       Mobility Bed Mobility Overal bed mobility: Needs  Assistance Bed Mobility: Sit to Supine       Sit to supine: Mod assist;+2 for physical assistance   General bed mobility comments: for management of trunk and BLEs    Transfers Overall transfer level: Needs assistance Equipment used: 2 person hand held assist Transfers: Sit to/from Stand Sit to Stand: Min assist;+2 physical assistance Stand pivot transfers: Mod assist;+2 physical assistance       General transfer comment: MinAx2 to boost to full upright position and gain balance with posterior lean, then ModAx2 to pivot over to bed safely    Balance Overall balance assessment: Needs assistance Sitting-balance support: Feet unsupported;Bilateral upper extremity supported;No upper extremity supported;Single extremity supported Sitting balance-[name]: Fair Sitting balance - Comments: close min guard at EOB   Standing balance support: Bilateral upper extremity supported;During functional activity Standing balance-[name]: Poor Standing balance comment: reliant on external support for balance                           ADL either performed or assessed with clinical judgement   ADL Overall ADL's : Needs assistance/impaired Eating/Feeding: Set up;Sitting Eating/Feeding Details (indicate cue type and reason): provided pt with ensure drink. Pt with full meal on tray, pt reported he did not want to eat because he did not like the food Grooming: Set up;Sitting           Upper Body Dressing : Min guard;Sitting   Lower Body Dressing: Minimal assistance;Sit to/from stand Lower Body Dressing Details (indicate cue type and reason): assist to access biletaral feet Toilet Transfer: Moderate assistance;+2 for  physical assistance;+2 for safety/equipment;Stand-pivot Toilet Transfer Details (indicate cue type and reason): simulated return to bed from recliner Toileting- Clothing Manipulation and Hygiene: Moderate assistance;Sit to/from stand;Cueing for safety;Cueing for  sequencing       Functional mobility during ADLs: Moderate assistance;+2 for physical assistance;+2 for safety/equipment;Cueing for safety;Cueing for sequencing;Rolling walker General ADL Comments: pt limited by instability, decreased activity tolerance and generalized deconditioning     Vision       Perception     Praxis      Cognition Arousal/Alertness: Awake/alert Behavior During Therapy: Agitated Overall Cognitive Status: Impaired/Different from baseline Area of Impairment: Attention;Memory;Following commands;Safety/judgement;Awareness;Problem solving                   Current Attention Level: Sustained Memory: Decreased short-term memory Following Commands: Follows one step commands with increased time Safety/Judgement: Decreased awareness of safety;Decreased awareness of deficits Awareness: Intellectual Problem Solving: Slow processing;Difficulty sequencing;Requires verbal cues;Requires tactile cues;Decreased initiation General Comments: agitated and stating "there's not been a single person here who's been truthful to me this whole time", needed cues for safety and sequencing, very poor awarness of safety and deficits in general        Exercises     Shoulder Instructions       General Comments RR at max 45, HR up to 136bpm, BP 100/70s at EOB    Pertinent Vitals/ Pain       Pain Assessment: Faces Faces Pain Scale: Hurts little more Pain Location: generalized discomfort Pain Descriptors / Indicators: Discomfort;Grimacing Pain Intervention(s): Limited activity within patient's tolerance;Monitored during session  Home Living                                          Prior Functioning/Environment              Frequency  Min 2X/week        Progress Toward Goals  OT Goals(current goals can now be found in the care plan section)  Progress towards OT goals: Progressing toward goals  Acute Rehab OT Goals Patient Stated Goal:  get banking straightened out OT Goal Formulation: With patient Time For Goal Achievement: 03/17/21 Potential to Achieve Goals: Fair ADL Goals Pt Will Perform Grooming: with supervision;sitting Pt Will Perform Upper Body Bathing: with min assist;sitting Pt Will Perform Lower Body Bathing: with supervision;sit to/from stand Pt Will Perform Upper Body Dressing: sitting;with supervision Pt Will Perform Lower Body Dressing: with supervision;sit to/from stand Pt Will Transfer to Toilet: with mod assist;stand pivot transfer;bedside commode Pt Will Perform Toileting - Clothing Manipulation and hygiene: with min assist;sit to/from stand Additional ADL Goal #1: Pt will perform bed mobility with moderate assistance in preparation for ADL.  Plan Discharge plan remains appropriate    Co-evaluation    PT/OT/SLP Co-Evaluation/Treatment: Yes Reason for Co-Treatment: Complexity of the patient's impairments (multi-system involvement);For patient/therapist safety;To address functional/ADL transfers   OT goals addressed during session: ADL's and self-care      AM-PAC OT "6 Clicks" Daily Activity     Outcome Measure   Help from another person eating meals?: A Little Help from another person taking care of personal grooming?: A Lot Help from another person toileting, which includes using toliet, bedpan, or urinal?: A Lot Help from another person bathing (including washing, rinsing, drying)?: A Lot Help from another person to put on and taking off regular upper body clothing?: A Little Help  from another person to put on and taking off regular lower body clothing?: A Little 6 Click Score: 15    End of Session Equipment Utilized During Treatment: Gait belt  OT Visit Diagnosis: Unsteadiness on feet (R26.81);Other abnormalities of gait and mobility (R26.89);Muscle weakness (generalized) (M62.81);History of falling (Z91.81);Pain;Other symptoms and signs involving cognitive function Pain - part of body:   (generalized)   Activity Tolerance Patient tolerated treatment well   Patient Left in bed;with call bell/phone within reach;with bed alarm set   Nurse Communication Mobility status        Time: 3403-7096 OT Time Calculation (min): 14 min  Charges: OT General Charges $OT Visit: 1 Visit OT Treatments $Self Care/Home Management : 8-22 mins  Rosey Bath OTR/L Acute Rehabilitation Services Office: [phone]    Rebeca Alert 03/09/2021, 1:41 PM

## 2021-03-09 NOTE — Progress Notes (Signed)
This chaplain attempted F/U spiritual care. The Pt. is sleeping.

## 2021-03-09 NOTE — Progress Notes (Signed)
Patient chart reviewed and patient is poor surgical candidate for PCNL. Recommend patient continue PCN tube which will need to be changed by interventional radiology every 3 months.   Full Urology consult to follow tomorrow 7/15.

## 2021-03-09 NOTE — Progress Notes (Signed)
Team feels like the infectious issue is more related to UTI rather than cellulitis. Since doxy can't get into the urine, ok to change doxy back to linezolid with the same stop date per Dr. Barbaraann Faster.  Ulyses Southward, PharmD, BCIDP, AAHIVP, CPP Infectious Disease Pharmacist 03/09/2021 4:09 PM

## 2021-03-09 NOTE — Progress Notes (Signed)
This chaplain is present at the Pt. bedside for F/U spiritual care. The Pt. appreciates the chaplain's visit.    The chaplain learns through the context of the Pt. day he prefers not to be "told" what to do. For example, the Pt. remembers "enjoying the time in the recliner", beside the window, but did not want to be told when to sit down and when to get up.  The chaplain understands a similar theme of independence is reappearing as the Pt. talks about his motel family and his perception of the interactions with the hospital staff.   The Pt. Is looking forward to a visit from [employer] today. The chaplain understands the pastor is part of his community and the Pt. is looking forward to talking to the pastor.  This chaplain understands the Pt. Is requesting F/U spiritual care.

## 2021-03-09 NOTE — Progress Notes (Signed)
Physical Therapy Treatment Patient Details Name: Alfred Mercado MRN: [id number] DOB: September 22, 1935 Today's Date: 03/09/2021    History of Present Illness 85 year old male with a past medical history of HFpEF, persistent atrial fibrillation, COPD, CKD IIIb, and multiple recent admissions for complicated UTI with bilateral pyelonephritis requiring nephrostomy tubes. Admitted through ED 03/01/21 for dizziness and AMS related to sepsis started on IV cefazolin for UTI and nephrostomy site cellulitis.    PT Comments    Patient received in recliner, agitated about being up and upset stating "not a single person here has been truthful to me". Able to be redirected to focusing on transfer back to bed, able to complete with Min-ModAx2 with HHA and refused to use RW. Has strong posterior lean that he has very poor awareness about. Very deconditioned- RR to 45 and HR to 136 just with transfer activity. Left in bed with all needs met, bed alarm active and vitals back to baseline. Will continue to follow.    Follow Up Recommendations  SNF;Supervision for mobility/OOB     Equipment Recommendations  None recommended by PT    Recommendations for Other Services       Precautions / Restrictions Precautions Precautions: Fall Precaution Comments: pt reports "a few" falls recently Spinal Brace Comments: has TLSO in room that was to be used for comfort with OOB, however now he has 2 drains coming out of his back making it painful and difficulty to place brace Restrictions Weight Bearing Restrictions: No Other Position/Activity Restrictions: pt had a TLSO prescribed for comfort after his L1 burst fx, but does not use    Mobility  Bed Mobility Overal bed mobility: Needs Assistance Bed Mobility: Sit to Supine       Sit to supine: Mod assist;+2 for physical assistance   General bed mobility comments: for management of trunk and BLEs    Transfers Overall transfer level: Needs assistance Equipment  used: 2 person hand held assist Transfers: Sit to/from Stand Sit to Stand: Min assist;+2 physical assistance Stand pivot transfers: Mod assist;+2 physical assistance       General transfer comment: MinAx2 to boost to full upright position and gain balance with posterior lean, then ModAx2 to pivot over to bed safely  Ambulation/Gait             General Gait Details: deferred/refused   Stairs             Wheelchair Mobility    Modified Rankin (Stroke Patients Only)       Balance Overall balance assessment: Needs assistance   Sitting balance-[name]: Fair Sitting balance - Comments: close min guard at EOB   Standing balance support: Bilateral upper extremity supported;During functional activity Standing balance-[name]: Poor Standing balance comment: reliant on external support for balance                            Cognition Arousal/Alertness: Awake/alert Behavior During Therapy: Agitated Overall Cognitive Status: Impaired/Different from baseline                     Current Attention Level: Sustained Memory: Decreased short-term memory Following Commands: Follows one step commands with increased time Safety/Judgement: Decreased awareness of safety;Decreased awareness of deficits Awareness: Intellectual Problem Solving: Slow processing;Difficulty sequencing;Requires verbal cues;Requires tactile cues;Decreased initiation General Comments: agitated and stating "there's not been a single person here who's been truthful to me this whole time", needed cues for safety and sequencing, very  poor awarness of safety and deficits in general      Exercises      General Comments General comments (skin integrity, edema, etc.): RR to 45 at max, HR to 136 at max, BP 100/70s once at EOB      Pertinent Vitals/Pain Pain Assessment: Faces Faces Pain Scale: Hurts little more Pain Location: generalized discomfort Pain Descriptors / Indicators:  Discomfort;Grimacing Pain Intervention(s): Limited activity within patient's tolerance;Monitored during session    Home Living                      Prior Function            PT Goals (current goals can now be found in the care plan section) Acute Rehab PT Goals Patient Stated Goal: get banking straightened out PT Goal Formulation: With patient Time For Goal Achievement: 03/17/21 Potential to Achieve Goals: Fair Progress towards PT goals: Progressing toward goals    Frequency    Min 3X/week      PT Plan Current plan remains appropriate    Co-evaluation              AM-PAC PT "6 Clicks" Mobility   Outcome Measure  Help needed turning from your back to your side while in a flat bed without using bedrails?: A Lot Help needed moving from lying on your back to sitting on the side of a flat bed without using bedrails?: A Lot Help needed moving to and from a bed to a chair (including a wheelchair)?: Total Help needed standing up from a chair using your arms (e.g., wheelchair or bedside chair)?: Total Help needed to walk in hospital room?: Total Help needed climbing 3-5 steps with a railing? : Total 6 Click Score: 8    End of Session Equipment Utilized During Treatment: Gait belt Activity Tolerance: Patient tolerated treatment well Patient left: in bed;with call bell/phone within reach;with bed alarm set Nurse Communication: Mobility status PT Visit Diagnosis: Unsteadiness on feet (R26.81);Muscle weakness (generalized) (M62.81);Difficulty in walking, not elsewhere classified (R26.2);History of falling (Z91.81);Repeated falls (R29.6);Other abnormalities of gait and mobility (R26.89);Pain Pain - Right/Left: Right Pain - part of body:  (flank)     Time: 0175-1025 PT Time Calculation (min) (ACUTE ONLY): 14 min  Charges:                       Windell Norfolk, DPT, PN1   Supplemental Physical Therapist [place]    Pager [phone] Acute Rehab Office  [phone]

## 2021-03-09 NOTE — Progress Notes (Signed)
    Subjective:   Overnight Events: No acute overnight events  Patient sitting up in chair on approach.  He overall says he feels weak and had some episodic lightheadedness.  Objective:  Vital signs in last 24 hours: Vitals:   03/08/21 2049 03/08/21 2314 03/09/21 0351 03/09/21 0550  BP: 110/65 (!) 107/54 111/68   Pulse: 97 94 84   Resp: 20 18 16    Temp: 97.7 F (36.5 C) 98 F (36.7 C) (!) 97.5 F (36.4 C)   TempSrc: Oral Oral Oral   SpO2: 99% 98% 99%   Weight:    72.8 kg   Supplemental O2: Room Air SpO2: 99 % O2 Flow Rate (L/min): 2 L/min  Physical Exam:   General: Elderly , frail, sitting in chair Cardiovascular: Irregular regular rhythm, normal rate no murmurs Pulmonary : Normal work of breathing, CTAB Abdominal: soft, nontender,  bowel sounds present Back: dressing change last night, serous fluid, no purulence noticed.  Cellulitis appears less erythematous, extremely tender to light touch when removing dressing  Antibiotics Cefazolin 7/6-7/7 Cefepime 7/7 -> 7/12 Vancomycin 7/8 -> 7/12 Linezolid 7/11->7/12 Doxycycline 7/12->7/14 Linezolid 7/14 ->  Culture data Blood cultures NGTD x5 days 7/6 urine cultures: Staph epidermis, 40 K colonies, susceptibilities in chart 7/12 urine cultures: Pending  R.Nephrostomy tube exchange date : 03/06/2021  Assessment/Plan:   Principal Problem:   Sepsis (HCC) Active Problems:   Chronic heart failure with preserved ejection fraction (HCC)   Acute-on-chronic kidney injury (HCC)   Complicated UTI (urinary tract infection)   Staghorn renal calculus   Cellulitis   Hyponatremia   Somnolence   Patient Summary: Hospital day 7 for Alfred Mercado a 85 y.o. person living with HFpEF, persistent atrial fibrillation, COPD, CKD 3B , recent emphysematous pyelitis and left perinephric abscess s/p nephrostomy tube ( removed before discharge 6/19) , and right staghorn calculus with right percutaneous nephrostomy tube admitted with  sepsis.   #Complicated UTI, Staph epidermis #Purulent Cellulitis from R. Nephrostomy tube insertion site - greatly appreciate urology consulting, formal consult tomorrow.  - urine culture ordered 7/12 collected but not resulted. Confirmed with lab, received urine today, expect update to results tomorrow.  -Switch doxycycline to linezolid to cover for urinary tract infection and purulent cellulitis - Some episodic lightheadedness in setting of poor p.o. intake. Will give some LR to see if this helps with symptoms.   #Capacity Psychiatry formally evaluated patient lacks capacity to make informed decisions.  Caseworker has started emergent guardianship process, see TOC note from today for details.   #Atrial fibrillation - patient in A. fib with normal ventricular rate,continue Eliquis  #Normocytic Anemia Hx of IDA, iron 33, tibc 277, ferritin 63 on 6/1. Received iron transfusion on last admission when these labs were collected.  -Hemoglobin 8.3, down from 9 yesterday . No signs of blood loss.   Diet: Regular VTE: Eliquis  Code: Full PT/OT recs: SNF  Dispo: Anticipated discharge to Skilled nursing facility pending improvement in medical condition.   8/1, MD PGY3 Internal Medicine Pager: [phone] Please contact the on call pager after 5 pm and on weekends at [phone].

## 2021-03-09 NOTE — TOC Progression Note (Addendum)
Transition of Care [facility]) - Progression Note    Patient Details  Name: Alfred Mercado MRN: [id number] Date of Birth: 1936/01/26  Transition of Care [facility]) CM/SW [place], LCSW Phone Number: 03/09/2021, 9:50 AM  Clinical Narrative:    9:50am-Per psychiatry, patient lacks the mental capacity to make an informed decision. CSW completed APS report to request assistance locating a decision-maker for patient versus having DSS pursue Guardianship.   12:15pm-CSW received call from [place], [place] [phone]). She stated they have accepted the case and are on the way to assess patient now. If they decide to pursue emergency Guardianship, of note, they are not able to make life or death decisions until full Guardianship is granted. If any situations arise where those decisions need to be made, two physicians would need to agree or consult ethics per hospital protocol. Per Ms. Jimmye Norman, she spoke with patient's friend Dorna Bloom but she only recently met the patient and is not responsible for him.   Expected Discharge Plan: [place] Barriers to Discharge: Continued Medical Work up  Expected Discharge Plan and Services Expected Discharge Plan: [place] In-house Referral: Clinical Social Work, Chaplain     Living arrangements for the past 2 months: Hotel/Motel                                       Social Determinants of Health (SDOH) Interventions    Readmission Risk Interventions No flowsheet data found.

## 2021-03-10 DIAGNOSIS — N179 Acute kidney failure, unspecified: Secondary | ICD-10-CM | POA: Diagnosis not present

## 2021-03-10 DIAGNOSIS — A419 Sepsis, unspecified organism: Secondary | ICD-10-CM | POA: Diagnosis not present

## 2021-03-10 DIAGNOSIS — R6520 Severe sepsis without septic shock: Secondary | ICD-10-CM | POA: Diagnosis not present

## 2021-03-10 DIAGNOSIS — N390 Urinary tract infection, site not specified: Secondary | ICD-10-CM | POA: Diagnosis not present

## 2021-03-10 LAB — CBC WITH DIFFERENTIAL/PLATELET
Abs Immature Granulocytes: 0.04 10*3/uL (ref 0.00–0.07)
Basophils Absolute: 0.1 10*3/uL (ref 0.0–0.1)
Basophils Relative: 1 %
Eosinophils Absolute: 0.2 10*3/uL (ref 0.0–0.5)
Eosinophils Relative: 4 %
HCT: 26.4 % — ABNORMAL LOW (ref 39.0–52.0)
Hemoglobin: 8.9 g/dL — ABNORMAL LOW (ref 13.0–17.0)
Immature Granulocytes: 1 %
Lymphocytes Relative: 18 %
Lymphs Abs: 1 10*3/uL (ref 0.7–4.0)
MCH: 28.1 pg (ref 26.0–34.0)
MCHC: 33.7 g/dL (ref 30.0–36.0)
MCV: 83.3 fL (ref 80.0–100.0)
Monocytes Absolute: 0.5 10*3/uL (ref 0.1–1.0)
Monocytes Relative: 9 %
Neutro Abs: 3.7 10*3/uL (ref 1.7–7.7)
Neutrophils Relative %: 67 %
Platelets: 214 10*3/uL (ref 150–400)
RBC: 3.17 MIL/uL — ABNORMAL LOW (ref 4.22–5.81)
RDW: 21.3 % — ABNORMAL HIGH (ref 11.5–15.5)
WBC: 5.5 10*3/uL (ref 4.0–10.5)
nRBC: 0 % (ref 0.0–0.2)

## 2021-03-10 LAB — MAGNESIUM: Magnesium: 2.3 mg/dL (ref 1.7–2.4)

## 2021-03-10 LAB — URINE CULTURE: Culture: NO GROWTH

## 2021-03-10 LAB — BASIC METABOLIC PANEL
Anion gap: 7 (ref 5–15)
BUN: 22 mg/dL (ref 8–23)
CO2: 21 mmol/L — ABNORMAL LOW (ref 22–32)
Calcium: 9 mg/dL (ref 8.9–10.3)
Chloride: 107 mmol/L (ref 98–111)
Creatinine, Ser: 1.34 mg/dL — ABNORMAL HIGH (ref 0.61–1.24)
GFR, Estimated: 53 mL/min — ABNORMAL LOW (ref >60)
Glucose, Bld: 98 mg/dL (ref 70–99)
Potassium: 3.5 mmol/L (ref 3.5–5.1)
Sodium: 135 mmol/L (ref 135–145)

## 2021-03-10 LAB — GLUCOSE, CAPILLARY: Glucose-Capillary: 124 mg/dL — ABNORMAL HIGH (ref 70–99)

## 2021-03-10 MED ORDER — ENSURE ENLIVE PO LIQD
237.0000 mL | Freq: Three times a day (TID) | ORAL | Status: DC
  Administered 2021-03-10 – 2021-03-17 (×20): 237 mL via ORAL

## 2021-03-10 MED ORDER — POTASSIUM CHLORIDE CRYS ER 20 MEQ PO TBCR
40.0000 meq | EXTENDED_RELEASE_TABLET | Freq: Once | ORAL | Status: AC
  Administered 2021-03-10: 40 meq via ORAL
  Filled 2021-03-10: qty 2

## 2021-03-10 NOTE — Progress Notes (Signed)
Initial Nutrition Assessment  DOCUMENTATION CODES:   Non-severe (moderate) malnutrition in context of chronic illness  INTERVENTION:  Provide Ensure Enlive po TID, each supplement provides 350 kcal and 20 grams of protein.  Encourage adequate PO intake.   NUTRITION DIAGNOSIS:   Moderate Malnutrition related to chronic illness (HF, COPD) as evidenced by moderate fat depletion, moderate muscle depletion.  GOAL:   Patient will meet greater than or equal to 90% of their needs  MONITOR:   PO intake, Supplement acceptance, Skin, Weight trends, Labs, I & O's  REASON FOR ASSESSMENT:   Consult Assessment of nutrition requirement/status  ASSESSMENT:   85 y.o. person living with HFpEF, persistent atrial fibrillation, COPD, CKD 3B , recent emphysematous pyelitis and left perinephric abscess s/p nephrostomy tube (removed before discharge 6/19) , and right staghorn calculus with right percutaneous nephrostomy tube admitted with sepsis. 7/11 R nephrostomy tube exchange  Pt asleep during time of visit. Meal completion 25-60%. Per weight records, pt with a 9% weight loss over the past 1 month. Pt with history of HF, weight may be affected by fluid status. RD to order nutritional supplements to aid in caloric and protein needs.   NUTRITION - FOCUSED PHYSICAL EXAM:  Flowsheet Row Most Recent Value  Orbital Region Unable to assess  Upper Arm Region Moderate depletion  Thoracic and Lumbar Region Unable to assess  Buccal Region Unable to assess  Temple Region Unable to assess  Clavicle Bone Region Moderate depletion  Clavicle and Acromion Bone Region Moderate depletion  Scapular Bone Region Unable to assess  Dorsal Hand Unable to assess  Patellar Region Moderate depletion  Anterior Thigh Region Moderate depletion  Posterior Calf Region Moderate depletion  Edema (RD Assessment) None  Hair Reviewed  Eyes Reviewed  Mouth Reviewed  Skin Reviewed  Nails Reviewed      Labs and  medications reviewed.   Diet Order:   Diet Order             Diet regular Room service appropriate? Yes; Fluid consistency: Thin  Diet effective now                   EDUCATION NEEDS:   Not appropriate for education at this time  Skin:  Skin Assessment: Reviewed RN Assessment  Last BM:  unknown  Height:   Ht Readings from Last 1 Encounters:  03/01/21 6\' 1"  (1.854 m)    Weight:   Wt Readings from Last 1 Encounters:  03/10/21 73.9 kg    Ideal Body Weight:  83.6 kg  BMI:  Body mass index is 21.49 kg/m.  Estimated Nutritional Needs:   Kcal:  1900-2050  Protein:  90-105 grams  Fluid:  >/= 1.9 L/day  03/12/21, MS, RD, LDN RD pager number/after hours weekend pager number on Amion.

## 2021-03-10 NOTE — Progress Notes (Addendum)
    Subjective:   Overnight Events: none  Patient resting on approach. He wakes up and has no acute concerns. We discussed plans for urology to consult today. Encourage patient to spend day in the chair.  Abdominal pain improved overnight, but he has not had a bowel movent. No other acute concerns  Objective:  Vital signs in last 24 hours: Vitals:   03/09/21 2106 03/09/21 2300 03/10/21 0500 03/10/21 0524  BP: 113/68 119/71  126/80  Pulse: 82 84  80  Resp: (!) 23 19  20   Temp: 97.7 F (36.5 C) 97.7 F (36.5 C)  98.2 F (36.8 C)  TempSrc: Oral Oral  Oral  SpO2: 98% 98%  98%  Weight:   73.9 kg    Supplemental O2: Room Air SpO2: 98 % O2 Flow Rate (L/min): 2 L/min  Physical Exam:    General: Elderly man, frail, sleeping on approach  HEENT: Moist mucous membranes, pale conjunctiva Cardiovascular: Irregular irregular pulse, normal rate ,no murmurs, rubs, or gallops Pulmonary : Effort normal, breath sounds normal. No wheezes, rales, or rhonchi Abdominal: soft, nontender,  bowel sounds present  Antibiotics Cefazolin 7/6-7/7 Cefepime 7/7 -> 7/12 Vancomycin 7/8 -> 7/12 Linezolid 7/11->7/12 Doxycycline 7/12->7/14 Linezolid 7/14 ->  Culture data Blood cultures NGTD x5 days 7/6 urine cultures: Staph epidermis, 40 K colonies, susceptibilities in chart 7/12 urine cultures: Pending  R.Nephrostomy tube exchange date : 03/06/2021  Assessment/Plan:   Principal Problem:   Sepsis (HCC) Active Problems:   Chronic heart failure with preserved ejection fraction (HCC)   Acute-on-chronic kidney injury (HCC)   Complicated UTI (urinary tract infection)   Staghorn renal calculus   Cellulitis   Hyponatremia   Somnolence   Patient incapable of making informed decisions   Patient Summary: Hospital day 7 for Alfred Mercado a 85 y.o. person living with HFpEF, persistent atrial fibrillation, COPD, CKD 3B , recent emphysematous pyelitis and left perinephric abscess s/p nephrostomy  tube ( removed before discharge 6/19) , and right staghorn calculus with right percutaneous nephrostomy tube admitted with sepsis.   #Complicated UTI, Staph epidermis #Purulent Cellulitis from R. Nephrostomy tube insertion site  - Appears stable , BP has improved.  -No growth on urine culture, will continue to follow. After urology consult and based on repeat urine culture we will make a decision on antibiotics.  - Urology to consult formally , but likely patient will need IR to exchange PCN tube every 3 months. - Continue Linezolid to cover urinary tract infection and purulent cellulitis, will make a decision about antibiotic stop date after following repeat urine culture and formal urology consult.  #Capacity Psychiatry formally evaluated patient and patient lacks capacity to make informed decisions.  Emergent guardianship process underway.   #Atrial fibrillation Patient in A. fib with normal ventricular rate -Continue Eliquis 2.5 twice daily  #Normocytic Anemia Hx of IDA, iron 33, tibc 277, ferritin 63 on 6/1. Received iron transfusion on last admission when these labs were collected.  Hemoglobin stable, 8.9  Diet: Regular VTE: Eliquis  Code: Full PT/OT recs: SNF  Dispo: Anticipated discharge to Skilled nursing facility pending improvement in medical condition.   8/1, MD PGY3 Internal Medicine Pager: [phone] Please contact the on call pager after 5 pm and on weekends at [phone].

## 2021-03-10 NOTE — Consult Note (Signed)
   [facility] Inpatient Consult   03/10/2021  [name] [birth year] [id number]  [facility] [[facility]]  Accountable Care Organization [ACO] Patient: Alfred Anon, MD is the Primary Care Provider:  [facility]  Patient screened for less than 30 days readmission and length of stay hospitalization with noted high risk score for unplanned readmission risk and  to assess for potential [facility]  [[facility]] Care Management service needs for post hospital transition.  Review of patient's medical record reveals patient isbeing recommended for a skilled nursing facility level of care. Patient resides at [facility] for [facility] years in room 204 noted.  Reviewed PT/OT recommendations, chaplain note, and palliative consult note.   Plan:  Continue to follow progress and disposition to assess for post hospital care management needs.  If patient transitions to a [facility] affiliated SNF, will then alert the [facility] RN PAC of disposition and TOC needs from a facility.  For questions contact:   Charlesetta Shanks, RN BSN CCM [facility]  [phone] business mobile phone Toll free office [phone]  Fax number: [phone] [email or web address]

## 2021-03-10 NOTE — Consult Note (Signed)
I have been asked to see the patient by Dr. [name] for evaluation and management of chronic right staghorn renal calculus associated with emphysematous pyelitis.  History of present illness: 85 year old man with a history of A. fib with RVR on anticoagulation, CHF, pleural effusion  s/p thoracentesis x2 in the past anemia who recently presented to the hospital following ground-level fall found to have a left renal abscess, right staghorn calculus with emphysematous pyelitis, and acute urinary retention with UTI with sepsis.  During that hospitalization he underwent percutaneous drainage of the left renal abscess as well as right nephrostomy tube by interventional radiology.  He had a Foley catheter placed which has since been removed.  He is now readmitted to the hospital on 03/01/2021 with dizziness and hypotension.  He also had cellulitis noted at nephrostomy tube because he was unable to have home health care for it due to living in a motel.  He had previously refused SNF placement at discharge on 02/12/2021.  Most recent CT of the abdomen and pelvis on 03/01/2021 shows right percutaneous nephrostomy tube without hydronephrosis or signs of infection.  Also noted to have 74mm left upper pole renal calculus without hydronephrosis.  Urology being consulted for management of right nephrostomy tube and management of right staghorn calculus.   Review of systems: A 12 point comprehensive review of systems was obtained and is negative unless otherwise stated in the history of present illness.  Patient Active Problem List   Diagnosis Date Noted   Patient incapable of making informed decisions    Somnolence    Sepsis (HCC) 03/02/2021   Cellulitis 03/02/2021   Hyponatremia 03/02/2021   Renal failure    Hypotension 02/09/2021   Falls 02/09/2021   Syncope and collapse 02/09/2021   Severe sepsis (HCC) 01/25/2021   Iron deficiency anemia 01/25/2021   Generalized weakness    Acute-on-chronic kidney  injury (HCC) 01/24/2021   Complicated UTI (urinary tract infection) 01/24/2021   Renal mass, left 01/24/2021   Staghorn renal calculus 01/24/2021   Microscopic hematuria 01/24/2021   Aortic aneurysm (HCC) 01/24/2021   Closed burst fracture of lumbar vertebra (HCC) 01/24/2021   Acute respiratory failure with hypoxia (HCC) 08/13/2020   UTI (urinary tract infection) 06/02/2020   Community acquired pneumonia of right lower lobe of lung 06/02/2020   Acute hypoxemic respiratory failure (HCC) 06/02/2020   Hypokalemia 06/02/2020   Acute diastolic CHF (congestive heart failure) (HCC) 02/06/2020   Exertional shortness of breath 10/14/2019   Atrial fibrillation with RVR (HCC) 10/14/2019   Pleural effusion    Chronic heart failure with preserved ejection fraction (HCC)     No current facility-administered medications on file prior to encounter.   Current Outpatient Medications on File Prior to Encounter  Medication Sig Dispense Refill   acetaminophen (TYLENOL) 325 MG tablet Take 650 mg by mouth at bedtime.     albuterol (VENTOLIN HFA) 108 (90 Base) MCG/ACT inhaler Inhale 2 puffs into the lungs every 6 (six) hours as needed for wheezing or shortness of breath. 18 g 6   apixaban (ELIQUIS) 2.5 MG TABS tablet Take 1 tablet (2.5 mg total) by mouth 2 (two) times daily. 60 tablet 2   bisacodyl (DULCOLAX) 5 MG EC tablet Take 20 mg by mouth daily as needed for moderate constipation.     diltiazem (CARDIZEM CD) 120 MG 24 hr capsule Take 120 mg by mouth daily.     furosemide (LASIX) 40 MG tablet Take 1 tablet (40 mg total) by mouth daily.  30 tablet 1   hydrOXYzine (ATARAX/VISTARIL) 10 MG tablet Take 1 tablet (10 mg total) by mouth 3 (three) times daily as needed for itching. 30 tablet 0   metoprolol tartrate (LOPRESSOR) 25 MG tablet TAKE 1 TABLET(25 MG) BY MOUTH TWICE DAILY (Patient taking differently: Take 25 mg by mouth 2 (two) times daily.) 60 tablet 6   nicotine (NICODERM CQ - DOSED IN MG/24 HR) 7  mg/24hr patch Place 1 patch (7 mg total) onto the skin daily at 6 (six) AM. 28 patch 0   OVER THE COUNTER MEDICATION Take 1-2 tablets by mouth at bedtime. Sleeping pill     polyethylene glycol powder (GLYCOLAX/MIRALAX) 17 GM/SCOOP powder Take 17 g by mouth daily as needed for mild constipation.     tamsulosin (FLOMAX) 0.4 MG CAPS capsule Take 1 capsule (0.4 mg total) by mouth daily. 30 capsule 0   Tiotropium Bromide Monohydrate (SPIRIVA RESPIMAT) 2.5 MCG/ACT AERS INHALE 2 PUFFS BY MOUTH DAILY (Patient taking differently: Inhale 2 puffs into the lungs daily.) 4 g 6   Vitamin D, Ergocalciferol, (DRISDOL) 1.25 MG (50000 UNIT) CAPS capsule TAKE 1 CAPSULE BY MOUTH 1 TIME A WEEK (Patient taking differently: Take 50,000 Units by mouth every Saturday at 6 PM.) 12 capsule 0   Misc. Devices MISC Power wheelchair.  Diagnosis atrial fibrillation, gait abnormality 1 each 0    Past Medical History:  Diagnosis Date   Acute heart failure with preserved ejection fraction (HCC)    Atrial fibrillation with rapid ventricular response (HCC) 07/11/2019    Past Surgical History:  Procedure Laterality Date   APPENDECTOMY     IR GUIDED DRAIN W CATHETER PLACEMENT  01/25/2021   IR NEPHROSTOMY EXCHANGE RIGHT  03/06/2021   IR NEPHROSTOMY PLACEMENT RIGHT  01/25/2021    Social History   Tobacco Use   Smoking status: Light Smoker   Smokeless tobacco: Never  Substance Use Topics   Alcohol use: Not Currently   Drug use: Never    Family History  Problem Relation Age of Onset   Aneurysm Father     PE: Vitals:   03/10/21 0814 03/10/21 0826 03/10/21 1226 03/10/21 1537  BP:  116/72 104/65 137/79  Pulse:  81 84 78  Resp:  20 18 18   Temp:  98.3 F (36.8 C) 97.6 F (36.4 C) 98.2 F (36.8 C)  TempSrc:  Oral Oral Oral  SpO2: 97% 99% 98% 99%  Weight:       Patient appears to be in no acute distress  patient is alert and oriented x3 Atraumatic normocephalic head No cervical or supraclavicular lymphadenopathy  appreciated No increased work of breathing Regular sinus rhythm/rate Abdomen is soft, nontender, nondistended, no CVA or suprapubic tenderness R PCN tube - site with mild erythema, small amount of skin breakdown, no purulence noted; draining to bag with clear yellow urine Bedside urinal -tea colored urine Grossly neurologically intact   Recent Labs    03/08/21 0439 03/09/21 0039 03/10/21 0723  WBC 5.6 5.8 5.5  HGB 9.0* 8.3* 8.9*  HCT 27.3* 24.5* 26.4*   Recent Labs    03/08/21 0439 03/09/21 0039 03/10/21 0723  NA 136 133* 135  K 3.0* 3.3* 3.5  CL 114* 105 107  CO2 14* 22 21*  GLUCOSE 88 117* 98  BUN 25* 27* 22  CREATININE 1.56* 1.53* 1.34*  CALCIUM 9.3 8.8* 9.0   No results for input(s): LABPT, INR in the last 72 hours. No results for input(s): LABURIN in the last 72 hours.  Results for orders placed or performed during the hospital encounter of 03/01/21  Blood culture (routine single)     Status: None   Collection Time: 03/01/21  1:52 PM   Specimen: BLOOD LEFT ARM  Result Value Ref Range Status   Specimen Description BLOOD LEFT ARM  Final   Special Requests   Final    BOTTLES DRAWN AEROBIC AND ANAEROBIC Blood Culture results may not be optimal due to an inadequate volume of blood received in culture bottles   Culture   Final    NO GROWTH 5 DAYS Performed at [facility], [address] [place], [place] [address]    Report Status 03/06/2021 FINAL  Final  Urine culture     Status: Abnormal   Collection Time: 03/01/21 10:30 PM   Specimen: Urine, Random  Result Value Ref Range Status   Specimen Description URINE, RANDOM  Final   Special Requests   Final    NONE Performed at [facility], [address], [place], [place] [address]    Culture 40,000 COLONIES/mL STAPHYLOCOCCUS EPIDERMIDIS (A)  Final   Report Status 03/04/2021 FINAL  Final   Organism ID, Bacteria STAPHYLOCOCCUS EPIDERMIDIS (A)  Final      Susceptibility   Staphylococcus epidermidis -  MIC*    CIPROFLOXACIN <=0.5 SENSITIVE Sensitive     GENTAMICIN <=0.5 SENSITIVE Sensitive     NITROFURANTOIN <=16 SENSITIVE Sensitive     OXACILLIN >=4 RESISTANT Resistant     TETRACYCLINE 2 SENSITIVE Sensitive     VANCOMYCIN 1 SENSITIVE Sensitive     TRIMETH/SULFA 160 RESISTANT Resistant     CLINDAMYCIN >=8 RESISTANT Resistant     RIFAMPIN <=0.5 SENSITIVE Sensitive     Inducible Clindamycin NEGATIVE Sensitive     * 40,000 COLONIES/mL STAPHYLOCOCCUS EPIDERMIDIS  SARS CORONAVIRUS 2 (TAT 6-24 HRS) Nasopharyngeal Nasopharyngeal Swab     Status: None   Collection Time: 03/01/21 10:44 PM   Specimen: Nasopharyngeal Swab  Result Value Ref Range Status   SARS Coronavirus 2 NEGATIVE NEGATIVE Final    Comment: (NOTE) SARS-CoV-2 target nucleic acids are NOT DETECTED.  The SARS-CoV-2 RNA is generally detectable in upper and lower respiratory specimens during the acute phase of infection. Negative results do not preclude SARS-CoV-2 infection, do not rule out co-infections with other pathogens, and should not be used as the sole basis for treatment or other patient management decisions. Negative results must be combined with clinical observations, patient history, and epidemiological information. The expected result is Negative.  Fact Sheet for Patients: HairSlick.no  Fact Sheet for Healthcare Providers: quierodirigir.com  This test is not yet approved or cleared by the [place] FDA and  has been authorized for detection and/or diagnosis of SARS-CoV-2 by FDA under an Emergency Use Authorization (EUA). This EUA will remain  in effect (meaning this test can be used) for the duration of the COVID-19 declaration under Se ction 564(b)(1) of the Act, 21 U.S.C. section 360bbb-3(b)(1), unless the authorization is terminated or revoked sooner.  Performed at [facility], [address] [place], [place] [address]   Culture, Urine      Status: None   Collection Time: 03/07/21  5:05 PM   Specimen: Urine, Random  Result Value Ref Range Status   Specimen Description URINE, RANDOM  Final   Special Requests NONE  Final   Culture   Final    NO GROWTH Performed at [facility], [address], [place], [place] [address]  Report Status 03/10/2021 FINAL  Final    Imaging: CT Abd/Pelvis 03/01/21 IMPRESSION: Indwelling right percutaneous nephrostomy. Fragmented right staghorn calculus, including a dominant 4.7 cm calculus in the right proximal collecting system. No hydronephrosis.   16 mm posterior left upper pole renal calculus.  No hydronephrosis.   Small loculated right pleural effusion, improved.     Electronically Signed   By: Charline BillsSriyesh  Krishnan M.D.   On: 03/01/2021 19:39    Imp/Recommendations: Left Staghorn Calculus: Patient has significant stone burden on left side which would be best managed with a percutaneous nephrolithotomy.  However, patient is a poor surgical candidate for multiple reasons including anemia, A. fib requiring anticoagulation, history of pleural effusions, CHF and intermittent requirement of supplemental oxygen.  I would be very hesitant to proceed with surgical intervention in the setting.  Patient has to be intubated and placed prone for this type of surgery and there is a high risk of bleeding associated with the surgery.  Lastly he is extremely deconditioned and having difficulty turning over in bed.  Surgery would only exacerbate that.         Nonoperative management could include keeping nephrostomy tube and changing every 3 months by interventional radiology, possibility of capping nephrostomy tube and seeing if patient does well with this/possibly removing nephrostomy tube, attempted to give her nephrostomy tube to nephroureteral stent so that it may be capped and eventually changed out to an indwelling JJ stent.  All of these options have their own risks and benefits as well as  complications.  I am also concerned that patient has not fully emptying bladder.  CT from 03/01/2021 shows significantly distended bladder which is likely to cause a sending infection to the kidneys.  This was seen on admission 01/24/2021 when patient had both emphysematous pyelitis of the right kidney and left renal abscess.  Recommendations: Please BladderScan patient and if elevated PVR Foley catheter would be recommended to help prevent upper tract infection R PCN tube management: I will discussed management options with patient's primary team.  I would recommend a trial of clamping the tube to see how he did however would not do this without ensuring patient is adequately emptying bladder or Foley catheter is in place. I told patient I would personally tell him if he was not a surgical candidate definitively.  I will go by the next 1-2 days to have this discussion with him.    Aashish Hamm D Danaria Larsen

## 2021-03-11 DIAGNOSIS — A419 Sepsis, unspecified organism: Secondary | ICD-10-CM | POA: Diagnosis not present

## 2021-03-11 DIAGNOSIS — R6520 Severe sepsis without septic shock: Secondary | ICD-10-CM | POA: Diagnosis not present

## 2021-03-11 DIAGNOSIS — N179 Acute kidney failure, unspecified: Secondary | ICD-10-CM | POA: Diagnosis not present

## 2021-03-11 LAB — CBC WITH DIFFERENTIAL/PLATELET
Abs Immature Granulocytes: 0.03 10*3/uL (ref 0.00–0.07)
Basophils Absolute: 0.1 10*3/uL (ref 0.0–0.1)
Basophils Relative: 1 %
Eosinophils Absolute: 0.2 10*3/uL (ref 0.0–0.5)
Eosinophils Relative: 3 %
HCT: 28 % — ABNORMAL LOW (ref 39.0–52.0)
Hemoglobin: 9.1 g/dL — ABNORMAL LOW (ref 13.0–17.0)
Immature Granulocytes: 1 %
Lymphocytes Relative: 14 %
Lymphs Abs: 0.7 10*3/uL (ref 0.7–4.0)
MCH: 27.6 pg (ref 26.0–34.0)
MCHC: 32.5 g/dL (ref 30.0–36.0)
MCV: 84.8 fL (ref 80.0–100.0)
Monocytes Absolute: 0.4 10*3/uL (ref 0.1–1.0)
Monocytes Relative: 8 %
Neutro Abs: 3.8 10*3/uL (ref 1.7–7.7)
Neutrophils Relative %: 73 %
Platelets: 221 10*3/uL (ref 150–400)
RBC: 3.3 MIL/uL — ABNORMAL LOW (ref 4.22–5.81)
RDW: 21.6 % — ABNORMAL HIGH (ref 11.5–15.5)
WBC: 5.2 10*3/uL (ref 4.0–10.5)
nRBC: 0 % (ref 0.0–0.2)

## 2021-03-11 LAB — BASIC METABOLIC PANEL
Anion gap: 4 — ABNORMAL LOW (ref 5–15)
BUN: 22 mg/dL (ref 8–23)
CO2: 22 mmol/L (ref 22–32)
Calcium: 8.7 mg/dL — ABNORMAL LOW (ref 8.9–10.3)
Chloride: 110 mmol/L (ref 98–111)
Creatinine, Ser: 1.45 mg/dL — ABNORMAL HIGH (ref 0.61–1.24)
GFR, Estimated: 48 mL/min — ABNORMAL LOW (ref >60)
Glucose, Bld: 128 mg/dL — ABNORMAL HIGH (ref 70–99)
Potassium: 4 mmol/L (ref 3.5–5.1)
Sodium: 136 mmol/L (ref 135–145)

## 2021-03-11 LAB — GLUCOSE, CAPILLARY: Glucose-Capillary: 112 mg/dL — ABNORMAL HIGH (ref 70–99)

## 2021-03-11 LAB — MAGNESIUM: Magnesium: 2.1 mg/dL (ref 1.7–2.4)

## 2021-03-11 NOTE — Progress Notes (Signed)
   Subjective: No overnight events.  Patient is resting comfortably.  Does not have any complaints today.  States that he wants to go home and states this is the best he has felt.  Objective:  Vital signs in last 24 hours: Vitals:   03/11/21 0412 03/11/21 0829 03/11/21 0840 03/11/21 1204  BP: 121/65  129/73 112/66  Pulse: 80  84 67  Resp: (!) 22  15 (!) 25  Temp: 98.2 F (36.8 C)  (!) 97.5 F (36.4 C) 98.3 F (36.8 C)  TempSrc: Oral  Oral Oral  SpO2: 96% 98% 100% 99%  Weight: 75 kg      Physical Exam General: alert, appears stated age, in no acute distress HEENT: Normocephalic, atraumatic, EOM intact, conjunctiva normal CV: Irregularly irregular, no murmurs rubs or gallops Pulm: Clear to auscultation bilaterally, normal work of breathing Abdomen: Soft, nondistended, bowel sounds present, no tenderness to palpation MSK: No lower extremity edema Skin: Warm and dry  Antibiotics Cefazolin 7/6-7/7 Cefepime 7/7 -> 7/12 Vancomycin 7/8 -> 7/12 Linezolid 7/11->7/12 Doxycycline 7/12->7/14 Linezolid 7/14 ->   Culture data Blood cultures NGTD x5 days 7/6 urine cultures: Staph epidermis, 40 K colonies, susceptibilities in chart 7/12 urine cultures: Pending   R.Nephrostomy tube exchange date : 03/06/2021  Assessment/Plan:  Principal Problem:   Sepsis (HCC) Active Problems:   Chronic heart failure with preserved ejection fraction (HCC)   Acute-on-chronic kidney injury (HCC)   Complicated UTI (urinary tract infection)   Staghorn renal calculus   Cellulitis   Hyponatremia   Somnolence   Patient incapable of making informed decisions   Hospital day 7 for Alfred Mercado a 85 y.o. person living with HFpEF, persistent atrial fibrillation, COPD, CKD 3B , recent emphysematous pyelitis and left perinephric abscess s/p nephrostomy tube ( removed before discharge 6/19) , and right staghorn calculus with right percutaneous nephrostomy tube admitted with sepsis.   Complicated UTI,  Staph epidermis Purulent Cellulitis from R. Nephrostomy tube insertion site  - Stable, blood pressures have improved - No growth on urine culture, will continue to follow - Antibiotics discontinued, monitoring for signs of recurrent infection - Urology recommends bladder scan to see if patient is retaining urine, may need Foley catheter if retaining.  If not retaining can try a clamping trial - Patient is not a surgical candidate for left staghorn calculus - Patient will need IR to exchange PCN tube every 76-month -Urology is following, appreciate recommendations   Capacity Psychiatry formally evaluated patient and patient lacks capacity to make informed decisions.  Emergent guardianship process underway.  Per RN today patient is alert and oriented x4 will have psych reevaluate for capacity. -Consulted psychiatry for reevaluation of capacity, appreciate recommendation   Atrial fibrillation Patient in A. fib with normal ventricular rate -Continue Eliquis 2.5 twice daily   Normocytic Anemia Hx of IDA, iron 33, tibc 277, ferritin 63 on 6/1. Received iron transfusion on last admission when these labs were collected.  Hemoglobin stable, 9.1   Diet: Regular VTE: Eliquis Code: Full PT/OT recs: SNF   Dispo: Anticipated discharge to Skilled nursing facility pending improvement in medical condition.  Alfred Mercado N, DO 03/11/2021, 2:22 PM Pager: [phone] After 5pm on weekdays and 1pm on weekends: On Call pager [phone]

## 2021-03-11 NOTE — Progress Notes (Signed)
Sent chat message to Dr. Criselda Peaches and her team:  Dr. Criselda Peaches, can we order a psych re-evaluation? The patient is AAOx4, and really wants to return to the hotel, which he considers home. He states that his neighbors are his support system, and take him to all of his appointments. If he can not return there from this hospitalization, he will lose his home,where he has lived for the last 15 years.

## 2021-03-12 NOTE — Progress Notes (Signed)
[name] pastor and wife came to see pt today. Pastor Sherri Sear [facility]) (pt's advocate)  [phone]. They are requesting SW/CM to give them a call, had concerns about pt's care/plan of care, discharge plan.    Pt cannot hear out of RIGHT ear, staff needs to stand on pt's left side to talk so pt can hear and understand what is being said.    Pt can be a little disoriented when first awakens and needs to be given a moment to get awake and adjust himself.   Pt is more approachable if  staff takes time to discuss what you need and why.    Need Psych to come back to do the re-eval for capacity, and have nursing go in with him/her and make sure pt is awake, himself and can hear the MD (based on the above entry).

## 2021-03-12 NOTE — Progress Notes (Signed)
PT Cancellation Note  Patient Details Name: Alfred Mercado MRN: [id number] DOB: 1936-03-29   Cancelled Treatment:    Reason Eval/Treat Not Completed: Patient declined, no reason specified Adamantly refuses PT, tells me over and over he will "Move around later but I don't want to do it right now". Becomes annoyed with me when I explain that unfortunately the only +2 help available to get OOB is right now and continues to refuse. Will continue efforts.   Madelaine Etienne, DPT, PN1   Supplemental Physical Therapist [facility]    Pager [phone] Acute Rehab Office [phone]

## 2021-03-12 NOTE — Consult Note (Signed)
  Patient seen face to face in his hospital room but unable to participate in re-evaluation for capacity to make informed medical decision due to being lethargic, uncooperative and confused.  Re-consult psychiatric consult service when patient is able to participate in capacity assessment.  Thedore Mins, MD Attending psychiatrist

## 2021-03-12 NOTE — Progress Notes (Signed)
   Subjective: No acute overnight events.  Patient seen sleeping comfortably in bed today.  Woke up briefly denies any complaints at this time.  Objective:  Vital signs in last 24 hours: Vitals:   03/12/21 0100 03/12/21 0200 03/12/21 0857 03/12/21 0936  BP:    119/70  Pulse: 97 90 94 83  Resp: (!) 23 (!) 23 19 (!) 22  Temp:    98.1 F (36.7 C)  TempSrc:    Oral  SpO2: 97% 98% 98% 98%  Weight:       Physical Exam General: alert, appears stated age, in no acute distress CV: Irregularly irregular rate and rhythm, no murmurs rubs or gallops Pulm: Clear to auscultation bilaterally, normal work of breathing Abdomen: Soft, nondistended, bowel sounds present, no tenderness to palpation MSK: No lower extremity edema Skin: Warm and dry   Assessment/Plan:  Principal Problem:   Sepsis (HCC) Active Problems:   Chronic heart failure with preserved ejection fraction (HCC)   Acute-on-chronic kidney injury (HCC)   Complicated UTI (urinary tract infection)   Staghorn renal calculus   Cellulitis   Hyponatremia   Somnolence   Patient incapable of making informed decisions  Alfred Mercado is a 85 y.o. person living with HFpEF, persistent atrial fibrillation, COPD, CKD 3B , recent emphysematous pyelitis and left perinephric abscess s/p nephrostomy tube ( removed before discharge 6/19) , and right staghorn calculus with right percutaneous nephrostomy tube admitted with sepsis.   Complicated UTI, Staph epidermis Purulent Cellulitis from R. Nephrostomy tube insertion site  - Stable, blood pressures have improved - No growth on repeat urine culture, will continue to follow - Antibiotics discontinued, monitoring for signs of recurrent infection - Bladder scan did not show any urinary retention, can proceed with a clamping trial - Patient is not a surgical candidate for left staghorn calculus - Patient will need IR to exchange PCN tube every 38-month - Urology is following, appreciate  recommendations   Capacity Psychiatry formally evaluated patient and patient lacks capacity to make informed decisions.  Emergent guardianship process underway.   - Consulted psychiatry for reevaluation of capacity, appreciate recommendation   Atrial fibrillation Patient in A. fib with normal ventricular rate - Continue Eliquis 2.5 twice daily   Normocytic Anemia Hx of IDA, iron 33, tibc 277, ferritin 63 on 6/1. Received iron transfusion on last admission when these labs were collected.   - Trend CBC  Prior to Admission Living Arrangement: Anticipated Discharge Location: Barriers to Discharge: Dispo: Anticipated discharge pending placement.  Jaci Standard, DO 03/12/2021, 9:38 AM Pager: [phone] After 5pm on weekdays and 1pm on weekends: On Call pager [phone]

## 2021-03-12 NOTE — Progress Notes (Addendum)
Return to patient's bedside this morning to definitively tell him that he has not a surgical candidate.  I would like to make progress managing right nephrostomy tube.   -If post void residual volumes are less than 300 cc, right percutaneous nephrostomy tube can be capped and would repeat post void residual bladder scan at least 2 more times to ensure low residuals

## 2021-03-13 DIAGNOSIS — N390 Urinary tract infection, site not specified: Secondary | ICD-10-CM | POA: Diagnosis not present

## 2021-03-13 DIAGNOSIS — Z789 Other specified health status: Secondary | ICD-10-CM | POA: Diagnosis not present

## 2021-03-13 DIAGNOSIS — N200 Calculus of kidney: Secondary | ICD-10-CM | POA: Diagnosis not present

## 2021-03-13 LAB — CBC WITH DIFFERENTIAL/PLATELET
Abs Immature Granulocytes: 0.04 10*3/uL (ref 0.00–0.07)
Basophils Absolute: 0.1 10*3/uL (ref 0.0–0.1)
Basophils Relative: 1 %
Eosinophils Absolute: 0.2 10*3/uL (ref 0.0–0.5)
Eosinophils Relative: 3 %
HCT: 28.4 % — ABNORMAL LOW (ref 39.0–52.0)
Hemoglobin: 9.3 g/dL — ABNORMAL LOW (ref 13.0–17.0)
Immature Granulocytes: 1 %
Lymphocytes Relative: 18 %
Lymphs Abs: 1 10*3/uL (ref 0.7–4.0)
MCH: 28.1 pg (ref 26.0–34.0)
MCHC: 32.7 g/dL (ref 30.0–36.0)
MCV: 85.8 fL (ref 80.0–100.0)
Monocytes Absolute: 0.5 10*3/uL (ref 0.1–1.0)
Monocytes Relative: 9 %
Neutro Abs: 3.6 10*3/uL (ref 1.7–7.7)
Neutrophils Relative %: 68 %
Platelets: 220 10*3/uL (ref 150–400)
RBC: 3.31 MIL/uL — ABNORMAL LOW (ref 4.22–5.81)
RDW: 21.2 % — ABNORMAL HIGH (ref 11.5–15.5)
WBC: 5.3 10*3/uL (ref 4.0–10.5)
nRBC: 0 % (ref 0.0–0.2)

## 2021-03-13 LAB — BASIC METABOLIC PANEL
Anion gap: 7 (ref 5–15)
BUN: 29 mg/dL — ABNORMAL HIGH (ref 8–23)
CO2: 25 mmol/L (ref 22–32)
Calcium: 9 mg/dL (ref 8.9–10.3)
Chloride: 103 mmol/L (ref 98–111)
Creatinine, Ser: 1.36 mg/dL — ABNORMAL HIGH (ref 0.61–1.24)
GFR, Estimated: 52 mL/min — ABNORMAL LOW (ref >60)
Glucose, Bld: 100 mg/dL — ABNORMAL HIGH (ref 70–99)
Potassium: 4 mmol/L (ref 3.5–5.1)
Sodium: 135 mmol/L (ref 135–145)

## 2021-03-13 LAB — GLUCOSE, CAPILLARY: Glucose-Capillary: 130 mg/dL — ABNORMAL HIGH (ref 70–99)

## 2021-03-13 NOTE — Plan of Care (Signed)

## 2021-03-13 NOTE — Progress Notes (Signed)
Physical Therapy Treatment Patient Details Name: Alfred Mercado MRN: [id number] DOB: 1936/04/03 Today's Date: 03/13/2021    History of Present Illness 85 year old male with a past medical history of HFpEF, persistent atrial fibrillation, COPD, CKD IIIb, and multiple recent admissions for complicated UTI with bilateral pyelonephritis requiring nephrostomy tubes. Admitted through ED 03/01/21 for dizziness and AMS related to sepsis started on IV cefazolin for UTI and nephrostomy site cellulitis.    PT Comments    Pt in bed on arrival, agitated/disgruntled regarding hospitalization. Provided active listening which pt appreciated. Repeatedly stating "I just want someone to tell me the truth," and perseverating on a new male lawyer being involved in his case. Pt only agreeable to bed mobility for repositioning. He required mod assist rolling and max assist to scoot up in bed, with bed in trendelenburg. Ensure provided to pt at end of session.    Follow Up Recommendations  SNF;Supervision for mobility/OOB     Equipment Recommendations  None recommended by PT    Recommendations for Other Services       Precautions / Restrictions Precautions Precautions: Fall Restrictions Other Position/Activity Restrictions: pt had a TLSO prescribed for comfort after his L1 burst fx, but does not use    Mobility  Bed Mobility Overal bed mobility: Needs Assistance Bed Mobility: Rolling Rolling: Mod assist         General bed mobility comments: mod assist for rolling, max assist scooting up in bed    Transfers                    Ambulation/Gait                 Stairs             Wheelchair Mobility    Modified Rankin (Stroke Patients Only)       Balance                                            Cognition Arousal/Alertness: Awake/alert Behavior During Therapy: Agitated Overall Cognitive Status: Impaired/Different from baseline Area of  Impairment: Attention;Memory;Following commands;Safety/judgement;Awareness;Problem solving                   Current Attention Level: Sustained Memory: Decreased short-term memory Following Commands: Follows one step commands with increased time Safety/Judgement: Decreased awareness of safety;Decreased awareness of deficits Awareness: Intellectual Problem Solving: Slow processing;Difficulty sequencing;Requires verbal cues;Requires tactile cues;Decreased initiation General Comments: Agitated over current situation. Poor medical insight. "I just want someone to tell me the truth." "I want to make up my own mind over whether or not I want to work with you, if you can benefit me."      Exercises      General Comments        Pertinent Vitals/Pain Pain Assessment: Faces Faces Pain Scale: Hurts little more Pain Location: generalized discomfort Pain Descriptors / Indicators: Discomfort;Grimacing Pain Intervention(s): Limited activity within patient's tolerance;Repositioned    Home Living                      Prior Function            PT Goals (current goals can now be found in the care plan section) Acute Rehab PT Goals Patient Stated Goal: "figure out what's best for me" Progress towards PT goals: Not progressing toward goals -  comment (agitation, decreased participation in therapy)    Frequency    Min 2X/week      PT Plan Frequency needs to be updated    Co-evaluation              AM-PAC PT "6 Clicks" Mobility   Outcome Measure  Help needed turning from your back to your side while in a flat bed without using bedrails?: A Lot Help needed moving from lying on your back to sitting on the side of a flat bed without using bedrails?: A Lot Help needed moving to and from a bed to a chair (including a wheelchair)?: Total Help needed standing up from a chair using your arms (e.g., wheelchair or bedside chair)?: Total Help needed to walk in hospital  room?: Total Help needed climbing 3-5 steps with a railing? : Total 6 Click Score: 8    End of Session   Activity Tolerance: Treatment limited secondary to agitation Patient left: in bed;with call bell/phone within reach;with bed alarm set Nurse Communication: Mobility status PT Visit Diagnosis: Unsteadiness on feet (R26.81);Muscle weakness (generalized) (M62.81);Difficulty in walking, not elsewhere classified (R26.2);History of falling (Z91.81);Repeated falls (R29.6);Other abnormalities of gait and mobility (R26.89);Pain     Time: 1497-0263 PT Time Calculation (min) (ACUTE ONLY): 15 min  Charges:  $Therapeutic Activity: 8-22 mins                     Aida Raider, PT  Office # [phone] Pager [phone]    Ilda Foil 03/13/2021, 11:03 AM

## 2021-03-13 NOTE — Progress Notes (Signed)
This chaplain is present for F/U spiritual care. The Pt. greets me with, "Hey Chaplain!"  The chaplain pulls up the chair and listens to the Pt. The Pt. proceeds with, "I am ready to go home. I have things to take care of, changes I want to make."  The Pt. does not give the chaplain any details to what he is thinking about.   Continued listening leads to the chaplain understanding the Pt. regrets how he negatively treated the medical team.  The Pt. wants to do better.   When the chaplain asked about the pastor's visit, the Pt. was at a loss for names but expressed his gratefulness for the pastor's prayer. The chaplain learns from the Pt., "you can never have enough prayer."  The chaplain and the Pt. talk about the next visit.

## 2021-03-13 NOTE — TOC Progression Note (Signed)
Transition of Care [facility]) - Progression Note    Patient Details  Name: Alfred Mercado MRN: [id number] Date of Birth: 29-May-1936  Transition of Care [facility]) CM/SW Contact  Mearl Latin, LCSW Phone Number: 03/13/2021, 12:26 PM  Clinical Narrative:    CSW received request to contact patient's pastor, Virl Diamond. CSW spoke with him. He stated he will have his wife, a Child psychotherapist, contact CSW as she is better suited to asking questions to best represent the patient.    Expected Discharge Plan: Skilled Nursing Facility Barriers to Discharge: Continued Medical Work up  Expected Discharge Plan and Services Expected Discharge Plan: Skilled Nursing Facility In-house Referral: Clinical Social Work, Chaplain     Living arrangements for the past 2 months: Hotel/Motel                                       Social Determinants of Health (SDOH) Interventions    Readmission Risk Interventions No flowsheet data found.

## 2021-03-13 NOTE — Progress Notes (Signed)
   Subjective: No acute overnight events.  Patient was sleeping comfortably during bedside evaluation.  He seems very frustrated with his stay at the hospital and not understanding why he is still here and unable to go home.  Denies any pain or complaints at this time.  Objective:  Vital signs in last 24 hours: Vitals:   03/12/21 2301 03/13/21 0400 03/13/21 0450 03/13/21 0747  BP: (!) 96/56 113/66    Pulse: 96 98    Resp: 18 (!) 23    Temp: 97.8 F (36.6 C) 98.2 F (36.8 C)    TempSrc: Oral Oral    SpO2: 95% 98%  100%  Weight:   73.9 kg    Physical Exam General: alert, appears stated age, in no acute distress CV: Irregularly irregular rate and rhythm, no murmurs rubs or gallops Pulm: Clear to auscultation bilaterally, normal work of breathing Abdomen: Soft, nondistended, bowel sounds present, no tenderness to palpation MSK: No lower extremity edema Skin: Warm and dry   Assessment/Plan:  Principal Problem:   Sepsis (HCC) Active Problems:   Chronic heart failure with preserved ejection fraction (HCC)   Acute-on-chronic kidney injury (HCC)   Complicated UTI (urinary tract infection)   Staghorn renal calculus   Cellulitis   Hyponatremia   Somnolence   Patient incapable of making informed decisions  HOYLE BARKDULL is a 85 y.o. person living with HFpEF, persistent atrial fibrillation, COPD, CKD 3B , recent emphysematous pyelitis and left perinephric abscess s/p nephrostomy tube ( removed before discharge 6/19) , and right staghorn calculus with right percutaneous nephrostomy tube admitted with sepsis.   Complicated UTI, Staph epidermis Purulent Cellulitis from R. Nephrostomy tube insertion site  Stable.  No signs of recurrent infection since discontinuation of antibiotics thus far. Bladder scan a few days ago did not show any urinary retention however will repeat today and if residual volume is less than 300 cc we will proceed with capping the nephrostomy tube followed with  repeat bladder scans x2 to assess for urinary retention - Patient will need IR to exchange PCN tube every 22-month - Urology is following, appreciate recommendations   Capacity Patient previously evaluated by psych and deemed to lack capacity for medical decision making.  Psych was reconsulted to reevaluate for capacity due to fluctuation in mentation however patient still appears to not have great understanding of his disease process or why he is hospitalized.  Agree with previous psychiatry evaluation.  Will defer reevaluation for capacity at this time.   Atrial fibrillation Patient in A. fib with normal ventricular rate - Continue Eliquis 2.5 twice daily   Normocytic Anemia Hx of IDA, iron 33, tibc 277, ferritin 63 on 6/1. Received iron transfusion on last admission when these labs were collected.   - Trend CBC  Dispo: Anticipated discharge pending placement.  Aury Scollard N, DO 03/13/2021, 7:56 AM Pager: [phone] After 5pm on weekdays and 1pm on weekends: On Call pager [phone]

## 2021-03-14 ENCOUNTER — Telehealth: Payer: Self-pay

## 2021-03-14 DIAGNOSIS — I5032 Chronic diastolic (congestive) heart failure: Secondary | ICD-10-CM | POA: Diagnosis not present

## 2021-03-14 DIAGNOSIS — E440 Moderate protein-calorie malnutrition: Secondary | ICD-10-CM | POA: Insufficient documentation

## 2021-03-14 DIAGNOSIS — N390 Urinary tract infection, site not specified: Secondary | ICD-10-CM | POA: Diagnosis not present

## 2021-03-14 NOTE — Progress Notes (Signed)
  Date: 03/14/2021  Patient name: Alfred Mercado  Medical record number: [id number]  Date of birth: Feb 26, 1936        I have seen and evaluated this patient and I have discussed the plan of care with the house staff. Please see Dr. Patty Sermons note for complete details. I concur with her findings and plan.   Inez Catalina, MD 03/14/2021, 6:47 PM

## 2021-03-14 NOTE — TOC Progression Note (Signed)
Transition of Care [facility]) - Progression Note    Patient Details  Name: BELINDA BRINGHURST MRN: [id number] Date of Birth: 14-Sep-1935  Transition of Care [facility]) CM/SW [place], LCSW Phone Number: 03/14/2021, 6:06 PM  Clinical Narrative:    9am-CSW received call from patient's "pastor's" wife. She stated that though patient is hard of hearing, she believes patient has capacity though she just met him for the first time on Sunday. She believes rehab will be helpful for the patient and requests CSW look into CAPS services for when patient gets out of rehab. There is a waitlist for CAPS so SNF may need to do a PCS referral through patient's Medicaid first for aide services. CSW explained that when psychiatry and the MDs speak with patient, they believed he does not have capacity to make his own medical decisions. She asked how to become patient's HCPOA and CSW explained that could not be accomplished unless patient is deemed to have capacity but that any one close to patient can assist in helping him sign admissions paperwork at SNF, which is the current concern.  5pm-CSW spoke with patient. He was agreeable to conversation with CSW, though CSW had to repeat things several times. He requested a SNF close to his current motel. CSW noted those would be [facility], [facility], and [facility]. He selected [facility] and wanted to know when he could go there and if it would be a private room. CSW contacted [facility] and left a message requesting a bed. Covering CSW to follow up with them tomorrow. CSW asked if he would be able to do his own paperwork there. To this, he stated, "I'm 76 not dead." CSW pointed out that he had refused to look at the paper with snfs listed and the map that CSW provided. He stated he just would rather listen to me talk instead and he did not have his glasses. Upon exiting, he requested a newspaper, which CSW brought him.   CSW left voicemail for APS, Ms.  Jimmye Norman, to ask if patient could be released to [facility] and sign his own paperwork.    Expected Discharge Plan: [place] Barriers to Discharge: Continued Medical Work up  Expected Discharge Plan and Services Expected Discharge Plan: [place] In-house Referral: Clinical Social Work, Chaplain     Living arrangements for the past 2 months: Hotel/Motel                                       Social Determinants of Health (SDOH) Interventions    Readmission Risk Interventions No flowsheet data found.

## 2021-03-14 NOTE — Progress Notes (Signed)
   Subjective: No acute overnight events.  Patient did not want to speak with the team today. States he needs to urinate.  Objective:  Vital signs in last 24 hours: Vitals:   03/13/21 2340 03/14/21 0455 03/14/21 0736 03/14/21 1201  BP: 97/66 118/64 (!) 95/47 (!) 96/50  Pulse: 91 83 85 81  Resp: 20 (!) 25 18 18   Temp: 98.1 F (36.7 C) 98 F (36.7 C) 98.5 F (36.9 C) 97.7 F (36.5 C)  TempSrc: Oral Oral Oral Oral  SpO2: 97% 96% 96% 96%  Weight:       Physical Exam General: alert, appears stated age, in no acute distress CV: Irregularly irregular rate and rhythm, no murmurs rubs or gallops Pulm: Clear to auscultation bilaterally, normal work of breathing Abdomen: Soft, nondistended, bowel sounds present, no tenderness to palpation MSK: No lower extremity edema Skin: Warm and dry   Assessment/Plan:  Principal Problem:   Sepsis (HCC) Active Problems:   Chronic heart failure with preserved ejection fraction (HCC)   Acute-on-chronic kidney injury (HCC)   Complicated UTI (urinary tract infection)   Staghorn renal calculus   Cellulitis   Hyponatremia   Somnolence   Patient incapable of making informed decisions   Malnutrition of moderate degree  Alfred Mercado is a 85 y.o. person living with HFpEF, persistent atrial fibrillation, COPD, CKD 3B , recent emphysematous pyelitis and left perinephric abscess s/p nephrostomy tube ( removed before discharge 6/19) , and right staghorn calculus with right percutaneous nephrostomy tube admitted with sepsis.   Complicated UTI, Staph epidermis Purulent Cellulitis from R. Nephrostomy tube insertion site  Stable.  No signs of recurrent infection since discontinuation of antibiotics thus far.repeat bladder scan with no signs of urinary retention.  Nephrostomy tube was capped yesterday and patient is tolerating this well.  We will continue with intermittent bladder scans if needed. - Patient will need IR to exchange PCN tube every  68-month - Urology is following, appreciate recommendations   Capacity Psychiatry patient to lack capacity.  Agree with this evaluation.  We will continue to monitor. -Pending guardianship   Atrial fibrillation - Continue Eliquis 2.5 twice daily   Normocytic Anemia Hx of IDA, iron 33, tibc 277, ferritin 63 on 6/1. Received iron transfusion on last admission when these labs were collected.   - Trend CBC  Dispo: Anticipated discharge pending placement.  [name], DO 03/14/2021, 1:16 PM Pager: [phone] After 5pm on weekdays and 1pm on weekends: On Call pager [phone]

## 2021-03-14 NOTE — Telephone Encounter (Signed)
Call received from patient, he said he wanted to speak to Dr Alvis Lemmings and make sure that she is still his PCP. This CM explained multiple times that Dr Alvis Lemmings is still his PCP but she is not providing orders when he is in the hospital.  The specialists at the hospital are overseeing his care.   He said that people are trying to send him places.  This CM explained the benefit of discharging to a facility for rehab prior to returning home. He said that he understood that and he asked again if he could talk to Dr Alvis Lemmings  and wanted to know from her if she is still his PCP. This CM again explained that she still is his PCP.

## 2021-03-14 NOTE — Telephone Encounter (Signed)
I called the numbers on file and first number was not working and second number was for the [facility] lodge

## 2021-03-14 NOTE — Plan of Care (Signed)

## 2021-03-15 DIAGNOSIS — N390 Urinary tract infection, site not specified: Secondary | ICD-10-CM | POA: Diagnosis not present

## 2021-03-15 DIAGNOSIS — E440 Moderate protein-calorie malnutrition: Secondary | ICD-10-CM | POA: Diagnosis not present

## 2021-03-15 DIAGNOSIS — Z789 Other specified health status: Secondary | ICD-10-CM | POA: Diagnosis not present

## 2021-03-15 DIAGNOSIS — N200 Calculus of kidney: Secondary | ICD-10-CM | POA: Diagnosis not present

## 2021-03-15 LAB — GLUCOSE, CAPILLARY
Glucose-Capillary: 150 mg/dL — ABNORMAL HIGH (ref 70–99)
Glucose-Capillary: 104 mg/dL — ABNORMAL HIGH (ref 70–99)
Glucose-Capillary: 118 mg/dL — ABNORMAL HIGH (ref 70–99)

## 2021-03-15 MED ORDER — ADULT MULTIVITAMIN W/MINERALS CH
1.0000 | ORAL_TABLET | Freq: Every day | ORAL | Status: DC
  Administered 2021-03-15 – 2021-03-17 (×3): 1 via ORAL
  Filled 2021-03-15 (×3): qty 1

## 2021-03-15 NOTE — Progress Notes (Signed)
Nutrition Follow-up  DOCUMENTATION CODES:   Non-severe (moderate) malnutrition in context of chronic illness  INTERVENTION:   -Continue Ensure Enlive po TID, each supplement provides 350 kcal and 20 grams of protein  -MVI with minerals daily -Magic cup BID with meals, each supplement provides 290 kcal and 9 grams of protein   NUTRITION DIAGNOSIS:   Moderate Malnutrition related to chronic illness (HF, COPD) as evidenced by moderate fat depletion, moderate muscle depletion.  Ongoing  GOAL:   Patient will meet greater than or equal to 90% of their needs  Progressing  MONITOR:   PO intake, Supplement acceptance, Skin, Weight trends, Labs, I & O's  REASON FOR ASSESSMENT:   Consult Assessment of nutrition requirement/status  ASSESSMENT:   85 y.o. person living with HFpEF, persistent atrial fibrillation, COPD, CKD 3B , recent emphysematous pyelitis and left perinephric abscess s/p nephrostomy tube (removed before discharge 6/19) , and right staghorn calculus with right percutaneous nephrostomy tube admitted with sepsis.  7/18- nephrostomy tube clamped  Reviewed I/O's: -1.1 L x 24 hours and -12.2 L since admission  UOP: 1.2 L x 24 hours  Pt sleepy at time of visit and did not respond to name being called.   Pt with fair oral intake; noted meal completion 25-40%. Pt is consuming Ensure supplements.   Per therapy notes, recommending SNF placement at discharge.   Medications reviewed and include miralax.   Labs reviewed: CBGS: 112-150.   Diet Order:   Diet Order             Diet regular Room service appropriate? Yes; Fluid consistency: Thin  Diet effective now                   EDUCATION NEEDS:   Not appropriate for education at this time  Skin:  Skin Assessment: Skin Integrity Issues: Skin Integrity Issues:: Other (Comment) Other: puncture wounds to back  Last BM:  03/14/21  Height:   Ht Readings from Last 1 Encounters:  03/01/21 6\' 1"  (1.854 m)     Weight:   Wt Readings from Last 1 Encounters:  03/15/21 73.4 kg    Ideal Body Weight:  83.6 kg  BMI:  Body mass index is 21.35 kg/m.  Estimated Nutritional Needs:   Kcal:  1900-2050  Protein:  90-105 grams  Fluid:  >/= 1.9 L/day    03/17/21, RD, LDN, CDCES Registered Dietitian II Certified Diabetes Care and Education Specialist Please refer to [facility] for RD and/or RD on-call/weekend/after hours pager

## 2021-03-15 NOTE — Care Management Important Message (Signed)
Important Message  Patient Details  Name: Alfred Mercado MRN: [id number] Date of Birth: 03-08-1936   Medicare Important Message Given:  Yes  Called the patient room spoke with went Over IM and patint advised me to mail to his home.  Mahrosh Donnell 03/15/2021, 3:38 PM

## 2021-03-15 NOTE — Plan of Care (Signed)
  Problem: Clinical Measurements: Goal: Cardiovascular complication will be avoided Outcome: Progressing   Problem: Clinical Measurements: Goal: Diagnostic test results will improve Outcome: Progressing   Problem: Activity: Goal: Risk for activity intolerance will decrease Outcome: Progressing   Problem: Elimination: Goal: Will not experience complications related to urinary retention Outcome: Progressing   Problem: Pain Managment: Goal: General experience of comfort will improve Outcome: Progressing   Problem: Safety: Goal: Ability to remain free from injury will improve Outcome: Progressing   Problem: Skin Integrity: Goal: Risk for impaired skin integrity will decrease Outcome: Progressing

## 2021-03-15 NOTE — Plan of Care (Signed)
  Problem: Education: Goal: Knowledge of General Education information will improve Description Including pain rating scale, medication(s)/side effects and non-pharmacologic comfort measures Outcome: Progressing   Problem: Health Behavior/Discharge Planning: Goal: Ability to manage health-related needs will improve Outcome: Progressing   

## 2021-03-15 NOTE — Progress Notes (Signed)
Occupational Therapy Treatment Patient Details Name: Alfred Mercado MRN: [id number] DOB: 01-27-1936 Today's Date: 03/15/2021    History of present illness 85 year old male with a past medical history of HFpEF, persistent atrial fibrillation, COPD, CKD IIIb, and multiple recent admissions for complicated UTI with bilateral pyelonephritis requiring nephrostomy tubes. Admitted through ED 03/01/21 for dizziness and AMS related to sepsis started on IV cefazolin for UTI and nephrostomy site cellulitis.   OT comments  Pt not refusing, but did require increased assistance for bed level mobility, putting forth little effort. Sat EOB x 10 minutes with min to min guard assist. Cognition remains limiting factor in pt's ability to participate. Continue to recommend SNF.   Follow Up Recommendations  SNF    Equipment Recommendations  Other (comment) (defer to next venue)    Recommendations for Other Services      Precautions / Restrictions Precautions Precautions: Fall       Mobility Bed Mobility Overal bed mobility: Needs Assistance Bed Mobility: Supine to Sit;Sit to Supine     Supine to sit: Max assist Sit to supine: Mod assist   General bed mobility comments: max assist, pt with minimal effort to sit up at EOB, assist for LEs back into bed    Transfers                      Balance Overall balance assessment: Needs assistance Sitting-balance support: Feet unsupported;Bilateral upper extremity supported;No upper extremity supported;Single extremity supported Sitting balance-[name]: Poor Sitting balance - Comments: min assist                                   ADL either performed or assessed with clinical judgement   ADL Overall ADL's : Needs assistance/impaired     Grooming: Wash/dry face;Sitting;Minimal assistance   Upper Body Bathing: Total assistance;Sitting Upper Body Bathing Details (indicate cue type and reason): washed back for pt          Lower Body Dressing: Total assistance;Bed level                       Vision       Perception     Praxis      Cognition Arousal/Alertness: Awake/alert Behavior During Therapy: Agitated Overall Cognitive Status: Impaired/Different from baseline Area of Impairment: Orientation;Attention;Memory;Following commands;Safety/judgement;Awareness;Problem solving                 Orientation Level: Disoriented to;Situation;Time Current Attention Level: Sustained Memory: Decreased short-term memory Following Commands: Follows one step commands with increased time;Follows one step commands inconsistently Safety/Judgement: Decreased awareness of safety;Decreased awareness of deficits Awareness: Intellectual Problem Solving: Slow processing;Difficulty sequencing;Requires verbal cues;Requires tactile cues;Decreased initiation General Comments: Pt continues to have paranoia about medical situation and plan. Has been told, but does not recall.        Exercises     Shoulder Instructions       General Comments      Pertinent Vitals/ Pain       Pain Assessment: Faces Faces Pain Scale: Hurts little more Pain Location: generalized discomfort Pain Descriptors / Indicators: Discomfort;Grimacing Pain Intervention(s): Monitored during session;Repositioned  Home Living  Prior Functioning/Environment              Frequency  Min 2X/week        Progress Toward Goals  OT Goals(current goals can now be found in the care plan section)  Progress towards OT goals: Not progressing toward goals - comment  Acute Rehab OT Goals Patient Stated Goal: did not state OT Goal Formulation: Patient unable to participate in goal setting Time For Goal Achievement: 03/17/21 Potential to Achieve Goals: Fair  Plan Discharge plan remains appropriate    Co-evaluation                 AM-PAC OT "6 Clicks" Daily  Activity     Outcome Measure   Help from another person eating meals?: A Little Help from another person taking care of personal grooming?: A Lot Help from another person toileting, which includes using toliet, bedpan, or urinal?: A Lot Help from another person bathing (including washing, rinsing, drying)?: A Lot Help from another person to put on and taking off regular upper body clothing?: A Little Help from another person to put on and taking off regular lower body clothing?: A Little 6 Click Score: 15    End of Session    OT Visit Diagnosis: Unsteadiness on feet (R26.81);Other abnormalities of gait and mobility (R26.89);Muscle weakness (generalized) (M62.81);History of falling (Z91.81);Pain;Other symptoms and signs involving cognitive function   Activity Tolerance Patient limited by fatigue   Patient Left in bed;with call bell/phone within reach;with bed alarm set   Nurse Communication          Time: 8921-1941 OT Time Calculation (min): 21 min  Charges: OT General Charges $OT Visit: 1 Visit OT Treatments $Therapeutic Activity: 8-22 mins  Martie Round, OTR/L Acute Rehabilitation Services Pager: [phone] Office: [phone]    Evern Bio 03/15/2021, 1:02 PM

## 2021-03-15 NOTE — Progress Notes (Signed)
Refused blood sugar check.

## 2021-03-15 NOTE — Progress Notes (Signed)
Refused blood draw.

## 2021-03-15 NOTE — Progress Notes (Signed)
     Subjective: Patient is refusing blood draws and blood sugar overnight. Today he reports that he is doing fine, but he want's someone to "tell him the truth" about where he would be going.   CSW note reviewed and possibly patient could go to [facility] at this time and sign paperwork at that facility.  Call into [facility].  Will follow up today with TOC team this afternoon.   Objective:  Vital signs in last 24 hours: Vitals:   03/15/21 0400 03/15/21 0500 03/15/21 0740 03/15/21 0846  BP: 103/64  118/71   Pulse: 81  89   Resp: 20  20   Temp: 98.2 F (36.8 C)  (!) 97.4 F (36.3 C)   TempSrc: Axillary  Oral   SpO2: 98%  100% 97%  Weight:  73.4 kg     Gen: Awake, alert, very hard of hearing Eyes: Anicteric sclerae Pulm: Breathing comfortably in bed, no wheezing Psych: No distress, aware he is in hospital  Assessment/Plan:  Complicated UTI (urinary tract infection) - resolved Indwelling PCN tube, right - capped - Antibiotics have been stopped, afebrile - If febrile, would check urine cultures, blood cultures and consider restarting antibiotics.  - Monitor for changes to hemodynamic status - Urology saw on 7/17 and recommended capping right nephrostomy tube.  Patient has been doing well since that time.   - Will do a post void residual in the morning, if below 300cc will reach back out to urology to see if tube can be removed.      Chronic kidney injury stage 3a Staghorn renal calculus - PCN tube as noted above - Renal function has been stable, patient refused labs this morning - Continue to monitor - Avoid nephrotoxic medications    Malnutrition of moderate degree - Nutrition consulted and following  Patient incapable of making informed decisions - As recommended by Psychiatry - This is complicating his discharge, however, a call to APS has been made and we are awaiting determination if this patient can go to SNF while waiting on guardianship and he can sign his own  paperwork.   Atrial Fibrillation - Rate in the 80s - Continue Eliquis  Normocytic anemia - Iron panel showed low iron, low saturation from June - Likely a mixed picture with CKD and some mild iron deficiency - Ferritin 63 - Outpatient follow up is appropriate.   Chronic heart failure with preserved ejection fraction - Seen on TTE from 2020 - Currently euvolemic without issues.   - Monitor    DVT ppx: On apixaban  Dispo: Anticipated discharge in approximately 2-3 day(s).   Inez Catalina, MD 03/15/2021, 10:35 AM

## 2021-03-15 NOTE — Plan of Care (Signed)

## 2021-03-15 NOTE — TOC Progression Note (Addendum)
Transition of Care [facility]) - Progression Note    Patient Details  Name: Alfred Mercado MRN: [id number] Date of Birth: 25-Dec-1935  Transition of Care [facility]) CM/SW Contact  Levada Schilling Phone Number: 03/15/2021, 3:49 PM  Clinical Narrative:    CSW spoke with pt at bedside.  Pt would prefer a private room at SNF. [facility] is not able to accommodate pt's preference.  CSW contacted [place].  [facility] can give pt a private room. CSW spoke with pt changing of SNF.  Pt is agreeable for SNF at [place]. [facility] will start insurance auth for pt.  [facility] admission director will speak with pt tomorrow at bedside.  TOC will continue to assist with disposition planning. 4:01pm CSW left vm for [facility] Social Worker Nash Dimmer . 4:14pm CSW received return phone call from [facility]PS Social Worker.  Ms Mayford Knife is on her way to hospital.  Ms. Mayford Knife has asked for a copy of pt's primary diagnosis.  [facility] will need this information to staff his case for his disabilities.  CSW will give a copy as requested to Ms. Williams.   Expected Discharge Plan: Skilled Nursing Facility Barriers to Discharge: Continued Medical Work up  Expected Discharge Plan and Services Expected Discharge Plan: Skilled Nursing Facility In-house Referral: Clinical Social Work, Chaplain     Living arrangements for the past 2 months: Hotel/Motel                                       Social Determinants of Health (SDOH) Interventions    Readmission Risk Interventions No flowsheet data found.

## 2021-03-16 DIAGNOSIS — I5032 Chronic diastolic (congestive) heart failure: Secondary | ICD-10-CM | POA: Diagnosis not present

## 2021-03-16 DIAGNOSIS — Z789 Other specified health status: Secondary | ICD-10-CM | POA: Diagnosis not present

## 2021-03-16 DIAGNOSIS — E440 Moderate protein-calorie malnutrition: Secondary | ICD-10-CM | POA: Diagnosis not present

## 2021-03-16 LAB — GLUCOSE, CAPILLARY: Glucose-Capillary: 112 mg/dL — ABNORMAL HIGH (ref 70–99)

## 2021-03-16 MED ORDER — VANCOMYCIN HCL IN DEXTROSE 1-5 GM/200ML-% IV SOLN
1000.0000 mg | INTRAVENOUS | Status: DC
  Administered 2021-03-16: 1000 mg via INTRAVENOUS
  Filled 2021-03-16 (×2): qty 200

## 2021-03-16 NOTE — TOC Progression Note (Signed)
Transition of Care [facility]) - Progression Note    Patient Details  Name: Alfred Mercado MRN: [id number] Date of Birth: Oct 18, 1935  [facility]) CM/SW Contact  Levada Schilling Phone Number: 03/16/2021, 1:56 PM  Clinical Narrative:    CSW was consulted by [facility]PS Social Worker, Nash Dimmer concerning request for motorized wheel chair.  [name] motorized Wheelchair is in process through [employer]. It motorized wheel chair will take months to get in. The contact for [employer] for this item is Zenia Resides.    Expected Discharge Plan: Skilled Nursing Facility Barriers to Discharge: Continued Medical Work up  Expected Discharge Plan and Services Expected Discharge Plan: Skilled Nursing Facility In-house Referral: Clinical Social Work, Chaplain     Living arrangements for the past 2 months: Hotel/Motel                                       Social Determinants of Health (SDOH) Interventions    Readmission Risk Interventions No flowsheet data found.

## 2021-03-16 NOTE — Progress Notes (Signed)
This chaplain is present for F/U spiritual care with the Pt.    The chaplain listened reflectively as the Pt. talked about this hospital admission and anticipated discharge to a SNF.  The Pt. is hopeful and willing to participate in SNF, because of his future goal of returning home.  The Pt. is hopeful his "counselor" will have the good news of discharge today.  The Pt speaks often of the medical team and their roles.  In the Pt. story telling, the chaplain recognizes successful opportunities for Pt. interaction through relationship instead of authority.   The chaplain understands the Pt. faith informs his decision making.  The Pt. expressed an interest in a faith community upon discharge. The Pt. is hopeful if not in person, virtually at the SNF.  The Pt. and chaplain shared prayer together.

## 2021-03-16 NOTE — Progress Notes (Addendum)
Pharmacy Antibiotic Note  Alfred Mercado is a 85 y.o. male admitted on 03/01/2021 with  nephrostomy tube removal 7/21 .  Pharmacy has been consulted for prophylactic vancomycin dosing.   Plan: Vancomycin 1 gram IV every 24 hours.  Goal trough 10-15 mcg/mL. One day prior to nephrostomy tube removal and two days after Monitor renal function  Weight: 71.2 kg (156 lb 15.5 oz)  Temp (24hrs), Avg:98.2 F (36.8 C), Min:97.7 F (36.5 C), Max:98.7 F (37.1 C)  Recent Labs  Lab 03/10/21 0723 03/11/21 1007 03/13/21 1047  WBC 5.5 5.2 5.3  CREATININE 1.34* 1.45* 1.36*    Estimated Creatinine Clearance: 41.4 mL/min (A) (by C-G formula based on SCr of 1.36 mg/dL (H)).    No Known Allergies  Antimicrobials this admission: Vanco 7/8>>7/11, 7/21>> Linezolid 7/11>>7/12, 7/14>7/15 Cefepime 7/7 >>7/12 Doxycycline 7/12>>7/15  Cefazolin x2 doses on 7/6  Dose adjustments this admission: none  Microbiology results: 7/6: Ucx: 40,000 Staph epi- Pan sens except R to oxacillin, clinda, septra. 7/6 Covid: neg 7/6 blood>>negF 7/12 Ucx>> negF  Thank you for allowing pharmacy to be a part of this patient's care.  Filbert Schilder, PharmD PGY1 Pharmacy Resident 03/16/2021  6:14 PM  Please check [email or web address] for unit-specific pharmacy phone numbers.

## 2021-03-16 NOTE — Plan of Care (Signed)
  Problem: Education: Goal: Knowledge of General Education information will improve Description Including pain rating scale, medication(s)/side effects and non-pharmacologic comfort measures Outcome: Progressing   Problem: Clinical Measurements: Goal: Ability to maintain clinical measurements within normal limits will improve Outcome: Progressing   Problem: Clinical Measurements: Goal: Will remain free from infection Outcome: Progressing   

## 2021-03-16 NOTE — Progress Notes (Signed)
   Subjective: No acute overnight events. Patient continues to state he does not want to talk to [place]. He states there is nothing we can do for him.   Objective:  Vital signs in last 24 hours: Vitals:   03/16/21 0211 03/16/21 0400 03/16/21 0500 03/16/21 0841  BP: 104/67 (!) 97/59 (!) 94/56 (!) 120/59  Pulse: 86 79 87 89  Resp: 16 (!) 21 17 18   Temp: 98.3 F (36.8 C) 97.7 F (36.5 C) 97.7 F (36.5 C) 98.7 F (37.1 C)  TempSrc: Oral Oral Oral Oral  SpO2:  96% 96% 97%  Weight:   71.2 kg    Physical Exam- patient refusing examination today General: Comfortably sleeping, in no acute distress HEENT: Normocephalic, atraumatic, EOM intact, conjunctiva normal Pulm: normal work of breathing, on room air Skin: Warm and dry   Assessment/Plan:  Principal Problem:   Sepsis (HCC) Active Problems:   Chronic heart failure with preserved ejection fraction (HCC)   Acute-on-chronic kidney injury (HCC)   Complicated UTI (urinary tract infection)   Staghorn renal calculus   Cellulitis   Hyponatremia   Somnolence   Patient incapable of making informed decisions   Malnutrition of moderate degree   Complicated UTI (urinary tract infection) - resolved Indwelling PCN tube, right - capped Patient has remained afebrile and without any other signs or symptoms of infection since discontinuation of antibiotics.  Repeat bladder scans have shown no signs of urinary retention since nephrostomy tube has been capped.  We will reach back out to urology today to discuss possible removal of nephrostomy tube. - Monitor for changes to hemodynamic status -We will reach back out to urology to see if nephrostomy tube can be removed    Chronic kidney injury stage 3a Staghorn renal calculus - PCN tube as noted above - Renal function has been stable, patient refused labs yesterday, will try again - Continue to monitor - Avoid nephrotoxic medications    Malnutrition of moderate degree - Nutrition consulted  and following   Patient incapable of making informed decisions - As recommended by Psychiatry - Patient is agreeable for SNF placement at - Appreciate social work's assistance with guardianship and APS   Atrial Fibrillation - Rate controlled - Continue Eliquis   Normocytic anemia - Likely a mixed picture with CKD and some mild iron deficiency - Ferritin 63 - Outpatient follow-up   Chronic heart failure with preserved ejection fraction - Seen on TTE from 2020 - Currently euvolemic without issues   - Monitor     Diet: Regular VTE prophylaxis: Apixaban Full code  Dispo: Anticipated discharge pending placement.  Shikita Vaillancourt N, DO 03/16/2021, 10:34 AM Pager: [phone] After 5pm on weekdays and 1pm on weekends: On Call pager [phone]

## 2021-03-16 NOTE — Progress Notes (Signed)
Spoke with on call Urologist who recommends removal of nephrostomy tube. Recommended antibiotic therapy one day prior to removal and two days post removal. Antibiotic therapy can be tailored from previous urine culture susceptibilities.   Plan: -Consult IR for nephrostomy tube removal -Start antibiotic therapy, will proceed with IV vancomycin -Monitor urinary output post tube removal   Jaxie Racanelli, DO PGY-3 IMTS

## 2021-03-16 NOTE — Progress Notes (Signed)
PT Cancellation Note  Patient Details Name: Alfred Mercado MRN: [id number] DOB: [birth year]   Cancelled Treatment:    Reason Eval/Treat Not Completed: Patient declined, no reason specified continues to refuse therapy. Tells me I will be the first to know when he is ready, not receptive to education on benefits of getting EOB/OOB with PT or that we are sometimes limited when we can see him due to needing +2 help for safety, which we need to schedule. Will continue efforts however may eventually need to reduce frequency if refusals continue.   Madelaine Etienne, DPT, PN1   Supplemental Physical Therapist [facility]    Pager [phone] Acute Rehab Office [phone]

## 2021-03-17 ENCOUNTER — Telehealth: Payer: Self-pay

## 2021-03-17 DIAGNOSIS — N390 Urinary tract infection, site not specified: Secondary | ICD-10-CM | POA: Diagnosis not present

## 2021-03-17 LAB — CBC WITH DIFFERENTIAL/PLATELET
Abs Immature Granulocytes: 0.06 10*3/uL (ref 0.00–0.07)
Basophils Absolute: 0.1 10*3/uL (ref 0.0–0.1)
Basophils Relative: 2 %
Eosinophils Absolute: 0.2 10*3/uL (ref 0.0–0.5)
Eosinophils Relative: 4 %
HCT: 30.4 % — ABNORMAL LOW (ref 39.0–52.0)
Hemoglobin: 9.7 g/dL — ABNORMAL LOW (ref 13.0–17.0)
Immature Granulocytes: 1 %
Lymphocytes Relative: 19 %
Lymphs Abs: 1.1 10*3/uL (ref 0.7–4.0)
MCH: 28 pg (ref 26.0–34.0)
MCHC: 31.9 g/dL (ref 30.0–36.0)
MCV: 87.6 fL (ref 80.0–100.0)
Monocytes Absolute: 0.4 10*3/uL (ref 0.1–1.0)
Monocytes Relative: 8 %
Neutro Abs: 3.9 10*3/uL (ref 1.7–7.7)
Neutrophils Relative %: 66 %
Platelets: 237 10*3/uL (ref 150–400)
RBC: 3.47 MIL/uL — ABNORMAL LOW (ref 4.22–5.81)
RDW: 20.2 % — ABNORMAL HIGH (ref 11.5–15.5)
WBC: 5.8 10*3/uL (ref 4.0–10.5)
nRBC: 0 % (ref 0.0–0.2)

## 2021-03-17 LAB — RESP PANEL BY RT-PCR (FLU A&B, COVID) ARPGX2
Influenza A by PCR: NEGATIVE
Influenza B by PCR: NEGATIVE
SARS Coronavirus 2 by RT PCR: NEGATIVE

## 2021-03-17 LAB — BASIC METABOLIC PANEL
Anion gap: 9 (ref 5–15)
BUN: 42 mg/dL — ABNORMAL HIGH (ref 8–23)
CO2: 22 mmol/L (ref 22–32)
Calcium: 9.1 mg/dL (ref 8.9–10.3)
Chloride: 104 mmol/L (ref 98–111)
Creatinine, Ser: 1.57 mg/dL — ABNORMAL HIGH (ref 0.61–1.24)
GFR, Estimated: 43 mL/min — ABNORMAL LOW (ref >60)
Glucose, Bld: 117 mg/dL — ABNORMAL HIGH (ref 70–99)
Potassium: 4.9 mmol/L (ref 3.5–5.1)
Sodium: 135 mmol/L (ref 135–145)

## 2021-03-17 LAB — GLUCOSE, CAPILLARY: Glucose-Capillary: 95 mg/dL (ref 70–99)

## 2021-03-17 MED ORDER — CIPROFLOXACIN HCL 500 MG PO TABS
500.0000 mg | ORAL_TABLET | Freq: Once | ORAL | Status: AC
  Administered 2021-03-17: 500 mg via ORAL
  Filled 2021-03-17: qty 1

## 2021-03-17 MED ORDER — CIPROFLOXACIN HCL 500 MG PO TABS: 500.0000 mg | ORAL_TABLET | Freq: Two times a day (BID) | ORAL | 0 refills | Status: AC

## 2021-03-17 MED ORDER — SODIUM BICARBONATE 650 MG PO TABS: 650.0000 mg | ORAL_TABLET | Freq: Two times a day (BID) | ORAL | Status: DC

## 2021-03-17 NOTE — Discharge Summary (Addendum)
Name: Alfred Mercado MRN: [id number] DOB: 06-29-1936 85 y.o. PCP: Alfred Register, MD  Date of Admission: 03/01/2021  1:35 PM Date of Discharge: 03/17/2021 Attending Physician: Alfred Catalina, MD  Discharge Diagnosis: Sepsis  Purulent cellulitis from right nephrostomy tube insertion site Complicated UTI, staph epidermis Chronic kidney disease stage IIIa Staghorn renal calculi Moderate degree malnutrition Atrial fibrillation Pleural effusion status postthoracentesis  Capacity HFpEF  Discharge Medications: Allergies as of 03/17/2021   No Known Allergies      Medication List     TAKE these medications    acetaminophen 325 MG tablet Commonly known as: TYLENOL Take 650 mg by mouth at bedtime.   albuterol 108 (90 Base) MCG/ACT inhaler Commonly known as: VENTOLIN HFA Inhale 2 puffs into the lungs every 6 (six) hours as needed for wheezing or shortness of breath.   apixaban 2.5 MG Tabs tablet Commonly known as: ELIQUIS Take 1 tablet (2.5 mg total) by mouth 2 (two) times daily.   ciprofloxacin 500 MG tablet Commonly known as: Cipro Take 1 tablet (500 mg total) by mouth 2 (two) times daily for 3 days.   diltiazem 120 MG 24 hr capsule Commonly known as: CARDIZEM CD Take 120 mg by mouth daily.   Dulcolax 5 MG EC tablet Generic drug: bisacodyl Take 20 mg by mouth daily as needed for moderate constipation.   furosemide 40 MG tablet Commonly known as: Lasix Take 1 tablet (40 mg total) by mouth daily.   hydrOXYzine 10 MG tablet Commonly known as: ATARAX/VISTARIL Take 1 tablet (10 mg total) by mouth 3 (three) times daily as needed for itching.   metoprolol tartrate 25 MG tablet Commonly known as: LOPRESSOR TAKE 1 TABLET(25 MG) BY MOUTH TWICE DAILY What changed:  how much to take how to take this when to take this additional instructions   Misc. Devices Misc Power wheelchair.  Diagnosis atrial fibrillation, gait abnormality   nicotine 7 mg/24hr  patch Commonly known as: NICODERM CQ - dosed in mg/24 hr Place 1 patch (7 mg total) onto the skin daily at 6 (six) AM.   OVER THE COUNTER MEDICATION Take 1-2 tablets by mouth at bedtime. Sleeping pill   polyethylene glycol powder 17 GM/SCOOP powder Commonly known as: GLYCOLAX/MIRALAX Take 17 g by mouth daily as needed for mild constipation.   Spiriva Respimat 2.5 MCG/ACT Aers Generic drug: Tiotropium Bromide Monohydrate INHALE 2 PUFFS BY MOUTH DAILY What changed:  how much to take how to take this when to take this additional instructions   tamsulosin 0.4 MG Caps capsule Commonly known as: FLOMAX Take 1 capsule (0.4 mg total) by mouth daily.   Vitamin D (Ergocalciferol) 1.25 MG (50000 UNIT) Caps capsule Commonly known as: DRISDOL TAKE 1 CAPSULE BY MOUTH 1 TIME A WEEK What changed: See the new instructions.        Disposition and follow-up:   Alfred Mercado was discharged from [facility] in Stable condition.  At the hospital follow up visit please address:  1.  Complicated UTI-patient presented septic due to complicated UTI and purulent cellulitis of nephrostomy tube insertion site.  Improved with IV antibiotics.  Nephrostomy tube is capped and patient continues to urinate well.  Tube was removed today of discharge.  Please monitor for signs of urinary retention and infection.  Patient to continue antibiotics for 2 additional days after removal.  2.  Labs / imaging needed at time of follow-up: None  3.  Pending labs/ test needing follow-up: None  Follow-up Appointments:  Contact information for after-discharge care     Destination     [facility] .   Service: Skilled Nursing Contact information: [address] [place] [address] [phone]                     Hospital Course by problem list:  Alfred Mercado is an 85 year old with past medical history of HFpEF, Atrial Fibrillation, Pleural Effusion,  tobacco use, iron deficiency anemia, COPD, CKD3, recent history of complicated UTI with perinephric abscess, staghorn calculi with right percutaneous nephrostomy tube placed 6/1 and recent admission with dehydration/ hypotension on 6/17.  Admitted from 7/7-7/22 sepsis secondary to nonpurulent cellulitis of the nephrostomy tube insertion site and a complicated UTI.   Nonpurulent cellulitis of nephrostomy tube insertion site, resolved Complicated UTI, staph epidermis, resolved Removal of right percutaneous nephrostomy tube Patient presented with sepsis and a complicated UTI which was treated with IV antibiotics and resolved.  Treated with linezolid.  Urology was following and recommended trial of capping nephrostomy tube.  Patient to continue to have good urinary output and no signs of infection.  Follow-up urine and blood cultures were negative.  Nephrostomy tube was removed day of discharge.  Patient continued to have good urinary output.  If patient becomes infected and or obstructive we will need percutaneous nephrostomy tube to be replaced.  To follow-up with outpatient with urology.  Discharged with 3 days of ciprofloxacin.   Right staghorn calculus Patient has significant stone burden on the left side per CT abdomen.  Urology reviewed unfortunately patient is not a surgical candidate.  To follow-up outpatient.  Continue Flomax daily.  Patient incapable of making informed decisions Psychiatry consulted and determined patient lacks capacity to make informed medical decisions.  Social work assisted with filing for guardianship and APS.  Acute Kidney Injury on CKD IIIa Initially presented with elevated creatinine likely of prerenal etiology.  Improved with IV fluids.  Renal function remained stable.    Persistent A. Fib Remained rate controlled.  Continue home Eliquis and metoprolol once his pressures improved.     History of recurrent falls while on anticoagulation. No focal neurologic deficits  on admission.  CT head unremarkable.   Normocytic anemia Remained stable during hospitalization.  Likely mixed picture with CKD and some mild iron deficiency.  Outpatient follow-up.   HFpEF Remained euvolemic during hospitalization.  Resumed home medications at discharge.   Moderate degree of malnutrition Nutrition was consulted and recommended Ensure Enlive po TID, each supplement provides 350 kcal and 20 grams of protein; MVI with minerals daily and Magic cup BID with meals, each supplement provides 290 kcal and 9 grams of protein.   Discharge Exam:   BP 103/72 (BP Location: Right Arm)   Pulse 79   Temp 98.2 F (36.8 C) (Oral)   Resp (!) 22   Wt 71.2 kg   SpO2 98%   BMI 20.71 kg/m  Discharge exam:  Patient refused physical examination. Physical Exam General: alert, appears stated age, in no acute distress HEENT: Normocephalic, atraumatic, EOM intact, conjunctiva normal Pulm: Normal work of breathing, on room air  Pertinent Labs, Studies, and Procedures:   CBC Latest Ref Rng & Units 03/17/2021 03/13/2021 03/11/2021  WBC 4.0 - 10.5 K/uL 5.8 5.3 5.2  Hemoglobin 13.0 - 17.0 g/dL 6.7(E) 9.3(Y) 1.0(F)  Hematocrit 39.0 - 52.0 % 30.4(L) 28.4(L) 28.0(L)  Platelets 150 - 400 K/uL 237 220 221   BMP Latest Ref Rng & Units  03/17/2021 03/13/2021 03/11/2021  Glucose 70 - 99 mg/dL 960(A117(H) 540(J100(H) 811(B128(H)  BUN 8 - 23 mg/dL 14(N42(H) 82(N29(H) 22  Creatinine 0.61 - 1.24 mg/dL 5.62(Z1.57(H) 3.08(M1.36(H) 5.78(I1.45(H)  BUN/Creat Ratio 10 - 24 - - -  Sodium 135 - 145 mmol/L 135 135 136  Potassium 3.5 - 5.1 mmol/L 4.9 4.0 4.0  Chloride 98 - 111 mmol/L 104 103 110  CO2 22 - 32 mmol/L 22 25 22   Calcium 8.9 - 10.3 mg/dL 9.1 9.0 6.9(G8.7(L)   CT Abdomen Pelvis Wo Contrast  Result Date: 03/01/2021 CLINICAL DATA:  Abdominal pain, right nephrostomy tube regress EXAM: CT ABDOMEN AND PELVIS WITHOUT CONTRAST TECHNIQUE: Multidetector CT imaging of the abdomen and pelvis was performed following the standard protocol without IV contrast.  COMPARISON:  02/11/2021 FINDINGS: Lower chest: Small right pleural effusion, partially loculated, improved. Rounded atelectasis in the lateral right lower lung and right lower lobe. Hepatobiliary: Unenhanced liver is notable for a 2.3 cm central left hepatic lobe cyst (series 3/image 18). Gallbladder is notable for layering sludge (series 3/image 33). No associated inflammatory changes. No intrahepatic or extrahepatic ductal dilatation. Pancreas: Within normal limits. Spleen: Within normal limits. Adrenals/Urinary Tract: Adrenal glands are within normal limits. Right kidney is notable for an indwelling percutaneous nephrostomy with fragmented staghorn calculus, including a dominant 4.7 cm calculus in the right proximal collecting system (coronal image 92). No hydronephrosis. Left kidney is notable for a 16 mm posterior left upper pole calculus (series 3/image 42) and an overlying 4.1 cm localized perinephric collection in the setting of prior intervention (series 3/image 45). No hydronephrosis. Bladder is within normal limits. Stomach/Bowel: Stomach is within normal limits. No evidence of bowel obstruction. Appendix is not discretely visualized. Vascular/Lymphatic: No evidence of abdominal aortic aneurysm. Atherosclerotic calcifications of the abdominal aorta and branch vessels. No suspicious abdominopelvic lymphadenopathy. Reproductive: Prostate is notable for dystrophic calcifications. Other: No abdominopelvic ascites. Prior presacral fluid/stranding has resolved. Small fat containing left inguinal hernia. Musculoskeletal: Moderate compression fracture deformity at L1, stable. Mild degenerative changes of the visualized thoracolumbar spine. IMPRESSION: Indwelling right percutaneous nephrostomy. Fragmented right staghorn calculus, including a dominant 4.7 cm calculus in the right proximal collecting system. No hydronephrosis. 16 mm posterior left upper pole renal calculus.  No hydronephrosis. Small loculated right  pleural effusion, improved. Electronically Signed   By: Charline BillsSriyesh  Krishnan M.D.   On: 03/01/2021 19:39   CT HEAD WO CONTRAST  Result Date: 03/02/2021 CLINICAL DATA:  Altered mental status with hemorrhage suspected EXAM: CT HEAD WITHOUT CONTRAST TECHNIQUE: Contiguous axial images were obtained from the base of the skull through the vertex without intravenous contrast. COMPARISON:  01/24/2021 FINDINGS: Brain: No evidence of acute infarction, hemorrhage, hydrocephalus, extra-axial collection or mass lesion/mass effect. Generalized atrophy. Mildly asymmetric CSF spaces attributed to decubitus positioning. Age congruent white matter appearance Vascular: No hyperdense vessel or unexpected calcification. Skull: Normal. Negative for fracture or focal lesion. Sinuses/Orbits: No acute finding. IMPRESSION: 1. No acute finding or change from May 2022. 2. Generalized atrophy. Electronically Signed   By: Marnee SpringJonathon  Watts M.D.   On: 03/02/2021 08:36   DG Chest Port 1 View  Result Date: 03/01/2021 CLINICAL DATA:  Sepsis. Purulence discharge around nephrostomy tube. EXAM: PORTABLE CHEST 1 VIEW COMPARISON:  01/24/21 FINDINGS: Stable cardiomediastinal contours. Left lung is clear. Partially loculated right pleural effusion with overlying atelectasis is again identified right upper lobe appears clear. IMPRESSION: Unchanged appearance of right lung base with partially loculated pleural effusion and overlying atelectasis. Electronically Signed   By: Ladona Ridgelaylor  Bradly Chris M.D.   On: 03/01/2021 17:04     Discharge Instructions: Discharge Instructions     Call MD for:  redness, tenderness, or signs of infection (pain, swelling, redness, odor or green/yellow discharge around incision site)   Complete by: As directed    Call MD for:  temperature >100.4   Complete by: As directed    No wound care   Complete by: As directed        Signed: Laketha Leopard, Callie Fielding, DO 03/17/2021, 12:29 PM

## 2021-03-17 NOTE — Progress Notes (Signed)
PT Cancellation Note  Patient Details Name: Alfred Mercado MRN: [id number] DOB: [birth year]   Cancelled Treatment:    Reason Eval/Treat Not Completed: Patient declined, no reason specified.  Began to ask questions about SNF, left him with nursing as pt was mildly agitated.     Ivar Drape 03/17/2021, 1:53 PM  Samul Dada, PT MS Acute Rehab Dept. Number: [facility] [id number] and [facility] [phone]

## 2021-03-17 NOTE — Progress Notes (Signed)
This chaplain responded to the Pt. hallway request for spiritual care.    The Pt. expressed his anxiety around being discharged and transported by ambulance to a "nursing home".  The Pt. is requesting communication with the "counselor".

## 2021-03-17 NOTE — Telephone Encounter (Signed)
Done

## 2021-03-17 NOTE — TOC Transition Note (Signed)
Transition of Care [facility]) - CM/SW Discharge Note   Patient Details  Name: Alfred Mercado MRN: [id number] Date of Birth: 03-Jan-1936  Transition of Care [facility]) CM/SW Contact:  Mearl Latin, LCSW Phone Number: 03/17/2021, 4:14 PM   Clinical Narrative:    Patient will DC to: [facility] Anticipated DC date: 03/17/21 Family notified: Pt notified friends and APS aware Transport by: Sharin Mons   Per MD patient ready for DC to [facility]. RN to call report prior to discharge [phone]). RN, patient, patient's family, and facility notified of DC. Discharge Summary and FL2 sent to facility. DC packet on chart. Ambulance transport requested for patient.   CSW will sign off for now as social work intervention is no longer needed. Please consult [place] again if new needs arise.     Final next level of care: Skilled Nursing Facility Barriers to Discharge: Barriers Resolved   Patient Goals and CMS Choice Patient states their goals for this hospitalization and ongoing recovery are:: Rest CMS Medicare.gov Compare Post Acute Care list provided to:: Patient Choice offered to / list presented to : Patient  Discharge Placement   Existing PASRR number confirmed : 03/17/21          Patient [facility] bed at: [facility] Patient to be transferred to facility by: [facility] Name of family member notified: APS Patient and family notified of of transfer: 03/17/21  Discharge Plan and Services In-house Referral: Clinical Social Work, Orthoptist                                   Social Determinants of Health (SDOH) Interventions     Readmission Risk Interventions Readmission Risk Prevention Plan 03/17/2021  Transportation Screening Complete  Medication Review Oceanographer) Referral to Pharmacy  PCP or Specialist appointment within 3-5 days of discharge Complete  HRI or Home Care Consult Complete  SW Recovery Care/Counseling Consult Complete  Palliative Care Screening Not Applicable  Skilled  Nursing Facility Complete  Some recent data might be hidden

## 2021-03-17 NOTE — Progress Notes (Signed)
IR called to remove (R)PCN Originally placed on 6/1 for large staghorn stone with hydronephrosis. Last exchanged on 7/11 Has done well with conservative management. Last imaging showed no residual hydronephrosis. Ultimately not a candidate for PCN. Decision was made to do capping trial. Pt has done well with capping trial. Minimal residual bladder volume on post void bladder scans.  Urology recommends PCN be removed.   (R)PCN intact, site clean, NT Tube removed without difficulty. Dry dressing applied. Care plan per primary and Urology   Brayton El PA-C Interventional Radiology 03/17/2021 10:22 AM

## 2021-03-17 NOTE — Progress Notes (Signed)
Spoke with Dr. Karilyn Cota yesterday.  Discussed prophylactic antibiotics and removing PCN tube as a trial.  He has tolerated PCN tube being capped for several days with low post void residuals. Will need outpatient follow up.  If patient becomes infected and/or obstructed will need PCN to be replaced.

## 2021-03-17 NOTE — Consult Note (Addendum)
   [facility] Inpatient Consult   03/17/2021  EBERT FORRESTER 04/02/1936 [id number]  [facility] [[facility]]  Accountable Care Organization [ACO] Patient Medicare CMS DCE  Addendum:  If patient transitions to a [facility] affiliated facility, this Clinical research associate can make a referral to be sent for patient to be followed by [facility] [[facility]] Care Management PAC RN with traditional Medicare for any known or needs for transitional care needs for returning to post facility care or complex disease management.   Spoke with the patient via hospital phone to let him know a [facility] RN is to follow up will he is at rehab, if he goes to one associated with [facility]. Told him this writer will follow up with TOC team to check.  1440 - Addendum:  Patient is currently scheduled to go to a non-[facility] affiliated facility.  For questions or referrals, please contact:   Charlesetta Shanks, RN BSN CCM [facility]  [phone] business mobile phone Toll free office [phone]  Fax number: [phone] [email or web address]

## 2021-03-17 NOTE — TOC Progression Note (Signed)
Transition of Care [facility]) - Progression Note    Patient Details  Name: Alfred Mercado MRN: [id number] Date of Birth: 10/12/1935  Transition of Care [facility]) CM/SW Contact  Mearl Latin, LCSW Phone Number: 03/17/2021, 9:20 AM  Clinical Narrative:    CSW spoke with Malena Peer at [facility] regarding when they can accept patient. He will contact CSW back in a few minutes as he is in the car.    Expected Discharge Plan: Skilled Nursing Facility Barriers to Discharge: Continued Medical Work up  Expected Discharge Plan and Services Expected Discharge Plan: Skilled Nursing Facility In-house Referral: Clinical Social Work, Chaplain     Living arrangements for the past 2 months: Hotel/Motel                                       Social Determinants of Health (SDOH) Interventions    Readmission Risk Interventions No flowsheet data found.

## 2021-03-17 NOTE — TOC Progression Note (Addendum)
Transition of Care [facility]) - Progression Note    Patient Details  Name: Alfred Mercado MRN: [id number] Date of Birth: October 18, 1935  [facility] CM/SW Contact  Mearl Latin, LCSW Phone Number: 03/17/2021, 3:19 PM  Clinical Narrative:    CSW spoke with patient and allayed his fears about the term "nursing home". CSW explained that patient will only be going to rehab, though the facility houses both rehab patient and long term care patients. Patient was able to repeat that back to CSW and promised he would agree to work with therapy there. He reported agreement with going to rehab for up to 20 days and then back to the [facility], which is his primary goal. Patient contacted 2 front desk workers at the [facility] with CSW present and let them know of his plans. CSW left voicemail for APS as requested by patient since they are supposed to be helping set up aide services once he is back at the motel. No other needs identified at this time.   Update: APS Worker Ms. Mayford Knife returned call and stated she will be visiting patient next week at [place] and will follow up on the motorized wheelchair from [employer]. CSW provided [employer] with Ms. Williams Lexicographer.    Expected Discharge Plan: Skilled Nursing Facility Barriers to Discharge: Barriers Resolved  Expected Discharge Plan and Services Expected Discharge Plan: Skilled Nursing Facility In-house Referral: Clinical Social Work, Chaplain     Living arrangements for the past 2 months: Hotel/Motel Expected Discharge Date: 03/17/21                                     Social Determinants of Health (SDOH) Interventions    Readmission Risk Interventions Readmission Risk Prevention Plan 03/17/2021  Transportation Screening Complete  Medication Review Oceanographer) Referral to Pharmacy  PCP or Specialist appointment within 3-5 days of discharge Complete  HRI or Home Care Consult Complete  SW Recovery Care/Counseling  Consult Complete  Palliative Care Screening Not Applicable  Skilled Nursing Facility Complete  Some recent data might be hidden

## 2021-03-25 ENCOUNTER — Other Ambulatory Visit: Payer: Self-pay | Admitting: Family Medicine

## 2021-03-25 NOTE — Telephone Encounter (Signed)
Requested medications are due for refill today yes  Requested medications are on the active medication list yes  Last refill 6/30  Last visit 02/2021  Future visit scheduled no  Notes to clinic Historical Provider

## 2021-05-17 ENCOUNTER — Other Ambulatory Visit: Payer: Self-pay | Admitting: Family Medicine

## 2021-05-17 DIAGNOSIS — E559 Vitamin D deficiency, unspecified: Secondary | ICD-10-CM

## 2021-05-17 NOTE — Telephone Encounter (Signed)
Requested medications are due for refill today.  yes  Requested medications are on the active medications list.  yes  Last refill. 02/24/2021  Future visit scheduled.   no  Notes to clinic.  Medication not delegated.

## 2021-06-26 ENCOUNTER — Inpatient Hospital Stay (HOSPITAL_COMMUNITY)
Admission: EM | Admit: 2021-06-26 | Discharge: 2021-07-06 | DRG: 871 | Disposition: A | Discharge status: Hospice | Payer: Medicare Other | Attending: Internal Medicine | Admitting: Internal Medicine

## 2021-06-26 ENCOUNTER — Emergency Department (HOSPITAL_COMMUNITY): Payer: Medicare Other

## 2021-06-26 ENCOUNTER — Inpatient Hospital Stay (HOSPITAL_COMMUNITY): Payer: Medicare Other

## 2021-06-26 DIAGNOSIS — I482 Chronic atrial fibrillation, unspecified: Secondary | ICD-10-CM | POA: Diagnosis present

## 2021-06-26 DIAGNOSIS — R531 Weakness: Secondary | ICD-10-CM | POA: Diagnosis not present

## 2021-06-26 DIAGNOSIS — R64 Cachexia: Secondary | ICD-10-CM | POA: Diagnosis present

## 2021-06-26 DIAGNOSIS — E8729 Other acidosis: Secondary | ICD-10-CM | POA: Diagnosis present

## 2021-06-26 DIAGNOSIS — E861 Hypovolemia: Secondary | ICD-10-CM

## 2021-06-26 DIAGNOSIS — N189 Chronic kidney disease, unspecified: Secondary | ICD-10-CM | POA: Diagnosis present

## 2021-06-26 DIAGNOSIS — Z7189 Other specified counseling: Secondary | ICD-10-CM | POA: Diagnosis not present

## 2021-06-26 DIAGNOSIS — E870 Hyperosmolality and hypernatremia: Secondary | ICD-10-CM | POA: Diagnosis present

## 2021-06-26 DIAGNOSIS — F17200 Nicotine dependence, unspecified, uncomplicated: Secondary | ICD-10-CM | POA: Diagnosis present

## 2021-06-26 DIAGNOSIS — N179 Acute kidney failure, unspecified: Secondary | ICD-10-CM | POA: Diagnosis present

## 2021-06-26 DIAGNOSIS — N390 Urinary tract infection, site not specified: Secondary | ICD-10-CM | POA: Diagnosis present

## 2021-06-26 DIAGNOSIS — I248 Other forms of acute ischemic heart disease: Secondary | ICD-10-CM | POA: Diagnosis present

## 2021-06-26 DIAGNOSIS — R778 Other specified abnormalities of plasma proteins: Secondary | ICD-10-CM

## 2021-06-26 DIAGNOSIS — Z66 Do not resuscitate: Secondary | ICD-10-CM | POA: Diagnosis not present

## 2021-06-26 DIAGNOSIS — N1832 Chronic kidney disease, stage 3b: Secondary | ICD-10-CM | POA: Diagnosis present

## 2021-06-26 DIAGNOSIS — F0390 Unspecified dementia without behavioral disturbance: Secondary | ICD-10-CM | POA: Diagnosis present

## 2021-06-26 DIAGNOSIS — A419 Sepsis, unspecified organism: Secondary | ICD-10-CM | POA: Diagnosis present

## 2021-06-26 DIAGNOSIS — U071 COVID-19: Secondary | ICD-10-CM | POA: Diagnosis not present

## 2021-06-26 DIAGNOSIS — G9341 Metabolic encephalopathy: Secondary | ICD-10-CM | POA: Diagnosis present

## 2021-06-26 DIAGNOSIS — D509 Iron deficiency anemia, unspecified: Secondary | ICD-10-CM | POA: Diagnosis not present

## 2021-06-26 DIAGNOSIS — Z515 Encounter for palliative care: Secondary | ICD-10-CM

## 2021-06-26 DIAGNOSIS — Z681 Body mass index (BMI) 19 or less, adult: Secondary | ICD-10-CM

## 2021-06-26 DIAGNOSIS — E860 Dehydration: Secondary | ICD-10-CM | POA: Diagnosis present

## 2021-06-26 DIAGNOSIS — R195 Other fecal abnormalities: Secondary | ICD-10-CM | POA: Diagnosis not present

## 2021-06-26 DIAGNOSIS — J690 Pneumonitis due to inhalation of food and vomit: Secondary | ICD-10-CM | POA: Diagnosis present

## 2021-06-26 DIAGNOSIS — I9589 Other hypotension: Secondary | ICD-10-CM

## 2021-06-26 DIAGNOSIS — Z87442 Personal history of urinary calculi: Secondary | ICD-10-CM

## 2021-06-26 DIAGNOSIS — Z8616 Personal history of COVID-19: Secondary | ICD-10-CM

## 2021-06-26 DIAGNOSIS — J189 Pneumonia, unspecified organism: Secondary | ICD-10-CM | POA: Diagnosis not present

## 2021-06-26 DIAGNOSIS — Z7901 Long term (current) use of anticoagulants: Secondary | ICD-10-CM

## 2021-06-26 DIAGNOSIS — R6520 Severe sepsis without septic shock: Secondary | ICD-10-CM | POA: Diagnosis present

## 2021-06-26 DIAGNOSIS — R410 Disorientation, unspecified: Secondary | ICD-10-CM

## 2021-06-26 DIAGNOSIS — D631 Anemia in chronic kidney disease: Secondary | ICD-10-CM | POA: Diagnosis present

## 2021-06-26 DIAGNOSIS — I959 Hypotension, unspecified: Secondary | ICD-10-CM | POA: Diagnosis present

## 2021-06-26 DIAGNOSIS — I130 Hypertensive heart and chronic kidney disease with heart failure and stage 1 through stage 4 chronic kidney disease, or unspecified chronic kidney disease: Secondary | ICD-10-CM | POA: Diagnosis present

## 2021-06-26 DIAGNOSIS — R0602 Shortness of breath: Secondary | ICD-10-CM | POA: Diagnosis not present

## 2021-06-26 DIAGNOSIS — E876 Hypokalemia: Secondary | ICD-10-CM | POA: Diagnosis present

## 2021-06-26 DIAGNOSIS — E878 Other disorders of electrolyte and fluid balance, not elsewhere classified: Secondary | ICD-10-CM | POA: Diagnosis present

## 2021-06-26 DIAGNOSIS — I5032 Chronic diastolic (congestive) heart failure: Secondary | ICD-10-CM | POA: Diagnosis not present

## 2021-06-26 DIAGNOSIS — E43 Unspecified severe protein-calorie malnutrition: Secondary | ICD-10-CM | POA: Diagnosis not present

## 2021-06-26 DIAGNOSIS — R54 Age-related physical debility: Secondary | ICD-10-CM | POA: Diagnosis present

## 2021-06-26 DIAGNOSIS — Z79899 Other long term (current) drug therapy: Secondary | ICD-10-CM

## 2021-06-26 LAB — PROTIME-INR
INR: 1.5 — ABNORMAL HIGH (ref 0.8–1.2)
Prothrombin Time: 18.5 seconds — ABNORMAL HIGH (ref 11.4–15.2)

## 2021-06-26 LAB — CBC WITH DIFFERENTIAL/PLATELET
Abs Immature Granulocytes: 0.05 10*3/uL (ref 0.00–0.07)
Basophils Absolute: 0.1 10*3/uL (ref 0.0–0.1)
Basophils Relative: 1 %
Eosinophils Absolute: 0.1 10*3/uL (ref 0.0–0.5)
Eosinophils Relative: 1 %
HCT: 28.5 % — ABNORMAL LOW (ref 39.0–52.0)
Hemoglobin: 9.6 g/dL — ABNORMAL LOW (ref 13.0–17.0)
Immature Granulocytes: 1 %
Lymphocytes Relative: 11 %
Lymphs Abs: 0.9 10*3/uL (ref 0.7–4.0)
MCH: 29.1 pg (ref 26.0–34.0)
MCHC: 33.7 g/dL (ref 30.0–36.0)
MCV: 86.4 fL (ref 80.0–100.0)
Monocytes Absolute: 0.8 10*3/uL (ref 0.1–1.0)
Monocytes Relative: 11 %
Neutro Abs: 5.7 10*3/uL (ref 1.7–7.7)
Neutrophils Relative %: 75 %
Platelets: 297 10*3/uL (ref 150–400)
RBC: 3.3 MIL/uL — ABNORMAL LOW (ref 4.22–5.81)
RDW: 16.1 % — ABNORMAL HIGH (ref 11.5–15.5)
WBC: 7.5 10*3/uL (ref 4.0–10.5)
nRBC: 0 % (ref 0.0–0.2)

## 2021-06-26 LAB — PREPARE RBC (CROSSMATCH)

## 2021-06-26 LAB — URINALYSIS, ROUTINE W REFLEX MICROSCOPIC
Bilirubin Urine: NEGATIVE
Glucose, UA: NEGATIVE mg/dL
Ketones, ur: NEGATIVE mg/dL
Nitrite: NEGATIVE
Protein, ur: NEGATIVE mg/dL
RBC / HPF: >50 RBC/hpf — ABNORMAL HIGH (ref 0–5)
Specific Gravity, Urine: 1.009 (ref 1.005–1.030)
WBC, UA: >50 WBC/hpf — ABNORMAL HIGH (ref 0–5)
pH: 6 (ref 5.0–8.0)

## 2021-06-26 LAB — COMPREHENSIVE METABOLIC PANEL
ALT: 16 U/L (ref 0–44)
AST: 18 U/L (ref 15–41)
Albumin: 2.6 g/dL — ABNORMAL LOW (ref 3.5–5.0)
Alkaline Phosphatase: 69 U/L (ref 38–126)
Anion gap: 9 (ref 5–15)
BUN: 47 mg/dL — ABNORMAL HIGH (ref 8–23)
CO2: 17 mmol/L — ABNORMAL LOW (ref 22–32)
Calcium: 12.7 mg/dL — ABNORMAL HIGH (ref 8.9–10.3)
Chloride: 110 mmol/L (ref 98–111)
Creatinine, Ser: 3.11 mg/dL — ABNORMAL HIGH (ref 0.61–1.24)
GFR, Estimated: 19 mL/min — ABNORMAL LOW (ref >60)
Glucose, Bld: 156 mg/dL — ABNORMAL HIGH (ref 70–99)
Potassium: 2.7 mmol/L — CL (ref 3.5–5.1)
Sodium: 136 mmol/L (ref 135–145)
Total Bilirubin: 0.3 mg/dL (ref 0.3–1.2)
Total Protein: 5.5 g/dL — ABNORMAL LOW (ref 6.5–8.1)

## 2021-06-26 LAB — I-STAT VENOUS BLOOD GAS, ED
Acid-base deficit: 11 mmol/L — ABNORMAL HIGH (ref 0.0–2.0)
Bicarbonate: 14.4 mmol/L — ABNORMAL LOW (ref 20.0–28.0)
Calcium, Ion: 1.66 mmol/L (ref 1.15–1.40)
HCT: 19 % — ABNORMAL LOW (ref 39.0–52.0)
Hemoglobin: 6.5 g/dL — CL (ref 13.0–17.0)
O2 Saturation: 97 %
Potassium: 2.5 mmol/L — CL (ref 3.5–5.1)
Sodium: 140 mmol/L (ref 135–145)
TCO2: 15 mmol/L — ABNORMAL LOW (ref 22–32)
pCO2, Ven: 29.9 mmHg — ABNORMAL LOW (ref 44.0–60.0)
pH, Ven: 7.29 (ref 7.250–7.430)
pO2, Ven: 102 mmHg — ABNORMAL HIGH (ref 32.0–45.0)

## 2021-06-26 LAB — CBC
HCT: 21.7 % — ABNORMAL LOW (ref 39.0–52.0)
Hemoglobin: 7.3 g/dL — ABNORMAL LOW (ref 13.0–17.0)
MCH: 29.2 pg (ref 26.0–34.0)
MCHC: 33.6 g/dL (ref 30.0–36.0)
MCV: 86.8 fL (ref 80.0–100.0)
Platelets: 236 10*3/uL (ref 150–400)
RBC: 2.5 MIL/uL — ABNORMAL LOW (ref 4.22–5.81)
RDW: 16.1 % — ABNORMAL HIGH (ref 11.5–15.5)
WBC: 5.4 10*3/uL (ref 4.0–10.5)
nRBC: 0 % (ref 0.0–0.2)

## 2021-06-26 LAB — RESP PANEL BY RT-PCR (FLU A&B, COVID) ARPGX2
Influenza A by PCR: NEGATIVE
Influenza B by PCR: NEGATIVE
SARS Coronavirus 2 by RT PCR: POSITIVE — AB

## 2021-06-26 LAB — APTT: aPTT: 31 seconds (ref 24–36)

## 2021-06-26 LAB — BASIC METABOLIC PANEL
Anion gap: 5 (ref 5–15)
BUN: 42 mg/dL — ABNORMAL HIGH (ref 8–23)
CO2: 20 mmol/L — ABNORMAL LOW (ref 22–32)
Calcium: 12.3 mg/dL — ABNORMAL HIGH (ref 8.9–10.3)
Chloride: 112 mmol/L — ABNORMAL HIGH (ref 98–111)
Creatinine, Ser: 2.69 mg/dL — ABNORMAL HIGH (ref 0.61–1.24)
GFR, Estimated: 23 mL/min — ABNORMAL LOW (ref >60)
Glucose, Bld: 102 mg/dL — ABNORMAL HIGH (ref 70–99)
Potassium: 3 mmol/L — ABNORMAL LOW (ref 3.5–5.1)
Sodium: 137 mmol/L (ref 135–145)

## 2021-06-26 LAB — PHOSPHORUS: Phosphorus: 4 mg/dL (ref 2.5–4.6)

## 2021-06-26 LAB — AMMONIA: Ammonia: 21 umol/L (ref 9–35)

## 2021-06-26 LAB — PROCALCITONIN: Procalcitonin: <0.1 ng/mL

## 2021-06-26 LAB — LACTIC ACID, PLASMA
Lactic Acid, Venous: 2 mmol/L (ref 0.5–1.9)
Lactic Acid, Venous: 1.1 mmol/L (ref 0.5–1.9)

## 2021-06-26 LAB — POC OCCULT BLOOD, ED: Fecal Occult Bld: POSITIVE — AB

## 2021-06-26 LAB — MAGNESIUM: Magnesium: 2 mg/dL (ref 1.7–2.4)

## 2021-06-26 LAB — ABO/RH: ABO/RH(D): O POS

## 2021-06-26 LAB — CK: Total CK: 8 U/L — ABNORMAL LOW (ref 49–397)

## 2021-06-26 LAB — TROPONIN I (HIGH SENSITIVITY): Troponin I (High Sensitivity): 35 ng/L — ABNORMAL HIGH (ref <18)

## 2021-06-26 MED ORDER — POTASSIUM CHLORIDE 10 MEQ/100ML IV SOLN
10.0000 meq | INTRAVENOUS | Status: AC
  Administered 2021-06-26 (×4): 10 meq via INTRAVENOUS
  Filled 2021-06-26 (×4): qty 100

## 2021-06-26 MED ORDER — SODIUM CHLORIDE 0.9 % IV SOLN
75.0000 mL/h | INTRAVENOUS | Status: AC
  Administered 2021-06-26: 75 mL/h via INTRAVENOUS

## 2021-06-26 MED ORDER — ACETAMINOPHEN 650 MG RE SUPP: 650.0000 mg | Freq: Four times a day (QID) | RECTAL | Status: DC | PRN

## 2021-06-26 MED ORDER — ACETAMINOPHEN 325 MG PO TABS
650.0000 mg | ORAL_TABLET | Freq: Every day | ORAL | Status: DC
  Administered 2021-06-26 – 2021-06-28 (×2): 650 mg via ORAL
  Filled 2021-06-26 (×3): qty 2

## 2021-06-26 MED ORDER — SODIUM CHLORIDE 0.9 % IV SOLN
500.0000 mg | Freq: Once | INTRAVENOUS | Status: AC
  Administered 2021-06-26: 500 mg via INTRAVENOUS
  Filled 2021-06-26: qty 500

## 2021-06-26 MED ORDER — ALBUTEROL SULFATE (2.5 MG/3ML) 0.083% IN NEBU
2.5000 mg | INHALATION_SOLUTION | RESPIRATORY_TRACT | Status: DC | PRN
Start: 2021-06-26 — End: 2021-06-26

## 2021-06-26 MED ORDER — HYDROCODONE-ACETAMINOPHEN 5-325 MG PO TABS: 1.0000 | ORAL_TABLET | ORAL | Status: DC | PRN

## 2021-06-26 MED ORDER — SODIUM CHLORIDE 0.9% IV SOLUTION: Freq: Once | INTRAVENOUS | Status: AC

## 2021-06-26 MED ORDER — LACTATED RINGERS IV BOLUS
2500.0000 mL | Freq: Once | INTRAVENOUS | Status: AC
  Administered 2021-06-26: 2500 mL via INTRAVENOUS

## 2021-06-26 MED ORDER — TAMSULOSIN HCL 0.4 MG PO CAPS
0.4000 mg | ORAL_CAPSULE | Freq: Every day | ORAL | Status: DC
  Administered 2021-06-27 – 2021-07-05 (×9): 0.4 mg via ORAL
  Filled 2021-06-26 (×9): qty 1

## 2021-06-26 MED ORDER — TAMSULOSIN HCL 0.4 MG PO CAPS: 0.4000 mg | ORAL_CAPSULE | Freq: Every day | ORAL | Status: DC

## 2021-06-26 MED ORDER — PANTOPRAZOLE SODIUM 40 MG IV SOLR
40.0000 mg | Freq: Two times a day (BID) | INTRAVENOUS | Status: DC
Start: 2021-06-26 — End: 2021-06-30
  Administered 2021-06-26 – 2021-06-30 (×8): 40 mg via INTRAVENOUS
  Filled 2021-06-26 (×8): qty 40

## 2021-06-26 MED ORDER — ALBUTEROL SULFATE HFA 108 (90 BASE) MCG/ACT IN AERS: 2.0000 | INHALATION_SPRAY | Freq: Four times a day (QID) | RESPIRATORY_TRACT | Status: DC | PRN

## 2021-06-26 MED ORDER — LACTATED RINGERS IV BOLUS (SEPSIS): 500.0000 mL | Freq: Once | INTRAVENOUS | Status: DC

## 2021-06-26 MED ORDER — POTASSIUM CHLORIDE CRYS ER 20 MEQ PO TBCR: 40.0000 meq | EXTENDED_RELEASE_TABLET | Freq: Once | ORAL | Status: DC

## 2021-06-26 MED ORDER — APIXABAN 2.5 MG PO TABS
2.5000 mg | ORAL_TABLET | Freq: Two times a day (BID) | ORAL | Status: DC
  Filled 2021-06-26: qty 1

## 2021-06-26 MED ORDER — ALBUTEROL SULFATE HFA 108 (90 BASE) MCG/ACT IN AERS
1.0000 | INHALATION_SPRAY | RESPIRATORY_TRACT | Status: DC | PRN
  Filled 2021-06-26: qty 6.7

## 2021-06-26 MED ORDER — SODIUM CHLORIDE 0.9 % IV SOLN
2.0000 g | INTRAVENOUS | Status: AC
Start: 2021-06-27 — End: 2021-07-01
  Administered 2021-06-27 – 2021-07-01 (×5): 2 g via INTRAVENOUS
  Filled 2021-06-26 (×5): qty 20

## 2021-06-26 MED ORDER — SODIUM CHLORIDE 0.9 % IV SOLN
2.0000 g | Freq: Once | INTRAVENOUS | Status: AC
  Administered 2021-06-26: 2 g via INTRAVENOUS
  Filled 2021-06-26: qty 20

## 2021-06-26 MED ORDER — CALCITONIN (SALMON) 200 UNIT/ML IJ SOLN
4.0000 [IU]/kg | Freq: Two times a day (BID) | INTRAMUSCULAR | Status: AC
  Administered 2021-06-26 – 2021-06-28 (×4): 290 [IU] via SUBCUTANEOUS
  Filled 2021-06-26 (×6): qty 1.45

## 2021-06-26 MED ORDER — ONDANSETRON HCL 4 MG PO TABS: 4.0000 mg | ORAL_TABLET | Freq: Four times a day (QID) | ORAL | Status: DC | PRN

## 2021-06-26 MED ORDER — POTASSIUM CHLORIDE 10 MEQ/100ML IV SOLN
10.0000 meq | INTRAVENOUS | Status: AC
Start: 2021-06-26 — End: 2021-06-27
  Administered 2021-06-26 (×2): 10 meq via INTRAVENOUS
  Filled 2021-06-26: qty 100

## 2021-06-26 MED ORDER — ONDANSETRON HCL 4 MG/2ML IJ SOLN
4.0000 mg | Freq: Four times a day (QID) | INTRAMUSCULAR | Status: DC | PRN
  Filled 2021-06-26: qty 2

## 2021-06-26 MED ORDER — ACETAMINOPHEN 325 MG PO TABS
650.0000 mg | ORAL_TABLET | Freq: Four times a day (QID) | ORAL | Status: DC | PRN
  Administered 2021-06-29: 650 mg via ORAL
  Filled 2021-06-26: qty 2

## 2021-06-26 MED ORDER — FENTANYL CITRATE PF 50 MCG/ML IJ SOSY
25.0000 ug | PREFILLED_SYRINGE | Freq: Once | INTRAMUSCULAR | Status: AC
Start: 2021-06-26 — End: 2021-06-26
  Administered 2021-06-26: 25 ug via INTRAVENOUS
  Filled 2021-06-26: qty 1

## 2021-06-26 MED ORDER — SODIUM CHLORIDE 0.9 % IV SOLN
500.0000 mg | INTRAVENOUS | Status: AC
  Administered 2021-06-27 – 2021-07-01 (×5): 500 mg via INTRAVENOUS
  Filled 2021-06-26 (×5): qty 500

## 2021-06-26 NOTE — H&P (Signed)
Alfred Mercado QAS:[id number] DOB: [birth year] DOA: 06/26/2021   PCP: Raymondo Band, MD  Charlott Rakes, MD Outpatient Specialists:      Patient arrived to ER on 06/26/21 at 1356 Referred by Attending Toy Baker, MD   Patient coming from:   From facility [facility]  Chief Complaint: confusion  HPI: Alfred Mercado is a 85 y.o. male with medical history significant of Sepsis, diastolic CHF,   A.fib on eliquis, dementia, hx of staghorn calculus  Presented with   confusion fatigue 2 wks ago was diagnosed with Covid and PNA and was started on ABX coulnot tolerate PO so was given Rcephin IM On arrival hypotensive to 49' was given IV fluid bolus patient unable to provide much history   KNOWN COVID POSITIVE   Has been vaccinated against COVID     Initial COVID TEST    POSITIVE,     Lab Results  Component Value Date   [place] (A) 06/26/2021   [place] NEGATIVE 03/17/2021   [place] NEGATIVE 03/01/2021   [place] NEGATIVE 02/09/2021    Regarding pertinent Chronic problems:       HTN on metoprolol, cardizem   chronic CHF diastolic/systolic/ combined - last echo 2020 with EF 50-555     A. Fib -  - CHA2DS2 vas score  3     current  on anticoagulation with   Eliquis,          -  Rate control:  Currently controlled with   Diltiazem,  meetoprolol     CKD stage IIIb- baseline Cr 1.57 CrCl cannot be calculated (Unknown ideal weight.).  Lab Results  Component Value Date   CREATININE 3.11 (H) 06/26/2021   CREATININE 1.57 (H) 03/17/2021   CREATININE 1.36 (H) 03/13/2021     BPH - on Flomax,       Dementia - on buspar    Chronic anemia - baseline hg Hemoglobin & Hematocrit  Recent Labs    03/13/21 1047 03/17/21 0837 06/26/21 1426  HGB 9.3* 9.7* 9.6*     While in ER: Stool Hemoccult positive but no melena given IV fluids Started on azithromycin and Rocephin Persistently positive for COVID to be expected Lactic acid up to  2.0 given IV fluids Noted to be having hypokalemia potassium down to 2.7 and was replaced Noted to have AKI with creatinine up to 3.1 Hemoglobin down to 9.6 but that is close to baseline. Chest x-ray showing possible atypical infection   ED Triage Vitals [06/26/21 1404]  Enc Vitals Group     BP (!) 82/53     Pulse Rate (!) 59     Resp 20     Temp 97.7 F (36.5 C)     Temp Source Axillary     SpO2 99 %     Weight      Height      Head Circumference      Peak Flow      Pain Score      Pain Loc      Pain Edu?      Excl. in [place]?   NLGX(21)@     _________________________________________ Significant initial  Findings: Abnormal Labs Reviewed  RESP PANEL BY RT-PCR (FLU A&B, COVID) ARPGX2 - Abnormal; Notable for the following components:      Result Value   SARS Coronavirus 2 by RT PCR POSITIVE (*)    All other components within normal limits  LACTIC ACID, PLASMA - Abnormal; Notable for the following components:  Lactic Acid, Venous 2.0 (*)    All other components within normal limits  COMPREHENSIVE METABOLIC PANEL - Abnormal; Notable for the following components:   Potassium 2.7 (*)    CO2 17 (*)    Glucose, Bld 156 (*)    BUN 47 (*)    Creatinine, Ser 3.11 (*)    Calcium 12.7 (*)    Total Protein 5.5 (*)    Albumin 2.6 (*)    GFR, Estimated 19 (*)    All other components within normal limits  CBC WITH DIFFERENTIAL/PLATELET - Abnormal; Notable for the following components:   RBC 3.30 (*)    Hemoglobin 9.6 (*)    HCT 28.5 (*)    RDW 16.1 (*)    All other components within normal limits  PROTIME-INR - Abnormal; Notable for the following components:   Prothrombin Time 18.5 (*)    INR 1.5 (*)    All other components within normal limits  URINALYSIS, ROUTINE W REFLEX MICROSCOPIC - Abnormal; Notable for the following components:   APPearance CLOUDY (*)    Hgb urine dipstick LARGE (*)    Leukocytes,Ua LARGE (*)    RBC / HPF >50 (*)    WBC, UA >50 (*)    Bacteria, UA RARE  (*)    All other components within normal limits  POC OCCULT BLOOD, ED - Abnormal; Notable for the following components:   Fecal Occult Bld POSITIVE (*)    All other components within normal limits   ____________________________________________ Ordered CT HEAD   NON acute  CXR - Vague reticular opacities in the right perihilar lung may be vascular or represent atypical infection.  CTabd/pelvis -  nonacute     _________________________ Troponin 35 ECG: Ordered Personally reviewed by me showing: HR : 45 Rhythm: A.fib. W LBBB   no evidence of ischemic changes QTC 393 ____________________ This patient meets SIRS Criteria and may be septic.   The recent clinical data is shown below. Vitals:   06/26/21 1745 06/26/21 1800 06/26/21 1815 06/26/21 1915  BP: 105/64 (!) 103/53 100/60 110/65  Pulse: 87 70 86 72  Resp: (!) 21 20 (!) 21 17  Temp:      TempSrc:      SpO2: 100% 100% 100% 100%      WBC     Component Value Date/Time   WBC 7.5 06/26/2021 1426   LYMPHSABS 0.9 06/26/2021 1426   LYMPHSABS 1.0 11/30/2020 1202   MONOABS 0.8 06/26/2021 1426   EOSABS 0.1 06/26/2021 1426   EOSABS 0.1 11/30/2020 1202   BASOSABS 0.1 06/26/2021 1426   BASOSABS 0.1 11/30/2020 1202    Lactic Acid, Venous    Component Value Date/Time   LATICACIDVEN 2.0 (HH) 06/26/2021 1431     Procalcitonin 0.1 Lactic Acid, Venous    Component Value Date/Time   LATICACIDVEN 1.1 06/26/2021 1815     UA possible UTI   Urine analysis:    Component Value Date/Time   COLORURINE YELLOW 06/26/2021 1449   APPEARANCEUR CLOUDY (A) 06/26/2021 1449   LABSPEC 1.009 06/26/2021 1449   PHURINE 6.0 06/26/2021 1449   GLUCOSEU NEGATIVE 06/26/2021 1449   HGBUR LARGE (A) 06/26/2021 [place] 06/26/2021 1449   BILIRUBINUR negative 06/27/2020 [place] 06/26/2021 [place] 06/26/2021 1449   UROBILINOGEN 1.0 06/27/2020 1140   NITRITE NEGATIVE 06/26/2021 1449    LEUKOCYTESUR LARGE (A) 06/26/2021 1449    Results for orders placed or performed during the hospital  encounter of 06/26/21  Resp Panel by RT-PCR (Flu A&B, Covid) Nasopharyngeal Swab     Status: Abnormal   Collection Time: 06/26/21  3:06 PM   Specimen: Nasopharyngeal Swab; Nasopharyngeal(NP) swabs in vial transport medium  Result Value Ref Range Status   SARS Coronavirus 2 by RT PCR POSITIVE (A) NEGATIVE Final         Influenza A by PCR NEGATIVE NEGATIVE Final   Influenza B by PCR NEGATIVE NEGATIVE Final          _______________________________________________ Hospitalist was called for admission for Sepsis   The following Work up has been ordered so far:  Orders Placed This Encounter  Procedures   Urine Culture   Blood Culture (routine x 2)   Resp Panel by RT-PCR (Flu A&B, Covid) Nasopharyngeal Swab   DG Chest Port 1 View   Lactic acid, plasma   Comprehensive metabolic panel   CBC WITH DIFFERENTIAL   Protime-INR   APTT   Urinalysis, Routine w reflex microscopic   Procalcitonin   Magnesium   Phosphorus   CK   Diet NPO time specified   Cardiac monitoring   Document height and weight   Assess and Document Glasgow Coma Scale   Document vital signs within 1-hour of fluid bolus completion.  Notify provider of abnormal vital signs despite fluid resuscitation.   Refer to Sidebar Report: Sepsis Bundle ED/IP   Notify provider for difficulties obtaining IV access   Initiate [place] Fluid Protocol   Check Rectal Temperature   In and Out Cath   Cardiac monitoring   pharmacy consult   Consult to hospitalist   Pulse oximetry, continuous   POC occult blood, ED   EKG 12-Lead   ED EKG 12-Lead   Type and screen   ABO/Rh   Insert peripheral IV X 1   Admit to Inpatient (patient's expected length of stay will be greater than 2 midnights or inpatient only procedure)     Following Medications were ordered in ER: Medications  potassium chloride 10 mEq in 100 mL IVPB (10 mEq  Intravenous New Bag/Given 06/26/21 1819)  potassium chloride SA (KLOR-CON) CR tablet 40 mEq (40 mEq Oral Not Given 06/26/21 1709)  lactated ringers bolus 2,500 mL (0 mLs Intravenous Stopped 06/26/21 1700)  cefTRIAXone (ROCEPHIN) 2 g in sodium chloride 0.9 % 100 mL IVPB (0 g Intravenous Stopped 06/26/21 1700)  azithromycin (ZITHROMAX) 500 mg in sodium chloride 0.9 % 250 mL IVPB (0 mg Intravenous Stopped 06/26/21 1722)        Consult Orders  (From admission, onward)           Start     Ordered   06/26/21 1817  Consult to hospitalist  Paged to [facility] by Kalman Shan  Once       Provider:  (Not yet assigned)  Question Answer Comment  Place call to: [facility]   Reason for Consult Admit      06/26/21 1816              OTHER Significant initial  Findings:  labs showing:    Recent Labs  Lab 06/26/21 1426  NA 136  K 2.7*  CO2 17*  GLUCOSE 156*  BUN 47*  CREATININE 3.11*  CALCIUM 12.7*    Cr  Up from baseline see below Lab Results  Component Value Date   CREATININE 3.11 (H) 06/26/2021   CREATININE 1.57 (H) 03/17/2021   CREATININE 1.36 (H) 03/13/2021    Recent Labs  Lab 06/26/21 1426  AST 18  ALT 16  ALKPHOS 69  BILITOT 0.3  PROT 5.5*  ALBUMIN 2.6*   Lab Results  Component Value Date   CALCIUM 12.7 (H) 06/26/2021          Plt: Lab Results  Component Value Date   PLT 297 06/26/2021     COVID-19 Labs  No results for input(s): DDIMER, FERRITIN, LDH, CRP in the last 72 hours.  Lab Results  Component Value Date   SARSCOV2NAA POSITIVE (A) 06/26/2021   SARSCOV2NAA NEGATIVE 03/17/2021   [place] NEGATIVE 03/01/2021   [place] NEGATIVE 02/09/2021     Venous  Blood Gas result:  pH  7.290  pCO2  29.2  ABG    Component Value Date/Time   HCO3 14.4 (L) 06/26/2021 2028   TCO2 15 (L) 06/26/2021 2028   ACIDBASEDEF 11.0 (H) 06/26/2021 2028   O2SAT 97.0 06/26/2021 2028      Recent Labs  Lab 06/26/21 1426  WBC 7.5  NEUTROABS 5.7  HGB 9.6*   HCT 28.5*  MCV 86.4  PLT 297    HG/HCT down    Component Value Date/Time   HGB 6.5 (LL) 06/26/2021 2028   HGB 10.1 (L) 11/30/2020 1202   HCT 19.0 (L) 06/26/2021 2028   HCT 30.6 (L) 11/30/2020 1202   MCV 86.8 06/26/2021 1948   MCV 80 11/30/2020 1202     No results for input(s): LIPASE, AMYLASE in the last 168 hours. Recent Labs  Lab 06/26/21 2030  AMMONIA 21     Cardiac Panel (last 3 results) Recent Labs    06/26/21 1839  CKTOTAL 8*     BNP (last 3 results) Recent Labs    08/13/20 1056  BNP 365.3*    DM  labs:  HbA1C: Recent Labs    01/25/21 0222  HGBA1C 6.1*       CBG (last 3)  No results for input(s): GLUCAP in the last 72 hours.     Cultures:    Component Value Date/Time   SDES URINE, RANDOM 03/07/2021 1705   SPECREQUEST NONE 03/07/2021 1705   CULT  03/07/2021 1705    NO GROWTH Performed at [facility], [place] [address], [place], [place] [address]    REPTSTATUS 03/10/2021 FINAL 03/07/2021 1705     Radiological Exams on Admission: CT HEAD WO CONTRAST (5MM)  Result Date: 06/26/2021 CLINICAL DATA:  Altered mental status. EXAM: CT HEAD WITHOUT CONTRAST TECHNIQUE: Contiguous axial images were obtained from the base of the skull through the vertex without intravenous contrast. COMPARISON:  Head CT dated 01/24/2021. FINDINGS: Brain: Mild age-related atrophy and chronic microvascular ischemic changes. There is no acute intracranial hemorrhage. No mass effect or midline shift. Similar appearance of left frontotemporal low attenuating extra-axial collection, likely old hygroma. Vascular: No hyperdense vessel or unexpected calcification. Skull: Normal. Negative for fracture or focal lesion. Sinuses/Orbits: Mild diffuse mucoperiosteal thickening of paranasal sinuses. No air-fluid level. The mastoid air cells are clear. Other: None IMPRESSION: 1. No acute intracranial pathology. 2. Mild age-related atrophy and chronic microvascular ischemic changes. 3.  Stable left frontotemporal low attenuating extra-axial collection, likely old hygroma. Electronically Signed   By: Anner Crete M.D.   On: 06/26/2021 23:25   DG Chest Port 1 View  Result Date: 06/26/2021 CLINICAL DATA:  Questionable sepsis - evaluate for abnormality More lethargic than normal. EXAM: PORTABLE CHEST 1 VIEW COMPARISON:  Radiograph 03/01/2021.  Chest CT 01/24/2021 FINDINGS: Patient had difficulty tolerating the exam, and patient's hand partially obscures evaluation of  the right upper chest. Similar volume loss to prior radiograph with right-sided pleural effusion. Slight increase fluid in the right minor fissure. Vague reticular opacities in the right perihilar lung. Unchanged heart size. Unchanged mediastinal contours with aortic atherosclerosis and tortuosity. No visualized left pleural effusion. No pneumothorax. IMPRESSION: Chronic unchanged right-sided pleural effusion with unchanged volume loss from prior radiograph. Vague reticular opacities in the right perihilar lung may be vascular or represent atypical infection. Pulmonary edema is felt less likely. Electronically Signed   By: Keith Rake M.D.   On: 06/26/2021 15:33   CT RENAL STONE STUDY  Result Date: 06/26/2021 CLINICAL DATA:  Acute renal failure EXAM: CT ABDOMEN AND PELVIS WITHOUT CONTRAST TECHNIQUE: Multidetector CT imaging of the abdomen and pelvis was performed following the standard protocol without IV contrast. COMPARISON:  None. FINDINGS: LOWER CHEST: Small right pleural effusion and right basilar atelectasis. HEPATOBILIARY: Normal hepatic contours. No intra- or extrahepatic biliary dilatation. Normal gallbladder. PANCREAS: Normal pancreas. No ductal dilatation or peripancreatic fluid collection. SPLEEN: Normal. ADRENALS/URINARY TRACT: The adrenal glands are normal. There are multiple large bilateral renal calculi, as previously demonstrated. On the right, stones extend into the renal pelvis and measure up to 4.7  cm. No hydronephrosis. The urinary bladder is normal for degree of distention STOMACH/BOWEL: There is no hiatal hernia. Normal duodenal course and caliber. No small bowel dilatation or inflammation. Moderate amount of stool in the colon. Normal appendix. VASCULAR/LYMPHATIC: There is calcific atherosclerosis of the abdominal aorta. No lymphadenopathy. REPRODUCTIVE: There are calcifications within the normal-sized prostate. Symmetric seminal vesicles. MUSCULOSKELETAL. Chronic compression deformity at L1 OTHER: Small amount of right lower quadrant subcutaneous gas. IMPRESSION: 1. No acute abnormality of the abdomen or pelvis. 2. Multiple large bilateral renal calculi, as previously demonstrated. On the right, stones extend into the renal pelvis and measure up to 4.7 cm. No hydronephrosis. 3. Small right pleural effusion and right basilar atelectasis. Aortic Atherosclerosis (ICD10-I70.0). Electronically Signed   By: Ulyses Jarred M.D.   On: 06/26/2021 23:45   _______________________________________________________________________________________________________ Latest  Blood pressure 110/65, pulse 72, temperature 97.7 F (36.5 C), temperature source Axillary, resp. rate 17, SpO2 100 %.   Review of Systems:    Pertinent positives include:  confusion fatigue,   Constitutional:  No weight loss, night sweats, Fevers, chills, weight loss  HEENT:  No headaches, Difficulty swallowing,Tooth/dental problems,Sore throat,  No sneezing, itching, ear ache, nasal congestion, post nasal drip,  Cardio-vascular:  No chest pain, Orthopnea, PND, anasarca, dizziness, palpitations.no Bilateral lower extremity swelling  GI:  No heartburn, indigestion, abdominal pain, nausea, vomiting, diarrhea, change in bowel habits, loss of appetite, melena, blood in stool, hematemesis Resp:  no shortness of breath at rest. No dyspnea on exertion, No excess mucus, no productive cough, No non-productive cough, No coughing up of blood.No  change in color of mucus.No wheezing. Skin:  no rash or lesions. No jaundice GU:  no dysuria, change in color of urine, no urgency or frequency. No straining to urinate.  No flank pain.  Musculoskeletal:  No joint pain or no joint swelling. No decreased range of motion. No back pain.  Psych:  No change in mood or affect. No depression or anxiety. No memory loss.  Neuro: no localizing neurological complaints, no tingling, no weakness, no double vision, no gait abnormality, no slurred speech, no   All systems reviewed and apart from [place] all are negative _______________________________________________________________________________________________ Past Medical History:   Past Medical History:  Diagnosis Date   Acute heart failure with preserved  ejection fraction [facility])    Atrial fibrillation with rapid ventricular response ([place]) 07/11/2019      Past Surgical History:  Procedure Laterality Date   APPENDECTOMY     IR GUIDED DRAIN W CATHETER PLACEMENT  01/25/2021   IR NEPHROSTOMY EXCHANGE RIGHT  03/06/2021   IR NEPHROSTOMY PLACEMENT RIGHT  01/25/2021    Social History:       reports that he has been smoking. He has never used smokeless tobacco. He reports that he does not currently use alcohol. He reports that he does not use drugs.   Family History:   Family History  Problem Relation Age of Onset   Aneurysm Father    ______________________________________________________________________________________________ Allergies: No Known Allergies   Prior to Admission medications   Medication Sig Start Date End Date Taking? Authorizing Provider  acetaminophen (TYLENOL) 325 MG tablet Take 650 mg by mouth at bedtime.    [provider]  albuterol (VENTOLIN HFA) 108 (90 Base) MCG/ACT inhaler Inhale 2 puffs into the lungs every 6 (six) hours as needed for wheezing or shortness of breath. 11/30/20   Charlott Rakes, MD  apixaban (ELIQUIS) 2.5 MG TABS tablet Take 1 tablet (2.5 mg  total) by mouth 2 (two) times daily. 02/12/21 05/13/21  Lacinda Axon, MD  bisacodyl (DULCOLAX) 5 MG EC tablet Take 20 mg by mouth daily as needed for moderate constipation.    [provider]  diltiazem (CARDIZEM CD) 120 MG 24 hr capsule TAKE 1 CAPSULE(120 MG) BY MOUTH DAILY 03/28/21   Charlott Rakes, MD  furosemide (LASIX) 40 MG tablet Take 1 tablet (40 mg total) by mouth daily. 02/12/21 05/13/21  Lacinda Axon, MD  hydrOXYzine (ATARAX/VISTARIL) 10 MG tablet Take 1 tablet (10 mg total) by mouth 3 (three) times daily as needed for itching. 02/02/21   Madalyn Rob, MD  metoprolol tartrate (LOPRESSOR) 25 MG tablet TAKE 1 TABLET(25 MG) BY MOUTH TWICE DAILY 11/30/20   Charlott Rakes, MD  Misc. Devices [place] Power wheelchair.  Diagnosis atrial fibrillation, gait abnormality 12/01/20   Charlott Rakes, MD  nicotine (NICODERM CQ - DOSED IN MG/24 HR) 7 mg/24hr patch Place 1 patch (7 mg total) onto the skin daily at 6 (six) AM. 02/03/21   Madalyn Rob, MD  OVER THE COUNTER MEDICATION Take 1-2 tablets by mouth at bedtime. Sleeping pill    [provider]  polyethylene glycol powder (GLYCOLAX/MIRALAX) 17 GM/SCOOP powder Take 17 g by mouth daily as needed for mild constipation.    [provider]  tamsulosin (FLOMAX) 0.4 MG CAPS capsule Take 1 capsule (0.4 mg total) by mouth daily. 02/03/21   Madalyn Rob, MD  Tiotropium Bromide Monohydrate (SPIRIVA RESPIMAT) 2.5 MCG/ACT AERS INHALE 2 PUFFS BY MOUTH DAILY 11/30/20   Charlott Rakes, MD  Vitamin D, Ergocalciferol, (DRISDOL) 1.25 MG (50000 UNIT) CAPS capsule TAKE 1 CAPSULE BY MOUTH 1 TIME A WEEK 05/23/21   Charlott Rakes, MD    ___________________________________________________________________________________________________ Physical Exam: Vitals with BMI 06/26/2021 06/26/2021 06/26/2021  Height - - -  Weight - - -  BMI - - -  Systolic 361 224 497  Diastolic 65 60 53  Pulse 72 86 70     1. General:  in No  Acute distress    Chronically ill   -appearing 2. Psychological: Alert and  Oriented to self 3. Head/ENT:    Dry Mucous Membranes  Head Non traumatic, neck supple                          Poor Dentition 4. SKIN:  decreased Skin turgor,  Skin clean Dry and intact no rash 5. Heart: Regular rate and rhythm no Murmur, no Rub or gallop 6. Lungs: no wheezes or crackles   7. Abdomen: Soft,  non-tender, Non distended  bowel sounds present 8. Lower extremities: no clubbing, cyanosis, no  edema 9. Neurologically Grossly intact, moving all 4 extremities equally   10. MSK: Normal range of motion    Chart has been reviewed  ______________________________________________________________________________________________  Assessment/Plan 85 y.o. male with medical history significant of Sepsis, diastolic CHF,   A.fib on eliquis, dementia, hx of staghorn calculus    Admitted for SEPSIS, symptomatic anemia, acute encephalopathy and hypercalcemia  Present on Admission:  CAP (community acquired pneumonia) - possilbe post viral vs superimposed bacterial Cover with IV antibiotics for now Pt with  Infiltrates and SOB    UTI (urinary tract infection) - CT renal , urine cult   - treat with Rocephin         await results of urine culture and adjust antibiotic coverage as needed  Acute  metabolic encephalopathy -     most likely multifactorial secondary to combination of  infection  mild dehydration secondary to decreased by mouth intake,  polypharmacy   - Will rehydrate   - treat underlining infection   - Hold contributing medication   - if no improvement may need further imaging to evaluate for CNS pathology pathology such as MRI of the brain   - neurological exam appears to be nonfocal but patient unable to cooperate fully   - VBG  no evidence of hypercarbia    - no history of liver disease ammonia unremarkable   Severe sepsis ([place]) -  -SIRS criteria met with        RR >20 Today's Vitals    06/26/21 1815 06/26/21 1915 06/26/21 1930 06/26/21 1945  BP: 100/60 110/65 112/72 (!) 102/55  Pulse: 86 72 (!) 58 69  Resp: (!) 21 17 (!) 27 (!) 23  Temp:      TempSrc:      SpO2: 100% 100% 100% 100%    The recent clinical data is shown below. Vitals:   06/26/21 1815 06/26/21 1915 06/26/21 1930 06/26/21 1945  BP: 100/60 110/65 112/72 (!) 102/55  Pulse: 86 72 (!) 58 69  Resp: (!) 21 17 (!) 27 (!) 23  Temp:      TempSrc:      SpO2: 100% 100% 100% 100%     -Most likely source being: urinary, pulmonary, I   Patient meeting criteria for Severe sepsis with    evidence of end organ damage/organ dysfunction such as    Acute Kidney Injury with Cr > 2,  Lab Results  Component Value Date   CREATININE 3.11 (H) 06/26/2021   CREATININE 1.57 (H) 03/17/2021   elevated lactic acid >2     acute metabolic encephalopathy  QMG<50 mmhg o      - Obtain serial lactic acid and procalcitonin level.  - Initiated IV antibiotics in ER: Antibiotics Given (last 72 hours)     Date/Time Action Medication Dose Rate   06/26/21 1616 New Bag/Given   azithromycin (ZITHROMAX) 500 mg in sodium chloride 0.9 % 250 mL IVPB 500 mg 250 mL/hr   06/26/21 1619 New Bag/Given   cefTRIAXone (ROCEPHIN) 2 g  in sodium chloride 0.9 % 100 mL IVPB 2 g 200 mL/hr       Will continue     - await results of blood and urine culture  - Rehydrate aggressively  Intravenous fluids were administered,    8:03 PM   Iron deficiency anemia - hg dropped to 7.3 with IV fluids, transfuse 2 unit Will discuss with GI   check anemia panel   Hypotension - improved with iv fluids hold off on home meds   Hypokalemia - will replace     Chronic heart failure with preserved ejection fraction (HCC) - apears on the dry side hold diuretics   Acute-on-chronic kidney injury ([place]) - CT renal, urine elctrolytes hold diuretics Rehydrate Correct hypercalcemia    COVID-19 virus infection - diagnosed 2 wks ago Supportive management, acute  phase likely resolved   Occult blood positive stool -   no melena or blood per rectum hg dropped Hold Eliquids, sent msg to GI  NPO post midnight in case need proceedures and  Prtonix bid  Hypercalcemia  treat with IV fluids and calcitonin continue to follow Check PTH and PTH rp  Elevated troponin -  -no chest pain no EKG changes in the setting of  increased work of breathing a chronic kidney disease likely due to demand ischemia and poor clearance, monitor on telemetry and cycle cardiac enzymes to trend. ECHO IN AM  if continues to rise will need further work-up  Other plan as per orders.  DVT prophylaxis:  scd   Code Status:    Code Status: Prior FULL CODE care as per chart      Family Communication:   Family not at  Bedside    Disposition Plan:                             Back to current facility when stable    Following barriers for discharge:                            Electrolytes corrected                               Anemia Stable                                                       able to transition to PO antibiotics                             Will need to be able to tolerate PO                                            Would benefit from PT/OT eval prior to DC  Ordered                   Swallow eval - SLP ordered  Transition of care consulted                   Nutrition    consulted                                    Palliative care    consulted                             Consults called: sent msg to [facility]  Admission status:  ED Disposition     ED Disposition  [place]: [facility] [place] [[id number]]  Level of Care: Telemetry Medical [[id number]]  May admit patient to [facility] or [facility] if equivalent level of care is available:: No  Covid Evaluation: Confirmed COVID Positive  Diagnosis: CAP (community acquired pneumonia) [887195]  Admitting Physician: Toy Baker [[id number]]  Attending Physician: Toy Baker [[id number]]  Estimated length of stay: past midnight tomorrow  Certification:: I certify this patient will need inpatient services for at least 2 midnights           inpatient     I Expect 2 midnight stay secondary to severity of patient's current illness need for inpatient interventions justified by the following:  hemodynamic instability despite optimal treatment ( hypotension  )  Severe lab/radiological/exam abnormalities including:    CAP, UTI SEPSIS and extensive comorbidities including:   CHF  CKD   Dementia    That are currently affecting medical management.   I expect  patient to be hospitalized for 2 midnights requiring inpatient medical care.  Patient is at high risk for adverse outcome (such as loss of life or disability) if not treated.  Indication for inpatient stay as follows:  Severe change from baseline regarding mental status Hemodynamic instability despite maximal medical therapy,      Need for IV antibiotics, IV fluids,    Level of care  progressive tele indefinitely please discontinue once patient no longer qualifies COVID-19 Labs    Lab Results  Component Value Date   [place] (A) 06/26/2021     Precautions: admitted as covid positive 2 wks ago still testing positive with persistent infiltrates Keep on airborne until day 21     PPE: Used by the provider:   N95  eye Goggles,  Gloves    Rebekah Sprinkle 06/27/2021, 12:48 AM    [facility]     after 2 AM please page floor coverage PA If 7AM-7PM, please contact the day team taking care of the patient using [email or web address]   Patient was evaluated in the context of the global COVID-19 pandemic, which necessitated consideration that the patient might be at risk for infection with the SARS-CoV-2 virus that causes COVID-19. Institutional protocols and algorithms that pertain to the evaluation of patients at risk for COVID-19  are in a state of rapid change based on information released by regulatory bodies including the CDC and federal and state organizations. These policies and algorithms were followed during the patient's care.

## 2021-06-26 NOTE — ED Provider Notes (Signed)
Received signout from previous provider, please see her note for complete H&P.  This is an 85 year old gentleman presenting with altered mental status and fatigue. Pt lives at [facility] facility.  He initially was found to be hypotensive with systolic blood pressure in the 70s improved with 1 L of IV fluid.  Has history of dementia and therefore history was difficult to obtain.  He was recently treated with antibiotic for presumed pneumonia.  Patient has had COVID several weeks prior.   UA today shows cloudy appearance, large leukocyte esterase as well as greater than 50 WBC.  Patient given Rocephin and Zithromax to treat for potential UTI as well as pna.  elevated lactic acid of 2.0, IV fluid given.  Hypokalemia with potassium of 2.7, supplemented given.  Evidence of AKI with creatinine of 3.1 much worse than baseline.  Hemoglobin is 9.6 similar to baseline.  Chest x-ray shows vague reticular opacity in the right perihilar lungs which may be vascular or represents atypical infection.  Furthermore patient does have dark stool with Hemoccult positive but no melanotic stool and no significant drop in his hemoglobin from baseline. He is currently on Eliquis for a.fib.  6:38 PM Appreciate consultation from [facility], Dr. Roel Cluck who agrees to admit patient.  She request for CK level, mag, Phos, and procalcitonin.  Will order appropriate test.  CRITICAL CARE Performed by: Domenic Moras Total critical care time: 31 minutes Critical care time was exclusive of separately billable procedures and treating other patients. Critical care was necessary to treat or prevent imminent or life-threatening deterioration. Critical care was time spent personally by me on the following activities: development of treatment plan with patient and/or surrogate as well as nursing, discussions with consultants, evaluation of patient's response to treatment, examination of patient, obtaining history from patient or  surrogate, ordering and performing treatments and interventions, ordering and review of laboratory studies, ordering and review of radiographic studies, pulse oximetry and re-evaluation of patient's condition.   BP 100/60   Pulse 86   Temp 97.7 F (36.5 C) (Axillary)   Resp (!) 21   SpO2 100%   Results for orders placed or performed during the hospital encounter of 06/26/21  Resp Panel by RT-PCR (Flu A&B, Covid) Nasopharyngeal Swab   Specimen: Nasopharyngeal Swab; Nasopharyngeal(NP) swabs in vial transport medium  Result Value Ref Range   SARS Coronavirus 2 by RT PCR POSITIVE (A) NEGATIVE   Influenza A by PCR NEGATIVE NEGATIVE   Influenza B by PCR NEGATIVE NEGATIVE  Lactic acid, plasma  Result Value Ref Range   Lactic Acid, Venous 2.0 (HH) 0.5 - 1.9 mmol/L  Comprehensive metabolic panel  Result Value Ref Range   Sodium 136 135 - 145 mmol/L   Potassium 2.7 (LL) 3.5 - 5.1 mmol/L   Chloride 110 98 - 111 mmol/L   CO2 17 (L) 22 - 32 mmol/L   Glucose, Bld 156 (H) 70 - 99 mg/dL   BUN 47 (H) 8 - 23 mg/dL   Creatinine, Ser 3.11 (H) 0.61 - 1.24 mg/dL   Calcium 12.7 (H) 8.9 - 10.3 mg/dL   Total Protein 5.5 (L) 6.5 - 8.1 g/dL   Albumin 2.6 (L) 3.5 - 5.0 g/dL   AST 18 15 - 41 U/L   ALT 16 0 - 44 U/L   Alkaline Phosphatase 69 38 - 126 U/L   Total Bilirubin 0.3 0.3 - 1.2 mg/dL   GFR, Estimated 19 (L) >60 mL/min   Anion gap 9 5 - 15  CBC WITH DIFFERENTIAL  Result Value Ref Range   WBC 7.5 4.0 - 10.5 K/uL   RBC 3.30 (L) 4.22 - 5.81 MIL/uL   Hemoglobin 9.6 (L) 13.0 - 17.0 g/dL   HCT 28.5 (L) 39.0 - 52.0 %   MCV 86.4 80.0 - 100.0 fL   MCH 29.1 26.0 - 34.0 pg   MCHC 33.7 30.0 - 36.0 g/dL   RDW 16.1 (H) 11.5 - 15.5 %   Platelets 297 150 - 400 K/uL   nRBC 0.0 0.0 - 0.2 %   Neutrophils Relative % 75 %   Neutro Abs 5.7 1.7 - 7.7 K/uL   Lymphocytes Relative 11 %   Lymphs Abs 0.9 0.7 - 4.0 K/uL   Monocytes Relative 11 %   Monocytes Absolute 0.8 0.1 - 1.0 K/uL   Eosinophils Relative 1 %    Eosinophils Absolute 0.1 0.0 - 0.5 K/uL   Basophils Relative 1 %   Basophils Absolute 0.1 0.0 - 0.1 K/uL   Immature Granulocytes 1 %   Abs Immature Granulocytes 0.05 0.00 - 0.07 K/uL  Protime-INR  Result Value Ref Range   Prothrombin Time 18.5 (H) 11.4 - 15.2 seconds   INR 1.5 (H) 0.8 - 1.2  APTT  Result Value Ref Range   aPTT 31 24 - 36 seconds  Urinalysis, Routine w reflex microscopic Nasopharyngeal Swab  Result Value Ref Range   Color, Urine YELLOW YELLOW   APPearance CLOUDY (A) CLEAR   Specific Gravity, Urine 1.009 1.005 - 1.030   pH 6.0 5.0 - 8.0   Glucose, UA NEGATIVE NEGATIVE mg/dL   Hgb urine dipstick LARGE (A) NEGATIVE   Bilirubin Urine NEGATIVE NEGATIVE   Ketones, ur NEGATIVE NEGATIVE mg/dL   Protein, ur NEGATIVE NEGATIVE mg/dL   Nitrite NEGATIVE NEGATIVE   Leukocytes,Ua LARGE (A) NEGATIVE   RBC / HPF >50 (H) 0 - 5 RBC/hpf   WBC, UA >50 (H) 0 - 5 WBC/hpf   Bacteria, UA RARE (A) NONE SEEN   Squamous Epithelial / LPF 11-20 0 - 5   WBC Clumps PRESENT    Mucus PRESENT    Hyaline Casts, UA PRESENT    Ca Oxalate Crys, UA PRESENT   POC occult blood, ED  Result Value Ref Range   Fecal Occult Bld POSITIVE (A) NEGATIVE  Type and screen  Result Value Ref Range   ABO/RH(D) O POS    Antibody Screen NEG    Sample Expiration      06/29/2021,2359 Performed at [facility], [address], [place], [place] [address]    DG Chest Port 1 View  Result Date: 06/26/2021 CLINICAL DATA:  Questionable sepsis - evaluate for abnormality More lethargic than normal. EXAM: PORTABLE CHEST 1 VIEW COMPARISON:  Radiograph 03/01/2021.  Chest CT 01/24/2021 FINDINGS: Patient had difficulty tolerating the exam, and patient's hand partially obscures evaluation of the right upper chest. Similar volume loss to prior radiograph with right-sided pleural effusion. Slight increase fluid in the right minor fissure. Vague reticular opacities in the right perihilar lung. Unchanged heart size.  Unchanged mediastinal contours with aortic atherosclerosis and tortuosity. No visualized left pleural effusion. No pneumothorax. IMPRESSION: Chronic unchanged right-sided pleural effusion with unchanged volume loss from prior radiograph. Vague reticular opacities in the right perihilar lung may be vascular or represent atypical infection. Pulmonary edema is felt less likely. Electronically Signed   By: Keith Rake M.D.   On: 06/26/2021 15:33       Domenic Moras, PA-C 06/26/21 1839  Benjiman Core, MD 06/27/21 0005

## 2021-06-26 NOTE — ED Provider Notes (Signed)
[facility] EMERGENCY DEPARTMENT Provider Note   CSN: [id number] Arrival date & time: 06/26/21  1356     History No chief complaint on file.   Alfred Mercado is a 85 y.o. male who presents from [facility] skilled nursing facility for lethargy.  EMS gives the history as patient appears to have a history of dementia although this is not listed and is listed as "patient is incapable of making informed decisions."  EMS reports that the patient has known ammonia but has refused to take his p.o. medications since the 22nd.  He was recently changed to IM Rocephin for treatment.  They report that the nursing staff called out to have him transported because he was lethargic today.  Patient is unable to provide any other history.  HPI     Past Medical History:  Diagnosis Date   Acute heart failure with preserved ejection fraction (HCC)    Atrial fibrillation with rapid ventricular response (HCC) 07/11/2019    Patient Active Problem List   Diagnosis Date Noted   Malnutrition of moderate degree 03/14/2021   Patient incapable of making informed decisions    Somnolence    Sepsis (HCC) 03/02/2021   Cellulitis 03/02/2021   Hyponatremia 03/02/2021   Renal failure    Hypotension 02/09/2021   Falls 02/09/2021   Syncope and collapse 02/09/2021   Severe sepsis (HCC) 01/25/2021   Iron deficiency anemia 01/25/2021   Generalized weakness    Acute-on-chronic kidney injury (HCC) 01/24/2021   Complicated UTI (urinary tract infection) 01/24/2021   Renal mass, left 01/24/2021   Staghorn renal calculus 01/24/2021   Microscopic hematuria 01/24/2021   Aortic aneurysm (HCC) 01/24/2021   Closed burst fracture of lumbar vertebra (HCC) 01/24/2021   Acute respiratory failure with hypoxia (HCC) 08/13/2020   UTI (urinary tract infection) 06/02/2020   Community acquired pneumonia of right lower lobe of lung 06/02/2020   Acute hypoxemic respiratory failure (HCC) 06/02/2020    Hypokalemia 06/02/2020   Acute diastolic CHF (congestive heart failure) (HCC) 02/06/2020   Exertional shortness of breath 10/14/2019   Atrial fibrillation with RVR (HCC) 10/14/2019   Pleural effusion    Chronic heart failure with preserved ejection fraction [facility])     Past Surgical History:  Procedure Laterality Date   APPENDECTOMY     IR GUIDED DRAIN W CATHETER PLACEMENT  01/25/2021   IR NEPHROSTOMY EXCHANGE RIGHT  03/06/2021   IR NEPHROSTOMY PLACEMENT RIGHT  01/25/2021       Family History  Problem Relation Age of Onset   Aneurysm Father     Social History   Tobacco Use   Smoking status: Light Smoker   Smokeless tobacco: Never  Substance Use Topics   Alcohol use: Not Currently   Drug use: Never    Home Medications Prior to Admission medications   Medication Sig Start Date End Date Taking? Authorizing Provider  acetaminophen (TYLENOL) 325 MG tablet Take 650 mg by mouth at bedtime.    [provider]  albuterol (VENTOLIN HFA) 108 (90 Base) MCG/ACT inhaler Inhale 2 puffs into the lungs every 6 (six) hours as needed for wheezing or shortness of breath. 11/30/20   Hoy Register, MD  apixaban (ELIQUIS) 2.5 MG TABS tablet Take 1 tablet (2.5 mg total) by mouth 2 (two) times daily. 02/12/21 05/13/21  Steffanie Rainwater, MD  bisacodyl (DULCOLAX) 5 MG EC tablet Take 20 mg by mouth daily as needed for moderate constipation.    [provider]  diltiazem (CARDIZEM CD)  120 MG 24 hr capsule TAKE 1 CAPSULE(120 MG) BY MOUTH DAILY 03/28/21   Charlott Rakes, MD  furosemide (LASIX) 40 MG tablet Take 1 tablet (40 mg total) by mouth daily. 02/12/21 05/13/21  Lacinda Axon, MD  hydrOXYzine (ATARAX/VISTARIL) 10 MG tablet Take 1 tablet (10 mg total) by mouth 3 (three) times daily as needed for itching. 02/02/21   Madalyn Rob, MD  metoprolol tartrate (LOPRESSOR) 25 MG tablet TAKE 1 TABLET(25 MG) BY MOUTH TWICE DAILY 11/30/20   Charlott Rakes, MD  Misc. Devices [place] [employer] wheelchair.   Diagnosis atrial fibrillation, gait abnormality 12/01/20   Charlott Rakes, MD  nicotine (NICODERM CQ - DOSED IN MG/24 HR) 7 mg/24hr patch Place 1 patch (7 mg total) onto the skin daily at 6 (six) AM. 02/03/21   Madalyn Rob, MD  OVER THE COUNTER MEDICATION Take 1-2 tablets by mouth at bedtime. Sleeping pill    [provider]  polyethylene glycol powder (GLYCOLAX/MIRALAX) 17 GM/SCOOP powder Take 17 g by mouth daily as needed for mild constipation.    [provider]  tamsulosin (FLOMAX) 0.4 MG CAPS capsule Take 1 capsule (0.4 mg total) by mouth daily. 02/03/21   Madalyn Rob, MD  Tiotropium Bromide Monohydrate (SPIRIVA RESPIMAT) 2.5 MCG/ACT AERS INHALE 2 PUFFS BY MOUTH DAILY 11/30/20   Charlott Rakes, MD  Vitamin D, Ergocalciferol, (DRISDOL) 1.25 MG (50000 UNIT) CAPS capsule TAKE 1 CAPSULE BY MOUTH 1 TIME A WEEK 05/23/21   Charlott Rakes, MD    Allergies    Patient has no known allergies.  Review of Systems   Review of Systems  Unable to perform ROS: Dementia   Physical Exam Updated Vital Signs BP (!) 82/53   Pulse (!) 59   Temp 97.7 F (36.5 C) (Axillary)   Resp 20   SpO2 99%   Physical Exam Vitals and nursing note reviewed.  Constitutional:      General: He is not in acute distress.    Appearance: He is well-developed. He is not diaphoretic.  HENT:     Head: Normocephalic and atraumatic.  Eyes:     General: No scleral icterus.    Conjunctiva/sclera: Conjunctivae normal.  Cardiovascular:     Rate and Rhythm: Normal rate and regular rhythm.     Heart sounds: Normal heart sounds.  Pulmonary:     Effort: Pulmonary effort is normal. No respiratory distress.     Breath sounds: Normal breath sounds.  Abdominal:     Palpations: Abdomen is soft.     Tenderness: There is no abdominal tenderness.  Genitourinary:    Comments: Stool is dark in color Musculoskeletal:     Cervical back: Normal range of motion and neck supple.  Skin:    General: Skin is warm and dry.      Capillary Refill: Capillary refill takes less than 2 seconds.  Neurological:     Mental Status: He is alert.  Psychiatric:        Behavior: Behavior normal.    ED Results / Procedures / Treatments   Labs (all labs ordered are listed, but only abnormal results are displayed) Labs Reviewed  POC OCCULT BLOOD, ED - Abnormal; Notable for the following components:      Result Value   Fecal Occult Bld POSITIVE (*)    All other components within normal limits  URINE CULTURE  CULTURE, BLOOD (ROUTINE X 2)  CULTURE, BLOOD (ROUTINE X 2)  RESP PANEL BY RT-PCR (FLU A&B, COVID) ARPGX2  LACTIC ACID, PLASMA  LACTIC ACID, PLASMA  COMPREHENSIVE METABOLIC PANEL  CBC WITH DIFFERENTIAL/PLATELET  PROTIME-INR  APTT  URINALYSIS, ROUTINE W REFLEX MICROSCOPIC  TYPE AND SCREEN    EKG EKG Interpretation  Date/Time:  Monday June 26 2021 14:09:09 EDT Ventricular Rate:  45 PR Interval:    QRS Duration: 146 QT Interval:  454 QTC Calculation: 393 R Axis:   -58 Text Interpretation: Atrial fibrillation Left bundle branch block Confirmed by Ronnald Nian, Adam ([id number]) on 06/26/2021 2:11:54 PM  Radiology No results found.  Procedures Procedures   Medications Ordered in ED Medications  lactated ringers bolus 2,500 mL (has no administration in time range)  cefTRIAXone (ROCEPHIN) 2 g in sodium chloride 0.9 % 100 mL IVPB (has no administration in time range)  azithromycin (ZITHROMAX) 500 mg in sodium chloride 0.9 % 250 mL IVPB (has no administration in time range)    ED Course  I have reviewed the triage vital signs and the nursing notes.  Pertinent labs & imaging results that were available during my care of the patient were reviewed by me and considered in my medical decision making (see chart for details).    MDM Rules/Calculators/A&P                           85 year old male with dementia here for lethargy, recent diagnosis of pneumonia receiving IM Rocephin at his skilled nursing  facility.  Patient appears hypotensive.  EKG shows A. fib with bradycardia at a rate of 45.  Chest x-ray is pending, the only current lab resulted is a positive fecal occult stool.  Patient with sepsis versus GI bleed.  He is fluid responsive and receiving IV fluids.  He has gotten ceftriaxone and azithromycin.  Patient care signed out to PA [facility] at shift handoff. Final Clinical Impression(s) / ED Diagnoses Final diagnoses:  None    Rx / DC Orders ED Discharge Orders     None        Margarita Mail, PA-C 06/26/21 [place], [place], DO 06/26/21 1548

## 2021-06-26 NOTE — ED Notes (Signed)
pt mental status does not support PO meds at this time

## 2021-06-26 NOTE — ED Triage Notes (Signed)
PT BIB [facility] from [facility] for AMS.  PT more lethargic than normal per facility. No diagnoses of dementia but EMS states he behaves in dementia fashion.  TX began for PNA on 10/23.  Pt non-compliant with oral abx so IM Rocephin was begun 10/30 per EMS.   Pt was Covid + on 10/13.  EMS states lung sounds clear and diminished.  EMS VS 140/118 99% RA, H:62, CBG 205

## 2021-06-27 ENCOUNTER — Other Ambulatory Visit (HOSPITAL_COMMUNITY): Payer: Medicare Other

## 2021-06-27 DIAGNOSIS — R195 Other fecal abnormalities: Secondary | ICD-10-CM | POA: Diagnosis not present

## 2021-06-27 DIAGNOSIS — N179 Acute kidney failure, unspecified: Secondary | ICD-10-CM | POA: Diagnosis not present

## 2021-06-27 DIAGNOSIS — R410 Disorientation, unspecified: Secondary | ICD-10-CM | POA: Insufficient documentation

## 2021-06-27 DIAGNOSIS — N390 Urinary tract infection, site not specified: Secondary | ICD-10-CM | POA: Diagnosis not present

## 2021-06-27 DIAGNOSIS — J189 Pneumonia, unspecified organism: Secondary | ICD-10-CM | POA: Diagnosis not present

## 2021-06-27 DIAGNOSIS — I5032 Chronic diastolic (congestive) heart failure: Secondary | ICD-10-CM | POA: Diagnosis not present

## 2021-06-27 DIAGNOSIS — D509 Iron deficiency anemia, unspecified: Secondary | ICD-10-CM | POA: Diagnosis not present

## 2021-06-27 LAB — CBC WITH DIFFERENTIAL/PLATELET
Abs Immature Granulocytes: 0.04 10*3/uL (ref 0.00–0.07)
Basophils Absolute: 0 10*3/uL (ref 0.0–0.1)
Basophils Relative: 0 %
Eosinophils Absolute: 0 10*3/uL (ref 0.0–0.5)
Eosinophils Relative: 0 %
HCT: 32.3 % — ABNORMAL LOW (ref 39.0–52.0)
Hemoglobin: 11.1 g/dL — ABNORMAL LOW (ref 13.0–17.0)
Immature Granulocytes: 1 %
Lymphocytes Relative: 10 %
Lymphs Abs: 0.7 10*3/uL (ref 0.7–4.0)
MCH: 30 pg (ref 26.0–34.0)
MCHC: 34.4 g/dL (ref 30.0–36.0)
MCV: 87.3 fL (ref 80.0–100.0)
Monocytes Absolute: 0.5 10*3/uL (ref 0.1–1.0)
Monocytes Relative: 8 %
Neutro Abs: 5.5 10*3/uL (ref 1.7–7.7)
Neutrophils Relative %: 81 %
Platelets: 284 10*3/uL (ref 150–400)
RBC: 3.7 MIL/uL — ABNORMAL LOW (ref 4.22–5.81)
RDW: 15.9 % — ABNORMAL HIGH (ref 11.5–15.5)
WBC: 6.8 10*3/uL (ref 4.0–10.5)
nRBC: 0 % (ref 0.0–0.2)

## 2021-06-27 LAB — IRON AND TIBC
Iron: 58 ug/dL (ref 45–182)
Saturation Ratios: 29 % (ref 17.9–39.5)
TIBC: 197 ug/dL — ABNORMAL LOW (ref 250–450)
UIBC: 139 ug/dL

## 2021-06-27 LAB — CBC
HCT: 31.7 % — ABNORMAL LOW (ref 39.0–52.0)
Hemoglobin: 10.8 g/dL — ABNORMAL LOW (ref 13.0–17.0)
MCH: 29.4 pg (ref 26.0–34.0)
MCHC: 34.1 g/dL (ref 30.0–36.0)
MCV: 86.4 fL (ref 80.0–100.0)
Platelets: 258 10*3/uL (ref 150–400)
RBC: 3.67 MIL/uL — ABNORMAL LOW (ref 4.22–5.81)
RDW: 15.8 % — ABNORMAL HIGH (ref 11.5–15.5)
WBC: 6.6 10*3/uL (ref 4.0–10.5)
nRBC: 0 % (ref 0.0–0.2)

## 2021-06-27 LAB — VITAMIN D 25 HYDROXY (VIT D DEFICIENCY, FRACTURES): Vit D, 25-Hydroxy: 63.9 ng/mL (ref 30–100)

## 2021-06-27 LAB — TSH: TSH: 0.59 u[IU]/mL (ref 0.350–4.500)

## 2021-06-27 LAB — OSMOLALITY, URINE: Osmolality, Ur: 246 mOsm/kg — ABNORMAL LOW (ref 300–900)

## 2021-06-27 LAB — FOLATE: Folate: 18.4 ng/mL (ref >5.9)

## 2021-06-27 LAB — COMPREHENSIVE METABOLIC PANEL
ALT: 14 U/L (ref 0–44)
AST: 17 U/L (ref 15–41)
Albumin: 2.4 g/dL — ABNORMAL LOW (ref 3.5–5.0)
Alkaline Phosphatase: 65 U/L (ref 38–126)
Anion gap: 7 (ref 5–15)
BUN: 38 mg/dL — ABNORMAL HIGH (ref 8–23)
CO2: 17 mmol/L — ABNORMAL LOW (ref 22–32)
Calcium: 10.9 mg/dL — ABNORMAL HIGH (ref 8.9–10.3)
Chloride: 116 mmol/L — ABNORMAL HIGH (ref 98–111)
Creatinine, Ser: 2.61 mg/dL — ABNORMAL HIGH (ref 0.61–1.24)
GFR, Estimated: 24 mL/min — ABNORMAL LOW (ref >60)
Glucose, Bld: 119 mg/dL — ABNORMAL HIGH (ref 70–99)
Potassium: 3 mmol/L — ABNORMAL LOW (ref 3.5–5.1)
Sodium: 140 mmol/L (ref 135–145)
Total Bilirubin: 0.7 mg/dL (ref 0.3–1.2)
Total Protein: 5.1 g/dL — ABNORMAL LOW (ref 6.5–8.1)

## 2021-06-27 LAB — URINE CULTURE: Culture: NO GROWTH

## 2021-06-27 LAB — URINALYSIS, ROUTINE W REFLEX MICROSCOPIC
Bilirubin Urine: NEGATIVE
Glucose, UA: NEGATIVE mg/dL
Ketones, ur: NEGATIVE mg/dL
Nitrite: NEGATIVE
Protein, ur: NEGATIVE mg/dL
RBC / HPF: >50 RBC/hpf — ABNORMAL HIGH (ref 0–5)
Specific Gravity, Urine: 1.005 (ref 1.005–1.030)
pH: 7 (ref 5.0–8.0)

## 2021-06-27 LAB — PREPARE RBC (CROSSMATCH)

## 2021-06-27 LAB — TROPONIN I (HIGH SENSITIVITY): Troponin I (High Sensitivity): 25 ng/L — ABNORMAL HIGH (ref <18)

## 2021-06-27 LAB — CBG MONITORING, ED: Glucose-Capillary: 94 mg/dL (ref 70–99)

## 2021-06-27 LAB — RETICULOCYTES
Immature Retic Fract: 5.9 % (ref 2.3–15.9)
RBC.: 3.67 MIL/uL — ABNORMAL LOW (ref 4.22–5.81)
Retic Count, Absolute: 72.3 10*3/uL (ref 19.0–186.0)
Retic Ct Pct: 2 % (ref 0.4–3.1)

## 2021-06-27 LAB — VITAMIN B12: Vitamin B-12: 351 pg/mL (ref 180–914)

## 2021-06-27 LAB — FERRITIN: Ferritin: 79 ng/mL (ref 24–336)

## 2021-06-27 LAB — MAGNESIUM: Magnesium: 1.8 mg/dL (ref 1.7–2.4)

## 2021-06-27 LAB — SODIUM, URINE, RANDOM: Sodium, Ur: 26 mmol/L

## 2021-06-27 LAB — HEMOGLOBIN A1C
Hgb A1c MFr Bld: 5.5 % (ref 4.8–5.6)
Mean Plasma Glucose: 111.15 mg/dL

## 2021-06-27 LAB — CREATININE, URINE, RANDOM: Creatinine, Urine: 58.42 mg/dL

## 2021-06-27 LAB — PHOSPHORUS: Phosphorus: 2.9 mg/dL (ref 2.5–4.6)

## 2021-06-27 MED ORDER — SODIUM CHLORIDE 0.9 % IV SOLN: INTRAVENOUS | Status: AC

## 2021-06-27 MED ORDER — SODIUM CHLORIDE 0.9% IV SOLUTION: Freq: Once | INTRAVENOUS | Status: AC

## 2021-06-27 NOTE — Evaluation (Signed)
Clinical/Bedside Swallow Evaluation Patient Details  Name: ESAI STECKLEIN MRN: [id number] Date of Birth: Jun 10, 1936  Today's Date: 06/27/2021 Time: SLP Start Time (ACUTE ONLY): 0900 SLP Stop Time (ACUTE ONLY): 0913 SLP Time Calculation (min) (ACUTE ONLY): 13 min  Past Medical History:  Past Medical History:  Diagnosis Date   Acute heart failure with preserved ejection fraction (HCC)    Atrial fibrillation with rapid ventricular response (HCC) 07/11/2019   Past Surgical History:  Past Surgical History:  Procedure Laterality Date   APPENDECTOMY     IR GUIDED DRAIN W CATHETER PLACEMENT  01/25/2021   IR NEPHROSTOMY EXCHANGE RIGHT  03/06/2021   IR NEPHROSTOMY PLACEMENT RIGHT  01/25/2021   HPI:  85 y.o. male with medical history significant of Sepsis, diastolic CHF,    A.fib on eliquis, dementia, hx of staghorn calculus     Admitted for SEPSIS, symptomatic anemia, acute encephalopathy and hypercalcemia    Assessment / Plan / Recommendation  Clinical Impression  Pt demonstrates no immediate signs of dysphagia or aspiration. He is mildly agitated with activity, but willingly accepts sips of water and a few bites of puree. He would not accept any solids and does not have dentition and cannot answer questions regarding history or diet textures. Will initiate a dys 2 (finely chopped diet) which should be a palatable diet throughout admission. Will otherwise sign off. SLP Visit Diagnosis: Dysphagia, unspecified (R13.10)    Aspiration Risk  Mild aspiration risk    Diet Recommendation Dysphagia 2 (Fine chop);Thin liquid   Liquid Administration via: Cup;Straw Medication Administration: Whole meds with liquid Supervision: Staff to assist with self feeding Compensations: Minimize environmental distractions;Slow rate Postural Changes: Seated upright at 90 degrees    Other  Recommendations      Recommendations for follow up therapy are one component of a multi-disciplinary discharge planning  process, led by the attending physician.  Recommendations may be updated based on patient status, additional functional criteria and insurance authorization.  Follow up Recommendations        Frequency and Duration            Prognosis        Swallow Study   General HPI: 85 y.o. male with medical history significant of Sepsis, diastolic CHF,    A.fib on eliquis, dementia, hx of staghorn calculus     Admitted for SEPSIS, symptomatic anemia, acute encephalopathy and hypercalcemia Type of Study: Bedside Swallow Evaluation Previous Swallow Assessment: none Diet Prior to this Study: NPO Temperature Spikes Noted: No Respiratory Status: Nasal cannula History of Recent Intubation: No Behavior/Cognition: Alert;Requires cueing;Distractible;Confused Oral Cavity Assessment: Dry Oral Care Completed by SLP: No Oral Cavity - Dentition: Edentulous Self-Feeding Abilities: Needs assist Patient Positioning: Partially reclined Baseline Vocal Quality: Normal Volitional Cough: Cognitively unable to elicit Volitional Swallow: Unable to elicit    Oral/Motor/Sensory Function Overall Oral Motor/Sensory Function: Within functional limits   Ice Chips     Thin Liquid Thin Liquid: Within functional limits Presentation: Straw    Nectar Thick Nectar Thick Liquid: Not tested   Honey Thick Honey Thick Liquid: Not tested   Puree Puree: Within functional limits Presentation: Spoon   Solid     Solid: Not tested      Claudine Mouton 06/27/2021,9:30 AM

## 2021-06-27 NOTE — Progress Notes (Signed)
PROGRESS NOTE        PATIENT DETAILS Name: ROBET CRUTCHFIELD Age: 85 y.o. Sex: male Date of Birth: 1936-07-23 Admit Date: 06/26/2021 Admitting Physician Therisa Doyne, MD ONG:EXBMW, Denny Levy, MD  Brief Narrative: Patient is a 85 y.o. male with history of dementia, PAF on Eliquis, staghorn calculi-who was recently diagnosed with pneumonia at his nursing facility presented with altered mental status-upon further evaluation he was found to have hypercalcemia, AKI.  See below for further details.  Subjective: Initially very pleasantly confused-he was able to tell me his name-but subsequently started pushing me away.  No family at bedside.  Objective: Vitals: Blood pressure 121/90, pulse 91, temperature 97.9 F (36.6 C), temperature source Axillary, resp. rate (!) 32, height 6\' 1"  (1.854 m), weight 72.6 kg, SpO2 98 %.   Exam: Gen Exam: Confused but not in any distress. HEENT:atraumatic, normocephalic Chest: B/L clear to auscultation anteriorly CVS:S1S2 regular Abdomen: Difficult exam-but soft-appears nontender. Extremities:no edema Neurology: Seems to be moving all 4 extremities.  Difficult exam.  Skin: no rash  Pertinent Labs/Radiology: Recent Labs  Lab 06/26/21 1426 06/26/21 1948 06/27/21 0925  WBC 7.5   < > 6.8  HGB 9.6*   < > 11.1*  PLT 297   < > 284  NA 136   < > 140  K 2.7*   < > 3.0*  CREATININE 3.11*   < > 2.61*  AST 18  --  17  ALT 16  --  14  ALKPHOS 69  --  65  BILITOT 0.3  --  0.7   < > = values in this interval not displayed.    10/31>>Blood culture: No growth  10/31>>CXR: Chronic unchanged right-sided pleural effusion. 10/31>> CT head: No acute intracranial pathology, stable left frontotemporal hygroma. 10/31>> CT renal stone study: No acute abnormality, multiple large bilateral renal calculi as previously demonstrated.  Assessment/Plan: Acute metabolic encephalopathy: Suspect due to AKI/hypercalcemia-has dementia at  baseline and will be delirious during this hospital scores.  Continue to correct AKI/hypercalcemia and follow mental status.  CT head negative for acute abnormalities.  AKI on CKD stage IIIb: AKI likely hemodynamically mediated-due to hypercalcemia and hemodynamically mediated stress.  Creatinine level slowly improving with IV fluid hydration and correction of hypercalcemia.  CT abdomen without any hydronephrosis-has known renal calculi which seems to be at baseline.  Avoid nephrotoxic agents and follow renal function closely.  Hypercalcemia: Unclear etiology-improving with IV fluid hydration and calcitonin.  Await PTH, vitamin D levels.  Check PTH related peptide levels with a.m. labs.  ?  Aspiration pneumonia: Do not think patient had sepsis physiology on admission-chest x-ray does not look that different than usual baseline (has chronic right-sided pleural effusion).  Reasonable to continue antibiotics for a few more days.  Asymptomatic bacteriuria vs complicated UTI: UA grossly abnormal-unable to obtain history to see if he has symptoms consistent with UTI (demented/agitated).  Plan is to continue IV antibiotics, await culture-follow clinical course to decide if he actually needs to continue antibiotics  COVID-19 infection: Per ED notes and H&P-diagnosed 2 weeks ago-does not appear to have any active infection at this point.  No need for isolation.  Normocytic anemia: Suspect has a combination of iron deficiency and anemia of chronic disease at baseline-worsened likely due to IV fluid hydration-do not think he had any overt GI bleeding (although FOBT positive).  Hemoglobin now stable after 2 units of PRBC-continue to follow-up with CBC closely.  Eliquis remains on hold.  GI following   Chronic atrial fibrillation: Eliquis on hold-continue to watch closely.  ?Dementia/cognitive dysfunction at baseline: Confused-expect worsening delirium during this hospital stay.  Goals of care: Frail-does not  appear to be a great candidate for aggressive care-briefly discussed over the phone with his pastor-Charles Juliane Lack (point of contact officiate).  Understands long-term prognosis is poor-we will place a palliative care consult for continued goals of care discussion.  We will continue to engage with family-we will continue full code for now.  Procedures: None Consults:  DVT Prophylaxis:  Code Status:Full code  Family Communication: Lillard Anes (contact person/pastor) [phone] updated over the phone on 11/1  Time spent: 35 minutes-Greater than 50% of this time was spent in counseling, explanation of diagnosis, planning of further management, and coordination of care.   Disposition Plan: Status is: Inpatient  Remains inpatient appropriate because: Altered mental status not yet at baseline-hypercalcemia/AKI-on IV fluids/calcitonin injections.  Not stable for discharge.   Diet: Diet Order             DIET DYS 2 Room service appropriate? Yes; Fluid consistency: Thin  Diet effective now                     Antimicrobial agents: Anti-infectives (From admission, onward)    Start     Dose/Rate Route Frequency Ordered Stop   06/27/21 1800  azithromycin (ZITHROMAX) 500 mg in sodium chloride 0.9 % 250 mL IVPB        500 mg 250 mL/hr over 60 Minutes Intravenous Every 24 hours 06/26/21 1952 07/02/21 1759   06/27/21 1600  cefTRIAXone (ROCEPHIN) 2 g in sodium chloride 0.9 % 100 mL IVPB        2 g 200 mL/hr over 30 Minutes Intravenous Every 24 hours 06/26/21 1952 07/02/21 1559   06/26/21 1430  cefTRIAXone (ROCEPHIN) 2 g in sodium chloride 0.9 % 100 mL IVPB        2 g 200 mL/hr over 30 Minutes Intravenous  Once 06/26/21 1427 06/26/21 1700   06/26/21 1430  azithromycin (ZITHROMAX) 500 mg in sodium chloride 0.9 % 250 mL IVPB        500 mg 250 mL/hr over 60 Minutes Intravenous  Once 06/26/21 1427 06/26/21 1722        MEDICATIONS: Scheduled Meds:  acetaminophen  650 mg Oral  QHS   calcitonin  4 Units/kg Subcutaneous BID   pantoprazole (PROTONIX) IV  40 mg Intravenous Q12H   potassium chloride SA  40 mEq Oral Once   tamsulosin  0.4 mg Oral Daily   Continuous Infusions:  sodium chloride 10 mL/hr at 02/09/21 1450   sodium chloride 75 mL/hr at 06/27/21 0915   azithromycin     cefTRIAXone (ROCEPHIN)  IV     PRN Meds:.acetaminophen **OR** acetaminophen, albuterol, ondansetron **OR** ondansetron (ZOFRAN) IV   I have personally reviewed following labs and imaging studies  LABORATORY DATA: CBC: Recent Labs  Lab 06/26/21 1426 06/26/21 1948 06/26/21 2028 06/27/21 0441 06/27/21 0925  WBC 7.5 5.4  --  6.6 6.8  NEUTROABS 5.7  --   --   --  5.5  HGB 9.6* 7.3* 6.5* 10.8* 11.1*  HCT 28.5* 21.7* 19.0* 31.7* 32.3*  MCV 86.4 86.8  --  86.4 87.3  PLT 297 236  --  258 [id number]    Basic Metabolic Panel: Recent Labs  Lab 06/26/21 1426  06/26/21 1839 06/26/21 1948 06/26/21 2028 06/27/21 0925  NA 136  --  137 140 140  K 2.7*  --  3.0* 2.5* 3.0*  CL 110  --  112*  --  116*  CO2 17*  --  20*  --  17*  GLUCOSE 156*  --  102*  --  119*  BUN 47*  --  42*  --  38*  CREATININE 3.11*  --  2.69*  --  2.61*  CALCIUM 12.7*  --  12.3*  --  10.9*  MG  --  2.0  --   --  1.8  PHOS  --  4.0  --   --  2.9    GFR: Estimated Creatinine Clearance: 22 mL/min (A) (by C-G formula based on SCr of 2.61 mg/dL (H)).  Liver Function Tests: Recent Labs  Lab 06/26/21 1426 06/27/21 0925  AST 18 17  ALT 16 14  ALKPHOS 69 65  BILITOT 0.3 0.7  PROT 5.5* 5.1*  ALBUMIN 2.6* 2.4*   No results for input(s): LIPASE, AMYLASE in the last 168 hours. Recent Labs  Lab 06/26/21 2030  AMMONIA 21    Coagulation Profile: Recent Labs  Lab 06/26/21 1426  INR 1.5*    Cardiac Enzymes: Recent Labs  Lab 06/26/21 1839  CKTOTAL 8*    BNP (last 3 results) No results for input(s): PROBNP in the last 8760 hours.  Lipid Profile: No results for input(s): CHOL, HDL, LDLCALC, TRIG,  CHOLHDL, LDLDIRECT in the last 72 hours.  Thyroid Function Tests: Recent Labs    06/27/21 0441  TSH 0.590    Anemia Panel: Recent Labs    06/27/21 0441  VITAMINB12 351  FOLATE 18.4  FERRITIN 79  TIBC 197*  IRON 58  RETICCTPCT 2.0    Urine analysis:    Component Value Date/Time   COLORURINE STRAW (A) 06/27/2021 0106   APPEARANCEUR CLEAR 06/27/2021 0106   LABSPEC 1.005 06/27/2021 0106   PHURINE 7.0 06/27/2021 0106   GLUCOSEU NEGATIVE 06/27/2021 0106   HGBUR LARGE (A) 06/27/2021 0106   BILIRUBINUR NEGATIVE 06/27/2021 0106   BILIRUBINUR negative 06/27/2020 1140   KETONESUR NEGATIVE 06/27/2021 0106   PROTEINUR NEGATIVE 06/27/2021 0106   UROBILINOGEN 1.0 06/27/2020 1140   NITRITE NEGATIVE 06/27/2021 0106   LEUKOCYTESUR LARGE (A) 06/27/2021 0106    Sepsis Labs: Lactic Acid, Venous    Component Value Date/Time   LATICACIDVEN 1.1 06/26/2021 1815    MICROBIOLOGY: Recent Results (from the past 240 hour(s))  Blood Culture (routine x 2)     Status: None (Preliminary result)   Collection Time: 06/26/21  2:26 PM   Specimen: BLOOD LEFT FOREARM  Result Value Ref Range Status   Specimen Description BLOOD LEFT FOREARM  Final   Special Requests   Final    BOTTLES DRAWN AEROBIC AND ANAEROBIC Blood Culture results may not be optimal due to an excessive volume of blood received in culture bottles   Culture   Final    NO GROWTH < 24 HOURS Performed at [facility], [place] [address] [place], [place] [address]    Report Status PENDING  Incomplete  Blood Culture (routine x 2)     Status: None (Preliminary result)   Collection Time: 06/26/21  2:31 PM   Specimen: BLOOD RIGHT FOREARM  Result Value Ref Range Status   Specimen Description BLOOD RIGHT FOREARM  Final   Special Requests   Final    BOTTLES DRAWN AEROBIC AND ANAEROBIC Blood Culture adequate volume  Culture   Final    NO GROWTH < 24 HOURS Performed at [facility], [place] [address], [place], [place]  [address]    Report Status PENDING  Incomplete  Resp Panel by RT-PCR (Flu A&B, Covid) Nasopharyngeal Swab     Status: Abnormal   Collection Time: 06/26/21  3:06 PM   Specimen: Nasopharyngeal Swab; Nasopharyngeal(NP) swabs in vial transport medium  Result Value Ref Range Status   SARS Coronavirus 2 by RT PCR POSITIVE (A) NEGATIVE Final    Comment: RESULT CALLED TO, READ BACK BY AND VERIFIED WITH: Shon Hale RN [id number] 06/26/21 A BROWNING (NOTE) SARS-CoV-2 target nucleic acids are DETECTED.  The SARS-CoV-2 RNA is generally detectable in upper respiratory specimens during the acute phase of infection. Positive results are indicative of the presence of the identified virus, but do not rule out bacterial infection or co-infection with other pathogens not detected by the test. Clinical correlation with patient history and other diagnostic information is necessary to determine patient infection status. The expected result is Negative.  Fact Sheet for Patients: EntrepreneurPulse.com.au  Fact Sheet for Healthcare Providers: IncredibleEmployment.be  This test is not yet approved or cleared by the [place] FDA and  has been authorized for detection and/or diagnosis of SARS-CoV-2 by FDA under an Emergency Use Authorization (EUA).  This EUA will remain in effect (meaning this test can  be used) for the duration of  the COVID-19 declaration under Section 564(b)(1) of the Act, 21 U.S.C. section 360bbb-3(b)(1), unless the authorization is terminated or revoked sooner.     Influenza A by PCR NEGATIVE NEGATIVE Final   Influenza B by PCR NEGATIVE NEGATIVE Final    Comment: (NOTE) The Xpert Xpress SARS-CoV-2/FLU/RSV plus assay is intended as an aid in the diagnosis of influenza from Nasopharyngeal swab specimens and should not be used as a sole basis for treatment. Nasal washings and aspirates are unacceptable for Xpert Xpress SARS-CoV-2/FLU/RSV testing.  Fact  Sheet for Patients: EntrepreneurPulse.com.au  Fact Sheet for Healthcare Providers: IncredibleEmployment.be  This test is not yet approved or cleared by the [place] FDA and has been authorized for detection and/or diagnosis of SARS-CoV-2 by FDA under an Emergency Use Authorization (EUA). This EUA will remain in effect (meaning this test can be used) for the duration of the COVID-19 declaration under Section 564(b)(1) of the Act, 21 U.S.C. section 360bbb-3(b)(1), unless the authorization is terminated or revoked.  Performed at [facility], [place] [address], [place], [place] [address]     RADIOLOGY STUDIES/RESULTS: CT HEAD WO CONTRAST (5MM)  Result Date: 06/26/2021 CLINICAL DATA:  Altered mental status. EXAM: CT HEAD WITHOUT CONTRAST TECHNIQUE: Contiguous axial images were obtained from the base of the skull through the vertex without intravenous contrast. COMPARISON:  Head CT dated 01/24/2021. FINDINGS: Brain: Mild age-related atrophy and chronic microvascular ischemic changes. There is no acute intracranial hemorrhage. No mass effect or midline shift. Similar appearance of left frontotemporal low attenuating extra-axial collection, likely old hygroma. Vascular: No hyperdense vessel or unexpected calcification. Skull: Normal. Negative for fracture or focal lesion. Sinuses/Orbits: Mild diffuse mucoperiosteal thickening of paranasal sinuses. No air-fluid level. The mastoid air cells are clear. Other: None IMPRESSION: 1. No acute intracranial pathology. 2. Mild age-related atrophy and chronic microvascular ischemic changes. 3. Stable left frontotemporal low attenuating extra-axial collection, likely old hygroma. Electronically Signed   By: Anner Crete M.D.   On: 06/26/2021 23:25   DG Chest Port 1 View  Result Date: 06/26/2021 CLINICAL DATA:  Questionable sepsis - evaluate for abnormality More lethargic than normal. EXAM: PORTABLE CHEST 1 VIEW  COMPARISON:  Radiograph 03/01/2021.  Chest CT 01/24/2021 FINDINGS: Patient had difficulty tolerating the exam, and patient's hand partially obscures evaluation of the right upper chest. Similar volume loss to prior radiograph with right-sided pleural effusion. Slight increase fluid in the right minor fissure. Vague reticular opacities in the right perihilar lung. Unchanged heart size. Unchanged mediastinal contours with aortic atherosclerosis and tortuosity. No visualized left pleural effusion. No pneumothorax. IMPRESSION: Chronic unchanged right-sided pleural effusion with unchanged volume loss from prior radiograph. Vague reticular opacities in the right perihilar lung may be vascular or represent atypical infection. Pulmonary edema is felt less likely. Electronically Signed   By: Keith Rake M.D.   On: 06/26/2021 15:33   CT RENAL STONE STUDY  Result Date: 06/26/2021 CLINICAL DATA:  Acute renal failure EXAM: CT ABDOMEN AND PELVIS WITHOUT CONTRAST TECHNIQUE: Multidetector CT imaging of the abdomen and pelvis was performed following the standard protocol without IV contrast. COMPARISON:  None. FINDINGS: LOWER CHEST: Small right pleural effusion and right basilar atelectasis. HEPATOBILIARY: Normal hepatic contours. No intra- or extrahepatic biliary dilatation. Normal gallbladder. PANCREAS: Normal pancreas. No ductal dilatation or peripancreatic fluid collection. SPLEEN: Normal. ADRENALS/URINARY TRACT: The adrenal glands are normal. There are multiple large bilateral renal calculi, as previously demonstrated. On the right, stones extend into the renal pelvis and measure up to 4.7 cm. No hydronephrosis. The urinary bladder is normal for degree of distention STOMACH/BOWEL: There is no hiatal hernia. Normal duodenal course and caliber. No small bowel dilatation or inflammation. Moderate amount of stool in the colon. Normal appendix. VASCULAR/LYMPHATIC: There is calcific atherosclerosis of the abdominal aorta.  No lymphadenopathy. REPRODUCTIVE: There are calcifications within the normal-sized prostate. Symmetric seminal vesicles. MUSCULOSKELETAL. Chronic compression deformity at L1 OTHER: Small amount of right lower quadrant subcutaneous gas. IMPRESSION: 1. No acute abnormality of the abdomen or pelvis. 2. Multiple large bilateral renal calculi, as previously demonstrated. On the right, stones extend into the renal pelvis and measure up to 4.7 cm. No hydronephrosis. 3. Small right pleural effusion and right basilar atelectasis. Aortic Atherosclerosis (ICD10-I70.0). Electronically Signed   By: Ulyses Jarred M.D.   On: 06/26/2021 23:45     LOS: 1 day   Oren Binet, MD  [facility]    To contact the attending provider between 7A-7P or the covering provider during after hours 7P-7A, please log into the web site [email or web address] and access using universal [place] password for that web site. If you do not have the password, please call the hospital operator.  06/27/2021, 11:51 AM

## 2021-06-27 NOTE — Consult Note (Signed)
Referring Provider: [facility] PCP: Raymondo Band, MD  Gastroenterologist: Althia Forts Reason for consultation:    Anemia, FOBT+               ASSESSMENT / PLAN   # 85 yo male with dementia and acute metabolic encephalopathy admitted with sepsis, CAP, UTI, AKI on CKD, acute on chronic anemia with FOBT + on Eliquis. Iron studies not suggestive of IDA though he takes oral iron ( on home med list). Unable to obtain history from patient but no previous endoscopic studies found in Epic.          --Improvement in hgb after 2 units of blood.  --He will probably need at least an EGD at some point (after Eliquis washout and when acute medical problems improve) to evaluate anemia and Heme positive stools. I do not know if he would be able to prep for a colonoscopy. Will see if there is family or HCPOA to discuss  --Agree with BID IV PPI --Trend hgb. I expect it to decline some with equilibration at it rose nearly 4 grams with just 2 uPRBCs.   # COVID positive  # Sepsis / CAD / UTI --On iv antibiotics  # Additional medical history listed below.   HISTORY OF PRESENT ILLNESS                                                                                                                         Chief Complaint: none from patient  Alfred Mercado is a 85 y.o. male with a past medical history significant for dementia, recent COVID, hypertension, k A. fib on Eliquis, kidney stones, CKD 3B,  see PMH for any additional medical problems  ED course:  Patient presented to ED yesterday for evaluation of confusion and fatigue.  He was diagnosed with COVID two weeks ago. He was hypotensive.  He has been admitted with sepsis, acute encephalopathy, UTI, AKI, acute on chronic anemia.  Patient is unable to provide any history.  He is confused, does not follow commands, baseline hemoglobin around 9, now down to 6.5 after rehydration.  He received 2 units of blood earlier this morning with improvement in  hemoglobin to 10.8.  His stools are heme positive on Eliquis which he takes for atrial fibrillation.  Ferritin 79, TIBC low at 197 with normal iron saturation percentage of 29, B12 and folate are normal.  For altered mental status patient had a head CT which was negative for any acute abnormalities.  Noncontrast CT scan shows bilateral renal stones without hydronephrosis.  Gallbladder sludge.  He is hypercalcemic..  Albumin low at 2.6.  After IV fluids patient is hemodynamically stable.  Afebrile.  Blood and urine cultures are pending  Imaging:  CT HEAD WO CONTRAST (5MM)  Result Date: 06/26/2021 CLINICAL DATA:  Altered mental status. EXAM: CT HEAD WITHOUT CONTRAST TECHNIQUE: Contiguous axial images were obtained from the base of the skull through the vertex without intravenous contrast. COMPARISON:  Head  CT dated 01/24/2021. FINDINGS: Brain: Mild age-related atrophy and chronic microvascular ischemic changes. There is no acute intracranial hemorrhage. No mass effect or midline shift. Similar appearance of left frontotemporal low attenuating extra-axial collection, likely old hygroma. Vascular: No hyperdense vessel or unexpected calcification. Skull: Normal. Negative for fracture or focal lesion. Sinuses/Orbits: Mild diffuse mucoperiosteal thickening of paranasal sinuses. No air-fluid level. The mastoid air cells are clear. Other: None IMPRESSION: 1. No acute intracranial pathology. 2. Mild age-related atrophy and chronic microvascular ischemic changes. 3. Stable left frontotemporal low attenuating extra-axial collection, likely old hygroma. Electronically Signed   By: Anner Crete M.D.   On: 06/26/2021 23:25   DG Chest Port 1 View  Result Date: 06/26/2021 CLINICAL DATA:  Questionable sepsis - evaluate for abnormality More lethargic than normal. EXAM: PORTABLE CHEST 1 VIEW COMPARISON:  Radiograph 03/01/2021.  Chest CT 01/24/2021 FINDINGS: Patient had difficulty tolerating the exam, and patient's hand  partially obscures evaluation of the right upper chest. Similar volume loss to prior radiograph with right-sided pleural effusion. Slight increase fluid in the right minor fissure. Vague reticular opacities in the right perihilar lung. Unchanged heart size. Unchanged mediastinal contours with aortic atherosclerosis and tortuosity. No visualized left pleural effusion. No pneumothorax. IMPRESSION: Chronic unchanged right-sided pleural effusion with unchanged volume loss from prior radiograph. Vague reticular opacities in the right perihilar lung may be vascular or represent atypical infection. Pulmonary edema is felt less likely. Electronically Signed   By: Keith Rake M.D.   On: 06/26/2021 15:33   CT RENAL STONE STUDY  Result Date: 06/26/2021 CLINICAL DATA:  Acute renal failure EXAM: CT ABDOMEN AND PELVIS WITHOUT CONTRAST TECHNIQUE: Multidetector CT imaging of the abdomen and pelvis was performed following the standard protocol without IV contrast. COMPARISON:  None. FINDINGS: LOWER CHEST: Small right pleural effusion and right basilar atelectasis. HEPATOBILIARY: Normal hepatic contours. No intra- or extrahepatic biliary dilatation. Normal gallbladder. PANCREAS: Normal pancreas. No ductal dilatation or peripancreatic fluid collection. SPLEEN: Normal. ADRENALS/URINARY TRACT: The adrenal glands are normal. There are multiple large bilateral renal calculi, as previously demonstrated. On the right, stones extend into the renal pelvis and measure up to 4.7 cm. No hydronephrosis. The urinary bladder is normal for degree of distention STOMACH/BOWEL: There is no hiatal hernia. Normal duodenal course and caliber. No small bowel dilatation or inflammation. Moderate amount of stool in the colon. Normal appendix. VASCULAR/LYMPHATIC: There is calcific atherosclerosis of the abdominal aorta. No lymphadenopathy. REPRODUCTIVE: There are calcifications within the normal-sized prostate. Symmetric seminal vesicles.  MUSCULOSKELETAL. Chronic compression deformity at L1 OTHER: Small amount of right lower quadrant subcutaneous gas. IMPRESSION: 1. No acute abnormality of the abdomen or pelvis. 2. Multiple large bilateral renal calculi, as previously demonstrated. On the right, stones extend into the renal pelvis and measure up to 4.7 cm. No hydronephrosis. 3. Small right pleural effusion and right basilar atelectasis. Aortic Atherosclerosis (ICD10-I70.0). Electronically Signed   By: Ulyses Jarred M.D.   On: 06/26/2021 23:45       PREVIOUS ENDOSCOPIC EVALUATIONS  / IMAGING STUDIES     Past Medical History:  Diagnosis Date   Acute heart failure with preserved ejection fraction ([place])    Atrial fibrillation with rapid ventricular response ([place]) 07/11/2019    Past Surgical History:  Procedure Laterality Date   APPENDECTOMY     IR GUIDED DRAIN W CATHETER PLACEMENT  01/25/2021   IR NEPHROSTOMY EXCHANGE RIGHT  03/06/2021   IR NEPHROSTOMY PLACEMENT RIGHT  01/25/2021    Prior to Admission medications  Medication Sig Start Date End Date Taking? Authorizing Provider  acetaminophen (TYLENOL) 325 MG tablet Take 650 mg by mouth at bedtime.   Yes [provider]  apixaban (ELIQUIS) 2.5 MG TABS tablet Take 1 tablet (2.5 mg total) by mouth 2 (two) times daily. 02/12/21 06/26/21 Yes Amponsah, Charisse March, MD  busPIRone (BUSPAR) 5 MG tablet Take 2.5 mg by mouth daily.   Yes [provider]  cefTRIAXone (ROCEPHIN) 1 g injection Inject 1 g into the muscle daily.   Yes [provider]  diltiazem (CARDIZEM SR) 60 MG 12 hr capsule Take 60 mg by mouth every 12 (twelve) hours.   Yes [provider]  docusate sodium (COLACE) 100 MG capsule Take 100 mg by mouth every other day.   Yes [provider]  famotidine-calcium carbonate-magnesium hydroxide (PEPCID COMPLETE) 10-800-165 MG chewable tablet Chew 1 tablet by mouth daily.   Yes [provider]  Ferrous Gluconate 324 (37.5 Fe) MG  TABS Take 1 tablet by mouth every other day.   Yes [provider]  furosemide (LASIX) 20 MG tablet Take 10 mg by mouth.   Yes [provider]  melatonin 3 MG TABS tablet Take 6 mg by mouth at bedtime.   Yes [provider]  metoprolol tartrate (LOPRESSOR) 25 MG tablet TAKE 1 TABLET(25 MG) BY MOUTH TWICE DAILY Patient taking differently: Take 12.5 mg by mouth 2 (two) times daily. 11/30/20  Yes Charlott Rakes, MD  Multiple Vitamin (MULTIVITAMIN WITH MINERALS) TABS tablet Take 1 tablet by mouth daily.   Yes [provider]  potassium chloride (KLOR-CON) 10 MEQ tablet Take 10 mEq by mouth daily.   Yes [provider]  tamsulosin (FLOMAX) 0.4 MG CAPS capsule Take 1 capsule (0.4 mg total) by mouth daily. 02/03/21  Yes Madalyn Rob, MD  umeclidinium-vilanterol [facility] ELLIPTA) 62.5-25 MCG/ACT AEPB Inhale 1 puff into the lungs daily.   Yes [provider]  vitamin C (ASCORBIC ACID) 500 MG tablet Take 500 mg by mouth daily.   Yes [provider]  Vitamin D, Ergocalciferol, (DRISDOL) 1.25 MG (50000 UNIT) CAPS capsule TAKE 1 CAPSULE BY MOUTH 1 TIME A WEEK Patient taking differently: Take 50,000 Units by mouth every 7 (seven) days. 05/23/21  Yes Charlott Rakes, MD  albuterol (VENTOLIN HFA) 108 (90 Base) MCG/ACT inhaler Inhale 2 puffs into the lungs every 6 (six) hours as needed for wheezing or shortness of breath. Patient not taking: No sig reported 11/30/20   Charlott Rakes, MD  diltiazem (CARDIZEM CD) 120 MG 24 hr capsule TAKE 1 CAPSULE(120 MG) BY MOUTH DAILY Patient not taking: No sig reported 03/28/21   Charlott Rakes, MD  furosemide (LASIX) 40 MG tablet Take 1 tablet (40 mg total) by mouth daily. Patient not taking: No sig reported 02/12/21 06/26/21  Lacinda Axon, MD  hydrOXYzine (ATARAX/VISTARIL) 10 MG tablet Take 1 tablet (10 mg total) by mouth 3 (three) times daily as needed for itching. Patient not taking: Reported on 06/26/2021 02/02/21    Madalyn Rob, MD  Misc. Devices [place] [employer] wheelchair.  Diagnosis atrial fibrillation, gait abnormality 12/01/20   Charlott Rakes, MD  nicotine (NICODERM CQ - DOSED IN MG/24 HR) 7 mg/24hr patch Place 1 patch (7 mg total) onto the skin daily at 6 (six) AM. Patient not taking: Reported on 06/26/2021 02/03/21   Madalyn Rob, MD  Tiotropium Bromide Monohydrate (SPIRIVA RESPIMAT) 2.5 MCG/ACT AERS INHALE 2 PUFFS BY MOUTH DAILY Patient not taking: No sig reported 11/30/20   Newlin,  Odette Horns, MD    Current Facility-Administered Medications  Medication Dose Route Frequency Provider Last Rate Last Admin   0.9 %  sodium chloride infusion   Intravenous Continuous Dolan Amen, MD 10 mL/hr at 02/09/21 1450 New Bag at 02/09/21 1450   0.9 %  sodium chloride infusion   Intravenous Continuous Ghimire, Werner Lean, MD       acetaminophen (TYLENOL) tablet 650 mg  650 mg Oral Q6H PRN Therisa Doyne, MD       Or   acetaminophen (TYLENOL) suppository 650 mg  650 mg Rectal Q6H PRN Therisa Doyne, MD       acetaminophen (TYLENOL) tablet 650 mg  650 mg Oral QHS Therisa Doyne, MD   650 mg at 06/26/21 2155   albuterol (VENTOLIN HFA) 108 (90 Base) MCG/ACT inhaler 1 puff  1 puff Inhalation Q2H PRN Doutova, Anastassia, MD       azithromycin (ZITHROMAX) 500 mg in sodium chloride 0.9 % 250 mL IVPB  500 mg Intravenous Q24H Doutova, Anastassia, MD       calcitonin (MIACALCIN) injection 290 Units  4 Units/kg Subcutaneous BID Therisa Doyne, MD   290 Units at 06/26/21 2252   cefTRIAXone (ROCEPHIN) 2 g in sodium chloride 0.9 % 100 mL IVPB  2 g Intravenous Q24H Doutova, Anastassia, MD       ondansetron (ZOFRAN) tablet 4 mg  4 mg Oral Q6H PRN Doutova, Anastassia, MD       Or   ondansetron (ZOFRAN) injection 4 mg  4 mg Intravenous Q6H PRN Doutova, Anastassia, MD       pantoprazole (PROTONIX) injection 40 mg  40 mg Intravenous Q12H Doutova, Anastassia, MD   40 mg at 06/26/21 2222   potassium chloride SA (KLOR-CON)  CR tablet 40 mEq  40 mEq Oral Once Therisa Doyne, MD       tamsulosin (FLOMAX) capsule 0.4 mg  0.4 mg Oral Daily Doutova, Anastassia, MD       Current Outpatient Medications  Medication Sig Dispense Refill   acetaminophen (TYLENOL) 325 MG tablet Take 650 mg by mouth at bedtime.     apixaban (ELIQUIS) 2.5 MG TABS tablet Take 1 tablet (2.5 mg total) by mouth 2 (two) times daily. 60 tablet 2   busPIRone (BUSPAR) 5 MG tablet Take 2.5 mg by mouth daily.     cefTRIAXone (ROCEPHIN) 1 g injection Inject 1 g into the muscle daily.     diltiazem (CARDIZEM SR) 60 MG 12 hr capsule Take 60 mg by mouth every 12 (twelve) hours.     docusate sodium (COLACE) 100 MG capsule Take 100 mg by mouth every other day.     famotidine-calcium carbonate-magnesium hydroxide (PEPCID COMPLETE) 10-800-165 MG chewable tablet Chew 1 tablet by mouth daily.     Ferrous Gluconate 324 (37.5 Fe) MG TABS Take 1 tablet by mouth every other day.     furosemide (LASIX) 20 MG tablet Take 10 mg by mouth.     melatonin 3 MG TABS tablet Take 6 mg by mouth at bedtime.     metoprolol tartrate (LOPRESSOR) 25 MG tablet TAKE 1 TABLET(25 MG) BY MOUTH TWICE DAILY (Patient taking differently: Take 12.5 mg by mouth 2 (two) times daily.) 60 tablet 6   Multiple Vitamin (MULTIVITAMIN WITH MINERALS) TABS tablet Take 1 tablet by mouth daily.     potassium chloride (KLOR-CON) 10 MEQ tablet Take 10 mEq by mouth daily.     tamsulosin (FLOMAX) 0.4 MG CAPS capsule Take 1 capsule (0.4 mg total) by  mouth daily. 30 capsule 0   umeclidinium-vilanterol (ANORO ELLIPTA) 62.5-25 MCG/ACT AEPB Inhale 1 puff into the lungs daily.     vitamin C (ASCORBIC ACID) 500 MG tablet Take 500 mg by mouth daily.     Vitamin D, Ergocalciferol, (DRISDOL) 1.25 MG (50000 UNIT) CAPS capsule TAKE 1 CAPSULE BY MOUTH 1 TIME A WEEK (Patient taking differently: Take 50,000 Units by mouth every 7 (seven) days.) 12 capsule 0   albuterol (VENTOLIN HFA) 108 (90 Base) MCG/ACT inhaler Inhale  2 puffs into the lungs every 6 (six) hours as needed for wheezing or shortness of breath. (Patient not taking: No sig reported) 18 g 6   diltiazem (CARDIZEM CD) 120 MG 24 hr capsule TAKE 1 CAPSULE(120 MG) BY MOUTH DAILY (Patient not taking: No sig reported) 30 capsule 3   furosemide (LASIX) 40 MG tablet Take 1 tablet (40 mg total) by mouth daily. (Patient not taking: No sig reported) 30 tablet 1   hydrOXYzine (ATARAX/VISTARIL) 10 MG tablet Take 1 tablet (10 mg total) by mouth 3 (three) times daily as needed for itching. (Patient not taking: Reported on 06/26/2021) 30 tablet 0   Misc. Devices [place] [employer] wheelchair.  Diagnosis atrial fibrillation, gait abnormality 1 each 0   nicotine (NICODERM CQ - DOSED IN MG/24 HR) 7 mg/24hr patch Place 1 patch (7 mg total) onto the skin daily at 6 (six) AM. (Patient not taking: Reported on 06/26/2021) 28 patch 0   Tiotropium Bromide Monohydrate (SPIRIVA RESPIMAT) 2.5 MCG/ACT AERS INHALE 2 PUFFS BY MOUTH DAILY (Patient not taking: No sig reported) 4 g 6    Allergies as of 06/26/2021   (No Known Allergies)    Family History  Problem Relation Age of Onset   Aneurysm Father     Social History   Socioeconomic History   Marital status: Divorced    Spouse name: Not on file   Number of children: Not on file   Years of education: Not on file   Highest education level: Not on file  Occupational History   Not on file  Tobacco Use   Smoking status: Light Smoker   Smokeless tobacco: Never  Substance and Sexual Activity   Alcohol use: Not Currently   Drug use: Never   Sexual activity: Not Currently  Other Topics Concern   Not on file  Social History Narrative   Not on file   Social Determinants of Health   Financial Resource Strain: Not on file  Food Insecurity: Not on file  Transportation Needs: Not on file  Physical Activity: Not on file  Stress: Not on file  Social Connections: Not on file  Intimate Partner Violence: Not on file    Review of  Systems: Unable to obtain due to underlying dementia and acute encephalopathy  OBJECTIVE    Physical Exam: Vital signs in last 24 hours: Temp:  [97.7 F (36.5 C)-98.1 F (36.7 C)] 97.9 F (36.6 C) (11/01 0600) Pulse Rate:  [58-92] 82 (11/01 0600) Resp:  [15-27] 16 (11/01 0600) BP: (82-124)/(50-81) 110/53 (11/01 0600) SpO2:  [90 %-100 %] 98 % (11/01 0600) Weight:  [72.6 kg] 72.6 kg (10/31 2200)    General:  Alert thin male in NAD Psych: Does not follow commands.  Not combative Eyes: Pupils equal, no icterus. Conjunctive pink Ears:  Normal auditory acuity Nose: No deformity, discharge or lesions Neck:  Supple, no masses felt Lungs:  Clear to auscultation.  Heart: Difficult to hear as patient keeps mumbling during auscultation  No lower  extremity edema Abdomen: Abdomen is soft, nondistended .  Unable to tell if there is any abdominal tenderness as patient keeps arms folded over abdomen and continuously mumbling during exam .  Rectal :  Deferred Msk: Symmetrical without gross deformities.  Neurologic:  Alert, not oriented.   Skin:  Intact without significant lesions.    Scheduled inpatient medications  acetaminophen  650 mg Oral QHS   calcitonin  4 Units/kg Subcutaneous BID   pantoprazole (PROTONIX) IV  40 mg Intravenous Q12H   potassium chloride SA  40 mEq Oral Once   tamsulosin  0.4 mg Oral Daily      Intake/Output from previous day: 10/31 0701 - 11/01 0700 In: 3130 [Blood:630; IV Piggyback:2500] Out: 575 [Urine:575] Intake/Output this shift: Total I/O In: -  Out: 900 [Urine:900]   Lab Results: Recent Labs    06/26/21 1426 06/26/21 1948 06/26/21 2028 06/27/21 0441  WBC 7.5 5.4  --  6.6  HGB 9.6* 7.3* 6.5* 10.8*  HCT 28.5* 21.7* 19.0* 31.7*  PLT 297 236  --  258   BMET Recent Labs    06/26/21 1426 06/26/21 1948 06/26/21 2028  NA 136 137 140  K 2.7* 3.0* 2.5*  CL 110 112*  --   CO2 17* 20*  --   GLUCOSE 156* 102*  --   BUN 47* 42*  --    CREATININE 3.11* 2.69*  --   CALCIUM 12.7* 12.3*  --    LFTs Recent Labs    06/26/21 1426  PROT 5.5*  ALBUMIN 2.6*  AST 18  ALT 16  ALKPHOS 69  BILITOT 0.3   PT/INR Recent Labs    06/26/21 1426  LABPROT 18.5*  INR 1.5*   Hepatitis Panel No results for input(s): HEPBSAG, HCVAB, HEPAIGM, HEPBIGM in the last 72 hours.   . CBC Latest Ref Rng & Units 06/27/2021 06/26/2021 06/26/2021  WBC 4.0 - 10.5 K/uL 6.6 - 5.4  Hemoglobin 13.0 - 17.0 g/dL 10.8(L) 6.5(LL) 7.3(L)  Hematocrit 39.0 - 52.0 % 31.7(L) 19.0(L) 21.7(L)  Platelets 150 - 400 K/uL 258 - 236    . CMP Latest Ref Rng & Units 06/26/2021 06/26/2021 06/26/2021  Glucose 70 - 99 mg/dL - 102(H) 156(H)  BUN 8 - 23 mg/dL - 42(H) 47(H)  Creatinine 0.61 - 1.24 mg/dL - 2.69(H) 3.11(H)  Sodium 135 - 145 mmol/L 140 137 136  Potassium 3.5 - 5.1 mmol/L 2.5(LL) 3.0(L) 2.7(LL)  Chloride 98 - 111 mmol/L - 112(H) 110  CO2 22 - 32 mmol/L - 20(L) 17(L)  Calcium 8.9 - 10.3 mg/dL - 12.3(H) 12.7(H)  Total Protein 6.5 - 8.1 g/dL - - 5.5(L)  Total Bilirubin 0.3 - 1.2 mg/dL - - 0.3  Alkaline Phos 38 - 126 U/L - - 69  AST 15 - 41 U/L - - 18  ALT 0 - 44 U/L - - 16     Active Problems:   Chronic heart failure with preserved ejection fraction ([facility])   UTI (urinary tract infection)   Hypokalemia   Acute-on-chronic kidney injury ([place])   Complicated UTI (urinary tract infection)   Severe sepsis ([facility])   Iron deficiency anemia   Generalized weakness   Hypotension   CAP (community acquired pneumonia)   COVID-19 virus infection   Occult blood positive stool   Hypercalcemia   Elevated troponin    Tye Savoy, NP-C @  06/27/2021, 9:10 AM

## 2021-06-27 NOTE — ED Notes (Signed)
Pt attempting to pull out IV at this time. Safety mitten applied

## 2021-06-27 NOTE — Progress Notes (Signed)
Patient arrived to room 5W06. Alert and oriented to self. Assessed skin with second RN. Observed scattered bruises to bilateral arms, and three abrasions to left arm. Foley catheter in place output observed clear, dark, yellow urine. Oriented to room, bed control, and call bell. Placed bed in lowest position. Continuing airborne/contact precautions.   06/27/21 1631  Vitals  Temp  (pt refused)  Temp Source  (pt refused oral and axillary)  BP 111/84  MAP (mmHg) 92  BP Location Right Arm  BP Method Automatic  Patient Position (if appropriate) Lying  Pulse Rate 97  ECG Heart Rate (!) 105  Resp 20  Level of Consciousness  Level of Consciousness Alert  MEWS COLOR  MEWS Score Color Green  Oxygen Therapy  SpO2 95 %  O2 Device Room Air  Pain Assessment  Pain Score 0  MEWS Score  MEWS Temp 0  MEWS Systolic 0  MEWS Pulse 1  MEWS RR 0  MEWS LOC 0  MEWS Score 1

## 2021-06-27 NOTE — ED Notes (Signed)
ED TO INPATIENT HANDOFF REPORT  ED Nurse Name and Phone #: McKaley [phone]  S Name/Age/Gender Alfred Mercado 85 y.o. male Room/Bed: 040C/040C  Code Status   Code Status: Full Code  Home/SNF/Other Nursing Home Pt disoriented x 4  Is this baseline? Yes   Triage Complete: Triage complete  Chief Complaint CAP (community acquired pneumonia) [J18.9]  Triage Note PT BIB [facility] from [place] for AMS.  PT more lethargic than normal per facility. No diagnoses of dementia but EMS states he behaves in dementia fashion.  TX began for PNA on 10/23.  Pt non-compliant with oral abx so IM Rocephin was begun 10/30 per EMS.   Pt was Covid + on 10/13.  EMS states lung sounds clear and diminished.  EMS VS 140/118 99% RA, H:62, CBG 205   Allergies No Known Allergies  Level of Care/Admitting Diagnosis ED Disposition     ED Disposition  Admit   Condition  --   Comment  Hospital Area: [facility] [[id number]]  Level of Care: Progressive [102]  Admit to Progressive based on following criteria: NEUROLOGICAL AND NEUROSURGICAL complex patients with significant risk of instability, who do not meet ICU criteria, yet require close observation or frequent assessment (< / = every 2 - 4 hours) with medical / nursing intervention.  Admit to Progressive based on following criteria: MULTISYSTEM THREATS such as stable sepsis, metabolic/electrolyte imbalance with or without encephalopathy that is responding to early treatment.  May admit patient to [facility] if equivalent level of care is available:: No  Covid Evaluation: Confirmed COVID Positive  Diagnosis: CAP (community acquired pneumonia) [454098]  Admitting Physician: Therisa Doyne [[id number]]  Attending Physician: Therisa Doyne [[id number]]  Estimated length of stay: past midnight tomorrow  Certification:: I certify this patient will need inpatient services for at least 2 midnights          B Medical/Surgery  History Past Medical History:  Diagnosis Date   Acute heart failure with preserved ejection fraction (HCC)    Atrial fibrillation with rapid ventricular response (HCC) 07/11/2019   Past Surgical History:  Procedure Laterality Date   APPENDECTOMY     IR GUIDED DRAIN W CATHETER PLACEMENT  01/25/2021   IR NEPHROSTOMY EXCHANGE RIGHT  03/06/2021   IR NEPHROSTOMY PLACEMENT RIGHT  01/25/2021     A IV Location/Drains/Wounds Patient Lines/Drains/Airways Status     Active Line/Drains/Airways     Name Placement date Placement time Site Days   Peripheral IV 03/09/21 22 G Anterior;Left Forearm 03/09/21  1641  Forearm  110   Peripheral IV 06/26/21 20 G Right;Anterior Forearm 06/26/21  1517  Forearm  1   Peripheral IV 06/26/21 20 G Right Antecubital 06/26/21  1938  Antecubital  1   Closed System Drain 1 Left;Posterior Back 10.2 Fr. 01/25/21  1254  Back  153   Nephrostomy Right 10 Fr. 03/06/21  1106  Right  113   Urethral Catheter A Sines RN Straight-tip 14 Fr. 06/27/21  0107  Straight-tip  less than 1   Wound / Incision (Open or Dehisced) 01/25/21 Puncture Back Left;Posterior;Lower;Lateral peri-renal drain insertion site 01/25/21  1305  Back  153   Wound / Incision (Open or Dehisced) 01/25/21 Puncture Back Right;Posterior;Lower;Lateral right kidney drain insertion site  01/25/21  1305  Back  153            Intake/Output Last 24 hours  Intake/Output Summary (Last 24 hours) at 06/27/2021 1428 Last data filed at 06/27/2021 0707 Gross per  24 hour  Intake 3130 ml  Output 1475 ml  Net 1655 ml    Labs/Imaging Results for orders placed or performed during the hospital encounter of 06/26/21 (from the past 48 hour(s))  Urine Culture     Status: None   Collection Time: 06/26/21  2:09 PM   Specimen: In/Out Cath Urine  Result Value Ref Range   Specimen Description IN/OUT CATH URINE    Special Requests NONE    Culture      NO GROWTH Performed at [facility], [address],  [place], [place] [address]    Report Status 06/27/2021 FINAL   Comprehensive metabolic panel     Status: Abnormal   Collection Time: 06/26/21  2:26 PM  Result Value Ref Range   Sodium 136 135 - 145 mmol/L   Potassium 2.7 (LL) 3.5 - 5.1 mmol/L    Comment: CRITICAL RESULT CALLED TO, READ BACK BY AND VERIFIED WITH: J NEWTON RN BY SSTEPHENS [id number]    Chloride 110 98 - 111 mmol/L   CO2 17 (L) 22 - 32 mmol/L   Glucose, Bld 156 (H) 70 - 99 mg/dL    Comment: Glucose reference range applies only to samples taken after fasting for at least 8 hours.   BUN 47 (H) 8 - 23 mg/dL   Creatinine, Ser 7.34 (H) 0.61 - 1.24 mg/dL   Calcium 28.7 (H) 8.9 - 10.3 mg/dL   Total Protein 5.5 (L) 6.5 - 8.1 g/dL   Albumin 2.6 (L) 3.5 - 5.0 g/dL   AST 18 15 - 41 U/L   ALT 16 0 - 44 U/L   Alkaline Phosphatase 69 38 - 126 U/L   Total Bilirubin 0.3 0.3 - 1.2 mg/dL   GFR, Estimated 19 (L) >60 mL/min    Comment: (NOTE) Calculated using the CKD-EPI Creatinine Equation (2021)    Anion gap 9 5 - 15    Comment: Performed at [facility], [address] [place], [place] [address]  CBC WITH DIFFERENTIAL     Status: Abnormal   Collection Time: 06/26/21  2:26 PM  Result Value Ref Range   WBC 7.5 4.0 - 10.5 K/uL   RBC 3.30 (L) 4.22 - 5.81 MIL/uL   Hemoglobin 9.6 (L) 13.0 - 17.0 g/dL   HCT 72.6 (L) 20.3 - 55.9 %   MCV 86.4 80.0 - 100.0 fL   MCH 29.1 26.0 - 34.0 pg   MCHC 33.7 30.0 - 36.0 g/dL   RDW 74.1 (H) 63.8 - 45.3 %   Platelets 297 150 - 400 K/uL   nRBC 0.0 0.0 - 0.2 %   Neutrophils Relative % 75 %   Neutro Abs 5.7 1.7 - 7.7 K/uL   Lymphocytes Relative 11 %   Lymphs Abs 0.9 0.7 - 4.0 K/uL   Monocytes Relative 11 %   Monocytes Absolute 0.8 0.1 - 1.0 K/uL   Eosinophils Relative 1 %   Eosinophils Absolute 0.1 0.0 - 0.5 K/uL   Basophils Relative 1 %   Basophils Absolute 0.1 0.0 - 0.1 K/uL   Immature Granulocytes 1 %   Abs Immature Granulocytes 0.05 0.00 - 0.07 K/uL    Comment: Performed at [facility], [address] [place], [place] [address]  Protime-INR     Status: Abnormal   Collection Time: 06/26/21  2:26 PM  Result Value Ref Range   Prothrombin Time 18.5 (H) 11.4 - 15.2 seconds   INR 1.5 (H) 0.8 - 1.2  Comment: (NOTE) INR goal varies based on device and disease states. Performed at [facility], [address] [place], [place] [address]   APTT     Status: None   Collection Time: 06/26/21  2:26 PM  Result Value Ref Range   aPTT 31 24 - 36 seconds    Comment: Performed at [facility], [address] [place], [place] [address]  Blood Culture (routine x 2)     Status: None (Preliminary result)   Collection Time: 06/26/21  2:26 PM   Specimen: BLOOD LEFT FOREARM  Result Value Ref Range   Specimen Description BLOOD LEFT FOREARM    Special Requests      BOTTLES DRAWN AEROBIC AND ANAEROBIC Blood Culture results may not be optimal due to an excessive volume of blood received in culture bottles   Culture      NO GROWTH < 24 HOURS Performed at [facility], [address] [place], [place] [address]    Report Status PENDING   Lactic acid, plasma     Status: Abnormal   Collection Time: 06/26/21  2:31 PM  Result Value Ref Range   Lactic Acid, Venous 2.0 (HH) 0.5 - 1.9 mmol/L    Comment: CRITICAL RESULT CALLED TO, READ BACK BY AND VERIFIED WITH: Merlinda Frederick RN BY SSTEPHENS [phone] [id number] Performed at [facility], [address], [place], [place] [address]   Blood Culture (routine x 2)     Status: None (Preliminary result)   Collection Time: 06/26/21  2:31 PM   Specimen: BLOOD RIGHT FOREARM  Result Value Ref Range   Specimen Description BLOOD RIGHT FOREARM    Special Requests      BOTTLES DRAWN AEROBIC AND ANAEROBIC Blood Culture adequate volume   Culture      NO GROWTH < 24 HOURS Performed at [facility], [address] [place], [place] [address]    Report Status PENDING   POC occult blood, ED     Status: Abnormal   Collection Time:  06/26/21  2:40 PM  Result Value Ref Range   Fecal Occult Bld POSITIVE (A) NEGATIVE  Urinalysis, Routine w reflex microscopic Nasopharyngeal Swab     Status: Abnormal   Collection Time: 06/26/21  2:49 PM  Result Value Ref Range   Color, Urine YELLOW YELLOW   APPearance CLOUDY (A) CLEAR   Specific Gravity, Urine 1.009 1.005 - 1.030   pH 6.0 5.0 - 8.0   Glucose, UA NEGATIVE NEGATIVE mg/dL   Hgb urine dipstick LARGE (A) NEGATIVE   Bilirubin Urine NEGATIVE NEGATIVE   Ketones, ur NEGATIVE NEGATIVE mg/dL   Protein, ur NEGATIVE NEGATIVE mg/dL   Nitrite NEGATIVE NEGATIVE   Leukocytes,Ua LARGE (A) NEGATIVE   RBC / HPF >50 (H) 0 - 5 RBC/hpf   WBC, UA >50 (H) 0 - 5 WBC/hpf   Bacteria, UA RARE (A) NONE SEEN   Squamous Epithelial / LPF 11-20 0 - 5   WBC Clumps PRESENT    Mucus PRESENT    Hyaline Casts, UA PRESENT    Ca Oxalate Crys, UA PRESENT     Comment: Performed at [facility], [address], [place], [place] [address]  Creatinine, urine, random     Status: None   Collection Time: 06/26/21  2:49 PM  Result Value Ref Range   Creatinine, Urine 58.42 mg/dL    Comment: Performed at [facility], [address] [place], [place] [address]  Sodium, urine, random     Status: None   Collection Time: 06/26/21  2:49 PM  Result Value Ref Range   Sodium, Ur 26 mmol/L    Comment: Performed at [facility], [address] [place], [place] [address]  Resp Panel by RT-PCR (Flu A&B, Covid) Nasopharyngeal Swab     Status: Abnormal   Collection Time: 06/26/21  3:06 PM   Specimen: Nasopharyngeal Swab; Nasopharyngeal(NP) swabs in vial transport medium  Result Value Ref Range   SARS Coronavirus 2 by RT PCR POSITIVE (A) NEGATIVE    Comment: RESULT CALLED TO, READ BACK BY AND VERIFIED WITH: Merlinda Frederick RN 1736 06/26/21 A BROWNING (NOTE) SARS-CoV-2 target nucleic acids are DETECTED.  The SARS-CoV-2 RNA is generally detectable in upper respiratory specimens during the acute phase of  infection. Positive results are indicative of the presence of the identified virus, but do not rule out bacterial infection or co-infection with other pathogens not detected by the test. Clinical correlation with patient history and other diagnostic information is necessary to determine patient infection status. The expected result is Negative.  Fact Sheet for Patients: BloggerCourse.com  Fact Sheet for Healthcare Providers: SeriousBroker.it  This test is not yet approved or cleared by the [place] FDA and  has been authorized for detection and/or diagnosis of SARS-CoV-2 by FDA under an Emergency Use Authorization (EUA).  This EUA will remain in effect (meaning this test can  be used) for the duration of  the COVID-19 declaration under Section 564(b)(1) of the Act, 21 U.S.C. section 360bbb-3(b)(1), unless the authorization is terminated or revoked sooner.     Influenza A by PCR NEGATIVE NEGATIVE   Influenza B by PCR NEGATIVE NEGATIVE    Comment: (NOTE) The Xpert Xpress SARS-CoV-2/FLU/RSV plus assay is intended as an aid in the diagnosis of influenza from Nasopharyngeal swab specimens and should not be used as a sole basis for treatment. Nasal washings and aspirates are unacceptable for Xpert Xpress SARS-CoV-2/FLU/RSV testing.  Fact Sheet for Patients: BloggerCourse.com  Fact Sheet for Healthcare Providers: SeriousBroker.it  This test is not yet approved or cleared by the [place] FDA and has been authorized for detection and/or diagnosis of SARS-CoV-2 by FDA under an Emergency Use Authorization (EUA). This EUA will remain in effect (meaning this test can be used) for the duration of the COVID-19 declaration under Section 564(b)(1) of the Act, 21 U.S.C. section 360bbb-3(b)(1), unless the authorization is terminated or revoked.  Performed at [facility],  [address], [place], [place] [address]   Type and screen     Status: None (Preliminary result)   Collection Time: 06/26/21  4:02 PM  Result Value Ref Range   ABO/RH(D) O POS    Antibody Screen NEG    Sample Expiration 06/29/2021,2359    Unit Number [id number]    Blood Component Type RED CELLS,LR    Unit division 00    Status of Unit ISSUED,FINAL    Transfusion Status OK TO TRANSFUSE    Crossmatch Result      Compatible Performed at [facility], [address] [place], [place] [address]    Unit Number [id number]    Blood Component Type RBC LR PHER1    Unit division 00    Status of Unit ISSUED    Transfusion Status OK TO TRANSFUSE    Crossmatch Result Compatible   Lactic acid, plasma     Status: None   Collection Time: 06/26/21  6:15 PM  Result Value Ref Range   Lactic Acid, Venous 1.1 0.5 - 1.9 mmol/L    Comment: Performed at [facility], [address], [place], [place] [address]  Procalcitonin     Status: None   Collection Time: 06/26/21  6:39 PM  Result Value Ref Range   Procalcitonin <0.10 ng/mL    Comment:        Interpretation: PCT (Procalcitonin) <= 0.5 ng/mL: Systemic infection (sepsis) is not likely. Local bacterial infection is possible. (NOTE)       Sepsis PCT Algorithm           Lower Respiratory Tract                                      Infection PCT Algorithm    ----------------------------     ----------------------------         PCT < 0.25 ng/mL                PCT < 0.10 ng/mL          Strongly encourage             Strongly discourage   discontinuation of antibiotics    initiation of antibiotics    ----------------------------     -----------------------------       PCT 0.25 - 0.50 ng/mL            PCT 0.10 - 0.25 ng/mL               OR       >80% decrease in PCT            Discourage initiation of                                            antibiotics      Encourage discontinuation           of antibiotics     ----------------------------     -----------------------------         PCT >= 0.50 ng/mL              PCT 0.26 - 0.50 ng/mL               AND        <80% decrease in PCT             Encourage initiation of                                             antibiotics       Encourage continuation           of antibiotics    ----------------------------     -----------------------------        PCT >= 0.50 ng/mL                  PCT > 0.50 ng/mL               AND         increase in PCT                  Strongly encourage  initiation of antibiotics    Strongly encourage escalation           of antibiotics                                     -----------------------------                                           PCT <= 0.25 ng/mL                                                 OR                                        > 80% decrease in PCT                                      Discontinue / Do not initiate                                             antibiotics  Performed at [facility], [address], [place], [place] [address]   Magnesium     Status: None   Collection Time: 06/26/21  6:39 PM  Result Value Ref Range   Magnesium 2.0 1.7 - 2.4 mg/dL    Comment: Performed at [facility], [address] [place], [place] [address]  Phosphorus     Status: None   Collection Time: 06/26/21  6:39 PM  Result Value Ref Range   Phosphorus 4.0 2.5 - 4.6 mg/dL    Comment: Performed at [facility], [address] [place], [place] [address]  CK     Status: Abnormal   Collection Time: 06/26/21  6:39 PM  Result Value Ref Range   Total CK 8 (L) 49 - 397 U/L    Comment: Performed at [facility], [address], [place], [place] [address]  Troponin I (High Sensitivity)     Status: Abnormal   Collection Time: 06/26/21  6:39 PM  Result Value Ref Range   Troponin I (High Sensitivity) 35 (H) <18 ng/L    Comment: (NOTE) Elevated high  sensitivity troponin I (hsTnI) values and significant  changes across serial measurements may suggest ACS but many other  chronic and acute conditions are known to elevate hsTnI results.  Refer to the "Links" section for chest pain algorithms and additional  guidance. Performed at [facility], [address], [place], [place] [address]   ABO/Rh     Status: None   Collection Time: 06/26/21  7:48 PM  Result Value Ref Range   ABO/RH(D)      O POS Performed at [facility], [address], [place], [place] [address]   CBC     Status: Abnormal   Collection Time: 06/26/21  7:48 PM  Result Value Ref Range   WBC 5.4 4.0 - 10.5 K/uL   RBC 2.50 (L) 4.22 - 5.81 MIL/uL   Hemoglobin 7.3 (L) 13.0 - 17.0 g/dL   HCT 40.9 (L) 81.1 - 91.4 %   MCV 86.8 80.0 - 100.0 fL   MCH 29.2 26.0 - 34.0 pg   MCHC 33.6 30.0 - 36.0 g/dL   RDW 78.2 (H) 95.6 - 21.3 %   Platelets 236 150 - 400 K/uL   nRBC 0.0 0.0 - 0.2 %    Comment: Performed at [facility], [address] [place], [place] [address]  Basic metabolic panel     Status: Abnormal   Collection Time: 06/26/21  7:48 PM  Result Value Ref Range   Sodium 137 135 - 145 mmol/L   Potassium 3.0 (L) 3.5 - 5.1 mmol/L   Chloride 112 (H) 98 - 111 mmol/L   CO2 20 (L) 22 - 32 mmol/L   Glucose, Bld 102 (H) 70 - 99 mg/dL    Comment: Glucose reference range applies only to samples taken after fasting for at least 8 hours.   BUN 42 (H) 8 - 23 mg/dL   Creatinine, Ser 8.46 (H) 0.61 - 1.24 mg/dL   Calcium 96.2 (H) 8.9 - 10.3 mg/dL   GFR, Estimated 23 (L) >60 mL/min    Comment: (NOTE) Calculated using the CKD-EPI Creatinine Equation (2021)    Anion gap 5 5 - 15    Comment: Performed at [facility], [address] [place], [place] [address]  Osmolality, urine     Status: Abnormal   Collection Time: 06/26/21  8:02 PM  Result Value Ref Range   Osmolality, Ur 246 (L) 300 - 900 mOsm/kg    Comment: Performed at [facility], [address], [place], [place] [address]  I-Stat venous blood gas, ED     Status: Abnormal   Collection Time: 06/26/21  8:28 PM  Result Value Ref Range   pH, Ven 7.290 7.250 - 7.430   pCO2, Ven 29.9 (L) 44.0 - 60.0 mmHg   pO2, Ven 102.0 (H) 32.0 - 45.0 mmHg   Bicarbonate 14.4 (L) 20.0 - 28.0 mmol/L   TCO2 15 (L) 22 - 32 mmol/L   O2 Saturation 97.0 %   Acid-base deficit 11.0 (H) 0.0 - 2.0 mmol/L   Sodium 140 135 - 145 mmol/L   Potassium 2.5 (LL) 3.5 - 5.1 mmol/L   Calcium, Ion 1.66 (HH) 1.15 - 1.40 mmol/L   HCT 19.0 (L) 39.0 - 52.0 %   Hemoglobin 6.5 (LL) 13.0 - 17.0 g/dL   Sample type VENOUS    Comment NOTIFIED PHYSICIAN   Ammonia     Status: None   Collection Time: 06/26/21  8:30 PM  Result Value Ref Range   Ammonia 21 9 - 35 umol/L    Comment: Performed at [facility], [address] [place], [place] [address]  Prepare RBC (crossmatch)     Status: None   Collection Time: 06/26/21  9:41 PM  Result Value Ref Range   Order Confirmation      ORDER PROCESSED BY BLOOD BANK Performed at [facility], [address], [place], [place] [address]   Prepare RBC (crossmatch)     Status: None   Collection Time: 06/27/21 12:28 AM  Result Value Ref Range   Order Confirmation      ORDER PROCESSED BY BLOOD BANK Performed at [facility], [address] [place], [place] [address]   CBG monitoring, ED     Status: None   Collection Time: 06/27/21 12:41 AM  Result Value Ref Range   Glucose-Capillary 94 70 - 99 mg/dL    Comment: Glucose reference range applies only to samples taken after fasting for at least 8 hours.  Troponin I (High Sensitivity)     Status: Abnormal   Collection Time: 06/27/21 12:46 AM  Result Value Ref Range   Troponin I (High Sensitivity) 25 (H) <18 ng/L    Comment: (NOTE) Elevated high sensitivity troponin I (hsTnI) values and significant  changes across serial measurements may suggest ACS but many other  chronic and acute conditions are known to elevate  hsTnI results.  Refer to the "Links" section for chest pain algorithms and additional  guidance. Performed at [facility], [address], [place], [place] [address]   Urinalysis, Routine w reflex microscopic Urine, Catheterized     Status: Abnormal   Collection Time: 06/27/21  1:06 AM  Result Value Ref Range   Color, Urine STRAW (A) YELLOW   APPearance CLEAR CLEAR   Specific Gravity, Urine 1.005 1.005 - 1.030   pH 7.0 5.0 - 8.0   Glucose, UA NEGATIVE NEGATIVE mg/dL   Hgb urine dipstick LARGE (A) NEGATIVE   Bilirubin Urine NEGATIVE NEGATIVE   Ketones, ur NEGATIVE NEGATIVE mg/dL   Protein, ur NEGATIVE NEGATIVE mg/dL   Nitrite NEGATIVE NEGATIVE   Leukocytes,Ua LARGE (A) NEGATIVE   RBC / HPF >50 (H) 0 - 5 RBC/hpf   WBC, UA 21-50 0 - 5 WBC/hpf   Bacteria, UA RARE (A) NONE SEEN   Squamous Epithelial / LPF 0-5 0 - 5   Mucus PRESENT     Comment: Performed at [facility], [address] [place], [place] [address]  Vitamin B12     Status: None   Collection Time: 06/27/21  4:41 AM  Result Value Ref Range   Vitamin B-12 351 180 - 914 pg/mL    Comment: (NOTE) This assay is not validated for testing neonatal or myeloproliferative syndrome specimens for Vitamin B12 levels. Performed at [facility], [address], [place], [place] [address]   Folate     Status: None   Collection Time: 06/27/21  4:41 AM  Result Value Ref Range   Folate 18.4 >5.9 ng/mL    Comment: Performed at [facility], [address], [place], [place] [address]  Iron and TIBC     Status: Abnormal   Collection Time: 06/27/21  4:41 AM  Result Value Ref Range   Iron 58 45 - 182 ug/dL   TIBC 324 (L) 401 - 027 ug/dL   Saturation Ratios 29 17.9 - 39.5 %   UIBC 139 ug/dL    Comment: Performed at [facility], [address], [place], [place] [address]  Ferritin     Status: None   Collection Time: 06/27/21  4:41 AM  Result Value Ref Range   Ferritin 79 24 - 336 ng/mL    Comment:  Performed at [facility], [address], [place], [place] [address]  Reticulocytes     Status: Abnormal   Collection Time: 06/27/21  4:41 AM  Result Value Ref Range   Retic Ct Pct 2.0 0.4 - 3.1 %   RBC. 3.67 (L) 4.22 - 5.81 MIL/uL   Retic Count, Absolute 72.3 19.0 - 186.0 K/uL   Immature Retic Fract 5.9 2.3 -  15.9 %    Comment: Performed at [facility], [address] [place], [place] [address]  CBC     Status: Abnormal   Collection Time: 06/27/21  4:41 AM  Result Value Ref Range   WBC 6.6 4.0 - 10.5 K/uL   RBC 3.67 (L) 4.22 - 5.81 MIL/uL   Hemoglobin 10.8 (L) 13.0 - 17.0 g/dL    Comment: REPEATED TO VERIFY POST TRANSFUSION SPECIMEN    HCT 31.7 (L) 39.0 - 52.0 %   MCV 86.4 80.0 - 100.0 fL   MCH 29.4 26.0 - 34.0 pg   MCHC 34.1 30.0 - 36.0 g/dL   RDW 60.4 (H) 54.0 - 98.1 %   Platelets 258 150 - 400 K/uL   nRBC 0.0 0.0 - 0.2 %    Comment: Performed at [facility], [address], [place], [place] [address]  Hemoglobin A1c     Status: None   Collection Time: 06/27/21  4:41 AM  Result Value Ref Range   Hgb A1c MFr Bld 5.5 4.8 - 5.6 %    Comment: (NOTE) Pre diabetes:          5.7%-6.4%  Diabetes:              >6.4%  Glycemic control for   <7.0% adults with diabetes    Mean Plasma Glucose 111.15 mg/dL    Comment: Performed at [facility], [address], [place], [place] [address]  TSH     Status: None   Collection Time: 06/27/21  4:41 AM  Result Value Ref Range   TSH 0.590 0.350 - 4.500 uIU/mL    Comment: Performed by a 3rd Generation assay with a functional sensitivity of <=0.01 uIU/mL. Performed at [facility], [address], [place], [place] [address]   Magnesium     Status: None   Collection Time: 06/27/21  9:25 AM  Result Value Ref Range   Magnesium 1.8 1.7 - 2.4 mg/dL    Comment: Performed at [facility], [address] [place], [place] [address]  Phosphorus     Status: None   Collection Time: 06/27/21  9:25 AM   Result Value Ref Range   Phosphorus 2.9 2.5 - 4.6 mg/dL    Comment: Performed at [facility], [address], [place], [place] [address]  CBC WITH DIFFERENTIAL     Status: Abnormal   Collection Time: 06/27/21  9:25 AM  Result Value Ref Range   WBC 6.8 4.0 - 10.5 K/uL   RBC 3.70 (L) 4.22 - 5.81 MIL/uL   Hemoglobin 11.1 (L) 13.0 - 17.0 g/dL   HCT 52.8 (L) 41.3 - 24.4 %   MCV 87.3 80.0 - 100.0 fL   MCH 30.0 26.0 - 34.0 pg   MCHC 34.4 30.0 - 36.0 g/dL   RDW 01.0 (H) 27.2 - 53.6 %   Platelets 284 150 - 400 K/uL   nRBC 0.0 0.0 - 0.2 %   Neutrophils Relative % 81 %   Neutro Abs 5.5 1.7 - 7.7 K/uL   Lymphocytes Relative 10 %   Lymphs Abs 0.7 0.7 - 4.0 K/uL   Monocytes Relative 8 %   Monocytes Absolute 0.5 0.1 - 1.0 K/uL   Eosinophils Relative 0 %   Eosinophils Absolute 0.0 0.0 - 0.5 K/uL   Basophils Relative 0 %   Basophils Absolute 0.0 0.0 - 0.1 K/uL   Immature Granulocytes 1 %   Abs Immature Granulocytes  0.04 0.00 - 0.07 K/uL    Comment: Performed at [facility], [address], [place], [place] [address]  Comprehensive metabolic panel     Status: Abnormal   Collection Time: 06/27/21  9:25 AM  Result Value Ref Range   Sodium 140 135 - 145 mmol/L   Potassium 3.0 (L) 3.5 - 5.1 mmol/L   Chloride 116 (H) 98 - 111 mmol/L   CO2 17 (L) 22 - 32 mmol/L   Glucose, Bld 119 (H) 70 - 99 mg/dL    Comment: Glucose reference range applies only to samples taken after fasting for at least 8 hours.   BUN 38 (H) 8 - 23 mg/dL   Creatinine, Ser 4.78 (H) 0.61 - 1.24 mg/dL   Calcium 29.5 (H) 8.9 - 10.3 mg/dL   Total Protein 5.1 (L) 6.5 - 8.1 g/dL   Albumin 2.4 (L) 3.5 - 5.0 g/dL   AST 17 15 - 41 U/L   ALT 14 0 - 44 U/L   Alkaline Phosphatase 65 38 - 126 U/L   Total Bilirubin 0.7 0.3 - 1.2 mg/dL   GFR, Estimated 24 (L) >60 mL/min    Comment: (NOTE) Calculated using the CKD-EPI Creatinine Equation (2021)    Anion gap 7 5 - 15    Comment: Performed at [facility], [address], [place], [place] [address]  VITAMIN D 25 Hydroxy (Vit-D Deficiency, Fractures)     Status: None   Collection Time: 06/27/21  9:25 AM  Result Value Ref Range   Vit D, 25-Hydroxy 63.90 30 - 100 ng/mL    Comment: (NOTE) Vitamin D deficiency has been defined by the Institute of Medicine  and an Endocrine Society practice guideline as a level of serum 25-OH  vitamin D less than 20 ng/mL (1,2). The Endocrine Society went on to  further define vitamin D insufficiency as a level between 21 and 29  ng/mL (2).  1. IOM (Institute of Medicine). 2010. Dietary reference intakes for  calcium and D. [place]: [employer]. 2. Holick MF, Binkley [place], Bischoff-Ferrari HA, et al. Evaluation,  treatment, and prevention of vitamin D deficiency: an Endocrine  Society clinical practice guideline, JCEM. 2011 Jul; 96(7): 1911-30.  Performed at [facility], [address], [place], [place] [address]    CT HEAD WO CONTRAST ( )  Result Date: 06/26/2021 CLINICAL DATA:  Altered mental status. EXAM: CT HEAD WITHOUT CONTRAST TECHNIQUE: Contiguous axial images were obtained from the base of the skull through the vertex without intravenous contrast. COMPARISON:  Head CT dated 01/24/2021. FINDINGS: Brain: Mild age-related atrophy and chronic microvascular ischemic changes. There is no acute intracranial hemorrhage. No mass effect or midline shift. Similar appearance of left frontotemporal low attenuating extra-axial collection, likely old hygroma. Vascular: No hyperdense vessel or unexpected calcification. Skull: Normal. Negative for fracture or focal lesion. Sinuses/Orbits: Mild diffuse mucoperiosteal thickening of paranasal sinuses. No air-fluid level. The mastoid air cells are clear. Other: None IMPRESSION: 1. No acute intracranial pathology. 2. Mild age-related atrophy and chronic microvascular ischemic changes. 3. Stable left frontotemporal low attenuating extra-axial collection, likely  old hygroma. Electronically Signed   By: Elgie Collard M.D.   On: 06/26/2021 23:25   DG Chest Port 1 View  Result Date: 06/26/2021 CLINICAL DATA:  Questionable sepsis - evaluate for abnormality More lethargic than normal. EXAM: PORTABLE CHEST 1 VIEW COMPARISON:  Radiograph 03/01/2021.  Chest CT 01/24/2021 FINDINGS: Patient had difficulty tolerating the exam, and patient's  hand partially obscures evaluation of the right upper chest. Similar volume loss to prior radiograph with right-sided pleural effusion. Slight increase fluid in the right minor fissure. Vague reticular opacities in the right perihilar lung. Unchanged heart size. Unchanged mediastinal contours with aortic atherosclerosis and tortuosity. No visualized left pleural effusion. No pneumothorax. IMPRESSION: Chronic unchanged right-sided pleural effusion with unchanged volume loss from prior radiograph. Vague reticular opacities in the right perihilar lung may be vascular or represent atypical infection. Pulmonary edema is felt less likely. Electronically Signed   By: Narda Rutherford M.D.   On: 06/26/2021 15:33   CT RENAL STONE STUDY  Result Date: 06/26/2021 CLINICAL DATA:  Acute renal failure EXAM: CT ABDOMEN AND PELVIS WITHOUT CONTRAST TECHNIQUE: Multidetector CT imaging of the abdomen and pelvis was performed following the standard protocol without IV contrast. COMPARISON:  None. FINDINGS: LOWER CHEST: Small right pleural effusion and right basilar atelectasis. HEPATOBILIARY: Normal hepatic contours. No intra- or extrahepatic biliary dilatation. Normal gallbladder. PANCREAS: Normal pancreas. No ductal dilatation or peripancreatic fluid collection. SPLEEN: Normal. ADRENALS/URINARY TRACT: The adrenal glands are normal. There are multiple large bilateral renal calculi, as previously demonstrated. On the right, stones extend into the renal pelvis and measure up to 4.7 cm. No hydronephrosis. The urinary bladder is normal for degree of  distention STOMACH/BOWEL: There is no hiatal hernia. Normal duodenal course and caliber. No small bowel dilatation or inflammation. Moderate amount of stool in the colon. Normal appendix. VASCULAR/LYMPHATIC: There is calcific atherosclerosis of the abdominal aorta. No lymphadenopathy. REPRODUCTIVE: There are calcifications within the normal-sized prostate. Symmetric seminal vesicles. MUSCULOSKELETAL. Chronic compression deformity at L1 OTHER: Small amount of right lower quadrant subcutaneous gas. IMPRESSION: 1. No acute abnormality of the abdomen or pelvis. 2. Multiple large bilateral renal calculi, as previously demonstrated. On the right, stones extend into the renal pelvis and measure up to 4.7 cm. No hydronephrosis. 3. Small right pleural effusion and right basilar atelectasis. Aortic Atherosclerosis (ICD10-I70.0). Electronically Signed   By: Deatra Robinson M.D.   On: 06/26/2021 23:45    Pending Labs Unresulted Labs (From admission, onward)     Start     Ordered   06/28/21 0500  PTH-related peptide  Tomorrow morning,   R        06/27/21 1219   06/28/21 0500  Comprehensive metabolic panel  Tomorrow morning,   R        06/27/21 1219   06/28/21 0500  CBC  Tomorrow morning,   R        06/27/21 1219   06/27/21 0704  Calcitriol (1,25 di-OH Vit D)  Once,   R        06/27/21 0703   06/27/21 0500  PTH, intact and calcium  Tomorrow morning,   R        06/27/21 0031            Vitals/Pain Today's Vitals   06/27/21 0915 06/27/21 1230 06/27/21 1300 06/27/21 1423  BP: 121/90 131/69 116/79 102/84  Pulse: 91 98 83 95  Resp: (!) 32 (!) 26 (!) 24 (!) 24  Temp:    97.7 F (36.5 C)  TempSrc:    Axillary  SpO2: 98% 96% 97% 97%  Weight:      Height:      PainSc:        Isolation Precautions Airborne and Contact precautions  Medications Medications  potassium chloride SA (KLOR-CON) CR tablet 40 mEq (40 mEq Oral Not Given 06/26/21 1709)  cefTRIAXone (ROCEPHIN) 2  g in sodium chloride 0.9 % 100  mL IVPB (has no administration in time range)  azithromycin (ZITHROMAX) 500 mg in sodium chloride 0.9 % 250 mL IVPB (has no administration in time range)  acetaminophen (TYLENOL) tablet 650 mg (650 mg Oral Given 06/26/21 2155)  0.9 %  sodium chloride infusion (0 mL/hr Intravenous Stopped 06/27/21 0916)  acetaminophen (TYLENOL) tablet 650 mg (has no administration in time range)    Or  acetaminophen (TYLENOL) suppository 650 mg (has no administration in time range)  ondansetron (ZOFRAN) tablet 4 mg (has no administration in time range)    Or  ondansetron (ZOFRAN) injection 4 mg (has no administration in time range)  tamsulosin (FLOMAX) capsule 0.4 mg (0.4 mg Oral Given 06/27/21 0917)  albuterol (VENTOLIN HFA) 108 (90 Base) MCG/ACT inhaler 1 puff (has no administration in time range)  pantoprazole (PROTONIX) injection 40 mg (40 mg Intravenous Given 06/27/21 0917)  calcitonin (MIACALCIN) injection 290 Units (290 Units Subcutaneous Given 06/27/21 1041)  0.9 %  sodium chloride infusion ( Intravenous New Bag/Given 06/27/21 0915)  lactated ringers bolus 2,500 mL (0 mLs Intravenous Stopped 06/26/21 1700)  cefTRIAXone (ROCEPHIN) 2 g in sodium chloride 0.9 % 100 mL IVPB (0 g Intravenous Stopped 06/26/21 1700)  azithromycin (ZITHROMAX) 500 mg in sodium chloride 0.9 % 250 mL IVPB (0 mg Intravenous Stopped 06/26/21 1722)  potassium chloride 10 mEq in 100 mL IVPB (0 mEq Intravenous Stopped 06/26/21 2137)  fentaNYL (SUBLIMAZE) injection 25 mcg (25 mcg Intravenous Given 06/26/21 2032)  0.9 %  sodium chloride infusion (Manually program via Guardrails IV Fluids) (0 mLs Intravenous Stopped 06/27/21 0152)  potassium chloride 10 mEq in 100 mL IVPB (0 mEq Intravenous Stopped 06/27/21 0031)  0.9 %  sodium chloride infusion (Manually program via Guardrails IV Fluids) ( Intravenous New Bag/Given 06/27/21 0139)    Mobility non-ambulatory     Focused Assessments Neuro Assessment Handoff:  Swallow screen pass? Yes           Neuro Assessment: Exceptions to WDL Neuro Checks:      Last Documented NIHSS Modified Score:   Has TPA been given? No If patient is a Neuro Trauma and patient is going to OR before floor call report to 4N Charge nurse: [phone] or [phone]   R Recommendations: See Admitting Provider Note  Report given to:   Additional Notes: Pt has a foley; Pt has on safety mittens

## 2021-06-28 DIAGNOSIS — I5032 Chronic diastolic (congestive) heart failure: Secondary | ICD-10-CM | POA: Diagnosis not present

## 2021-06-28 DIAGNOSIS — J189 Pneumonia, unspecified organism: Secondary | ICD-10-CM | POA: Diagnosis not present

## 2021-06-28 DIAGNOSIS — G9340 Encephalopathy, unspecified: Secondary | ICD-10-CM

## 2021-06-28 DIAGNOSIS — N390 Urinary tract infection, site not specified: Secondary | ICD-10-CM | POA: Diagnosis not present

## 2021-06-28 DIAGNOSIS — N179 Acute kidney failure, unspecified: Secondary | ICD-10-CM | POA: Diagnosis not present

## 2021-06-28 LAB — TYPE AND SCREEN
ABO/RH(D): O POS
Antibody Screen: NEGATIVE
Unit division: 0
Unit division: 0

## 2021-06-28 LAB — BPAM RBC
Blood Product Expiration Date: 202211212359
Blood Product Expiration Date: 202211282359
ISSUE DATE / TIME: 202210312326
ISSUE DATE / TIME: 202211010134
Unit Type and Rh: 5100
Unit Type and Rh: 5100

## 2021-06-28 LAB — COMPREHENSIVE METABOLIC PANEL
ALT: 12 U/L (ref 0–44)
AST: 22 U/L (ref 15–41)
Albumin: 2.4 g/dL — ABNORMAL LOW (ref 3.5–5.0)
Alkaline Phosphatase: 63 U/L (ref 38–126)
Anion gap: 8 (ref 5–15)
BUN: 37 mg/dL — ABNORMAL HIGH (ref 8–23)
CO2: 16 mmol/L — ABNORMAL LOW (ref 22–32)
Calcium: 10.5 mg/dL — ABNORMAL HIGH (ref 8.9–10.3)
Chloride: 118 mmol/L — ABNORMAL HIGH (ref 98–111)
Creatinine, Ser: 2.63 mg/dL — ABNORMAL HIGH (ref 0.61–1.24)
GFR, Estimated: 23 mL/min — ABNORMAL LOW (ref >60)
Glucose, Bld: 97 mg/dL (ref 70–99)
Potassium: 3.1 mmol/L — ABNORMAL LOW (ref 3.5–5.1)
Sodium: 142 mmol/L (ref 135–145)
Total Bilirubin: 0.5 mg/dL (ref 0.3–1.2)
Total Protein: 5.2 g/dL — ABNORMAL LOW (ref 6.5–8.1)

## 2021-06-28 LAB — CBC
HCT: 34.5 % — ABNORMAL LOW (ref 39.0–52.0)
Hemoglobin: 11.8 g/dL — ABNORMAL LOW (ref 13.0–17.0)
MCH: 29.9 pg (ref 26.0–34.0)
MCHC: 34.2 g/dL (ref 30.0–36.0)
MCV: 87.3 fL (ref 80.0–100.0)
Platelets: 287 10*3/uL (ref 150–400)
RBC: 3.95 MIL/uL — ABNORMAL LOW (ref 4.22–5.81)
RDW: 16.1 % — ABNORMAL HIGH (ref 11.5–15.5)
WBC: 9.6 10*3/uL (ref 4.0–10.5)
nRBC: 0 % (ref 0.0–0.2)

## 2021-06-28 LAB — CALCITRIOL (1,25 DI-OH VIT D): Vit D, 1,25-Dihydroxy: 95.6 pg/mL — ABNORMAL HIGH (ref 24.8–81.5)

## 2021-06-28 MED ORDER — APIXABAN 2.5 MG PO TABS
2.5000 mg | ORAL_TABLET | Freq: Two times a day (BID) | ORAL | Status: DC
  Administered 2021-06-28 – 2021-07-04 (×12): 2.5 mg via ORAL
  Filled 2021-06-28 (×13): qty 1

## 2021-06-28 MED ORDER — POTASSIUM CHLORIDE CRYS ER 20 MEQ PO TBCR
40.0000 meq | EXTENDED_RELEASE_TABLET | ORAL | Status: AC
  Administered 2021-06-28 (×2): 40 meq via ORAL
  Filled 2021-06-28 (×2): qty 2

## 2021-06-28 MED ORDER — ENSURE ENLIVE PO LIQD
237.0000 mL | Freq: Three times a day (TID) | ORAL | Status: DC
  Administered 2021-06-28 – 2021-07-04 (×8): 237 mL via ORAL

## 2021-06-28 MED ORDER — ADULT MULTIVITAMIN W/MINERALS CH
1.0000 | ORAL_TABLET | Freq: Every day | ORAL | Status: DC
  Administered 2021-06-28 – 2021-07-04 (×7): 1 via ORAL
  Filled 2021-06-28 (×7): qty 1

## 2021-06-28 MED ORDER — CHLORHEXIDINE GLUCONATE CLOTH 2 % EX PADS
6.0000 | MEDICATED_PAD | Freq: Every day | CUTANEOUS | Status: DC
  Administered 2021-06-28 – 2021-07-04 (×5): 6 via TOPICAL

## 2021-06-28 NOTE — Progress Notes (Signed)
Brief GI Update Note: Hb has continued to increase without any additional interventions. No need for any GI evaluation at this time. GI will sign off.

## 2021-06-28 NOTE — TOC Initial Note (Signed)
Transition of Care [facility]) - Initial/Assessment Note    Patient Details  Name: Alfred Mercado MRN: [id number] Date of Birth: 04/01/1936  [facility] CM/SW Contact:    Benard Halsted, LCSW Phone Number: 06/28/2021, 4:07 PM  Clinical Narrative:                 Patient resides under long term care at [facility]. CSW familiar with this patient from a past admission. CSW left voicemail for Lorriane Shire with DSS department of aging to make sure that patient does not have a legal guardian through [place].   Expected Discharge Plan: Skilled Nursing Facility Barriers to Discharge: Continued Medical Work up   Patient Goals and CMS Choice Patient states their goals for this hospitalization and ongoing recovery are:: Return to snf CMS Medicare.gov Compare Post Acute Care list provided to:: Patient Represenative (must comment)    Expected Discharge Plan and Services Expected Discharge Plan: [place] In-house Referral: Clinical Social Work   Post Acute Care Choice: [place] Living arrangements for the past 2 months: [place]                                      Prior Living Arrangements/Services Living arrangements for the past 2 months: [place] Lives with:: Facility Resident Patient language and need for interpreter reviewed:: Yes Do you feel safe going back to the place where you live?: Yes      Need for Family Participation in Patient Care: Yes (Comment) Care giver support system in place?: Yes (comment)   Criminal Activity/Legal Involvement Pertinent to Current Situation/Hospitalization: No - Comment as needed  Activities of Daily Living   ADL Screening (condition at time of admission) Patient's cognitive ability adequate to safely complete daily activities?: No Is the patient deaf or have difficulty hearing?: No Does the patient have difficulty seeing, even when wearing glasses/contacts?: No Does the  patient have difficulty concentrating, remembering, or making decisions?: Yes Patient able to express need for assistance with ADLs?: Yes Does the patient have difficulty dressing or bathing?: Yes Independently performs ADLs?: No  Permission Sought/Granted Permission sought to share information with : Facility Sport and exercise psychologist, Family Supports Permission granted to share information with : Yes, Verbal Permission Granted  Share Information with NAME: Juanda Crumble  Permission granted to share info w AGENCY: [facility]  Permission granted to share info w Relationship: Doristine Bosworth  Permission granted to share info w Contact Information: [phone]  Emotional Assessment   Attitude/Demeanor/Rapport: Unable to Assess Affect (typically observed): Unable to Assess Orientation: : Oriented to Self Alcohol / Substance Use: Not Applicable Psych Involvement: No (comment)  Admission diagnosis:  Hypokalemia [E87.6] Delirium [R41.0] CAP (community acquired pneumonia) [J18.9] AKI (acute kidney injury) ([place]) [N17.9] Sepsis, due to unspecified organism, unspecified whether acute organ dysfunction present [facility]) [A41.9] Patient Active Problem List   Diagnosis Date Noted   Delirium    CAP (community acquired pneumonia) 06/26/2021   COVID-19 virus infection 06/26/2021   Occult blood positive stool 06/26/2021   Hypercalcemia 06/26/2021   Elevated troponin 06/26/2021   Malnutrition of moderate degree 03/14/2021   Patient incapable of making informed decisions    Somnolence    Sepsis ([place]) 03/02/2021   Cellulitis 03/02/2021   Hyponatremia 03/02/2021   Renal failure    Hypotension 02/09/2021   Falls 02/09/2021   Syncope and collapse 02/09/2021   Severe sepsis ([place]) 01/25/2021  Iron deficiency anemia 01/25/2021   Generalized weakness    Acute-on-chronic kidney injury (HCC) 01/24/2021   Complicated UTI (urinary tract infection) 01/24/2021   Renal mass, left 01/24/2021   Staghorn renal calculus  01/24/2021   Microscopic hematuria 01/24/2021   Aortic aneurysm (HCC) 01/24/2021   Closed burst fracture of lumbar vertebra (HCC) 01/24/2021   Acute respiratory failure with hypoxia (HCC) 08/13/2020   UTI (urinary tract infection) 06/02/2020   Community acquired pneumonia of right lower lobe of lung 06/02/2020   Acute hypoxemic respiratory failure (HCC) 06/02/2020   Hypokalemia 06/02/2020   Acute diastolic CHF (congestive heart failure) (HCC) 02/06/2020   Exertional shortness of breath 10/14/2019   Atrial fibrillation with RVR (HCC) 10/14/2019   Pleural effusion    Chronic heart failure with preserved ejection fraction (HCC)    PCP:  Karna Dupes, MD Pharmacy:   [facility] [phone] - [place], [place] - [phone] AT [facility] [address] [place] [address] Phone: [phone] Fax: [phone]  [facility] [address] [place] [address] Phone: [phone] Fax: [phone]     Social Determinants of Health (SDOH) Interventions    Readmission Risk Interventions Readmission Risk Prevention Plan 06/28/2021 03/17/2021  Transportation Screening Complete Complete  Medication Review (RN Care Manager) Complete Referral to Pharmacy  PCP or Specialist appointment within 3-5 days of discharge Complete Complete  HRI or Home Care Consult Complete Complete  SW Recovery Care/Counseling Consult Complete Complete  Palliative Care Screening Complete Not Applicable  Skilled Nursing Facility Complete Complete  Some recent data might be hidden

## 2021-06-28 NOTE — Progress Notes (Signed)
Palliative:  Consult received. Chart reviewed. Visited patient - unable to participate in goals of care. Only asks me for water. Received update from RN - less agitated today. PO intake remains poor.  Called patient's pastor/friend listed in contacts - no answer. Voicemail with call back number left.  Also called SNF to inquire about other contacts - they tell me they have no contact listed for patient.   Will continue attempts to get in touch with pastor/friend to discuss GOC.   Gerlean Ren, DNP, AGNP-C Palliative Medicine Team Team Phone # [phone]  Pager # [phone]   NO CHARGE

## 2021-06-28 NOTE — Progress Notes (Signed)
PROGRESS NOTE        PATIENT DETAILS Name: Alfred Mercado Age: 85 y.o. Sex: male Date of Birth: [birth year] Admit Date: 06/26/2021 Admitting Physician Toy Baker, MD [id number], Rozanna Boer, MD  Brief Narrative: Patient is a 85 y.o. male with history of dementia, PAF on Eliquis, staghorn calculi-who was recently diagnosed with pneumonia at his nursing facility presented with altered mental status-upon further evaluation he was found to have hypercalcemia, AKI.  See below for further details.  Subjective: Lying comfortably in bed-no chest pain or shortness of breath.  Able to follow simple commands-but still gets agitated and pushes me away.  Objective: Vitals: Blood pressure 114/77, pulse (!) 103, temperature 98.5 F (36.9 C), temperature source Axillary, resp. rate 18, height 6\' 1"  (1.854 m), weight 72.6 kg, SpO2 98 %.   Exam: Gen Exam:not in any distress HEENT:atraumatic, normocephalic Chest: B/L clear to auscultation anteriorly CVS:S1S2 regular Abdomen:soft non tender, non distended Extremities:no edema Neurology: Difficult exam but seems to be nonfocal. Skin: no rash   Pertinent Labs/Radiology: Recent Labs  Lab 06/26/21 1426 06/26/21 1948 06/27/21 0925 06/28/21 0610 06/28/21 0725  WBC 7.5   < > 6.8  --  9.6  HGB 9.6*   < > 11.1*  --  11.8*  PLT 297   < > 284  --  287  NA 136   < > 140 142  --   K 2.7*   < > 3.0* 3.1*  --   CREATININE 3.11*   < > 2.61* 2.63*  --   AST 18  --  17 22  --   ALT 16  --  14 12  --   ALKPHOS 69  --  65 63  --   BILITOT 0.3  --  0.7 0.5  --    < > = values in this interval not displayed.     10/31>>Blood culture: No growth 10/31>> urine culture: No growth  10/31>>CXR: Chronic unchanged right-sided pleural effusion. 10/31>> CT head: No acute intracranial pathology, stable left frontotemporal hygroma. 10/31>> CT renal stone study: No acute abnormality, multiple large bilateral renal calculi as  previously demonstrated.  Assessment/Plan: Acute metabolic encephalopathy: Due to AKI/hypercalcemia-apparently has dementia at baseline.  Mental status slowly improving-continue to correct underlying electrolyte abnormalities.  CT head negative for acute abnormalities.   AKI on CKD stage IIIb: AKI multifactorial-hemodynamically mediated and from hypercalcemia.  UA without proteinuria, renal CT without hydronephrosis.continuing overall improved but seems to have plateaued compared to yesterday.  Hopefully his calcium continues to improve-renal function will improve further.    Hypercalcemia: Unclear etiology-improving with IV fluid hydration and calcitonin.  Await PTH, PTH RP and vitamin D3 levels.    ?  Aspiration pneumonia: Do not think patient had sepsis physiology on admission-chest x-ray does not look that different than usual baseline (has chronic right-sided pleural effusion).  Continue antibiotics for a few more days.  Asymptomatic bacteriuria vs complicated UTI: UA grossly abnormal-unable to obtain history to see if he has symptoms consistent with UTI (demented/agitated).  Continue IV antibiotics for a few more days-urine culture negative.  Apparently has a Foley in place that was placed in the ED-we will attempt a voiding trial today.    COVID-19 infection: Per ED notes and H&P-diagnosed 2 weeks ago-does not appear to have any active infection at this point.  No need for  isolation.  Normocytic anemia: Suspect has a combination of iron deficiency and anemia of chronic disease at baseline-worsened likely due to IV fluid hydration-required 2 units of PRBC transfusion.  No overt GI bleeding apparent even though FOBT positive.  Resume Eliquis.  Appreciate GI input.    Chronic atrial fibrillation: We will resume Eliquis today.  ?Dementia/cognitive dysfunction at baseline: Confused-expect worsening delirium during this hospital stay.  Goals of care: Frail-does not appear to be a great  candidate for aggressive care-briefly discussed over the phone with his pastor-Charles Greer Pickerel (point of contact officiate).  Understands long-term prognosis is poor-we will place a palliative care consult for continued goals of care discussion.  We will continue to engage with family-we will continue full code for now.  Procedures: None Consults: GI DVT Prophylaxis:  Code Status:Full code  Family Communication: Sherri Sear (contact person/pastor) [phone] updated over the phone on 11/1  Time spent: 35 minutes-Greater than 50% of this time was spent in counseling, explanation of diagnosis, planning of further management, and coordination of care.   Disposition Plan: Status is: Inpatient  Remains inpatient appropriate because: Altered mental status not yet at baseline-hypercalcemia/AKI-on IV fluids/calcitonin injections.  Not stable for discharge.   Diet: Diet Order             DIET DYS 2 Room service appropriate? Yes; Fluid consistency: Thin  Diet effective now                     Antimicrobial agents: Anti-infectives (From admission, onward)    Start     Dose/Rate Route Frequency Ordered Stop   06/27/21 1800  azithromycin (ZITHROMAX) 500 mg in sodium chloride 0.9 % 250 mL IVPB        500 mg 250 mL/hr over 60 Minutes Intravenous Every 24 hours 06/26/21 1952 07/02/21 1759   06/27/21 1600  cefTRIAXone (ROCEPHIN) 2 g in sodium chloride 0.9 % 100 mL IVPB        2 g 200 mL/hr over 30 Minutes Intravenous Every 24 hours 06/26/21 1952 07/02/21 1559   06/26/21 1430  cefTRIAXone (ROCEPHIN) 2 g in sodium chloride 0.9 % 100 mL IVPB        2 g 200 mL/hr over 30 Minutes Intravenous  Once 06/26/21 1427 06/26/21 1700   06/26/21 1430  azithromycin (ZITHROMAX) 500 mg in sodium chloride 0.9 % 250 mL IVPB        500 mg 250 mL/hr over 60 Minutes Intravenous  Once 06/26/21 1427 06/26/21 1722        MEDICATIONS: Scheduled Meds:  acetaminophen  650 mg Oral QHS   apixaban   2.5 mg Oral BID   Chlorhexidine Gluconate Cloth  6 each Topical Daily   pantoprazole (PROTONIX) IV  40 mg Intravenous Q12H   potassium chloride  40 mEq Oral Q4H   tamsulosin  0.4 mg Oral Daily   Continuous Infusions:  sodium chloride 125 mL/hr at 06/28/21 0806   azithromycin 500 mg (06/27/21 1817)   cefTRIAXone (ROCEPHIN)  IV 2 g (06/27/21 1645)   PRN Meds:.acetaminophen **OR** acetaminophen, albuterol, ondansetron **OR** ondansetron (ZOFRAN) IV   I have personally reviewed following labs and imaging studies  LABORATORY DATA: CBC: Recent Labs  Lab 06/26/21 1426 06/26/21 1948 06/26/21 2028 06/27/21 0441 06/27/21 0925 06/28/21 0725  WBC 7.5 5.4  --  6.6 6.8 9.6  NEUTROABS 5.7  --   --   --  5.5  --   HGB 9.6* 7.3* 6.5* 10.8* 11.1* 11.8*  HCT 28.5* 21.7* 19.0* 31.7* 32.3* 34.5*  MCV 86.4 86.8  --  86.4 87.3 87.3  PLT 297 236  --  258 284 287     Basic Metabolic Panel: Recent Labs  Lab 06/26/21 1426 06/26/21 1839 06/26/21 1948 06/26/21 2028 06/27/21 0925 06/28/21 0610  NA 136  --  137 140 140 142  K 2.7*  --  3.0* 2.5* 3.0* 3.1*  CL 110  --  112*  --  116* 118*  CO2 17*  --  20*  --  17* 16*  GLUCOSE 156*  --  102*  --  119* 97  BUN 47*  --  42*  --  38* 37*  CREATININE 3.11*  --  2.69*  --  2.61* 2.63*  CALCIUM 12.7*  --  12.3*  --  10.9* 10.5*  MG  --  2.0  --   --  1.8  --   PHOS  --  4.0  --   --  2.9  --      GFR: Estimated Creatinine Clearance: 21.9 mL/min (A) (by C-G formula based on SCr of 2.63 mg/dL (H)).  Liver Function Tests: Recent Labs  Lab 06/26/21 1426 06/27/21 0925 06/28/21 0610  AST 18 17 22   ALT 16 14 12   ALKPHOS 69 65 63  BILITOT 0.3 0.7 0.5  PROT 5.5* 5.1* 5.2*  ALBUMIN 2.6* 2.4* 2.4*    No results for input(s): LIPASE, AMYLASE in the last 168 hours. Recent Labs  Lab 06/26/21 2030  AMMONIA 21     Coagulation Profile: Recent Labs  Lab 06/26/21 1426  INR 1.5*     Cardiac Enzymes: Recent Labs  Lab 06/26/21 1839   CKTOTAL 8*     BNP (last 3 results) No results for input(s): PROBNP in the last 8760 hours.  Lipid Profile: No results for input(s): CHOL, HDL, LDLCALC, TRIG, CHOLHDL, LDLDIRECT in the last 72 hours.  Thyroid Function Tests: Recent Labs    06/27/21 0441  TSH 0.590     Anemia Panel: Recent Labs    06/27/21 0441  VITAMINB12 351  FOLATE 18.4  FERRITIN 79  TIBC 197*  IRON 58  RETICCTPCT 2.0     Urine analysis:    Component Value Date/Time   COLORURINE STRAW (A) 06/27/2021 0106   APPEARANCEUR CLEAR 06/27/2021 0106   LABSPEC 1.005 06/27/2021 0106   PHURINE 7.0 06/27/2021 0106   GLUCOSEU NEGATIVE 06/27/2021 0106   HGBUR LARGE (A) 06/27/2021 0106   BILIRUBINUR NEGATIVE 06/27/2021 0106   BILIRUBINUR negative 06/27/2020 1140   KETONESUR NEGATIVE 06/27/2021 0106   PROTEINUR NEGATIVE 06/27/2021 0106   UROBILINOGEN 1.0 06/27/2020 1140   NITRITE NEGATIVE 06/27/2021 0106   LEUKOCYTESUR LARGE (A) 06/27/2021 0106    Sepsis Labs: Lactic Acid, Venous    Component Value Date/Time   LATICACIDVEN 1.1 06/26/2021 1815    MICROBIOLOGY: Recent Results (from the past 240 hour(s))  Urine Culture     Status: None   Collection Time: 06/26/21  2:09 PM   Specimen: In/Out Cath Urine  Result Value Ref Range Status   Specimen Description IN/OUT CATH URINE  Final   Special Requests NONE  Final   Culture   Final    NO GROWTH Performed at [facility], [place] [address] [place], [place] [address]    Report Status 06/27/2021 FINAL  Final  Blood Culture (routine x 2)     Status: None (Preliminary result)   Collection Time: 06/26/21  2:26 PM  Specimen: BLOOD LEFT FOREARM  Result Value Ref Range Status   Specimen Description BLOOD LEFT FOREARM  Final   Special Requests   Final    BOTTLES DRAWN AEROBIC AND ANAEROBIC Blood Culture results may not be optimal due to an excessive volume of blood received in culture bottles   Culture   Final    NO GROWTH 2 DAYS Performed at [facility], [address] [place], [place] [address]    Report Status PENDING  Incomplete  Blood Culture (routine x 2)     Status: None (Preliminary result)   Collection Time: 06/26/21  2:31 PM   Specimen: BLOOD RIGHT FOREARM  Result Value Ref Range Status   Specimen Description BLOOD RIGHT FOREARM  Final   Special Requests   Final    BOTTLES DRAWN AEROBIC AND ANAEROBIC Blood Culture adequate volume   Culture   Final    NO GROWTH 2 DAYS Performed at [facility], [address] [place], [place] [address]    Report Status PENDING  Incomplete  Resp Panel by RT-PCR (Flu A&B, Covid) Nasopharyngeal Swab     Status: Abnormal   Collection Time: 06/26/21  3:06 PM   Specimen: Nasopharyngeal Swab; Nasopharyngeal(NP) swabs in vial transport medium  Result Value Ref Range Status   SARS Coronavirus 2 by RT PCR POSITIVE (A) NEGATIVE Final    Comment: RESULT CALLED TO, READ BACK BY AND VERIFIED WITH: Merlinda FrederickJ NEWTON RN [id number] 06/26/21 A BROWNING (NOTE) SARS-CoV-2 target nucleic acids are DETECTED.  The SARS-CoV-2 RNA is generally detectable in upper respiratory specimens during the acute phase of infection. Positive results are indicative of the presence of the identified virus, but do not rule out bacterial infection or co-infection with other pathogens not detected by the test. Clinical correlation with patient history and other diagnostic information is necessary to determine patient infection status. The expected result is Negative.  Fact Sheet for Patients: BloggerCourse.comhttps://www.fda.gov/media/152166/download  Fact Sheet for Healthcare Providers: SeriousBroker.ithttps://www.fda.gov/media/152162/download  This test is not yet approved or cleared by the [place] FDA and  has been authorized for detection and/or diagnosis of SARS-CoV-2 by FDA under an Emergency Use Authorization (EUA).  This EUA will remain in effect (meaning this test can  be used) for the duration of  the COVID-19 declaration under  Section 564(b)(1) of the Act, 21 U.S.C. section 360bbb-3(b)(1), unless the authorization is terminated or revoked sooner.     Influenza A by PCR NEGATIVE NEGATIVE Final   Influenza B by PCR NEGATIVE NEGATIVE Final    Comment: (NOTE) The Xpert Xpress SARS-CoV-2/FLU/RSV plus assay is intended as an aid in the diagnosis of influenza from Nasopharyngeal swab specimens and should not be used as a sole basis for treatment. Nasal washings and aspirates are unacceptable for Xpert Xpress SARS-CoV-2/FLU/RSV testing.  Fact Sheet for Patients: BloggerCourse.comhttps://www.fda.gov/media/152166/download  Fact Sheet for Healthcare Providers: SeriousBroker.ithttps://www.fda.gov/media/152162/download  This test is not yet approved or cleared by the [place] FDA and has been authorized for detection and/or diagnosis of SARS-CoV-2 by FDA under an Emergency Use Authorization (EUA). This EUA will remain in effect (meaning this test can be used) for the duration of the COVID-19 declaration under Section 564(b)(1) of the Act, 21 U.S.C. section 360bbb-3(b)(1), unless the authorization is terminated or revoked.  Performed at [facility], [address] [place], [place] [address]     RADIOLOGY STUDIES/RESULTS: CT HEAD WO CONTRAST (5MM)  Result Date: 06/26/2021 CLINICAL DATA:  Altered mental status. EXAM:  CT HEAD WITHOUT CONTRAST TECHNIQUE: Contiguous axial images were obtained from the base of the skull through the vertex without intravenous contrast. COMPARISON:  Head CT dated 01/24/2021. FINDINGS: Brain: Mild age-related atrophy and chronic microvascular ischemic changes. There is no acute intracranial hemorrhage. No mass effect or midline shift. Similar appearance of left frontotemporal low attenuating extra-axial collection, likely old hygroma. Vascular: No hyperdense vessel or unexpected calcification. Skull: Normal. Negative for fracture or focal lesion. Sinuses/Orbits: Mild diffuse mucoperiosteal thickening of  paranasal sinuses. No air-fluid level. The mastoid air cells are clear. Other: None IMPRESSION: 1. No acute intracranial pathology. 2. Mild age-related atrophy and chronic microvascular ischemic changes. 3. Stable left frontotemporal low attenuating extra-axial collection, likely old hygroma. Electronically Signed   By: Elgie Collard M.D.   On: 06/26/2021 23:25   DG Chest Port 1 View  Result Date: 06/26/2021 CLINICAL DATA:  Questionable sepsis - evaluate for abnormality More lethargic than normal. EXAM: PORTABLE CHEST 1 VIEW COMPARISON:  Radiograph 03/01/2021.  Chest CT 01/24/2021 FINDINGS: Patient had difficulty tolerating the exam, and patient's hand partially obscures evaluation of the right upper chest. Similar volume loss to prior radiograph with right-sided pleural effusion. Slight increase fluid in the right minor fissure. Vague reticular opacities in the right perihilar lung. Unchanged heart size. Unchanged mediastinal contours with aortic atherosclerosis and tortuosity. No visualized left pleural effusion. No pneumothorax. IMPRESSION: Chronic unchanged right-sided pleural effusion with unchanged volume loss from prior radiograph. Vague reticular opacities in the right perihilar lung may be vascular or represent atypical infection. Pulmonary edema is felt less likely. Electronically Signed   By: Narda Rutherford M.D.   On: 06/26/2021 15:33   CT RENAL STONE STUDY  Result Date: 06/26/2021 CLINICAL DATA:  Acute renal failure EXAM: CT ABDOMEN AND PELVIS WITHOUT CONTRAST TECHNIQUE: Multidetector CT imaging of the abdomen and pelvis was performed following the standard protocol without IV contrast. COMPARISON:  None. FINDINGS: LOWER CHEST: Small right pleural effusion and right basilar atelectasis. HEPATOBILIARY: Normal hepatic contours. No intra- or extrahepatic biliary dilatation. Normal gallbladder. PANCREAS: Normal pancreas. No ductal dilatation or peripancreatic fluid collection. SPLEEN: Normal.  ADRENALS/URINARY TRACT: The adrenal glands are normal. There are multiple large bilateral renal calculi, as previously demonstrated. On the right, stones extend into the renal pelvis and measure up to 4.7 cm. No hydronephrosis. The urinary bladder is normal for degree of distention STOMACH/BOWEL: There is no hiatal hernia. Normal duodenal course and caliber. No small bowel dilatation or inflammation. Moderate amount of stool in the colon. Normal appendix. VASCULAR/LYMPHATIC: There is calcific atherosclerosis of the abdominal aorta. No lymphadenopathy. REPRODUCTIVE: There are calcifications within the normal-sized prostate. Symmetric seminal vesicles. MUSCULOSKELETAL. Chronic compression deformity at L1 OTHER: Small amount of right lower quadrant subcutaneous gas. IMPRESSION: 1. No acute abnormality of the abdomen or pelvis. 2. Multiple large bilateral renal calculi, as previously demonstrated. On the right, stones extend into the renal pelvis and measure up to 4.7 cm. No hydronephrosis. 3. Small right pleural effusion and right basilar atelectasis. Aortic Atherosclerosis (ICD10-I70.0). Electronically Signed   By: Deatra Robinson M.D.   On: 06/26/2021 23:45     LOS: 2 days   Jeoffrey Massed, MD  [facility]    To contact the attending provider between 7A-7P or the covering provider during after hours 7P-7A, please log into the web site [email or web address] and access using universal [place] password for that web site. If you do not have the password, please call the hospital operator.  06/28/2021, 1:40 PM

## 2021-06-28 NOTE — Progress Notes (Signed)
OT Cancellation Note  Patient Details Name: JAMESYN LINDELL MRN: [id number] DOB: [birth year]   Cancelled Treatment:    Reason Eval/Treat Not Completed: Patient at procedure or test/ unavailable. Pt having IV line placement.  Alphia Moh OTR/L  Acute Rehab Services  [phone] office number [phone] pager number   Alphia Moh 06/28/2021, 8:36 AM

## 2021-06-28 NOTE — Evaluation (Signed)
Physical Therapy Evaluation Patient Details Name: STANFORD STRAUCH MRN: [id number] DOB: [birth year] Today's Date: 06/28/2021  History of Present Illness  Pt is a 85 yr old male who presented to [facility] hospital on 06/26/2021 due to acute encephalopathy. Of note pt also with recent diagnosis of COVID-19 and PNA. PMH of sepsis, diastolic CHF, afib, dementia, COVID, CKD.  Clinical Impression  Pt presents to PT with deficits in functional mobility, balance, power, ROM, strength, cognition, vision. It is difficult to determine the patient's recent baseline due to altered mental status and no family or caregivers being present during evaluation. Prior to admission in June the patient was ambulating some in his home and was living alone. Pt appears to have been at a SNF since this time. Pt is currently very anxious with all mobility and appears to have significant vision deficits. Pt is also very hard of hearing, likely leading to increased anxiety with mobility. Pt requires significant physical assistance to perform bed mobility, although demonstrating some good UE strength during session. Pt will benefit from continued attempts at mobilization to aide in reducing caregiver burden and falls risk.       Recommendations for follow up therapy are one component of a multi-disciplinary discharge planning process, led by the attending physician.  Recommendations may be updated based on patient status, additional functional criteria and insurance authorization.  Follow Up Recommendations Skilled nursing-short term rehab (<3 hours/day)    Assistance Recommended at Discharge Frequent or constant Supervision/Assistance  Functional Status Assessment Patient has had a recent decline in their functional status and demonstrates the ability to make significant improvements in function in a reasonable and predictable amount of time. (difficult to assess as no family or caregivers present and pt demonstrates significantly  altered mental status)  Equipment Recommendations   (defer to post-acute)    Recommendations for Other Services       Precautions / Restrictions Precautions Precautions: Fall Precaution Comments: monitor HR, tachy, afib Restrictions Weight Bearing Restrictions: No      Mobility  Bed Mobility Overal bed mobility: Needs Assistance Bed Mobility: Rolling;Supine to Sit;Sit to Supine Rolling: Total assist;+2 for physical assistance   Supine to sit: Total assist;+2 for physical assistance;HOB elevated Sit to supine: Total assist;+2 for physical assistance        Transfers                        Ambulation/Gait                Stairs            Wheelchair Mobility    Modified Rankin (Stroke Patients Only)       Balance Overall balance assessment: Needs assistance Sitting-balance support: Single extremity supported;Feet unsupported Sitting balance-[name]: Zero Sitting balance - Comments: totalA x2, pt reports stomach discomfort in sitting, actively attempting to return to supine immediately upon achieving seated position Postural control: Posterior lean                                   Pertinent Vitals/Pain Pain Assessment: Faces Faces Pain Scale: Hurts whole lot Pain Location: stomach Pain Descriptors / Indicators: Moaning Pain Intervention(s): Monitored during session    Home Living Family/patient expects to be discharged to:: Skilled nursing facility                   Additional Comments: pt is unable  to provide history. Chart indicates pt was admitted from [facility]    Prior Function Prior Level of Function : Patient poor historian/Family not available             Mobility Comments: pt had been able to ambulate prior to admission in June 2022, otherwise no other information in chart on mobility noted       Hand Dominance   Dominant Hand: Right    Extremity/Trunk Assessment   Upper Extremity  Assessment Upper Extremity Assessment: Defer to OT evaluation    Lower Extremity Assessment Lower Extremity Assessment: RLE deficits/detail;LLE deficits/detail RLE Deficits / Details: pt appears to have preference for knee and hip flexion at this time, PT notes significant stiffness in BLE, unable to fully assess ROM due to some resistance noted from patient. PT does not note full extension of knees or hips during session, with max extension of knees estimated to be at 20 degrees flexion LLE Deficits / Details: pt appears to have preference for knee and hip flexion at this time, PT notes significant stiffness in BLE, unable to fully assess ROM due to some resistance noted from patient. PT does not note full extension of knees or hips during session, with max extension of knees estimated to be at 20 degrees flexion    Cervical / Trunk Assessment Cervical / Trunk Assessment: Kyphotic  Communication   Communication: HOH  Cognition Arousal/Alertness: Awake/alert Behavior During Therapy: Anxious;Restless Overall Cognitive Status: History of cognitive impairments - at baseline                                 General Comments: pt with history of dementia, does not follow commands. Pt demonstrates difficulty tracking with eyes, moving eyes toward direction of loud verbal stimulus and showing intermittent tracking to PT hand. Pt is very anxious and does not tolerate mobility well this session.        General Comments General comments (skin integrity, edema, etc.): pt tachy from 108-154 during session, monitor noting A-fib during session. Pt is very anxious during session when mobilized, does calm down when left to rest.    Exercises     Assessment/Plan    PT Assessment Patient needs continued PT services  PT Problem List Decreased strength;Decreased activity tolerance;Decreased range of motion;Decreased balance;Decreased mobility;Decreased cognition;Decreased knowledge of use of  DME;Decreased safety awareness;Decreased knowledge of precautions;Cardiopulmonary status limiting activity;Impaired tone;Pain       PT Treatment Interventions DME instruction;Therapeutic activities;Functional mobility training;Therapeutic exercise;Balance training;Neuromuscular re-education;Cognitive remediation;Patient/family education;Wheelchair mobility training    PT Goals (Current goals can be found in the Care Plan section)  Acute Rehab PT Goals Patient Stated Goal: pt is unable to state. PT goal to improving bed mobility and sitting balance quality PT Goal Formulation: Patient unable to participate in goal setting Time For Goal Achievement: 07/12/21 Potential to Achieve Goals: Poor    Frequency Min 2X/week   Barriers to discharge        Co-evaluation PT/OT/SLP Co-Evaluation/Treatment: Yes Reason for Co-Treatment: Complexity of the patient's impairments (multi-system involvement);Necessary to address cognition/behavior during functional activity;For patient/therapist safety PT goals addressed during session: Mobility/safety with mobility;Balance;Strengthening/ROM OT goals addressed during session: ADL's and self-care       AM-PAC PT "6 Clicks" Mobility  Outcome Measure Help needed turning from your back to your side while in a flat bed without using bedrails?: Total Help needed moving from lying on your back to sitting  on the side of a flat bed without using bedrails?: Total Help needed moving to and from a bed to a chair (including a wheelchair)?: Total Help needed standing up from a chair using your arms (e.g., wheelchair or bedside chair)?: Total Help needed to walk in hospital room?: Total Help needed climbing 3-5 steps with a railing? : Total 6 Click Score: 6    End of Session   Activity Tolerance: Other (comment) (limited by anxiety and restlessness) Patient left: in bed;with call bell/phone within reach;with bed alarm set Nurse Communication: Mobility status;Need  for lift equipment PT Visit Diagnosis: Other abnormalities of gait and mobility (R26.89);Muscle weakness (generalized) (M62.81)    Time: 0940-1006 PT Time Calculation (min) (ACUTE ONLY): 26 min   Charges:   PT Evaluation $PT Eval Low Complexity: [place], PT, DPT Acute Rehabilitation Pager: [phone] Office [phone]   Zenaida Niece 06/28/2021, 10:30 AM

## 2021-06-28 NOTE — Evaluation (Signed)
Occupational Therapy Evaluation Patient Details Name: Alfred Mercado MRN: [id number] DOB: May 11, 1936 Today's Date: 06/28/2021   History of Present Illness Pt is a 85 yr old male who presented to [facility] hospital on 06/26/2021 due to acute encephalopathy. Of note pt also with recent diagnosis of COVID-19 and PNA. PMH of sepsis, diastolic CHF, afib, dementia, COVID, CKD.   Clinical Impression   Pt presented from SNF setting and at this time limited participation to following commands and completion of tasks. Pt presents with strength in BUE but decrease in ability to use for functional tasks. Pt requiring total to total x2 for ADLS and bed mobility in session. Pt currently with functional limitations due to the deficits listed below (see OT Problem List).  Pt will benefit from skilled OT to increase their safety and independence with ADL and functional mobility for ADL to facilitate discharge to venue listed below.        Recommendations for follow up therapy are one component of a multi-disciplinary discharge planning process, led by the attending physician.  Recommendations may be updated based on patient status, additional functional criteria and insurance authorization.   Follow Up Recommendations  Skilled nursing-short term rehab (<3 hours/day)    Assistance Recommended at Discharge Frequent or constant Supervision/Assistance  Functional Status Assessment  Patient has had a recent decline in their functional status and demonstrates the ability to make significant improvements in function in a reasonable and predictable amount of time. (Difficulty to assess due to current cognition)  Equipment Recommendations  None recommended by OT    Recommendations for Other Services       Precautions / Restrictions Precautions Precautions: Fall Precaution Comments: monitor HR, tachy, afib Restrictions Weight Bearing Restrictions: No      Mobility Bed Mobility Overal bed mobility: Needs  Assistance Bed Mobility: Rolling;Supine to Sit;Sit to Supine Rolling: Total assist;+2 for physical assistance   Supine to sit: Total assist;+2 for physical assistance;HOB elevated Sit to supine: Total assist;+2 for physical assistance        Transfers Overall transfer level:  (did not attempt due to pt yelling to lay down)                        Balance Overall balance assessment: Needs assistance Sitting-balance support: Single extremity supported;Feet unsupported Sitting balance-[name]: Zero Sitting balance - Comments: totalA x2, pt reports stomach discomfort in sitting, actively attempting to return to supine immediately upon achieving seated position Postural control: Posterior lean                                 ADL either performed or assessed with clinical judgement   ADL Overall ADL's : Needs assistance/impaired Eating/Feeding: Maximal assistance;Cueing for safety;Cueing for sequencing;Sitting   Grooming: Total assistance;Cueing for safety;Cueing for sequencing;Bed level   Upper Body Bathing: Total assistance;Bed level   Lower Body Bathing: Total assistance;Bed level;+2 for physical assistance;+2 for safety/equipment   Upper Body Dressing : Total assistance;Bed level   Lower Body Dressing: Total assistance;+2 for physical assistance;+2 for safety/equipment;Bed level       Toileting- Clothing Manipulation and Hygiene: Total assistance;+2 for physical assistance;+2 for safety/equipment;Bed level         General ADL Comments: limited to bed level activity as pt yelling to lay back down     Vision   Additional Comments: attempted to assess but difficulties due to cognition status  Perception     Praxis      Pertinent Vitals/Pain Pain Assessment: Faces Faces Pain Scale: Hurts whole lot Breathing: normal Negative Vocalization: none Facial Expression: sad, frightened, frown Body Language: tense, distressed pacing,  fidgeting Consolability: no need to console PAINAD Score: 2 Pain Location: stomach Pain Descriptors / Indicators: Moaning Pain Intervention(s): Limited activity within patient's tolerance;Monitored during session;Repositioned     Hand Dominance Right   Extremity/Trunk Assessment Upper Extremity Assessment Upper Extremity Assessment: RUE deficits/detail;LUE deficits/detail RUE Deficits / Details: Difficulties to fully assess as resisting any ROM and decrease in ability to follow commands LUE Deficits / Details: Difficulties to fully assess as resisting any ROM and decrease in ability to follow commands   Lower Extremity Assessment Lower Extremity Assessment: Defer to PT evaluation RLE Deficits / Details: pt appears to have preference for knee and hip flexion at this time, PT notes significant stiffness in BLE, unable to fully assess ROM due to some resistance noted from patient. PT does not note full extension of knees or hips during session, with max extension of knees estimated to be at 20 degrees flexion LLE Deficits / Details: pt appears to have preference for knee and hip flexion at this time, PT notes significant stiffness in BLE, unable to fully assess ROM due to some resistance noted from patient. PT does not note full extension of knees or hips during session, with max extension of knees estimated to be at 20 degrees flexion   Cervical / Trunk Assessment Cervical / Trunk Assessment: Kyphotic   Communication Communication Communication: HOH   Cognition Arousal/Alertness: Awake/alert Behavior During Therapy: Anxious;Restless Overall Cognitive Status: Difficult to assess                                 General Comments: pt with history of dementia, does not follow commands. Pt demonstrates difficulty tracking with eyes, moving eyes toward direction of loud verbal stimulus and showing intermittent tracking .     General Comments  pt tachy from 108-154 during  session, monitor noting A-fib during session. Pt is very anxious during session when mobilized, does calm down when left to rest.    Exercises     Shoulder Instructions      Home Living Family/patient expects to be discharged to:: Skilled nursing facility                                 Additional Comments: pt is unable to provide history. Chart indicates pt was admitted from [facility]      Prior Functioning/Environment Prior Level of Function : Patient poor historian/Family not available             Mobility Comments: pt had been able to ambulate prior to admission in June 2022, otherwise no other information in chart on mobility noted          OT Problem List: Decreased strength;Decreased activity tolerance;Impaired balance (sitting and/or standing);Decreased safety awareness;Decreased knowledge of use of DME or AE;Pain;Decreased cognition      OT Treatment/Interventions: Self-care/ADL training;Therapeutic exercise;Therapeutic activities;Cognitive remediation/compensation;Patient/family education;Balance training    OT Goals(Current goals can be found in the care plan section) Acute Rehab OT Goals Patient Stated Goal: unable to report OT Goal Formulation: With patient Time For Goal Achievement: 07/22/21 Potential to Achieve Goals: Fair ADL Goals Pt Will Perform Eating: with set-up;sitting Pt Will Perform  Grooming: with min assist;sitting Pt Will Perform Upper Body Bathing: with mod assist;sitting Pt Will Perform Lower Body Bathing: with max assist;sit to/from stand  OT Frequency: Min 2X/week   Barriers to D/C:            Co-evaluation PT/OT/SLP Co-Evaluation/Treatment: Yes Reason for Co-Treatment: Complexity of the patient's impairments (multi-system involvement);Necessary to address cognition/behavior during functional activity PT goals addressed during session: Mobility/safety with mobility;Balance;Strengthening/ROM OT goals addressed  during session: ADL's and self-care      AM-PAC OT "6 Clicks" Daily Activity     Outcome Measure Help from another person eating meals?: Total Help from another person taking care of personal grooming?: Total Help from another person toileting, which includes using toliet, bedpan, or urinal?: Total Help from another person bathing (including washing, rinsing, drying)?: Total Help from another person to put on and taking off regular upper body clothing?: Total Help from another person to put on and taking off regular lower body clothing?: Total 6 Click Score: 6   End of Session    Activity Tolerance: Treatment limited secondary to agitation Patient left: in bed;with call bell/phone within reach;with bed alarm set (Nt applied mitts in session)  OT Visit Diagnosis: Unsteadiness on feet (R26.81);Other abnormalities of gait and mobility (R26.89);Repeated falls (R29.6);Muscle weakness (generalized) (M62.81);Pain                Time: 0940-1006 OT Time Calculation (min): 26 min Charges:  OT General Charges $OT Visit: 1 Visit OT Evaluation $OT Eval Low Complexity: [place] OTR/L  Acute Rehab Services  [phone] office number [phone] pager number   Joeseph Amor 06/28/2021, 11:15 AM

## 2021-06-28 NOTE — NC FL2 (Signed)
[place] MEDICAID FL2 LEVEL OF CARE SCREENING TOOL     IDENTIFICATION  Patient Name: Alfred Mercado Birthdate: [birth year] Sex: male Admission Date (Current Location): 06/26/2021  [facility] and [place] Number:  Producer, television/film/video and Address:  [facility], [address] [place], [place] [address]      Provider Number: [id number]  Attending Physician Name and Address:  Maretta Bees, MD  Relative Name and Phone Number:       Current Level of Care: Hospital Recommended Level of Care: Skilled Nursing Facility Prior Approval Number:    Date Approved/Denied:   PASRR Number: [id number]  Discharge Plan: SNF    Current Diagnoses: Patient Active Problem List   Diagnosis Date Noted   Delirium    CAP (community acquired pneumonia) 06/26/2021   COVID-19 virus infection 06/26/2021   Occult blood positive stool 06/26/2021   Hypercalcemia 06/26/2021   Elevated troponin 06/26/2021   Malnutrition of moderate degree 03/14/2021   Patient incapable of making informed decisions    Somnolence    Sepsis (HCC) 03/02/2021   Cellulitis 03/02/2021   Hyponatremia 03/02/2021   Renal failure    Hypotension 02/09/2021   Falls 02/09/2021   Syncope and collapse 02/09/2021   Severe sepsis (HCC) 01/25/2021   Iron deficiency anemia 01/25/2021   Generalized weakness    Acute-on-chronic kidney injury (HCC) 01/24/2021   Complicated UTI (urinary tract infection) 01/24/2021   Renal mass, left 01/24/2021   Staghorn renal calculus 01/24/2021   Microscopic hematuria 01/24/2021   Aortic aneurysm (HCC) 01/24/2021   Closed burst fracture of lumbar vertebra (HCC) 01/24/2021   Acute respiratory failure with hypoxia (HCC) 08/13/2020   UTI (urinary tract infection) 06/02/2020   Community acquired pneumonia of right lower lobe of lung 06/02/2020   Acute hypoxemic respiratory failure (HCC) 06/02/2020   Hypokalemia 06/02/2020   Acute diastolic CHF (congestive heart  failure) (HCC) 02/06/2020   Exertional shortness of breath 10/14/2019   Atrial fibrillation with RVR (HCC) 10/14/2019   Pleural effusion    Chronic heart failure with preserved ejection fraction (HCC)     Orientation RESPIRATION BLADDER Height & Weight     Self  Normal Incontinent, Indwelling catheter Weight: 160 lb (72.6 kg) Height:  6\' 1"  (185.4 cm)  BEHAVIORAL SYMPTOMS/MOOD NEUROLOGICAL BOWEL NUTRITION STATUS      Incontinent Diet (see dc summary)  AMBULATORY STATUS COMMUNICATION OF NEEDS Skin   Extensive Assist Verbally Normal                       Personal Care Assistance Level of Assistance  Bathing, Feeding, Dressing Bathing Assistance: Maximum assistance Feeding assistance: Limited assistance Dressing Assistance: Limited assistance     Functional Limitations Info             SPECIAL CARE FACTORS FREQUENCY  PT (By licensed PT), OT (By licensed OT)     PT Frequency: 5x/week OT Frequency: 5x/week            Contractures Contractures Info: Not present    Additional Factors Info  Isolation Precautions, Code Status, Allergies Code Status Info: Full Allergies Info: NKA     Isolation Precautions Info: COVID + 06/26/21     Current Medications (06/28/2021):  This is the current hospital active medication list Current Facility-Administered Medications  Medication Dose Route Frequency Provider Last Rate Last Admin   0.9 %  sodium chloride infusion   Intravenous Continuous Ghimire, 13/09/2020, MD 125 mL/hr at  06/28/21 0806 Rate Change at 06/28/21 0806   acetaminophen (TYLENOL) tablet 650 mg  650 mg Oral Q6H PRN Toy Baker, MD       Or   acetaminophen (TYLENOL) suppository 650 mg  650 mg Rectal Q6H PRN Doutova, Anastassia, MD       acetaminophen (TYLENOL) tablet 650 mg  650 mg Oral QHS Doutova, Anastassia, MD   650 mg at 06/26/21 2155   albuterol (VENTOLIN HFA) 108 (90 Base) MCG/ACT inhaler 1 puff  1 puff Inhalation Q2H PRN Doutova, Nyoka Lint, MD        apixaban (ELIQUIS) tablet 2.5 mg  2.5 mg Oral BID Jonetta Osgood, MD   2.5 mg at 06/28/21 1107   azithromycin (ZITHROMAX) 500 mg in sodium chloride 0.9 % 250 mL IVPB  500 mg Intravenous Q24H Doutova, Anastassia, MD 250 mL/hr at 06/27/21 1817 500 mg at 06/27/21 1817   cefTRIAXone (ROCEPHIN) 2 g in sodium chloride 0.9 % 100 mL IVPB  2 g Intravenous Q24H Doutova, Anastassia, MD 200 mL/hr at 06/27/21 1645 2 g at 06/27/21 1645   Chlorhexidine Gluconate Cloth 2 % PADS 6 each  6 each Topical Daily Doutova, Anastassia, MD   6 each at 06/28/21 1055   ondansetron (ZOFRAN) tablet 4 mg  4 mg Oral Q6H PRN Toy Baker, MD       Or   ondansetron (ZOFRAN) injection 4 mg  4 mg Intravenous Q6H PRN Doutova, Anastassia, MD       pantoprazole (PROTONIX) injection 40 mg  40 mg Intravenous Q12H Doutova, Anastassia, MD   40 mg at 06/28/21 1056   potassium chloride SA (KLOR-CON) CR tablet 40 mEq  40 mEq Oral Q4H Ghimire, Shanker M, MD   40 mEq at 06/28/21 1102   tamsulosin (FLOMAX) capsule 0.4 mg  0.4 mg Oral Daily Toy Baker, MD   0.4 mg at 06/28/21 1107     Discharge Medications: Please see discharge summary for a list of discharge medications.  Relevant Imaging Results:  Relevant Lab Results:   Additional Information ss#[id number]. COVID vaccinations 01/02/20 and 01/26/20, 11/05/20  Benard Halsted, LCSW

## 2021-06-28 NOTE — Progress Notes (Signed)
Initial Nutrition Assessment  DOCUMENTATION CODES:  Severe malnutrition in context of chronic illness  INTERVENTION:  Continue diet per SLP.  Add Ensure Plus High Protein po TID, each supplement provides 350 kcal and 20 grams of protein.   Add MVI with minerals daily.  Encourage PO intake.  NUTRITION DIAGNOSIS:  Severe Malnutrition related to chronic illness (dementia) as evidenced by percent weight loss, severe fat depletion, severe muscle depletion.  GOAL:  Patient will meet greater than or equal to 90% of their needs  MONITOR:  PO intake, Supplement acceptance, Labs, Weight trends, I & O's  REASON FOR ASSESSMENT:  Consult Assessment of nutrition requirement/status  ASSESSMENT:  85 yo male with a PMH of dementia, PAF on Eliquis, staghorn calculi-who was recently diagnosed with pneumonia at his nursing facility presented with altered mental status-upon further evaluation he was found to have hypercalcemia, AKI. 10/31 - advanced to Dysphagia 3/thins 11/1 - downgraded to Dysphagia 2/thins  Pt minimally interactive with RD during visit.  Per Epic, pt has lost ~21 lbs (11.4%) in the last 5 months, which is significant and severe for the time frame.  Recommend adding Ensure TID and MVI with minerals daily.  Medications: reviewed; Protonix BID, Klor-Con 40 mEq once, NaCl @ 125 ml/hr  Labs: reviewed; K 3.1 (L - trending up), CBG  94 HbA1c: 5.5% (06/27/2021)  NUTRITION - FOCUSED PHYSICAL EXAM: Flowsheet Row Most Recent Value  Orbital Region Moderate depletion  Upper Arm Region Severe depletion  Thoracic and Lumbar Region Moderate depletion  Buccal Region Severe depletion  Temple Region Severe depletion  Clavicle Bone Region Severe depletion  Clavicle and Acromion Bone Region Severe depletion  Scapular Bone Region Severe depletion  Dorsal Hand Unable to assess  [[place]itt[place]]  Patellar Region Severe depletion  Anterior Thigh Region Severe depletion  Posterior Calf Region  Severe depletion  Edema (RD Assessment) None  Hair Reviewed  Eyes Reviewed  Mouth Reviewed  Skin Reviewed  Nails Unable to assess   Diet Order:   Diet Order             DIET DYS 2 Room service appropriate? Yes; Fluid consistency: Thin  Diet effective now                  EDUCATION NEEDS:  Not appropriate for education at this time  Skin:  Skin Assessment: Reviewed RN Assessment  Last BM:  06/28/21 - Type 6, green, medium  Height:  Ht Readings from Last 1 Encounters:  06/26/21 6\' 1"  (1.854 m)   Weight:  Wt Readings from Last 1 Encounters:  06/26/21 72.6 kg   BMI:  Body mass index is 21.11 kg/m.  Estimated Nutritional Needs:  Kcal:  2000-2200 Protein:  90-105 grams Fluid:  >2 L  06/28/21, RD, LDN (she/her/hers) Registered Dietitian I Pager #: [phone] After-Hours/Weekend Pager # in [place]

## 2021-06-29 ENCOUNTER — Inpatient Hospital Stay (HOSPITAL_COMMUNITY): Payer: Medicare Other

## 2021-06-29 DIAGNOSIS — N179 Acute kidney failure, unspecified: Secondary | ICD-10-CM | POA: Diagnosis not present

## 2021-06-29 DIAGNOSIS — I5032 Chronic diastolic (congestive) heart failure: Secondary | ICD-10-CM | POA: Diagnosis not present

## 2021-06-29 DIAGNOSIS — J189 Pneumonia, unspecified organism: Secondary | ICD-10-CM | POA: Diagnosis not present

## 2021-06-29 DIAGNOSIS — Z7189 Other specified counseling: Secondary | ICD-10-CM | POA: Diagnosis not present

## 2021-06-29 DIAGNOSIS — U071 COVID-19: Secondary | ICD-10-CM | POA: Diagnosis not present

## 2021-06-29 DIAGNOSIS — N390 Urinary tract infection, site not specified: Secondary | ICD-10-CM | POA: Diagnosis not present

## 2021-06-29 LAB — COMPREHENSIVE METABOLIC PANEL
ALT: 15 U/L (ref 0–44)
AST: 25 U/L (ref 15–41)
Albumin: 2.3 g/dL — ABNORMAL LOW (ref 3.5–5.0)
Alkaline Phosphatase: 57 U/L (ref 38–126)
Anion gap: 7 (ref 5–15)
BUN: 39 mg/dL — ABNORMAL HIGH (ref 8–23)
CO2: 15 mmol/L — ABNORMAL LOW (ref 22–32)
Calcium: 10.3 mg/dL (ref 8.9–10.3)
Chloride: 125 mmol/L — ABNORMAL HIGH (ref 98–111)
Creatinine, Ser: 2.68 mg/dL — ABNORMAL HIGH (ref 0.61–1.24)
GFR, Estimated: 23 mL/min — ABNORMAL LOW (ref >60)
Glucose, Bld: 93 mg/dL (ref 70–99)
Potassium: 3 mmol/L — ABNORMAL LOW (ref 3.5–5.1)
Sodium: 147 mmol/L — ABNORMAL HIGH (ref 135–145)
Total Bilirubin: 0.6 mg/dL (ref 0.3–1.2)
Total Protein: 5 g/dL — ABNORMAL LOW (ref 6.5–8.1)

## 2021-06-29 LAB — PTH, INTACT AND CALCIUM
Calcium, Total (PTH): 10.7 mg/dL — ABNORMAL HIGH (ref 8.6–10.2)
PTH: 9 pg/mL — ABNORMAL LOW (ref 15–65)

## 2021-06-29 LAB — CBC
HCT: 31.7 % — ABNORMAL LOW (ref 39.0–52.0)
Hemoglobin: 10.6 g/dL — ABNORMAL LOW (ref 13.0–17.0)
MCH: 29.3 pg (ref 26.0–34.0)
MCHC: 33.4 g/dL (ref 30.0–36.0)
MCV: 87.6 fL (ref 80.0–100.0)
Platelets: 312 10*3/uL (ref 150–400)
RBC: 3.62 MIL/uL — ABNORMAL LOW (ref 4.22–5.81)
RDW: 16.3 % — ABNORMAL HIGH (ref 11.5–15.5)
WBC: 9.1 10*3/uL (ref 4.0–10.5)
nRBC: 0 % (ref 0.0–0.2)

## 2021-06-29 LAB — MAGNESIUM: Magnesium: 1.6 mg/dL — ABNORMAL LOW (ref 1.7–2.4)

## 2021-06-29 MED ORDER — FUROSEMIDE 10 MG/ML IJ SOLN
80.0000 mg | Freq: Once | INTRAMUSCULAR | Status: AC
  Administered 2021-06-29: 80 mg via INTRAVENOUS
  Filled 2021-06-29: qty 8

## 2021-06-29 MED ORDER — SODIUM CHLORIDE 0.9 % IV SOLN: INTRAVENOUS | Status: DC

## 2021-06-29 MED ORDER — POTASSIUM CHLORIDE CRYS ER 20 MEQ PO TBCR
40.0000 meq | EXTENDED_RELEASE_TABLET | ORAL | Status: AC
  Administered 2021-06-29 (×2): 40 meq via ORAL
  Filled 2021-06-29 (×3): qty 2

## 2021-06-29 MED ORDER — PREDNISONE 20 MG PO TABS
40.0000 mg | ORAL_TABLET | Freq: Every day | ORAL | Status: DC
  Administered 2021-06-30 – 2021-07-01 (×2): 40 mg via ORAL
  Filled 2021-06-29 (×2): qty 2

## 2021-06-29 MED ORDER — ACETAMINOPHEN 500 MG PO TABS
1000.0000 mg | ORAL_TABLET | Freq: Three times a day (TID) | ORAL | Status: DC
  Administered 2021-06-29 – 2021-07-04 (×12): 1000 mg via ORAL
  Filled 2021-06-29 (×14): qty 2

## 2021-06-29 MED ORDER — MAGNESIUM SULFATE 2 GM/50ML IV SOLN
2.0000 g | Freq: Once | INTRAVENOUS | Status: AC
  Administered 2021-06-29: 2 g via INTRAVENOUS
  Filled 2021-06-29: qty 50

## 2021-06-29 NOTE — Consult Note (Signed)
Consultation Note Date: 06/29/2021   Patient Name: Alfred Mercado  DOB: 1936/07/16  MRN: [id number]  Age / Sex: 85 y.o., male  PCP: Karna Dupes, MD Referring Physician: Maretta Bees, MD  Reason for Consultation: Establishing goals of care  HPI/Patient Profile: 85 y.o. male  with past medical history of dementia, PAF on Eliquis, heart failure, pleural effusions, COPD, CKD. and staghorn calculi admitted on 06/26/2021 with AMS. Recently diagnosed with pneumonia at his SNF. Also have to have AKI and hypercalcemia. Hypercalcemia continues despite treatment and vitamin d3 also elevated.  CT chest/abdomen without any signs of lymphadenopathy. Diagnosed with Covid-19 2 weeks ago. No active infection now. PMT consulted to discuss GOC.   Clinical Assessment and Goals of Care: Have seen patient two days in a row - patient is more communicative today than yesterday but remains quite disoriented. Certainly unable to make decisions for himself. He is quite fearful of staff - yells when being touched or turned. I attempted to calm him as I helped the nurse provide daily cares for him. He c/o pain throughout caring for him but could not tell me what kind of pain or where.   Again, made attempts to reach his only listed contact - friend/pastor Henderson Cloud. No answer - voicemail left with call back number. Did not receive call back from message left yesterday.   Primary Decision Maker Friend? Have been able to get in touch - patient unable to make decisions for himself  SUMMARY OF RECOMMENDATIONS   - consider seeking APS guardianship as patient unable to make decisions for himself and no contacts have been located? - initiate schedule tylenol 1000 mg TID - expresses pain but unable to tell me where/what kind - will follow for decision maker - consider 2 physician DNR if unable to contact decision maker  Code Status/Advance Care Planning: Full  code  DNR is more appropriate d/t poor outcomes in similar hospitalized patients, as the cause of the arrest is likely associated with chronic/terminal disease rather than a reversible acute cardio-pulmonary event.   Symptom Management:  Scheduled tylenol  Discharge Planning: To Be Determined      Primary Diagnoses: Present on Admission: . CAP (community acquired pneumonia) . UTI (urinary tract infection) . Severe sepsis (HCC) . Iron deficiency anemia . Hypotension . Hypokalemia . Complicated UTI (urinary tract infection) . Chronic heart failure with preserved ejection fraction (HCC) . Acute-on-chronic kidney injury (HCC) . COVID-19 virus infection . Occult blood positive stool . Hypercalcemia . Elevated troponin   I have reviewed the medical record, interviewed the patient and family, and examined the patient. The following aspects are pertinent.  Past Medical History:  Diagnosis Date  . Acute heart failure with preserved ejection fraction (HCC)   . Atrial fibrillation with rapid ventricular response (HCC) 07/11/2019   Social History   Socioeconomic History  . Marital status: Divorced    Spouse name: Not on file  . Number of children: Not on file  . Years of education: Not on file  . Highest education level: Not on file  Occupational History  . Not on file  Tobacco Use  . Smoking status: Light Smoker  . Smokeless tobacco: Never  Substance and Sexual Activity  . Alcohol use: Not Currently  . Drug use: Never  . Sexual activity: Not Currently  Other Topics Concern  . Not on file  Social History Narrative  . Not on file   Social Determinants of Health   Financial Resource Strain:  Not on file  Food Insecurity: Not on file  Transportation Needs: Not on file  Physical Activity: Not on file  Stress: Not on file  Social Connections: Not on file   Family History  Problem Relation Age of Onset  . Aneurysm Father    Scheduled Meds: . acetaminophen  1,000 mg  Oral TID  . apixaban  2.5 mg Oral BID  . Chlorhexidine Gluconate Cloth  6 each Topical Daily  . feeding supplement  237 mL Oral TID BM  . multivitamin with minerals  1 tablet Oral Daily  . pantoprazole (PROTONIX) IV  40 mg Intravenous Q12H  . [START ON 06/30/2021] predniSONE  40 mg Oral Q breakfast  . tamsulosin  0.4 mg Oral Daily   Continuous Infusions: . sodium chloride 125 mL/hr at 06/29/21 1544  . azithromycin Stopped (06/28/21 1935)  . cefTRIAXone (ROCEPHIN)  IV Stopped (06/28/21 1631)  . magnesium sulfate bolus IVPB 2 g (06/29/21 1548)   PRN Meds:.albuterol, ondansetron **OR** ondansetron (ZOFRAN) IV No Known Allergies Review of Systems  Unable to perform ROS: Mental status change   Physical Exam Constitutional:      General: He is not in acute distress.    Comments: Does not follow commands or answer questions appropriately  Pulmonary:     Effort: Pulmonary effort is normal.  Skin:    General: Skin is warm and dry.  Neurological:     Mental Status: He is alert.    Vital Signs: BP 115/74 (BP Location: Right Arm)   Pulse (!) 105   Temp (!) 97.1 F (36.2 C) (Axillary)   Resp 16   Ht 6\' 1"  (1.854 m)   Wt 72.6 kg   SpO2 90%   BMI 21.11 kg/m  Pain Scale: PAINAD   Pain Score: 0-No pain   SpO2: SpO2: 90 % O2 Device:SpO2: 90 % O2 Flow Rate: .O2 Flow Rate (L/min): 2 L/min  IO: Intake/output summary:  Intake/Output Summary (Last 24 hours) at 06/29/2021 1626 Last data filed at 06/29/2021 1600 Gross per 24 hour  Intake 350 ml  Output 900 ml  Net -550 ml    LBM: Last BM Date: 06/28/21 Baseline Weight: Weight: 72.6 kg Most recent weight: Weight: 72.6 kg     Palliative Assessment/Data: PPS 30%   Flowsheet Rows    Flowsheet Row Most Recent Value  Intake Tab   Referral Department Hospitalist  Unit at Time of Referral ER  Palliative Care Primary Diagnosis Sepsis/Infectious Disease  Date Notified 06/26/21  Palliative Care Type New Palliative care  Reason for  referral Clarify Goals of Care  Date of Admission 06/26/21  Date first seen by Palliative Care 06/28/21  # of days Palliative referral response time 2 Day(s)  # of days IP prior to Palliative referral 0  Clinical Assessment   Psychosocial & Spiritual Assessment   Palliative Care Outcomes       Time Total: 55 minutes Greater than 50%  of this time was spent counseling and coordinating care related to the above assessment and plan.  Juel Burrow, DNP, AGNP-C Palliative Medicine Team [phone] Pager: [phone]

## 2021-06-29 NOTE — Care Management Important Message (Signed)
Important Message  Patient Details  Name: ABDON PETROSKY MRN: [id number] Date of Birth: 02/11/1936   Medicare Important Message Given:  Yes     Rajni Holsworth 06/29/2021, 3:10 PM

## 2021-06-29 NOTE — Progress Notes (Addendum)
PROGRESS NOTE        PATIENT DETAILS Name: Alfred Mercado Age: 85 y.o. Sex: male Date of Birth: [birth year] Admit Date: 06/26/2021 Admitting Physician Toy Baker, MD [id number], Rozanna Boer, MD  Brief Narrative: Patient is a 85 y.o. male with history of dementia, PAF on Eliquis, staghorn calculi-who was recently diagnosed with pneumonia at his nursing facility presented with altered mental status-upon further evaluation he was found to have hypercalcemia, AKI.  See below for further details.  Subjective: Lying comfortably in bed-no chest pain or shortness of breath.  Able to follow simple commands-but still gets agitated and pushes me away.  Objective: Vitals: Blood pressure 115/74, pulse (!) 105, temperature (!) 97.1 F (36.2 C), temperature source Axillary, resp. rate 16, height 6\' 1"  (1.854 m), weight 72.6 kg, SpO2 90 %.   Exam: Gen Exam:not in any distress HEENT:atraumatic, normocephalic Chest: B/L clear to auscultation anteriorly CVS:S1S2 regular Abdomen:soft non tender, non distended Extremities:no edema Neurology: Difficult exam but seems to be nonfocal. Skin: no rash   Pertinent Labs/Radiology: Recent Labs  Lab 06/26/21 1426 06/26/21 1948 06/27/21 0925 06/28/21 0610 06/28/21 0725 06/29/21 0109  WBC 7.5   < > 6.8  --    < > 9.1  HGB 9.6*   < > 11.1*  --    < > 10.6*  PLT 297   < > 284  --    < > 312  NA 136   < > 140 142  --  147*  K 2.7*   < > 3.0* 3.1*  --  3.0*  CREATININE 3.11*   < > 2.61* 2.63*  --  2.68*  AST 18  --  17 22  --  25  ALT 16  --  14 12  --  15  ALKPHOS 69  --  65 63  --  57  BILITOT 0.3  --  0.7 0.5  --  0.6   < > = values in this interval not displayed.   11/1>> PTH: 9 (low) 11/1>> 25 (oh) vitamin D: 63.9 (normal) 11/1>> 1, 2 (OH) vitamin D: 95.6 (high) 11/2>> PTHrP: Pending  10/31>>Blood culture: No growth 10/31>> urine culture: No growth  10/31>>CXR: Chronic unchanged right-sided pleural  effusion. 10/31>> CT head: No acute intracranial pathology, stable left frontotemporal hygroma. 10/31>> CT renal stone study: No acute abnormality, multiple large bilateral renal calculi as previously demonstrated. 11/3>> CT chest: No findings to suggest lymphoma/sarcoidosis, right upper/left lower lobe bronchopneumonia, moderate chronic right pleural effusion.  Underlying COPD.  Assessment/Plan: Acute metabolic encephalopathy: Due to AKI/hypercalcemia-superimposed with dementia at baseline.  Continues to be confused and agitated-unclear how different he is from his baseline.  Continues to have persistent AKI-hypercalcemia has somewhat improved.   AKI on CKD stage IIIb: AKI multifactorial-hemodynamically mediated and from hypercalcemia.  UA without proteinuria, renal CT without hydronephrosis.  Continue supportive care and avoid nephrotoxic agents.  Creatinine seems to have plateaued around 2.6 range.  Hypercalcemia: Vitamin D3 levels on the higher side-concerned that this may be lymphoma or sarcoidosis.  CT chest/abdomen without any signs of lymphadenopathy no signs of sarcoidosis.  Continues to have hypercalcemia when corrected with calcium.  Remains on IV fluids-we will give 1 dose of IV Lasix to see if this helps with the hypercalcemia.  Since vitamin D3 is elevated-we will start him on empiric prednisone.  Unfortunately-given overall  frailty-and poor quality of life-suspect that further work-up to determine etiology may not change outcome no management.  We will continue to follow closely.  Probable aspiration pneumonia: Afebrile-CT chest as above-we will plan on at least 5 days of empiric antimicrobial therapy.  Appreciate SLP eval.  Asymptomatic bacteriuria: UA grossly abnormal-unable to obtain history to see if he has symptoms consistent with UTI (demented/agitated).  Thankfully urine culture negative-in any event already on IV antibiotics.    COVID-19 infection: Per ED notes and  H&P-diagnosed 2 weeks ago-does not appear to have any active infection at this point.  No need for isolation.  Normocytic anemia: Suspect has a combination of iron deficiency and anemia of chronic disease at baseline-worsened likely due to IV fluid hydration-required 2 units of PRBC transfusion.  No overt GI bleeding apparent even though FOBT positive.  Resume Eliquis.  Appreciate GI input.    Chronic atrial fibrillation: We will resume Eliquis today.  ?Dementia/cognitive dysfunction at baseline: Confused-expect worsening delirium during this hospital stay.  Goals of care: Frail-does not appear to be a great candidate for aggressive care-briefly discussed over the phone with his pastor-Charles Greer Pickerel (point of contact officiate).  Understands long-term prognosis is poor-we will place a palliative care consult for continued goals of care discussion.  Will await further recommendations from palliative care.  Nutrition Status: Nutrition Problem: Severe Malnutrition Etiology: chronic illness (dementia) Signs/Symptoms: percent weight loss, severe fat depletion, severe muscle depletion Percent weight loss: 11.4 % Interventions: Ensure Enlive (each supplement provides 350kcal and 20 grams of protein), MVI    Procedures: None Consults: GI DVT Prophylaxis:  Code Status:Full code  Family Communication: Sherri Sear (contact person/pastor) [phone] updated over the phone on 11/1  Time spent: 35 minutes-Greater than 50% of this time was spent in counseling, explanation of diagnosis, planning of further management, and coordination of care.   Disposition Plan: Status is: Inpatient  Remains inpatient appropriate because: Altered mental status not yet at baseline-hypercalcemia/AKI-on IV fluids/calcitonin injections.  Not stable for discharge.   Diet: Diet Order             DIET DYS 2 Room service appropriate? Yes; Fluid consistency: Thin  Diet effective now                      Antimicrobial agents: Anti-infectives (From admission, onward)    Start     Dose/Rate Route Frequency Ordered Stop   06/27/21 1800  azithromycin (ZITHROMAX) 500 mg in sodium chloride 0.9 % 250 mL IVPB        500 mg 250 mL/hr over 60 Minutes Intravenous Every 24 hours 06/26/21 1952 07/02/21 1759   06/27/21 1600  cefTRIAXone (ROCEPHIN) 2 g in sodium chloride 0.9 % 100 mL IVPB        2 g 200 mL/hr over 30 Minutes Intravenous Every 24 hours 06/26/21 1952 07/02/21 1559   06/26/21 1430  cefTRIAXone (ROCEPHIN) 2 g in sodium chloride 0.9 % 100 mL IVPB        2 g 200 mL/hr over 30 Minutes Intravenous  Once 06/26/21 1427 06/26/21 1700   06/26/21 1430  azithromycin (ZITHROMAX) 500 mg in sodium chloride 0.9 % 250 mL IVPB        500 mg 250 mL/hr over 60 Minutes Intravenous  Once 06/26/21 1427 06/26/21 1722        MEDICATIONS: Scheduled Meds:  acetaminophen  650 mg Oral QHS   apixaban  2.5 mg Oral BID   Chlorhexidine Gluconate Cloth  6 each Topical Daily   feeding supplement  237 mL Oral TID BM   multivitamin with minerals  1 tablet Oral Daily   pantoprazole (PROTONIX) IV  40 mg Intravenous Q12H   potassium chloride  40 mEq Oral Q4H   tamsulosin  0.4 mg Oral Daily   Continuous Infusions:  azithromycin Stopped (06/28/21 1935)   cefTRIAXone (ROCEPHIN)  IV Stopped (06/28/21 1631)   PRN Meds:.acetaminophen **OR** acetaminophen, albuterol, ondansetron **OR** ondansetron (ZOFRAN) IV   I have personally reviewed following labs and imaging studies  LABORATORY DATA: CBC: Recent Labs  Lab 06/26/21 1426 06/26/21 1948 06/26/21 2028 06/27/21 0441 06/27/21 0925 06/28/21 0725 06/29/21 0109  WBC 7.5 5.4  --  6.6 6.8 9.6 9.1  NEUTROABS 5.7  --   --   --  5.5  --   --   HGB 9.6* 7.3* 6.5* 10.8* 11.1* 11.8* 10.6*  HCT 28.5* 21.7* 19.0* 31.7* 32.3* 34.5* 31.7*  MCV 86.4 86.8  --  86.4 87.3 87.3 87.6  PLT 297 236  --  258 284 287 312     Basic Metabolic Panel: Recent Labs  Lab  06/26/21 1426 06/26/21 1839 06/26/21 1948 06/26/21 2028 06/27/21 0925 06/28/21 0610 06/29/21 0109  NA 136  --  137 140 140 142 147*  K 2.7*  --  3.0* 2.5* 3.0* 3.1* 3.0*  CL 110  --  112*  --  116* 118* 125*  CO2 17*  --  20*  --  17* 16* 15*  GLUCOSE 156*  --  102*  --  119* 97 93  BUN 47*  --  42*  --  38* 37* 39*  CREATININE 3.11*  --  2.69*  --  2.61* 2.63* 2.68*  CALCIUM 12.7*  --  12.3*  --  10.9*  10.7* 10.5* 10.3  MG  --  2.0  --   --  1.8  --  1.6*  PHOS  --  4.0  --   --  2.9  --   --      GFR: Estimated Creatinine Clearance: 21.4 mL/min (A) (by C-G formula based on SCr of 2.68 mg/dL (H)).  Liver Function Tests: Recent Labs  Lab 06/26/21 1426 06/27/21 0925 06/28/21 0610 06/29/21 0109  AST 18 17 22 25   ALT 16 14 12 15   ALKPHOS 69 65 63 57  BILITOT 0.3 0.7 0.5 0.6  PROT 5.5* 5.1* 5.2* 5.0*  ALBUMIN 2.6* 2.4* 2.4* 2.3*    No results for input(s): LIPASE, AMYLASE in the last 168 hours. Recent Labs  Lab 06/26/21 2030  AMMONIA 21     Coagulation Profile: Recent Labs  Lab 06/26/21 1426  INR 1.5*     Cardiac Enzymes: Recent Labs  Lab 06/26/21 1839  CKTOTAL 8*     BNP (last 3 results) No results for input(s): PROBNP in the last 8760 hours.  Lipid Profile: No results for input(s): CHOL, HDL, LDLCALC, TRIG, CHOLHDL, LDLDIRECT in the last 72 hours.  Thyroid Function Tests: Recent Labs    06/27/21 0441  TSH 0.590     Anemia Panel: Recent Labs    06/27/21 0441  VITAMINB12 351  FOLATE 18.4  FERRITIN 79  TIBC 197*  IRON 58  RETICCTPCT 2.0     Urine analysis:    Component Value Date/Time   COLORURINE STRAW (A) 06/27/2021 0106   APPEARANCEUR CLEAR 06/27/2021 0106   LABSPEC 1.005 06/27/2021 0106   PHURINE 7.0 06/27/2021 0106   GLUCOSEU NEGATIVE 06/27/2021 0106  HGBUR LARGE (A) 06/27/2021 0106   BILIRUBINUR NEGATIVE 06/27/2021 0106   BILIRUBINUR negative 06/27/2020 1140   KETONESUR NEGATIVE 06/27/2021 0106   PROTEINUR  NEGATIVE 06/27/2021 0106   UROBILINOGEN 1.0 06/27/2020 1140   NITRITE NEGATIVE 06/27/2021 0106   LEUKOCYTESUR LARGE (A) 06/27/2021 0106    Sepsis Labs: Lactic Acid, Venous    Component Value Date/Time   LATICACIDVEN 1.1 06/26/2021 1815    MICROBIOLOGY: Recent Results (from the past 240 hour(s))  Urine Culture     Status: None   Collection Time: 06/26/21  2:09 PM   Specimen: In/Out Cath Urine  Result Value Ref Range Status   Specimen Description IN/OUT CATH URINE  Final   Special Requests NONE  Final   Culture   Final    NO GROWTH Performed at [facility], [place] [address], [place], [place] [address]    Report Status 06/27/2021 FINAL  Final  Blood Culture (routine x 2)     Status: None (Preliminary result)   Collection Time: 06/26/21  2:26 PM   Specimen: BLOOD LEFT FOREARM  Result Value Ref Range Status   Specimen Description BLOOD LEFT FOREARM  Final   Special Requests   Final    BOTTLES DRAWN AEROBIC AND ANAEROBIC Blood Culture results may not be optimal due to an excessive volume of blood received in culture bottles   Culture   Final    NO GROWTH 3 DAYS Performed at [facility], [place] [address], [place], [place] [address]    Report Status PENDING  Incomplete  Blood Culture (routine x 2)     Status: None (Preliminary result)   Collection Time: 06/26/21  2:31 PM   Specimen: BLOOD RIGHT FOREARM  Result Value Ref Range Status   Specimen Description BLOOD RIGHT FOREARM  Final   Special Requests   Final    BOTTLES DRAWN AEROBIC AND ANAEROBIC Blood Culture adequate volume   Culture   Final    NO GROWTH 3 DAYS Performed at [facility], [place] [address], [place], [place] [address]    Report Status PENDING  Incomplete  Resp Panel by RT-PCR (Flu A&B, Covid) Nasopharyngeal Swab     Status: Abnormal   Collection Time: 06/26/21  3:06 PM   Specimen: Nasopharyngeal Swab; Nasopharyngeal(NP) swabs in vial transport medium  Result Value Ref Range Status   SARS  Coronavirus 2 by RT PCR POSITIVE (A) NEGATIVE Final    Comment: RESULT CALLED TO, READ BACK BY AND VERIFIED WITH: Shon Hale RN [id number] 06/26/21 A BROWNING (NOTE) SARS-CoV-2 target nucleic acids are DETECTED.  The SARS-CoV-2 RNA is generally detectable in upper respiratory specimens during the acute phase of infection. Positive results are indicative of the presence of the identified virus, but do not rule out bacterial infection or co-infection with other pathogens not detected by the test. Clinical correlation with patient history and other diagnostic information is necessary to determine patient infection status. The expected result is Negative.  Fact Sheet for Patients: EntrepreneurPulse.com.au  Fact Sheet for Healthcare Providers: IncredibleEmployment.be  This test is not yet approved or cleared by the [place] FDA and  has been authorized for detection and/or diagnosis of SARS-CoV-2 by FDA under an Emergency Use Authorization (EUA).  This EUA will remain in effect (meaning this test can  be used) for the duration of  the COVID-19 declaration under Section 564(b)(1) of the Act, 21 U.S.C. section 360bbb-3(b)(1), unless the authorization is terminated or revoked sooner.  Influenza A by PCR NEGATIVE NEGATIVE Final   Influenza B by PCR NEGATIVE NEGATIVE Final    Comment: (NOTE) The Xpert Xpress SARS-CoV-2/FLU/RSV plus assay is intended as an aid in the diagnosis of influenza from Nasopharyngeal swab specimens and should not be used as a sole basis for treatment. Nasal washings and aspirates are unacceptable for Xpert Xpress SARS-CoV-2/FLU/RSV testing.  Fact Sheet for Patients: EntrepreneurPulse.com.au  Fact Sheet for Healthcare Providers: IncredibleEmployment.be  This test is not yet approved or cleared by the [place] FDA and has been authorized for detection and/or diagnosis of SARS-CoV-2  by FDA under an Emergency Use Authorization (EUA). This EUA will remain in effect (meaning this test can be used) for the duration of the COVID-19 declaration under Section 564(b)(1) of the Act, 21 U.S.C. section 360bbb-3(b)(1), unless the authorization is terminated or revoked.  Performed at [facility], [place] [address] [place], [place] [address]     RADIOLOGY STUDIES/RESULTS: CT CHEST WO CONTRAST  Result Date: 06/29/2021 CLINICAL DATA:  85 year old male with history of persistent cough. Hypercalcemia. Evaluate for potential lymphoma or sarcoidosis. EXAM: CT CHEST WITHOUT CONTRAST TECHNIQUE: Multidetector CT imaging of the chest was performed following the standard protocol without IV contrast. COMPARISON:  Chest CT 01/24/2021. FINDINGS: Cardiovascular: Heart size is normal. There is no significant pericardial fluid, thickening or pericardial calcification. There is aortic atherosclerosis, as well as atherosclerosis of the great vessels of the mediastinum and the coronary arteries, including calcified atherosclerotic plaque in the left main, left anterior descending, left circumflex and right coronary arteries. Mediastinum/Nodes: No pathologically enlarged mediastinal or hilar lymph nodes. Small hiatal hernia. No axillary lymphadenopathy. Lungs/Pleura: Moderate chronic right pleural effusion with areas of probable rounded atelectasis in the periphery of the right lower and middle lobes, similar to the prior study. Small left pleural effusion is new compared to the prior exam. Assessment of the lung parenchyma is limited by considerable patient respiratory motion. With these limitations in mind there are patchy areas of peribronchovascular ground-glass attenuation in the lungs bilaterally, most evident in the right upper lobe and left lower lobe, concerning for potential bronchopneumonia. Diffuse bronchial wall thickening with mild to moderate centrilobular and paraseptal emphysema. Upper  Abdomen: Aortic atherosclerosis. Musculoskeletal: There are no aggressive appearing lytic or blastic lesions noted in the visualized portions of the skeleton. IMPRESSION: 1. No definitive imaging findings to suggest lymphoma or sarcoidosis. 2. Patchy areas of peribronchovascular ground-glass attenuation in the lungs, most severe in the right upper lobe and left lower lobe concerning for probable bronchopneumonia. 3. Moderate chronic right pleural effusion with areas of rounded atelectasis in the right middle and lower lobes, similar to the prior study. 4. New small left pleural effusion. 5. Diffuse bronchial wall thickening with mild to moderate centrilobular and paraseptal emphysema; imaging findings suggestive of underlying COPD. 6. Aortic atherosclerosis, in addition to left main and 3 vessel coronary artery disease. Aortic Atherosclerosis (ICD10-I70.0) and Emphysema (ICD10-J43.9). Electronically Signed   By: Vinnie Langton M.D.   On: 06/29/2021 12:14     LOS: 3 days   Oren Binet, MD  [facility]    To contact the attending provider between 7A-7P or the covering provider during after hours 7P-7A, please log into the web site [email or web address] and access using universal [place] password for that web site. If you do not have the password, please call the hospital operator.  06/29/2021, 1:36 PM

## 2021-06-30 ENCOUNTER — Other Ambulatory Visit (HOSPITAL_COMMUNITY): Payer: Medicare Other

## 2021-06-30 ENCOUNTER — Inpatient Hospital Stay (HOSPITAL_COMMUNITY): Payer: Medicare Other

## 2021-06-30 DIAGNOSIS — Z7189 Other specified counseling: Secondary | ICD-10-CM

## 2021-06-30 DIAGNOSIS — R531 Weakness: Secondary | ICD-10-CM | POA: Diagnosis not present

## 2021-06-30 DIAGNOSIS — E43 Unspecified severe protein-calorie malnutrition: Secondary | ICD-10-CM | POA: Insufficient documentation

## 2021-06-30 DIAGNOSIS — N179 Acute kidney failure, unspecified: Secondary | ICD-10-CM | POA: Diagnosis not present

## 2021-06-30 DIAGNOSIS — Z515 Encounter for palliative care: Secondary | ICD-10-CM

## 2021-06-30 DIAGNOSIS — R410 Disorientation, unspecified: Secondary | ICD-10-CM | POA: Diagnosis not present

## 2021-06-30 LAB — BASIC METABOLIC PANEL
Anion gap: 9 (ref 5–15)
BUN: 41 mg/dL — ABNORMAL HIGH (ref 8–23)
CO2: 14 mmol/L — ABNORMAL LOW (ref 22–32)
Calcium: 10.9 mg/dL — ABNORMAL HIGH (ref 8.9–10.3)
Chloride: 128 mmol/L — ABNORMAL HIGH (ref 98–111)
Creatinine, Ser: 2.81 mg/dL — ABNORMAL HIGH (ref 0.61–1.24)
GFR, Estimated: 22 mL/min — ABNORMAL LOW (ref >60)
Glucose, Bld: 107 mg/dL — ABNORMAL HIGH (ref 70–99)
Potassium: 3.4 mmol/L — ABNORMAL LOW (ref 3.5–5.1)
Sodium: 151 mmol/L — ABNORMAL HIGH (ref 135–145)

## 2021-06-30 LAB — MAGNESIUM: Magnesium: 2.1 mg/dL (ref 1.7–2.4)

## 2021-06-30 MED ORDER — PANTOPRAZOLE SODIUM 40 MG PO TBEC
40.0000 mg | DELAYED_RELEASE_TABLET | Freq: Every day | ORAL | Status: DC
  Administered 2021-07-02 – 2021-07-04 (×3): 40 mg via ORAL
  Filled 2021-06-30 (×3): qty 1

## 2021-06-30 MED ORDER — SODIUM CHLORIDE 0.45 % IV SOLN: INTRAVENOUS | Status: DC

## 2021-06-30 MED ORDER — DILTIAZEM HCL 25 MG/5ML IV SOLN
10.0000 mg | Freq: Once | INTRAVENOUS | Status: AC
  Administered 2021-06-30: 10 mg via INTRAVENOUS
  Filled 2021-06-30: qty 5

## 2021-06-30 MED ORDER — SODIUM BICARBONATE 650 MG PO TABS
1300.0000 mg | ORAL_TABLET | Freq: Two times a day (BID) | ORAL | Status: DC
Start: 2021-06-30 — End: 2021-07-04
  Administered 2021-06-30 – 2021-07-04 (×7): 1300 mg via ORAL
  Filled 2021-06-30 (×8): qty 2

## 2021-06-30 NOTE — Progress Notes (Signed)
PROGRESS NOTE        PATIENT DETAILS Name: Alfred Mercado Age: 85 y.o. Sex: male Date of Birth: 10-17-1935 Admit Date: 06/26/2021 Admitting Physician Toy Baker, MD TOI:[id number], Rozanna Boer, MD  Brief Narrative: Patient is a 85 y.o. male with history of dementia, PAF on Eliquis, staghorn calculi-who was recently diagnosed with pneumonia at his nursing facility presented with altered mental status-upon further evaluation he was found to have hypercalcemia, AKI.  See below for further details.  Subjective: Confused-pushes me away-occasionally will follow commands.  Per nursing staff-no significant oral intake over the past few days.  Objective: Vitals: Blood pressure 102/66, pulse (!) 109, temperature 97.8 F (36.6 C), temperature source Axillary, resp. rate (!) 21, height _0  (1.854 m), weight 72.6 kg, SpO2 100 %.   Exam: Gen Exam: Confused/restless. HEENT:atraumatic, normocephalic Chest: B/L clear to auscultation anteriorly CVS:S1S2 regular Abdomen: Appears soft-Limited exam as he pushes me away. Extremities:no edema Neurology: Seems to be moving all 4 extremities-limited exam due to lack of cooperation.   Skin: no rash   Pertinent Labs/Radiology: Recent Labs  Lab 06/26/21 1426 06/26/21 1948 06/27/21 0925 06/28/21 0610 06/28/21 0725 06/29/21 0109 06/30/21 0120  WBC 7.5   < > 6.8  --    < > 9.1  --   HGB 9.6*   < > 11.1*  --    < > 10.6*  --   PLT 297   < > 284  --    < > 312  --   NA 136   < > 140 142  --  147* 151*  K 2.7*   < > 3.0* 3.1*  --  3.0* 3.4*  CREATININE 3.11*   < > 2.61* 2.63*  --  2.68* 2.81*  AST 18  --  17 22  --  25  --   ALT 16  --  14 12  --  15  --   ALKPHOS 69  --  65 63  --  57  --   BILITOT 0.3  --  0.7 0.5  --  0.6  --    < > = values in this interval not displayed.   11/1>> PTH: 9 (low) 11/1>> 25 (oh) vitamin D: 63.9 (normal) 11/1>> 1, 2 (OH) vitamin D: 95.6 (high) 11/2>> PTHrP:  Pending  10/31>>Blood culture: No growth 10/31>> urine culture: No growth  10/31>>CXR: Chronic unchanged right-sided pleural effusion. 10/31>> CT head: No acute intracranial pathology, stable left frontotemporal hygroma. 10/31>> CT renal stone study: No acute abnormality, multiple large bilateral renal calculi as previously demonstrated. 11/3>> CT chest: No findings to suggest lymphoma/sarcoidosis, right upper/left lower lobe bronchopneumonia, moderate chronic right pleural effusion.  Underlying COPD.  Assessment/Plan: Acute metabolic encephalopathy: Due to AKI/hypercalcemia-superimposed with dementia at baseline.  No improvement since initial presentation-continue supportive care.  See goals of care discussion below.    AKI on CKD stage IIIb: AKI multifactorial-hemodynamically mediated and from hypercalcemia.  UA without proteinuria, renal CT without hydronephrosis.  Creatinine initially improved with IVF-however seems to have plateaued lately.  Continue to avoid nephrotoxic agents-he has dementia at baseline-now with severe encephalopathy-very frail and in poor overall health-do not think he is a good candidate for RRT.  See goals of care discussion below.    Hypercalcemia: Treated with IVF/Lasix and calcitonin x48 hours.  Calcium levels of improved-but are still elevated.  Have avoided bisphosphonates given AKI for now.  Concerned that patient may have either lymphoma or sarcoidosis given elevation in vitamin D3 levels.  Briefly curb sided nephrology-Dr. Osborne Casco on 11/3-have started patient on prednisone empirically.  We will continue to follow calcium levels-however with his overall poor state of health/frailty/dementia-do not think further work-up including bone marrow biopsy etc. is going to change outcome or management here.  See goals of care discussion below.  Hypernatremia: Due to poor oral intake-start half-normal saline-recheck electrolytes tomorrow.  Encourage oral intake-but limited  due to confusion.  Metabolic acidosis: Likely starvation ketoacidosis and from AKI.  Starting oral bicarb supplementation-follow closely.  Probable aspiration pneumonia: Afebrile-CT chest as above-we will plan on at least 5 days of empiric antimicrobial therapy.  Appreciate SLP eval.  Asymptomatic bacteriuria: UA grossly abnormal-unable to obtain history to see if he has symptoms consistent with UTI (demented/agitated).  Thankfully urine culture negative-in any event already on IV antibiotics.    COVID-19 infection: Per ED notes and H&P-diagnosed 2 weeks ago-does not appear to have any active infection at this point.  No need for isolation.  Normocytic anemia: Suspect has a combination of iron deficiency and anemia of chronic disease at baseline-worsened likely due to IV fluid hydration-required 2 units of PRBC transfusion.  No overt GI bleeding apparent even though FOBT positive.  Resume Eliquis.  Appreciate GI input.    Chronic atrial fibrillation: We will resume Eliquis today.  ?Dementia/cognitive dysfunction at baseline: Confused-expect worsening delirium during this hospital stay.  Goals of care: Unfortunate 85 year old with some amount of baseline dementia-now with severe encephalopathy in the setting of hypercalcemia/AKI/hypernatremia and aspiration pneumonia.  Given elevation in vitamin D3 levels-concerned that he may have underlying lymphoma/sarcoidosis.  He is extremely frail-and is really not a good candidate for aggressive care.  He unfortunately does not have any family-I have spoken with his contact person who is his pastor-Mr. Charles McMillan-x2 today-I have recommended that we change him to a DNR-and watch for clinical improvement over the next few days-if no clinical improvement-I have recommended hospice care.  Mr. Juliane Lack understands the tenuous clinical situation-he however does not wish to make any decisions until he and his spouse have had a chance to visit the patient later  today.  We will touch base with him on 11/5 and continue goals of care discussion.  Palliative care following as well.    Nutrition Status: Nutrition Problem: Severe Malnutrition Etiology: chronic illness (dementia) Signs/Symptoms: percent weight loss, severe fat depletion, severe muscle depletion Percent weight loss: 11.4 % Interventions: Ensure Enlive (each supplement provides 350kcal and 20 grams of protein), MVI    Procedures: None Consults: GI DVT Prophylaxis:  Code Status:Full code  Family Communication: Lillard Anes (contact person/pastor) [phone] [phone] over the phone on 11/4   Time spent: 35 minutes-Greater than 50% of this time was spent in counseling, explanation of diagnosis, planning of further management, and coordination of care.   Disposition Plan: Status is: Inpatient  Remains inpatient appropriate because: Altered mental status not yet at baseline-hypercalcemia/AKI-on IV fluids/calcitonin injections.  Not stable for discharge.   Diet: Diet Order             DIET DYS 2 Room service appropriate? Yes; Fluid consistency: Thin  Diet effective now                     Antimicrobial agents: Anti-infectives (From admission, onward)    Start     Dose/Rate Route Frequency Ordered Stop  06/27/21 1800  azithromycin (ZITHROMAX) 500 mg in sodium chloride 0.9 % 250 mL IVPB        500 mg 250 mL/hr over 60 Minutes Intravenous Every 24 hours 06/26/21 1952 07/02/21 1759   06/27/21 1600  cefTRIAXone (ROCEPHIN) 2 g in sodium chloride 0.9 % 100 mL IVPB        2 g 200 mL/hr over 30 Minutes Intravenous Every 24 hours 06/26/21 1952 07/02/21 1559   06/26/21 1430  cefTRIAXone (ROCEPHIN) 2 g in sodium chloride 0.9 % 100 mL IVPB        2 g 200 mL/hr over 30 Minutes Intravenous  Once 06/26/21 1427 06/26/21 1700   06/26/21 1430  azithromycin (ZITHROMAX) 500 mg in sodium chloride 0.9 % 250 mL IVPB        500 mg 250 mL/hr over 60 Minutes Intravenous  Once 06/26/21 1427  06/26/21 1722        MEDICATIONS: Scheduled Meds:  acetaminophen  1,000 mg Oral TID   apixaban  2.5 mg Oral BID   Chlorhexidine Gluconate Cloth  6 each Topical Daily   feeding supplement  237 mL Oral TID BM   multivitamin with minerals  1 tablet Oral Daily   pantoprazole (PROTONIX) IV  40 mg Intravenous Q12H   predniSONE  40 mg Oral Q breakfast   sodium bicarbonate  1,300 mg Oral BID   tamsulosin  0.4 mg Oral Daily   Continuous Infusions:  sodium chloride 100 mL/hr at 06/30/21 1500   azithromycin Stopped (06/29/21 1915)   cefTRIAXone (ROCEPHIN)  IV 200 mL/hr at 06/30/21 1500   PRN Meds:.albuterol, ondansetron **OR** ondansetron (ZOFRAN) IV   I have personally reviewed following labs and imaging studies  LABORATORY DATA: CBC: Recent Labs  Lab 06/26/21 1426 06/26/21 1948 06/26/21 2028 06/27/21 0441 06/27/21 0925 06/28/21 0725 06/29/21 0109  WBC 7.5 5.4  --  6.6 6.8 9.6 9.1  NEUTROABS 5.7  --   --   --  5.5  --   --   HGB 9.6* 7.3* 6.5* 10.8* 11.1* 11.8* 10.6*  HCT 28.5* 21.7* 19.0* 31.7* 32.3* 34.5* 31.7*  MCV 86.4 86.8  --  86.4 87.3 87.3 87.6  PLT 297 236  --  258 284 287 312     Basic Metabolic Panel: Recent Labs  Lab 06/26/21 1839 06/26/21 1948 06/26/21 2028 06/27/21 0925 06/28/21 0610 06/29/21 0109 06/30/21 0120  NA  --  137 140 140 142 147* 151*  K  --  3.0* 2.5* 3.0* 3.1* 3.0* 3.4*  CL  --  112*  --  116* 118* 125* 128*  CO2  --  20*  --  17* 16* 15* 14*  GLUCOSE  --  102*  --  119* 97 93 107*  BUN  --  42*  --  38* 37* 39* 41*  CREATININE  --  2.69*  --  2.61* 2.63* 2.68* 2.81*  CALCIUM  --  12.3*  --  10.9*  10.7* 10.5* 10.3 10.9*  MG 2.0  --   --  1.8  --  1.6* 2.1  PHOS 4.0  --   --  2.9  --   --   --      GFR: Estimated Creatinine Clearance: 20.5 mL/min (A) (by C-G formula based on SCr of 2.81 mg/dL (H)).  Liver Function Tests: Recent Labs  Lab 06/26/21 1426 06/27/21 0925 06/28/21 0610 06/29/21 0109  AST _0 ALT _1 ALKPHOS 69 65  63 57  BILITOT 0.3 0.7 0.5 0.6  PROT 5.5* 5.1* 5.2* 5.0*  ALBUMIN 2.6* 2.4* 2.4* 2.3*    No results for input(s): LIPASE, AMYLASE in the last 168 hours. Recent Labs  Lab 06/26/21 2030  AMMONIA 21     Coagulation Profile: Recent Labs  Lab 06/26/21 1426  INR 1.5*     Cardiac Enzymes: Recent Labs  Lab 06/26/21 1839  CKTOTAL 8*     BNP (last 3 results) No results for input(s): PROBNP in the last 8760 hours.  Lipid Profile: No results for input(s): CHOL, HDL, LDLCALC, TRIG, CHOLHDL, LDLDIRECT in the last 72 hours.  Thyroid Function Tests: No results for input(s): TSH, T4TOTAL, FREET4, T3FREE, THYROIDAB in the last 72 hours.   Anemia Panel: No results for input(s): VITAMINB12, FOLATE, FERRITIN, TIBC, IRON, RETICCTPCT in the last 72 hours.   Urine analysis:    Component Value Date/Time   COLORURINE STRAW (A) 06/27/2021 0106   APPEARANCEUR CLEAR 06/27/2021 0106   LABSPEC 1.005 06/27/2021 0106   PHURINE 7.0 06/27/2021 0106   GLUCOSEU NEGATIVE 06/27/2021 0106   HGBUR LARGE (A) 06/27/2021 0106   BILIRUBINUR NEGATIVE 06/27/2021 0106   BILIRUBINUR negative 06/27/2020 1140   KETONESUR NEGATIVE 06/27/2021 0106   PROTEINUR NEGATIVE 06/27/2021 0106   UROBILINOGEN 1.0 06/27/2020 1140   NITRITE NEGATIVE 06/27/2021 0106   LEUKOCYTESUR LARGE (A) 06/27/2021 0106    Sepsis Labs: Lactic Acid, Venous    Component Value Date/Time   LATICACIDVEN 1.1 06/26/2021 1815    MICROBIOLOGY: Recent Results (from the past 240 hour(s))  Urine Culture     Status: None   Collection Time: 06/26/21  2:09 PM   Specimen: In/Out Cath Urine  Result Value Ref Range Status   Specimen Description IN/OUT CATH URINE  Final   Special Requests NONE  Final   Culture   Final    NO GROWTH Performed at [facility], [place] [address] [place], [place] [address]    Report Status 06/27/2021 FINAL  Final  Blood Culture (routine x 2)     Status: None (Preliminary  result)   Collection Time: 06/26/21  2:26 PM   Specimen: BLOOD LEFT FOREARM  Result Value Ref Range Status   Specimen Description BLOOD LEFT FOREARM  Final   Special Requests   Final    BOTTLES DRAWN AEROBIC AND ANAEROBIC Blood Culture results may not be optimal due to an excessive volume of blood received in culture bottles   Culture   Final    NO GROWTH 4 DAYS Performed at [facility], [place] [address], [place], [place] [address]    Report Status PENDING  Incomplete  Blood Culture (routine x 2)     Status: None (Preliminary result)   Collection Time: 06/26/21  2:31 PM   Specimen: BLOOD RIGHT FOREARM  Result Value Ref Range Status   Specimen Description BLOOD RIGHT FOREARM  Final   Special Requests   Final    BOTTLES DRAWN AEROBIC AND ANAEROBIC Blood Culture adequate volume   Culture   Final    NO GROWTH 4 DAYS Performed at [facility], [place] [address], [place],  [address]    Report Status PENDING  Incomplete  Resp Panel by RT-PCR (Flu A&B, Covid) Nasopharyngeal Swab     Status: Abnormal   Collection Time: 06/26/21  3:06 PM   Specimen: Nasopharyngeal Swab; Nasopharyngeal(NP) swabs in vial transport medium  Result Value Ref Range Status   SARS Coronavirus 2 by  RT PCR POSITIVE (A) NEGATIVE Final    Comment: RESULT CALLED TO, READ BACK BY AND VERIFIED WITH: Shon Hale RN [id number] 06/26/21 A BROWNING (NOTE) SARS-CoV-2 target nucleic acids are DETECTED.  The SARS-CoV-2 RNA is generally detectable in upper respiratory specimens during the acute phase of infection. Positive results are indicative of the presence of the identified virus, but do not rule out bacterial infection or co-infection with other pathogens not detected by the test. Clinical correlation with patient history and other diagnostic information is necessary to determine patient infection status. The expected result is Negative.  Fact Sheet for  Patients: EntrepreneurPulse.com.au  Fact Sheet for Healthcare Providers: IncredibleEmployment.be  This test is not yet approved or cleared by the [place] FDA and  has been authorized for detection and/or diagnosis of SARS-CoV-2 by FDA under an Emergency Use Authorization (EUA).  This EUA will remain in effect (meaning this test can  be used) for the duration of  the COVID-19 declaration under Section 564(b)(1) of the Act, 21 U.S.C. section 360bbb-3(b)(1), unless the authorization is terminated or revoked sooner.     Influenza A by PCR NEGATIVE NEGATIVE Final   Influenza B by PCR NEGATIVE NEGATIVE Final    Comment: (NOTE) The Xpert Xpress SARS-CoV-2/FLU/RSV plus assay is intended as an aid in the diagnosis of influenza from Nasopharyngeal swab specimens and should not be used as a sole basis for treatment. Nasal washings and aspirates are unacceptable for Xpert Xpress SARS-CoV-2/FLU/RSV testing.  Fact Sheet for Patients: EntrepreneurPulse.com.au  Fact Sheet for Healthcare Providers: IncredibleEmployment.be  This test is not yet approved or cleared by the [place] FDA and has been authorized for detection and/or diagnosis of SARS-CoV-2 by FDA under an Emergency Use Authorization (EUA). This EUA will remain in effect (meaning this test can be used) for the duration of the COVID-19 declaration under Section 564(b)(1) of the Act, 21 U.S.C. section 360bbb-3(b)(1), unless the authorization is terminated or revoked.  Performed at [facility], [place] [address] [place], [place] [address]     RADIOLOGY STUDIES/RESULTS: CT CHEST WO CONTRAST  Result Date: 06/29/2021 CLINICAL DATA:  85 year old male with history of persistent cough. Hypercalcemia. Evaluate for potential lymphoma or sarcoidosis. EXAM: CT CHEST WITHOUT CONTRAST TECHNIQUE: Multidetector CT imaging of the chest was performed following  the standard protocol without IV contrast. COMPARISON:  Chest CT 01/24/2021. FINDINGS: Cardiovascular: Heart size is normal. There is no significant pericardial fluid, thickening or pericardial calcification. There is aortic atherosclerosis, as well as atherosclerosis of the great vessels of the mediastinum and the coronary arteries, including calcified atherosclerotic plaque in the left main, left anterior descending, left circumflex and right coronary arteries. Mediastinum/Nodes: No pathologically enlarged mediastinal or hilar lymph nodes. Small hiatal hernia. No axillary lymphadenopathy. Lungs/Pleura: Moderate chronic right pleural effusion with areas of probable rounded atelectasis in the periphery of the right lower and middle lobes, similar to the prior study. Small left pleural effusion is new compared to the prior exam. Assessment of the lung parenchyma is limited by considerable patient respiratory motion. With these limitations in mind there are patchy areas of peribronchovascular ground-glass attenuation in the lungs bilaterally, most evident in the right upper lobe and left lower lobe, concerning for potential bronchopneumonia. Diffuse bronchial wall thickening with mild to moderate centrilobular and paraseptal emphysema. Upper Abdomen: Aortic atherosclerosis. Musculoskeletal: There are no aggressive appearing lytic or blastic lesions noted in the visualized portions of the skeleton. IMPRESSION: 1. No definitive imaging findings to suggest lymphoma or sarcoidosis.  2. Patchy areas of peribronchovascular ground-glass attenuation in the lungs, most severe in the right upper lobe and left lower lobe concerning for probable bronchopneumonia. 3. Moderate chronic right pleural effusion with areas of rounded atelectasis in the right middle and lower lobes, similar to the prior study. 4. New small left pleural effusion. 5. Diffuse bronchial wall thickening with mild to moderate centrilobular and paraseptal  emphysema; imaging findings suggestive of underlying COPD. 6. Aortic atherosclerosis, in addition to left main and 3 vessel coronary artery disease. Aortic Atherosclerosis (ICD10-I70.0) and Emphysema (ICD10-J43.9). Electronically Signed   By: Vinnie Langton M.D.   On: 06/29/2021 12:14     LOS: 4 days   Oren Binet, MD  [facility]    To contact the attending provider between 7A-7P or the covering provider during after hours 7P-7A, please log into the web site [email or web address] and access using universal [place] password for that web site. If you do not have the password, please call the hospital operator.  06/30/2021, 3:48 PM

## 2021-06-30 NOTE — Progress Notes (Incomplete)
{  Select Note:[id number]} 

## 2021-06-30 NOTE — Progress Notes (Incomplete)
2d echocardiogram attempted but patient was uncoperative; would not let me touch him.  Lavenia Atlas, RCS

## 2021-06-30 NOTE — Progress Notes (Signed)
Daily Progress Note   Patient Name: Alfred Mercado       Date: 06/30/2021 DOB: [birth year]  Age: 85 y.o. MRN#: [id number] Attending Physician: Jonetta Osgood, MD Primary Care Physician: Raymondo Band, MD Admit Date: 06/26/2021  Reason for Consultation/Follow-up: Establishing goals of care  Subjective: Patient denies pain. Attempts to answer my questions but is quite confused and speech difficult to understand. Working with OT - attempting to feed himself inconsistently.   Length of Stay: 4  Current Medications: Scheduled Meds:   acetaminophen  1,000 mg Oral TID   apixaban  2.5 mg Oral BID   Chlorhexidine Gluconate Cloth  6 each Topical Daily   feeding supplement  237 mL Oral TID BM   multivitamin with minerals  1 tablet Oral Daily   pantoprazole (PROTONIX) IV  40 mg Intravenous Q12H   predniSONE  40 mg Oral Q breakfast   sodium bicarbonate  1,300 mg Oral BID   tamsulosin  0.4 mg Oral Daily    Continuous Infusions:  sodium chloride 100 mL/hr at 06/30/21 0737   azithromycin 250 mL/hr at 06/29/21 1900   cefTRIAXone (ROCEPHIN)  IV 200 mL/hr at 06/29/21 1900    PRN Meds: albuterol, ondansetron **OR** ondansetron (ZOFRAN) IV  Physical Exam Constitutional:      General: He is not in acute distress.    Comments: Appears frail Speech garbled   Pulmonary:     Effort: Pulmonary effort is normal. No respiratory distress.  Skin:    General: Skin is warm and dry.  Neurological:     Mental Status: He is alert. He is disoriented.  Psychiatric:        Cognition and Memory: Cognition is impaired. Memory is impaired.        Judgment: Judgment is inappropriate.            Vital Signs: BP 97/66 (BP Location: Right Arm)   Pulse 99   Temp 98 F (36.7 C) (Axillary)   Resp (!) 23   Ht 6'  1" (1.854 m)   Wt 72.6 kg   SpO2 100%   BMI 21.11 kg/m  SpO2: SpO2: 100 % O2 Device: O2 Device: Room Air O2 Flow Rate: O2 Flow Rate (L/min): 2 L/min  Intake/output summary:  Intake/Output Summary (Last 24 hours) at 06/30/2021 1055 Last data filed at 06/30/2021 [id number] Gross per 24 hour  Intake 1934.28 ml  Output 3350 ml  Net -1415.72 ml   LBM: Last BM Date: 06/28/21 Baseline Weight: Weight: 72.6 kg Most recent weight: Weight: 72.6 kg       Palliative Assessment/Data: PPS 30%    Flowsheet Rows    Flowsheet Row Most Recent Value  Intake Tab   Referral Department Hospitalist  Unit at Time of Referral ER  Palliative Care Primary Diagnosis Sepsis/Infectious Disease  Date Notified 06/26/21  Palliative Care Type New Palliative care  Reason for referral Clarify Goals of Care  Date of Admission 06/26/21  Date first seen by Palliative Care 06/28/21  # of days Palliative referral response time 2 Day(s)  # of days IP prior to Palliative referral 0  Clinical Assessment   Psychosocial & Spiritual Assessment   Palliative Care Outcomes  Patient Active Problem List   Diagnosis Date Noted   Protein-calorie malnutrition, severe 06/30/2021   Delirium    CAP (community acquired pneumonia) 06/26/2021   COVID-19 virus infection 06/26/2021   Occult blood positive stool 06/26/2021   Hypercalcemia 06/26/2021   Elevated troponin 06/26/2021   Malnutrition of moderate degree 03/14/2021   Patient incapable of making informed decisions    Somnolence    Sepsis (HCC) 03/02/2021   Cellulitis 03/02/2021   Hyponatremia 03/02/2021   Renal failure    Hypotension 02/09/2021   Falls 02/09/2021   Syncope and collapse 02/09/2021   Severe sepsis (HCC) 01/25/2021   Iron deficiency anemia 01/25/2021   Generalized weakness    Acute-on-chronic kidney injury (HCC) 01/24/2021   Complicated UTI (urinary tract infection) 01/24/2021   Renal mass, left 01/24/2021   Staghorn renal calculus  01/24/2021   Microscopic hematuria 01/24/2021   Aortic aneurysm (HCC) 01/24/2021   Closed burst fracture of lumbar vertebra (HCC) 01/24/2021   Acute respiratory failure with hypoxia (HCC) 08/13/2020   UTI (urinary tract infection) 06/02/2020   Community acquired pneumonia of right lower lobe of lung 06/02/2020   Acute hypoxemic respiratory failure (HCC) 06/02/2020   Hypokalemia 06/02/2020   Acute diastolic CHF (congestive heart failure) (HCC) 02/06/2020   Exertional shortness of breath 10/14/2019   Atrial fibrillation with RVR (HCC) 10/14/2019   Pleural effusion    Chronic heart failure with preserved ejection fraction [facility])     Palliative Care Assessment & Plan   HPI: 85 y.o. male  with past medical history of dementia, PAF on Eliquis, heart failure, pleural effusions, COPD, CKD. and staghorn calculi admitted on 06/26/2021 with AMS. Recently diagnosed with pneumonia at his SNF. Also have to have AKI and hypercalcemia. Hypercalcemia continues despite treatment and vitamin d3 also elevated.  CT chest/abdomen without any signs of lymphadenopathy. Diagnosed with Covid-19 2 weeks ago. No active infection now. PMT consulted to discuss GOC.   Assessment: Received call back today from patient's pastor/friend Sherri Sear.  [name] tells me he is the patient's pastor - he used to visit him where he lived prior to SNF. We discuss that he is our only contact for the patient and discuss his role as medical decision maker - he Is comfortable with this role and tells me his wife is LCSW and he plans to involve her in decision making.   [name] shares he is aware patient has kidney issues and has had a decline in function. He also speaks of patient's frailty.  I ask him if the patient had ever shared medical wishes with him in the past and he tells me this is something they have talked about some but no specifics.  We review situation - review ongoing encephalopathy, hypercalcemia, and  AKI. Review concern that some other process going on in patient that could lead to poor prognosis. We review that patient is not a good candidate for aggressive medical therapy. Encouraged [name] to consider DNR/DNI status understanding evidenced based poor outcomes in similar hospitalized patients, as the cause of the arrest is likely associated with chronic/terminal disease rather than a reversible acute cardio-pulmonary event. We also discussed transitioning care to a comfort focused approach instead of ongoing aggressive medical care and work up.   [name] tells me he would like to come see patient and bring his wife to see patient prior to making any decisions. He plans to visit the patient tomorrow and will plan to speak with patient's physician at  that time.  Recommendations/Plan: Decision maker plans to visit tomorrow and discuss situation with patient's hospitalist prior to making decisions - DNR and considering comfort focused measures has been recommended to decision maker Would be good candidate to return to SNF with hospice in place if decision maker agrees PMT will be available as needed but at this point decision maker would like to speak to hospitalist 11/5 then make decisions  Discharge Planning: [facility]? Could be good option if decision maker agrees  Care plan was discussed with Dr. Sloan Leiter and patient's friend/pastor Lillard Anes  Thank you for allowing the Palliative Medicine Team to assist in the care of this patient.   Total Time 40 minutes Prolonged Time Billed  no       Greater than 50%  of this time was spent counseling and coordinating care related to the above assessment and plan.  Juel Burrow, DNP, [facility] Palliative Medicine Team Team Phone # [phone]  Pager [phone]

## 2021-06-30 NOTE — Progress Notes (Signed)
Echocardiogram unable to be completed due to patients mental status. Patient states he doesn't want to be touched, when touched he becomes agitated.   Leta Jungling RDCS

## 2021-06-30 NOTE — Progress Notes (Signed)
Occupational Therapy Treatment Patient Details Name: Alfred Mercado MRN: [id number] DOB: May 18, 1936 Today's Date: 06/30/2021   History of present illness Pt is a 85 yr old male who presented to [facility] hospital on 06/26/2021 due to acute encephalopathy. Of note pt also with recent diagnosis of COVID-19 and PNA. PMH of sepsis, diastolic CHF, afib, dementia, COVID, CKD.   OT comments  Patient seen by skilled OT to address self feeding and grooming. Patient's mitts were removed to allow for self feeding. Patient required hand over hand to feed self and on occasion was able to manage utensil. Patient had difficulty following directions for grooming.  When applying mitts patient was verbally agitated.Acute OT to continue to follow.    Recommendations for follow up therapy are one component of a multi-disciplinary discharge planning process, led by the attending physician.  Recommendations may be updated based on patient status, additional functional criteria and insurance authorization.    Follow Up Recommendations  Skilled nursing-short term rehab (<3 hours/day)    Assistance Recommended at Discharge Frequent or constant Supervision/Assistance  Equipment Recommendations  None recommended by OT    Recommendations for Other Services      Precautions / Restrictions Precautions Precautions: Fall Precaution Comments: monitor HR, tachy, afib Restrictions Weight Bearing Restrictions: No       Mobility Bed Mobility                    Transfers                         Balance                                           ADL either performed or assessed with clinical judgement   ADL Overall ADL's : Needs assistance/impaired Eating/Feeding: Maximal assistance;Cueing for safety;Cueing for sequencing;Sitting Eating/Feeding Details (indicate cue type and reason): performed at bed level with head of bed raised. Patient required hand over hand to min  assist Grooming: Maximal assistance;Wash/dry hands;Wash/dry face                                 General ADL Comments: patient seen at bed level with varying degree of participation     Vision       Perception     Praxis      Cognition Arousal/Alertness: Awake/alert Behavior During Therapy: Anxious;Agitated Overall Cognitive Status: Difficult to assess                                 General Comments: inconsistant on following directions for self feeding          Exercises     Shoulder Instructions       General Comments      Pertinent Vitals/ Pain       Pain Assessment: No/denies pain Faces Pain Scale: No hurt  Home Living                                          Prior Functioning/Environment              Frequency  Min 2X/week  Progress Toward Goals  OT Goals(current goals can now be found in the care plan section)  Progress towards OT goals: Progressing toward goals  Acute Rehab OT Goals Patient Stated Goal: non stated OT Goal Formulation: With patient Time For Goal Achievement: 07/22/21 Potential to Achieve Goals: Fair ADL Goals Pt Will Perform Eating: with set-up;sitting Pt Will Perform Grooming: with min assist;sitting Pt Will Perform Upper Body Bathing: with mod assist;sitting Pt Will Perform Lower Body Bathing: with max assist;sit to/from stand  Plan Discharge plan remains appropriate    Co-evaluation                 AM-PAC OT "6 Clicks" Daily Activity     Outcome Measure   Help from another person eating meals?: Total Help from another person taking care of personal grooming?: Total Help from another person toileting, which includes using toliet, bedpan, or urinal?: Total Help from another person bathing (including washing, rinsing, drying)?: Total Help from another person to put on and taking off regular upper body clothing?: Total Help from another person to put on  and taking off regular lower body clothing?: Total 6 Click Score: 6    End of Session    OT Visit Diagnosis: Unsteadiness on feet (R26.81);Other abnormalities of gait and mobility (R26.89);Repeated falls (R29.6);Muscle weakness (generalized) (M62.81);Pain   Activity Tolerance Treatment limited secondary to agitation   Patient Left in bed;with call bell/phone within reach;with bed alarm set   Nurse Communication Other (comment) (on self feeding)        Time: 5300-5110 OT Time Calculation (min): 18 min  Charges: OT General Charges $OT Visit: 1 Visit OT Treatments $Self Care/Home Management : 8-22 mins Alfonse Flavors, OTA Acute Rehabilitation Services  Pager [phone] Office [phone]   Dewain Penning 06/30/2021, 9:24 AM

## 2021-07-01 ENCOUNTER — Inpatient Hospital Stay (HOSPITAL_COMMUNITY): Payer: Medicare Other

## 2021-07-01 DIAGNOSIS — N179 Acute kidney failure, unspecified: Secondary | ICD-10-CM | POA: Diagnosis not present

## 2021-07-01 DIAGNOSIS — E876 Hypokalemia: Secondary | ICD-10-CM | POA: Diagnosis not present

## 2021-07-01 LAB — COMPREHENSIVE METABOLIC PANEL
ALT: 20 U/L (ref 0–44)
AST: 23 U/L (ref 15–41)
Albumin: 2.3 g/dL — ABNORMAL LOW (ref 3.5–5.0)
Alkaline Phosphatase: 65 U/L (ref 38–126)
Anion gap: 7 (ref 5–15)
BUN: 45 mg/dL — ABNORMAL HIGH (ref 8–23)
CO2: 14 mmol/L — ABNORMAL LOW (ref 22–32)
Calcium: 10.9 mg/dL — ABNORMAL HIGH (ref 8.9–10.3)
Chloride: 128 mmol/L — ABNORMAL HIGH (ref 98–111)
Creatinine, Ser: 2.68 mg/dL — ABNORMAL HIGH (ref 0.61–1.24)
GFR, Estimated: 23 mL/min — ABNORMAL LOW (ref >60)
Glucose, Bld: 111 mg/dL — ABNORMAL HIGH (ref 70–99)
Potassium: 2.7 mmol/L — CL (ref 3.5–5.1)
Sodium: 149 mmol/L — ABNORMAL HIGH (ref 135–145)
Total Bilirubin: 0.9 mg/dL (ref 0.3–1.2)
Total Protein: 5.1 g/dL — ABNORMAL LOW (ref 6.5–8.1)

## 2021-07-01 LAB — CULTURE, BLOOD (ROUTINE X 2)
Culture: NO GROWTH
Culture: NO GROWTH
Special Requests: ADEQUATE

## 2021-07-01 LAB — MAGNESIUM: Magnesium: 1.8 mg/dL (ref 1.7–2.4)

## 2021-07-01 MED ORDER — DILTIAZEM HCL 25 MG/5ML IV SOLN
10.0000 mg | Freq: Once | INTRAVENOUS | Status: AC
  Administered 2021-07-01: 10 mg via INTRAVENOUS
  Filled 2021-07-01: qty 5

## 2021-07-01 MED ORDER — MAGNESIUM SULFATE IN D5W 1-5 GM/100ML-% IV SOLN
1.0000 g | Freq: Once | INTRAVENOUS | Status: AC
  Administered 2021-07-01: 1 g via INTRAVENOUS
  Filled 2021-07-01: qty 100

## 2021-07-01 MED ORDER — POTASSIUM CHLORIDE 2 MEQ/ML IV SOLN: INTRAVENOUS | Status: DC

## 2021-07-01 MED ORDER — POTASSIUM CHLORIDE CRYS ER 20 MEQ PO TBCR
40.0000 meq | EXTENDED_RELEASE_TABLET | Freq: Once | ORAL | Status: AC
  Administered 2021-07-01: 40 meq via ORAL
  Filled 2021-07-01: qty 2

## 2021-07-01 MED ORDER — POTASSIUM CHLORIDE 10 MEQ/100ML IV SOLN
10.0000 meq | INTRAVENOUS | Status: AC
  Administered 2021-07-01 (×3): 10 meq via INTRAVENOUS
  Filled 2021-07-01 (×2): qty 100

## 2021-07-01 MED ORDER — HALOPERIDOL LACTATE 5 MG/ML IJ SOLN
2.0000 mg | Freq: Four times a day (QID) | INTRAMUSCULAR | Status: DC | PRN
  Administered 2021-07-01 – 2021-07-04 (×3): 2 mg via INTRAVENOUS
  Filled 2021-07-01 (×4): qty 1

## 2021-07-01 MED ORDER — LACTATED RINGERS IV BOLUS
500.0000 mL | Freq: Once | INTRAVENOUS | Status: AC
  Administered 2021-07-01: 500 mL via INTRAVENOUS

## 2021-07-01 MED ORDER — POTASSIUM CHLORIDE CRYS ER 20 MEQ PO TBCR: 40.0000 meq | EXTENDED_RELEASE_TABLET | Freq: Two times a day (BID) | ORAL | Status: DC

## 2021-07-01 MED ORDER — HALOPERIDOL LACTATE 5 MG/ML IJ SOLN
2.0000 mg | Freq: Once | INTRAMUSCULAR | Status: AC
  Administered 2021-07-01: 2 mg via INTRAVENOUS
  Filled 2021-07-01: qty 1

## 2021-07-01 MED ORDER — METOPROLOL TARTRATE 5 MG/5ML IV SOLN
5.0000 mg | Freq: Three times a day (TID) | INTRAVENOUS | Status: DC | PRN
  Administered 2021-07-02: 5 mg via INTRAVENOUS
  Filled 2021-07-01: qty 5

## 2021-07-01 MED ORDER — LACTATED RINGERS IV BOLUS
1000.0000 mL | Freq: Once | INTRAVENOUS | Status: AC
  Administered 2021-07-01: 1000 mL via INTRAVENOUS

## 2021-07-01 MED ORDER — ZOLEDRONIC ACID 4 MG/5ML IV CONC
4.0000 mg | Freq: Once | INTRAVENOUS | Status: AC
  Administered 2021-07-01: 4 mg via INTRAVENOUS
  Filled 2021-07-01: qty 5

## 2021-07-01 MED ORDER — POTASSIUM CHLORIDE 2 MEQ/ML IV SOLN
INTRAVENOUS | Status: DC
  Filled 2021-07-01 (×2): qty 1000

## 2021-07-01 NOTE — Progress Notes (Signed)
PROGRESS NOTE        PATIENT DETAILS Name: Alfred Mercado Age: 85 y.o. Sex: male Date of Birth: 30-Jul-1936 Admit Date: 06/26/2021 Admitting Physician Toy Baker, MD [id number], Rozanna Boer, MD  Brief Narrative: Patient is a 85 y.o. male with history of dementia, PAF on Eliquis, staghorn calculi-who was recently diagnosed with pneumonia at his nursing facility presented with altered mental status-upon further evaluation he was found to have hypercalcemia, AKI.  See below for further details.   Subjective:  patient in bed, confused, denies headache or chest pain.    Objective: Vitals: Blood pressure 115/65, pulse (!) 135, temperature 98.2 F (36.8 C), temperature source Axillary, resp. rate 20, height 6\' 1"  (1.854 m), weight 67.6 kg, SpO2 95 %.   Exam:  Awake extremely confused, moving all 4 extremities [place].AT,PERRAL Supple Neck, No JVD,   Symmetrical Chest wall movement, Good air movement bilaterally, CTAB RRR,No Gallops, Rubs or new Murmurs,  +ve B.Sounds, Abd Soft, No tenderness,   No Cyanosis, Clubbing or edema    Pertinent Labs/Radiology: Recent Labs  Lab 06/26/21 1426 06/26/21 1948 06/27/21 0925 06/28/21 0610 06/28/21 0725 06/29/21 0109 06/30/21 0120 07/01/21 0341  WBC 7.5   < > 6.8  --    < > 9.1  --   --   HGB 9.6*   < > 11.1*  --    < > 10.6*  --   --   PLT 297   < > 284  --    < > 312  --   --   NA 136   < > 140 142  --  147*   < > 149*  K 2.7*   < > 3.0* 3.1*  --  3.0*   < > 2.7*  CREATININE 3.11*   < > 2.61* 2.63*  --  2.68*   < > 2.68*  AST 18  --  17 22  --  25  --  23  ALT 16  --  14 12  --  15  --  20  ALKPHOS 69  --  65 63  --  57  --  65  BILITOT 0.3  --  0.7 0.5  --  0.6  --  0.9   < > = values in this interval not displayed.   11/1>> PTH: 9 (low) 11/1>> 25 (oh) vitamin D: 63.9 (normal) 11/1>> 1, 2 (OH) vitamin D: 95.6 (high) 11/2>> PTHrP: Pending  10/31>>Blood culture: No growth 10/31>> urine culture: No  growth  10/31>>CXR: Chronic unchanged right-sided pleural effusion. 10/31>> CT head: No acute intracranial pathology, stable left frontotemporal hygroma. 10/31>> CT renal stone study: No acute abnormality, multiple large bilateral renal calculi as previously demonstrated. 11/3>> CT chest: No findings to suggest lymphoma/sarcoidosis, right upper/left lower lobe bronchopneumonia, moderate chronic right pleural effusion.  Underlying COPD.   Assessment/Plan:  Acute metabolic encephalopathy: Due to AKI/hypercalcemia-superimposed with dementia at baseline.  CT head nonacute, moving all 4 extremities without any focal deficits, continue treatment for hypercalcemia.  AKI on CKD stage IIIb: AKI multifactorial-hemodynamically mediated and from hypercalcemia.  UA without proteinuria, renal CT without hydronephrosis.  Continue treatment for hypercalcemia.  Hypercalcemia: Is unclear but likely due to underlying malignancy or sarcoid, ACE level and PTH RP levels are pending, treat with aggressive IV fluids with hydration, has already received Sulman calcitonin will proceed with dose  of Zometa.  Will monitor closely.  Hypernatremia: Due to extreme dehydration IV D5W.  Metabolic acidosis: Likely starvation ketoacidosis and from AKI.  Starting oral bicarb supplementation-follow closely.  Probable aspiration pneumonia: Afebrile-CT chest as above-we will plan on at least 5 days of empiric antimicrobial therapy.  Appreciate SLP eval.  Asymptomatic bacteriuria: UA grossly abnormal-unable to obtain history to see if he has symptoms consistent with UTI (demented/agitated).  Thankfully urine culture negative-in any event already on IV antibiotics.    COVID-19 infection: Per ED notes and H&P-diagnosed 2 weeks ago-does not appear to have any active infection at this point.  No need for isolation.  Normocytic anemia: Suspect has a combination of iron deficiency and anemia of chronic disease at baseline-worsened  likely due to IV fluid hydration-required 2 units of PRBC transfusion.  No overt GI bleeding apparent even though FOBT positive.  Resume Eliquis.  Appreciate GI input.    Chronic atrial fibrillation: We will resume Eliquis today.  ?Dementia/cognitive dysfunction at baseline: Confused-expect worsening delirium during this hospital stay.  Goals of care: Unfortunate 85 year old with some amount of baseline dementia-now with severe encephalopathy in the setting of hypercalcemia/AKI/hypernatremia and aspiration pneumonia.  Given elevation in vitamin D3 levels-concerned that he may have underlying lymphoma/sarcoidosis.  He is extremely frail-and is really not a good candidate for aggressive care.  He unfortunately does not have any family-I have spoken with his contact person who is his pastor-Mr. Charles McMillan-x2 today-previous MD recommended that we change him to a DNR-and watch for clinical improvement over the next few days-if no clinical improvement-I have recommended hospice care.  Mr. Greer Pickerel understands the tenuous clinical situation-he however does not wish to make any decisions until he and his spouse have had a chance to visit the patient later today.    Left a message for POA on 07/01/2021 response.  Palliative care following.  Nutrition Status: Nutrition Problem: Severe Malnutrition Etiology: chronic illness (dementia) Signs/Symptoms: percent weight loss, severe fat depletion, severe muscle depletion Percent weight loss: 11.4 % Interventions: Ensure Enlive (each supplement provides 350kcal and 20 grams of protein), MVI    Procedures: None Consults: GI DVT Prophylaxis:  Code Status:Full code  Family Communication: Sherri Sear (contact person/pastor) [phone] [phone] over the phone on 06/30/21, left message 07/01/21 at 1.05 pm  Time spent: 35 minutes-Greater than 50% of this time was spent in counseling, explanation of diagnosis, planning of further management, and coordination of  care.   Disposition Plan: Status is: Inpatient  Remains inpatient appropriate because: Altered mental status not yet at baseline-hypercalcemia/AKI-on IV fluids/calcitonin injections.  Not stable for discharge.   Diet: Diet Order             DIET DYS 2 Room service appropriate? Yes; Fluid consistency: Thin  Diet effective now                     Antimicrobial agents: Anti-infectives (From admission, onward)    Start     Dose/Rate Route Frequency Ordered Stop   06/27/21 1800  azithromycin (ZITHROMAX) 500 mg in sodium chloride 0.9 % 250 mL IVPB        500 mg 250 mL/hr over 60 Minutes Intravenous Every 24 hours 06/26/21 1952 07/02/21 1759   06/27/21 1600  cefTRIAXone (ROCEPHIN) 2 g in sodium chloride 0.9 % 100 mL IVPB        2 g 200 mL/hr over 30 Minutes Intravenous Every 24 hours 06/26/21 1952 07/02/21 1559   06/26/21 1430  cefTRIAXone (  ROCEPHIN) 2 g in sodium chloride 0.9 % 100 mL IVPB        2 g 200 mL/hr over 30 Minutes Intravenous  Once 06/26/21 1427 06/26/21 1700   06/26/21 1430  azithromycin (ZITHROMAX) 500 mg in sodium chloride 0.9 % 250 mL IVPB        500 mg 250 mL/hr over 60 Minutes Intravenous  Once 06/26/21 1427 06/26/21 1722        MEDICATIONS: Scheduled Meds:  acetaminophen  1,000 mg Oral TID   apixaban  2.5 mg Oral BID   Chlorhexidine Gluconate Cloth  6 each Topical Daily   diltiazem  10 mg Intravenous Once   feeding supplement  237 mL Oral TID BM   multivitamin with minerals  1 tablet Oral Daily   pantoprazole  40 mg Oral Q1200   predniSONE  40 mg Oral Q breakfast   sodium bicarbonate  1,300 mg Oral BID   tamsulosin  0.4 mg Oral Daily   Continuous Infusions:  azithromycin 500 mg (06/30/21 1830)   cefTRIAXone (ROCEPHIN)  IV 2 g (06/30/21 1759)   dextrose 5 % with kcl     lactated ringers     magnesium sulfate bolus IVPB     zoledronic acid (ZOMETA) IV     PRN Meds:.albuterol, haloperidol lactate, metoprolol tartrate, ondansetron **OR**  ondansetron (ZOFRAN) IV   I have personally reviewed following labs and imaging studies  LABORATORY DATA:  Recent Labs  Lab 06/26/21 1426 06/26/21 1948 06/26/21 2028 06/27/21 0441 06/27/21 0925 06/28/21 0725 06/29/21 0109  WBC 7.5 5.4  --  6.6 6.8 9.6 9.1  HGB 9.6* 7.3* 6.5* 10.8* 11.1* 11.8* 10.6*  HCT 28.5* 21.7* 19.0* 31.7* 32.3* 34.5* 31.7*  PLT 297 236  --  258 284 287 312  MCV 86.4 86.8  --  86.4 87.3 87.3 87.6  MCH 29.1 29.2  --  29.4 30.0 29.9 29.3  MCHC 33.7 33.6  --  34.1 34.4 34.2 33.4  RDW 16.1* 16.1*  --  15.8* 15.9* 16.1* 16.3*  LYMPHSABS 0.9  --   --   --  0.7  --   --   MONOABS 0.8  --   --   --  0.5  --   --   EOSABS 0.1  --   --   --  0.0  --   --   BASOSABS 0.1  --   --   --  0.0  --   --     Recent Labs  Lab 06/26/21 1426 06/26/21 1431 06/26/21 1815 06/26/21 1839 06/26/21 1948 06/26/21 2030 06/27/21 0441 06/27/21 0925 06/28/21 0610 06/29/21 0109 06/30/21 0120 07/01/21 0341  NA 136  --   --   --    < >  --   --  140 142 147* 151* 149*  K 2.7*  --   --   --    < >  --   --  3.0* 3.1* 3.0* 3.4* 2.7*  CL 110  --   --   --    < >  --   --  116* 118* 125* 128* 128*  CO2 17*  --   --   --    < >  --   --  17* 16* 15* 14* 14*  GLUCOSE 156*  --   --   --    < >  --   --  119* 97 93 107* 111*  BUN 47*  --   --   --    < >  --   --  38* 37* 39* 41* 45*  CREATININE 3.11*  --   --   --    < >  --   --  2.61* 2.63* 2.68* 2.81* 2.68*  CALCIUM 12.7*  --   --   --    < >  --   --  10.9*  10.7* 10.5* 10.3 10.9* 10.9*  AST 18  --   --   --   --   --   --  17 22 25   --  23  ALT 16  --   --   --   --   --   --  14 12 15   --  20  ALKPHOS 69  --   --   --   --   --   --  65 63 57  --  65  BILITOT 0.3  --   --   --   --   --   --  0.7 0.5 0.6  --  0.9  ALBUMIN 2.6*  --   --   --   --   --   --  2.4* 2.4* 2.3*  --  2.3*  MG  --   --   --  2.0  --   --   --  1.8  --  1.6* 2.1 1.8  PROCALCITON  --   --   --  <0.10  --   --   --   --   --   --   --   --    LATICACIDVEN  --  2.0* 1.1  --   --   --   --   --   --   --   --   --   INR 1.5*  --   --   --   --   --   --   --   --   --   --   --   TSH  --   --   --   --   --   --  0.590  --   --   --   --   --   HGBA1C  --   --   --   --   --   --  5.5  --   --   --   --   --   AMMONIA  --   --   --   --   --  21  --   --   --   --   --   --    < > = values in this interval not displayed.         RADIOLOGY STUDIES/RESULTS: No results found.   LOS: 5 days   Signature  Lala Lund M.D on 07/01/2021 at 1:11 PM   -  To page go to [email or web address]

## 2021-07-01 NOTE — Progress Notes (Signed)
TR[facility] night cross cover note:  I was notified by RN serum potassium level this morning is 2.7.  Per chart review, this is compared to a serum potassium level 3.0 yesterday morning.  It appears that this hospitalization has been complicated by acute kidney injury superimposed on stage IIIb chronic kidney disease, with this morning's serum creatinine noted to be 2.68, representing slight interval improvement relative to 2.81 yesterday morning.  In the setting of AKI on CKD 3B, will be slightly more conservative with potassium supplementation efforts in order to reduce risk for ensuing development of hyperkalemia.  Consequently, I have ordered potassium chloride 40 mEq p.o. x1 dose now.    Newton Pigg, DO Hospitalist

## 2021-07-01 NOTE — Progress Notes (Signed)
Patient is very agitated to put in the PIV access. Patient's BP is stable at this time. Talked patient's RN regarding this. No 2nd PIV access at this time, patient's RN will consult, if needs. HS [employer]

## 2021-07-02 DIAGNOSIS — N179 Acute kidney failure, unspecified: Secondary | ICD-10-CM | POA: Diagnosis not present

## 2021-07-02 DIAGNOSIS — E876 Hypokalemia: Secondary | ICD-10-CM | POA: Diagnosis not present

## 2021-07-02 LAB — CBC WITH DIFFERENTIAL/PLATELET
Abs Immature Granulocytes: 0.1 10*3/uL — ABNORMAL HIGH (ref 0.00–0.07)
Basophils Absolute: 0 10*3/uL (ref 0.0–0.1)
Basophils Relative: 0 %
Eosinophils Absolute: 0.1 10*3/uL (ref 0.0–0.5)
Eosinophils Relative: 1 %
HCT: 31.5 % — ABNORMAL LOW (ref 39.0–52.0)
Hemoglobin: 10.2 g/dL — ABNORMAL LOW (ref 13.0–17.0)
Immature Granulocytes: 1 %
Lymphocytes Relative: 8 %
Lymphs Abs: 0.7 10*3/uL (ref 0.7–4.0)
MCH: 29.1 pg (ref 26.0–34.0)
MCHC: 32.4 g/dL (ref 30.0–36.0)
MCV: 90 fL (ref 80.0–100.0)
Monocytes Absolute: 0.6 10*3/uL (ref 0.1–1.0)
Monocytes Relative: 6 %
Neutro Abs: 7.5 10*3/uL (ref 1.7–7.7)
Neutrophils Relative %: 84 %
Platelets: 266 10*3/uL (ref 150–400)
RBC: 3.5 MIL/uL — ABNORMAL LOW (ref 4.22–5.81)
RDW: 17.7 % — ABNORMAL HIGH (ref 11.5–15.5)
WBC: 9 10*3/uL (ref 4.0–10.5)
nRBC: 0 % (ref 0.0–0.2)

## 2021-07-02 LAB — COMPREHENSIVE METABOLIC PANEL
ALT: 17 U/L (ref 0–44)
AST: 29 U/L (ref 15–41)
Albumin: 2.2 g/dL — ABNORMAL LOW (ref 3.5–5.0)
Alkaline Phosphatase: 54 U/L (ref 38–126)
Anion gap: 7 (ref 5–15)
BUN: 42 mg/dL — ABNORMAL HIGH (ref 8–23)
CO2: 14 mmol/L — ABNORMAL LOW (ref 22–32)
Calcium: 10.5 mg/dL — ABNORMAL HIGH (ref 8.9–10.3)
Chloride: 127 mmol/L — ABNORMAL HIGH (ref 98–111)
Creatinine, Ser: 2.73 mg/dL — ABNORMAL HIGH (ref 0.61–1.24)
GFR, Estimated: 22 mL/min — ABNORMAL LOW (ref >60)
Glucose, Bld: 113 mg/dL — ABNORMAL HIGH (ref 70–99)
Potassium: 3.4 mmol/L — ABNORMAL LOW (ref 3.5–5.1)
Sodium: 148 mmol/L — ABNORMAL HIGH (ref 135–145)
Total Bilirubin: 0.2 mg/dL — ABNORMAL LOW (ref 0.3–1.2)
Total Protein: 4.7 g/dL — ABNORMAL LOW (ref 6.5–8.1)

## 2021-07-02 LAB — BRAIN NATRIURETIC PEPTIDE: B Natriuretic Peptide: 670.1 pg/mL — ABNORMAL HIGH (ref 0.0–100.0)

## 2021-07-02 LAB — MAGNESIUM: Magnesium: 1.8 mg/dL (ref 1.7–2.4)

## 2021-07-02 MED ORDER — DILTIAZEM HCL 25 MG/5ML IV SOLN
10.0000 mg | INTRAVENOUS | Status: DC | PRN
  Administered 2021-07-02 – 2021-07-04 (×2): 10 mg via INTRAVENOUS
  Filled 2021-07-02 (×4): qty 5

## 2021-07-02 MED ORDER — DILTIAZEM HCL 25 MG/5ML IV SOLN
10.0000 mg | Freq: Four times a day (QID) | INTRAVENOUS | Status: DC | PRN
  Filled 2021-07-02: qty 5

## 2021-07-02 MED ORDER — LACTATED RINGERS IV BOLUS
1000.0000 mL | Freq: Once | INTRAVENOUS | Status: AC
  Administered 2021-07-02: 1000 mL via INTRAVENOUS

## 2021-07-02 MED ORDER — DEXAMETHASONE SODIUM PHOSPHATE 10 MG/ML IJ SOLN
6.0000 mg | INTRAMUSCULAR | Status: DC
  Administered 2021-07-02 – 2021-07-04 (×3): 6 mg via INTRAVENOUS
  Filled 2021-07-02 (×3): qty 1

## 2021-07-02 MED ORDER — LACTATED RINGERS IV SOLN: INTRAVENOUS | Status: DC

## 2021-07-02 MED ORDER — METOPROLOL TARTRATE 5 MG/5ML IV SOLN
5.0000 mg | INTRAVENOUS | Status: DC | PRN
  Filled 2021-07-02: qty 5

## 2021-07-02 MED ORDER — CHLORDIAZEPOXIDE HCL 25 MG PO CAPS: 25.0000 mg | ORAL_CAPSULE | Freq: Once | ORAL | Status: DC

## 2021-07-02 MED ORDER — DILTIAZEM HCL 25 MG/5ML IV SOLN
10.0000 mg | Freq: Once | INTRAVENOUS | Status: AC
  Administered 2021-07-02: 10 mg via INTRAVENOUS
  Filled 2021-07-02: qty 5

## 2021-07-02 MED ORDER — LACTATED RINGERS IV BOLUS
500.0000 mL | Freq: Once | INTRAVENOUS | Status: AC
  Administered 2021-07-02: 500 mL via INTRAVENOUS

## 2021-07-02 MED ORDER — METOPROLOL TARTRATE 5 MG/5ML IV SOLN
INTRAVENOUS | Status: AC
  Filled 2021-07-02: qty 5

## 2021-07-02 MED ORDER — POTASSIUM CHLORIDE 2 MEQ/ML IV SOLN
INTRAVENOUS | Status: DC
  Filled 2021-07-02 (×2): qty 1000

## 2021-07-02 MED ORDER — DILTIAZEM HCL 60 MG PO TABS
60.0000 mg | ORAL_TABLET | Freq: Three times a day (TID) | ORAL | Status: DC
  Administered 2021-07-02 – 2021-07-03 (×3): 60 mg via ORAL
  Filled 2021-07-02 (×3): qty 1

## 2021-07-02 NOTE — Progress Notes (Signed)
Patient extremely agitated. Throwing legs off of side of bed and pulling at medical devices after managing to remove safety mittens. PRN haldol administered as ordered. QT interval 437

## 2021-07-02 NOTE — Progress Notes (Signed)
TR[facility] night cross cover note:  Has chronic a fib. HR's 100-120's over last 12 hours. Now 130's. SBP stable, unchanged. No acute symptoms put very limited historian w/ dementia. Similar presentation yest afternoon. HR improved with IVF bolus. Will order additional 500 cc bolus now. CMP/Mg pending.    Newton Pigg, DO Hospitalist

## 2021-07-02 NOTE — Progress Notes (Signed)
   07/02/21 0150  Assess: MEWS Score  Temp 97.6 F (36.4 C)  BP 100/69  ECG Heart Rate 99  Resp (!) 21  Level of Consciousness Alert  SpO2 96 %  O2 Device Room Air  Assess: MEWS Score  MEWS Temp 0  MEWS Systolic 1  MEWS Pulse 0  MEWS RR 1  MEWS LOC 0  MEWS Score 2  MEWS Score Color Yellow  Assess: if the MEWS score is Yellow or Red  Were vital signs taken at a resting state? Yes  Focused Assessment No change from prior assessment  Early Detection of Sepsis Score *See Row Information* Low  MEWS guidelines implemented *See Row Information* Yes  Treat  MEWS Interventions Escalated (See documentation below)  Take Vital Signs  Increase Vital Sign Frequency  Yellow: Q 2hr X 2 then Q 4hr X 2, if remains yellow, continue Q 4hrs  Escalate  MEWS: Escalate Yellow: discuss with charge nurse/RN and consider discussing with provider and RRT  Notify: Charge Nurse/RN  Name of Charge Nurse/RN Notified Nikki,rn  Date Charge Nurse/RN Notified 07/02/21  Time Charge Nurse/RN Notified 0152  Document  Patient Outcome Not stable and remains on department

## 2021-07-02 NOTE — Progress Notes (Signed)
PROGRESS NOTE        PATIENT DETAILS Name: Alfred Mercado Age: 85 y.o. Sex: male Date of Birth: February 21, 1936 Admit Date: 06/26/2021 Admitting Physician Toy Baker, MD [id number], Rozanna Boer, MD  Brief Narrative: Patient is a 85 y.o. male with history of dementia, PAF on Eliquis, staghorn calculi-who was recently diagnosed with pneumonia at his nursing facility presented with altered mental status-upon further evaluation he was found to have hypercalcemia, AKI.  See below for further details.   Subjective:  Patient in bed, appears confused, in no distress, denies any headache, no fever, no chest pain or pressure, no shortness of breath , no abdominal pain.       Objective: Vitals: Blood pressure 119/73, pulse (!) 128, temperature 97.6 F (36.4 C), temperature source Axillary, resp. rate 20, height 6\' 1"  (1.854 m), weight 67.6 kg, SpO2 96 %.   Exam:  Awake, frail, confused, No new F.N deficits,   [place].AT,PERRAL Supple Neck, No JVD,   Symmetrical Chest wall movement, Good air movement bilaterally, CTAB iRRR,No Gallops, Rubs or new Murmurs,  +ve B.Sounds, Abd Soft, No tenderness,   No Cyanosis, Clubbing or edema     Pertinent Labs/Radiology: Recent Labs  Lab 06/27/21 0925 06/28/21 0610 06/28/21 0725 06/29/21 0109 06/30/21 0120 07/01/21 0341 07/02/21 0558  WBC 6.8  --    < > 9.1  --   --  9.0  HGB 11.1*  --    < > 10.6*  --   --  10.2*  PLT 284  --    < > 312  --   --  266  NA 140 142  --  147*   < > 149* 148*  K 3.0* 3.1*  --  3.0*   < > 2.7* 3.4*  CREATININE 2.61* 2.63*  --  2.68*   < > 2.68* 2.73*  AST 17 22  --  25  --  23 29  ALT 14 12  --  15  --  20 17  ALKPHOS 65 63  --  57  --  65 54  BILITOT 0.7 0.5  --  0.6  --  0.9 0.2*   < > = values in this interval not displayed.   11/1>> PTH: 9 (low) 11/1>> 25 (oh) vitamin D: 63.9 (normal) 11/1>> 1, 2 (OH) vitamin D: 95.6 (high) 11/2>> PTHrP: Pending  10/31>>Blood culture: No  growth 10/31>> urine culture: No growth  10/31>>CXR: Chronic unchanged right-sided pleural effusion. 10/31>> CT head: No acute intracranial pathology, stable left frontotemporal hygroma. 10/31>> CT renal stone study: No acute abnormality, multiple large bilateral renal calculi as previously demonstrated. 11/3>> CT chest: No findings to suggest lymphoma/sarcoidosis, right upper/left lower lobe bronchopneumonia, moderate chronic right pleural effusion.  Underlying COPD.   Assessment/Plan:  Acute metabolic encephalopathy: Due to AKI/hypercalcemia-superimposed with dementia at baseline.  CT head nonacute, moving all 4 extremities without any focal deficits, continue treatment for hypercalcemia.  AKI on CKD stage IIIb: AKI multifactorial-hemodynamically mediated and from hypercalcemia.  UA without proteinuria, renal CT without hydronephrosis.  Continue treatment for hypercalcemia.  Hypercalcemia: Is unclear but likely due to underlying malignancy or sarcoid, ACE level and PTH RP levels are pending, continue with aggressive IV fluids with hydration, has already received Sulman calcitonin , 1 dose of Zometa.  Will monitor closely.  Hypernatremia: Due to extreme dehydration, continue IV D5W.  Hypokalemia - replaced.  Metabolic acidosis: Likely starvation ketoacidosis and from AKI.  Starting oral bicarb supplementation-follow closely.  Probable aspiration pneumonia: Afebrile-CT chest as above-we will plan on at least 5 days of empiric antimicrobial therapy.  Appreciate SLP eval.  Asymptomatic bacteriuria: UA grossly abnormal-unable to obtain history to see if he has symptoms consistent with UTI (demented/agitated).  Thankfully urine culture negative-in any event already on IV antibiotics.    COVID-19 infection: Per ED notes and H&P-diagnosed 2 weeks ago-does not appear to have any active infection at this point.  No need for isolation.  Normocytic anemia: Suspect has a combination of iron  deficiency and anemia of chronic disease at baseline-worsened likely due to IV fluid hydration-required 2 units of PRBC transfusion.  No overt GI bleeding apparent even though FOBT positive.  Resume Eliquis.  Appreciate GI input.    Chronic atrial fibrillation now in RVR: We will resume Eliquis today. Cardizem PO + IV PRN.  ?Dementia/cognitive dysfunction at baseline: Confused-expect worsening delirium during this hospital stay.  Goals of care: Unfortunate 85 year old with some amount of baseline dementia-now with severe encephalopathy in the setting of hypercalcemia/AKI/hypernatremia and aspiration pneumonia.  Given elevation in vitamin D3 levels-concerned that he may have underlying lymphoma/sarcoidosis.  He is extremely frail-and is really not a good candidate for aggressive care.  He unfortunately does not have any family-I have spoken with his contact person who is his pastor-Alfred Mercado- DNR, Med Rx.     Nutrition Status: Nutrition Problem: Severe Malnutrition Etiology: chronic illness (dementia) Signs/Symptoms: percent weight loss, severe fat depletion, severe muscle depletion Percent weight loss: 11.4 % Interventions: Ensure Enlive (each supplement provides 350kcal and 20 grams of protein), MVI    Procedures: None Consults: GI DVT Prophylaxis:  Code Status: DNR Family Communication: Sherri Mercado (contact person/pastor) [phone] on 07/02/21 - DNR, Med Rx.  Time spent: 35 minutes-Greater than 50% of this time was spent in counseling, explanation of diagnosis, planning of further management, and coordination of care.   Disposition Plan: Status is: Inpatient  Remains inpatient appropriate because: Altered mental status not yet at baseline-hypercalcemia/AKI-on IV fluids/calcitonin injections.  Not stable for discharge.   Diet: Diet Order             DIET DYS 2 Room service appropriate? Yes; Fluid consistency: Thin  Diet effective now                      Antimicrobial agents: Anti-infectives (From admission, onward)    Start     Dose/Rate Route Frequency Ordered Stop   06/27/21 1800  azithromycin (ZITHROMAX) 500 mg in sodium chloride 0.9 % 250 mL IVPB        500 mg 250 mL/hr over 60 Minutes Intravenous Every 24 hours 06/26/21 1952 07/01/21 2039   06/27/21 1600  cefTRIAXone (ROCEPHIN) 2 g in sodium chloride 0.9 % 100 mL IVPB        2 g 200 mL/hr over 30 Minutes Intravenous Every 24 hours 06/26/21 1952 07/01/21 1855   06/26/21 1430  cefTRIAXone (ROCEPHIN) 2 g in sodium chloride 0.9 % 100 mL IVPB        2 g 200 mL/hr over 30 Minutes Intravenous  Once 06/26/21 1427 06/26/21 1700   06/26/21 1430  azithromycin (ZITHROMAX) 500 mg in sodium chloride 0.9 % 250 mL IVPB        500 mg 250 mL/hr over 60 Minutes Intravenous  Once 06/26/21 1427 06/26/21 1722  MEDICATIONS: Scheduled Meds:  acetaminophen  1,000 mg Oral TID   apixaban  2.5 mg Oral BID   Chlorhexidine Gluconate Cloth  6 each Topical Daily   dexamethasone (DECADRON) injection  6 mg Intravenous Q24H   diltiazem  60 mg Oral Q8H   feeding supplement  237 mL Oral TID BM   metoprolol tartrate       multivitamin with minerals  1 tablet Oral Daily   pantoprazole  40 mg Oral Q1200   sodium bicarbonate  1,300 mg Oral BID   tamsulosin  0.4 mg Oral Daily   Continuous Infusions:  dextrose 5 % with kcl     PRN Meds:.albuterol, diltiazem, haloperidol lactate, metoprolol tartrate, ondansetron **OR** ondansetron (ZOFRAN) IV   I have personally reviewed following labs and imaging studies  LABORATORY DATA:  Recent Labs  Lab 06/26/21 1426 06/26/21 1948 06/27/21 0441 06/27/21 0925 06/28/21 0725 06/29/21 0109 07/02/21 0558  WBC 7.5   < > 6.6 6.8 9.6 9.1 9.0  HGB 9.6*   < > 10.8* 11.1* 11.8* 10.6* 10.2*  HCT 28.5*   < > 31.7* 32.3* 34.5* 31.7* 31.5*  PLT 297   < > 258 284 287 312 266  MCV 86.4   < > 86.4 87.3 87.3 87.6 90.0  MCH 29.1   < > 29.4 30.0 29.9 29.3 29.1  MCHC  33.7   < > 34.1 34.4 34.2 33.4 32.4  RDW 16.1*   < > 15.8* 15.9* 16.1* 16.3* 17.7*  LYMPHSABS 0.9  --   --  0.7  --   --  0.7  MONOABS 0.8  --   --  0.5  --   --  0.6  EOSABS 0.1  --   --  0.0  --   --  0.1  BASOSABS 0.1  --   --  0.0  --   --  0.0   < > = values in this interval not displayed.    Recent Labs  Lab  0000 06/26/21 1426 06/26/21 1431 06/26/21 1815 06/26/21 1839 06/26/21 1948 06/26/21 2030 06/27/21 0441 06/27/21 0925 06/28/21 0610 06/29/21 0109 06/30/21 0120 07/01/21 0341 07/02/21 0558  NA  --  136  --   --   --    < >  --   --  140 142 147* 151* 149* 148*  K  --  2.7*  --   --   --    < >  --   --  3.0* 3.1* 3.0* 3.4* 2.7* 3.4*  CL  --  110  --   --   --    < >  --   --  116* 118* 125* 128* 128* 127*  CO2  --  17*  --   --   --    < >  --   --  17* 16* 15* 14* 14* 14*  GLUCOSE  --  156*  --   --   --    < >  --   --  119* 97 93 107* 111* 113*  BUN  --  47*  --   --   --    < >  --   --  38* 37* 39* 41* 45* 42*  CREATININE  --  3.11*  --   --   --    < >  --   --  2.61* 2.63* 2.68* 2.81* 2.68* 2.73*  CALCIUM  --  12.7*  --   --   --    < >  --   --  10.9*  10.7* 10.5* 10.3 10.9* 10.9* 10.5*  AST  --  18  --   --   --   --   --   --  17 22 25   --  23 29  ALT  --  16  --   --   --   --   --   --  14 12 15   --  20 17  ALKPHOS  --  69  --   --   --   --   --   --  65 63 57  --  65 54  BILITOT  --  0.3  --   --   --   --   --   --  0.7 0.5 0.6  --  0.9 0.2*  ALBUMIN  --  2.6*  --   --   --   --   --   --  2.4* 2.4* 2.3*  --  2.3* 2.2*  MG   < >  --   --   --  2.0  --   --   --  1.8  --  1.6* 2.1 1.8 1.8  PROCALCITON  --   --   --   --  <0.10  --   --   --   --   --   --   --   --   --   LATICACIDVEN  --   --  2.0* 1.1  --   --   --   --   --   --   --   --   --   --   INR  --  1.5*  --   --   --   --   --   --   --   --   --   --   --   --   TSH  --   --   --   --   --   --   --  0.590  --   --   --   --   --   --   HGBA1C  --   --   --   --   --   --   --  5.5  --    --   --   --   --   --   AMMONIA  --   --   --   --   --   --  21  --   --   --   --   --   --   --   BNP  --   --   --   --   --   --   --   --   --   --   --   --   --  670.1*   < > = values in this interval not displayed.         RADIOLOGY STUDIES/RESULTS: DG Chest Port 1 View  Result Date: 07/01/2021 CLINICAL DATA:  Altered mental status and shortness of breath. EXAM: PORTABLE CHEST 1 VIEW COMPARISON:  Chest CT June 29, 2021 and radiograph June 26, 2021 FINDINGS: The heart size and mediastinal contours are partially obscured but appear unchanged. Similar moderate right pleural effusion. No left-sided effusion visualized. Similar bilateral interstitial opacities, favored to reflect bronchopneumonia. The visualized skeletal structures are unremarkable. IMPRESSION: 1. Similar moderate right pleural effusion. 2. Similar bilateral interstitial opacities, favored  to reflect bronchopneumonia. Electronically Signed   By: Dahlia Bailiff M.D.   On: 07/01/2021 17:36     LOS: 6 days   Signature  Lala Lund M.D on 07/02/2021 at 10:54 AM   -  To page go to [email or web address]

## 2021-07-02 NOTE — TOC Progression Note (Addendum)
Transition of Care [facility]) - Progression Note    Patient Details  Name: BARDIA WANGERIN MRN: [id number] Date of Birth: [birth year]  Transition [facility] Care [facility]) CM/SW Contact  Delilah Shan, LCSWA Phone Number: 07/02/2021, 2:49 PM  Clinical Narrative:     Patient is from [facility] long term. CSW [place] awaiting callback from [place] with DSS department of aging to make sure that patient does not have a legal guardian through DSS. CSW will continue to follow and assist with patients DC planning needs.  Expected Discharge Plan: Skilled Nursing Facility Barriers to Discharge: Continued Medical Work up  Expected Discharge Plan and Services Expected Discharge Plan: Skilled Nursing Facility In-house Referral: Clinical Social Work   Post Acute Care Choice: Skilled Nursing Facility Living arrangements for the past 2 months: Skilled Nursing Facility                                       Social Determinants of Health (SDOH) Interventions    Readmission Risk Interventions Readmission Risk Prevention Plan 06/28/2021 03/17/2021  Transportation Screening Complete Complete  Medication Review [employer]) Complete Referral to Pharmacy  PCP or Specialist appointment within 3-5 days of discharge Complete Complete  HRI or Home Care Consult Complete Complete  SW Recovery Care/Counseling Consult Complete Complete  Palliative Care Screening Complete Not Applicable  Skilled Nursing Facility Complete Complete  Some recent data might be hidden

## 2021-07-03 ENCOUNTER — Inpatient Hospital Stay (HOSPITAL_COMMUNITY): Payer: Medicare Other

## 2021-07-03 DIAGNOSIS — N179 Acute kidney failure, unspecified: Secondary | ICD-10-CM | POA: Diagnosis not present

## 2021-07-03 LAB — CBC WITH DIFFERENTIAL/PLATELET
Abs Immature Granulocytes: 0.09 10*3/uL — ABNORMAL HIGH (ref 0.00–0.07)
Basophils Absolute: 0 10*3/uL (ref 0.0–0.1)
Basophils Relative: 0 %
Eosinophils Absolute: 0 10*3/uL (ref 0.0–0.5)
Eosinophils Relative: 0 %
HCT: 27.1 % — ABNORMAL LOW (ref 39.0–52.0)
Hemoglobin: 9 g/dL — ABNORMAL LOW (ref 13.0–17.0)
Immature Granulocytes: 1 %
Lymphocytes Relative: 7 %
Lymphs Abs: 0.6 10*3/uL — ABNORMAL LOW (ref 0.7–4.0)
MCH: 29.1 pg (ref 26.0–34.0)
MCHC: 33.2 g/dL (ref 30.0–36.0)
MCV: 87.7 fL (ref 80.0–100.0)
Monocytes Absolute: 0.4 10*3/uL (ref 0.1–1.0)
Monocytes Relative: 4 %
Neutro Abs: 6.9 10*3/uL (ref 1.7–7.7)
Neutrophils Relative %: 88 %
Platelets: 246 10*3/uL (ref 150–400)
RBC: 3.09 MIL/uL — ABNORMAL LOW (ref 4.22–5.81)
RDW: 17.3 % — ABNORMAL HIGH (ref 11.5–15.5)
WBC: 7.9 10*3/uL (ref 4.0–10.5)
nRBC: 0 % (ref 0.0–0.2)

## 2021-07-03 LAB — MAGNESIUM: Magnesium: 1.9 mg/dL (ref 1.7–2.4)

## 2021-07-03 LAB — COMPREHENSIVE METABOLIC PANEL
ALT: 18 U/L (ref 0–44)
AST: 18 U/L (ref 15–41)
Albumin: 2.1 g/dL — ABNORMAL LOW (ref 3.5–5.0)
Alkaline Phosphatase: 52 U/L (ref 38–126)
Anion gap: 6 (ref 5–15)
BUN: 44 mg/dL — ABNORMAL HIGH (ref 8–23)
CO2: 14 mmol/L — ABNORMAL LOW (ref 22–32)
Calcium: 10.2 mg/dL (ref 8.9–10.3)
Chloride: 125 mmol/L — ABNORMAL HIGH (ref 98–111)
Creatinine, Ser: 2.7 mg/dL — ABNORMAL HIGH (ref 0.61–1.24)
GFR, Estimated: 23 mL/min — ABNORMAL LOW (ref >60)
Glucose, Bld: 178 mg/dL — ABNORMAL HIGH (ref 70–99)
Potassium: 3.6 mmol/L (ref 3.5–5.1)
Sodium: 145 mmol/L (ref 135–145)
Total Bilirubin: 0.5 mg/dL (ref 0.3–1.2)
Total Protein: 4.5 g/dL — ABNORMAL LOW (ref 6.5–8.1)

## 2021-07-03 LAB — ANGIOTENSIN CONVERTING ENZYME: Angiotensin-Converting Enzyme: 25 U/L (ref 14–82)

## 2021-07-03 LAB — BRAIN NATRIURETIC PEPTIDE: B Natriuretic Peptide: 663.1 pg/mL — ABNORMAL HIGH (ref 0.0–100.0)

## 2021-07-03 LAB — LACTATE DEHYDROGENASE: LDH: 158 U/L (ref 98–192)

## 2021-07-03 MED ORDER — DILTIAZEM HCL 60 MG PO TABS
120.0000 mg | ORAL_TABLET | Freq: Two times a day (BID) | ORAL | Status: DC
  Administered 2021-07-03 – 2021-07-04 (×3): 120 mg via ORAL
  Filled 2021-07-03 (×6): qty 2

## 2021-07-03 MED ORDER — CEPHALEXIN 500 MG PO CAPS
500.0000 mg | ORAL_CAPSULE | Freq: Two times a day (BID) | ORAL | Status: DC
  Administered 2021-07-03 – 2021-07-04 (×2): 500 mg via ORAL
  Filled 2021-07-03 (×3): qty 1

## 2021-07-03 NOTE — Progress Notes (Signed)
PROGRESS NOTE        PATIENT DETAILS Name: Alfred Mercado Age: 85 y.o. Sex: male Date of Birth: 17-Feb-1936 Admit Date: 06/26/2021 Admitting Physician Toy Baker, MD [id number], Rozanna Boer, MD  Brief Narrative:  Patient is a 85 y.o. male with history of dementia, PAF on Eliquis, staghorn calculi-who was recently diagnosed with pneumonia at his nursing facility presented with altered mental status-upon further evaluation he was found to have hypercalcemia, AKI.  See below for further details.   Pertinent Tests:    11/1>> PTH: 9 (low) 11/1>> 25 (oh) vitamin D: 63.9 (normal) 11/1>> 1, 2 (OH) vitamin D: 95.6 (high) 11/2>> PTHrP: Pending  10/31>>Blood culture: No growth 10/31>> urine culture: No growth  10/31>>CXR: Chronic unchanged right-sided pleural effusion. 10/31>> CT head: No acute intracranial pathology, stable left frontotemporal hygroma. 10/31>> CT renal stone study: No acute abnormality, multiple large bilateral renal calculi as previously demonstrated. 11/3>> CT chest: No findings to suggest lymphoma/sarcoidosis, right upper/left lower lobe bronchopneumonia, moderate chronic right pleural effusion.  Underlying COPD.    Subjective:  Patient in bed, appears comfortable, denies any headache, no fever, no chest pain or pressure, no shortness of breath , no abdominal pain. No new focal weakness.    Objective:  Vitals: Blood pressure 122/83, pulse 95, temperature (!) 97.3 F (36.3 C), temperature source Axillary, resp. rate (!) 22, height 6\' 1"  (1.854 m), weight 67.6 kg, SpO2 100 %.   Exam:  Awake mildly confused, No new F.N deficits, Normal affect [place].AT,PERRAL Supple Neck, No JVD,   Symmetrical Chest wall movement, Good air movement bilaterally, CTAB RRR,No Gallops, Rubs or new Murmurs,  +ve B.Sounds, Abd Soft, No tenderness,   No Cyanosis, Clubbing or edema    Assessment/Plan:  Acute metabolic encephalopathy: Due to  AKI/hypercalcemia-superimposed with dementia at baseline.  CT head nonacute, moving all 4 extremities without any focal deficits, continue treatment for hypercalcemia.  AKI on CKD stage IIIb: AKI multifactorial-hemodynamically mediated and from hypercalcemia.  UA without proteinuria, renal CT without hydronephrosis.  Continue treatment for hypercalcemia.  Hypercalcemia: is unclear but likely due to underlying malignancy or sarcoid, ACE level and PTH RP levels are pending, continue with aggressive IV fluids with hydration, has already received Sulman calcitonin, 1 dose of Zometa.  Will monitor closely.  Hypernatremia: Due to extreme dehydration, resolved post  IV D5W.  Hyperchloremia.  Due to normal saline and LR infusion.  Now that dehydration has resolved.  Stop further IV fluids and monitor. ? RTA.  Metabolic acidosis: Likely starvation ketoacidosis and from AKI.  Starting oral bicarb supplementation-follow closely.  Probable aspiration pneumonia: Afebrile-CT chest as above-we will plan on at least 5 days of empiric antimicrobial therapy.  Appreciate SLP eval.  Asymptomatic bacteriuria: UA grossly abnormal-unable to obtain history to see if he has symptoms consistent with UTI (demented/agitated).  Thankfully urine culture negative-in any event already on IV antibiotics.    COVID-19 infection: Per ED notes and H&P-diagnosed 2 weeks ago-does not appear to have any active infection at this point.  No need for isolation.  Normocytic anemia: Suspect has a combination of iron deficiency and anemia of chronic disease at baseline-worsened likely due to IV fluid hydration-required 2 units of PRBC transfusion.  No overt GI bleeding apparent even though FOBT positive.  Resume Eliquis.  Appreciate GI input.    Chronic atrial fibrillation now in RVR:  We will resume Eliquis today.  Rate control much better on moderate to high-dose oral Cardizem along with as needed IV Cardizem as  needed.  ?Dementia/cognitive dysfunction at baseline: Confused-expect worsening delirium during this hospital stay.  Goals of care: Unfortunate 85 year old with some amount of baseline dementia-now with severe encephalopathy in the setting of hypercalcemia/AKI/hypernatremia and aspiration pneumonia.  Given elevation in vitamin D3 levels-concerned that he may have underlying lymphoma/sarcoidosis.  He is extremely frail-and is really not a good candidate for aggressive care.  He unfortunately does not have any family-I have spoken with his contact person who is his pastor-[name]- DNR, Med Rx.     Nutrition Status: Nutrition Problem: Severe Malnutrition Etiology: chronic illness (dementia) Signs/Symptoms: percent weight loss, severe fat depletion, severe muscle depletion Percent weight loss: 11.4 % Interventions: Ensure Enlive (each supplement provides 350kcal and 20 grams of protein), MVI    Procedures: None Consults: GI DVT Prophylaxis:  Code Status: DNR Family Communication: Lillard Anes (contact person/pastor) [phone] on 07/02/21 - DNR, Med Rx.  Time spent: 35 minutes-Greater than 50% of this time was spent in counseling, explanation of diagnosis, planning of further management, and coordination of care.   Disposition Plan: Status is: Inpatient  Remains inpatient appropriate because: Altered mental status not yet at baseline-hypercalcemia/AKI-on IV fluids/calcitonin injections.  Not stable for discharge.   Diet: Diet Order             DIET DYS 2 Room service appropriate? Yes; Fluid consistency: Thin  Diet effective now                     Antimicrobial agents: Anti-infectives (From admission, onward)    Start     Dose/Rate Route Frequency Ordered Stop   07/03/21 1000  cephALEXin (KEFLEX) capsule 500 mg        500 mg Oral Every 12 hours 07/03/21 0748 07/08/21 0959   06/27/21 1800  azithromycin (ZITHROMAX) 500 mg in sodium chloride 0.9 % 250 mL  IVPB        500 mg 250 mL/hr over 60 Minutes Intravenous Every 24 hours 06/26/21 1952 07/01/21 2039   06/27/21 1600  cefTRIAXone (ROCEPHIN) 2 g in sodium chloride 0.9 % 100 mL IVPB        2 g 200 mL/hr over 30 Minutes Intravenous Every 24 hours 06/26/21 1952 07/01/21 1855   06/26/21 1430  cefTRIAXone (ROCEPHIN) 2 g in sodium chloride 0.9 % 100 mL IVPB        2 g 200 mL/hr over 30 Minutes Intravenous  Once 06/26/21 1427 06/26/21 1700   06/26/21 1430  azithromycin (ZITHROMAX) 500 mg in sodium chloride 0.9 % 250 mL IVPB        500 mg 250 mL/hr over 60 Minutes Intravenous  Once 06/26/21 1427 06/26/21 1722        MEDICATIONS: Scheduled Meds:  acetaminophen  1,000 mg Oral TID   apixaban  2.5 mg Oral BID   cephALEXin  500 mg Oral Q12H   Chlorhexidine Gluconate Cloth  6 each Topical Daily   dexamethasone (DECADRON) injection  6 mg Intravenous Q24H   diltiazem  120 mg Oral Q12H   feeding supplement  237 mL Oral TID BM   multivitamin with minerals  1 tablet Oral Daily   pantoprazole  40 mg Oral Q1200   sodium bicarbonate  1,300 mg Oral BID   tamsulosin  0.4 mg Oral Daily   Continuous Infusions:   PRN Meds:.albuterol, diltiazem, haloperidol lactate, ondansetron **  OR** ondansetron (ZOFRAN) IV   I have personally reviewed following labs and imaging studies  LABORATORY DATA:  Recent Labs  Lab 06/26/21 1426 06/26/21 1948 06/27/21 0925 06/28/21 0725 06/29/21 0109 07/02/21 0558 07/03/21 0037  WBC 7.5   < > 6.8 9.6 9.1 9.0 7.9  HGB 9.6*   < > 11.1* 11.8* 10.6* 10.2* 9.0*  HCT 28.5*   < > 32.3* 34.5* 31.7* 31.5* 27.1*  PLT 297   < > 284 287 312 266 246  MCV 86.4   < > 87.3 87.3 87.6 90.0 87.7  MCH 29.1   < > 30.0 29.9 29.3 29.1 29.1  MCHC 33.7   < > 34.4 34.2 33.4 32.4 33.2  RDW 16.1*   < > 15.9* 16.1* 16.3* 17.7* 17.3*  LYMPHSABS 0.9  --  0.7  --   --  0.7 0.6*  MONOABS 0.8  --  0.5  --   --  0.6 0.4  EOSABS 0.1  --  0.0  --   --  0.1 0.0  BASOSABS 0.1  --  0.0  --   --   0.0 0.0   < > = values in this interval not displayed.    Recent Labs  Lab 06/26/21 1426 06/26/21 1431 06/26/21 1815 06/26/21 1839 06/26/21 1948 06/26/21 2030 06/27/21 0441 06/27/21 0925 06/28/21 0610 06/29/21 0109 06/30/21 0120 07/01/21 0341 07/02/21 0558 07/03/21 0037  NA 136  --   --   --    < >  --   --    < > 142 147* 151* 149* 148* 145  K 2.7*  --   --   --    < >  --   --    < > 3.1* 3.0* 3.4* 2.7* 3.4* 3.6  CL 110  --   --   --    < >  --   --    < > 118* 125* 128* 128* 127* 125*  CO2 17*  --   --   --    < >  --   --    < > 16* 15* 14* 14* 14* 14*  GLUCOSE 156*  --   --   --    < >  --   --    < > 97 93 107* 111* 113* 178*  BUN 47*  --   --   --    < >  --   --    < > 37* 39* 41* 45* 42* 44*  CREATININE 3.11*  --   --   --    < >  --   --    < > 2.63* 2.68* 2.81* 2.68* 2.73* 2.70*  CALCIUM 12.7*  --   --   --    < >  --   --    < > 10.5* 10.3 10.9* 10.9* 10.5* 10.2  AST 18  --   --   --   --   --   --    < > 22 25  --  23 29 18   ALT 16  --   --   --   --   --   --    < > 12 15  --  20 17 18   ALKPHOS 69  --   --   --   --   --   --    < > 63 57  --  65 54 52  BILITOT 0.3  --   --   --   --   --   --    < >  0.5 0.6  --  0.9 0.2* 0.5  ALBUMIN 2.6*  --   --   --   --   --   --    < > 2.4* 2.3*  --  2.3* 2.2* 2.1*  MG  --   --   --  2.0  --   --   --    < >  --  1.6* 2.1 1.8 1.8 1.9  PROCALCITON  --   --   --  <0.10  --   --   --   --   --   --   --   --   --   --   LATICACIDVEN  --  2.0* 1.1  --   --   --   --   --   --   --   --   --   --   --   INR 1.5*  --   --   --   --   --   --   --   --   --   --   --   --   --   TSH  --   --   --   --   --   --  0.590  --   --   --   --   --   --   --   HGBA1C  --   --   --   --   --   --  5.5  --   --   --   --   --   --   --   AMMONIA  --   --   --   --   --  21  --   --   --   --   --   --   --   --   BNP  --   --   --   --   --   --   --   --   --   --   --   --  670.1* 663.1*   < > = values in this interval not displayed.          RADIOLOGY STUDIES/RESULTS: DG Chest Port 1 View  Result Date: 07/01/2021 CLINICAL DATA:  Altered mental status and shortness of breath. EXAM: PORTABLE CHEST 1 VIEW COMPARISON:  Chest CT June 29, 2021 and radiograph June 26, 2021 FINDINGS: The heart size and mediastinal contours are partially obscured but appear unchanged. Similar moderate right pleural effusion. No left-sided effusion visualized. Similar bilateral interstitial opacities, favored to reflect bronchopneumonia. The visualized skeletal structures are unremarkable. IMPRESSION: 1. Similar moderate right pleural effusion. 2. Similar bilateral interstitial opacities, favored to reflect bronchopneumonia. Electronically Signed   By: Maudry Mayhew M.D.   On: 07/01/2021 17:36     LOS: 7 days   Signature  Susa Raring M.D on 07/03/2021 at 11:39 AM   -  To page go to [email or web address]

## 2021-07-03 NOTE — Progress Notes (Signed)
Physical Therapy Treatment and Discharge Patient Details Name: Alfred Mercado MRN: [id number] DOB: 1935-09-07 Today's Date: 07/03/2021   History of Present Illness Pt is a 85 yr old male who presented to [facility] hospital on 06/26/2021 due to acute encephalopathy. Of note pt also with recent diagnosis of COVID-19 and PNA. PMH of sepsis, diastolic CHF, afib, dementia, COVID, CKD.    PT Comments    Patient again without ability to participate in therapy and requests to lie down as soon as assisted to sit EOB. Noted per [facility] team note that patient was transitioned to long-term care and was not receiving skilled services at facility. Based on his two sessions of PT while in acute care, feel return to long-term care is most appropriate as he does not demonstrate the ability to make substantial progress with therapies. Patient is discharged from PT.     Recommendations for follow up therapy are one component of a multi-disciplinary discharge planning process, led by the attending physician.  Recommendations may be updated based on patient status, additional functional criteria and insurance authorization.  Follow Up Recommendations  Long-term institutional care without follow-up therapy (return to LTC)     Assistance Recommended at Discharge Frequent or constant Supervision/Assistance  Equipment Recommendations  None recommended by PT    Recommendations for Other Services       Precautions / Restrictions Precautions Precautions: Fall Precaution Comments: monitor HR, tachy, afib     Mobility  Bed Mobility Overal bed mobility: Needs Assistance Bed Mobility: Rolling;Sidelying to Sit;Sit to Sidelying Rolling: Total assist Sidelying to sit: Total assist;+2 for physical assistance     Sit to sidelying: Total assist;+2 for physical assistance General bed mobility comments: pt does not resist, but does not assist with mobility    Transfers                   General transfer comment:  pt not participating with requests and asking to lie down    Ambulation/Gait                   Stairs             Wheelchair Mobility    Modified Rankin (Stroke Patients Only)       Balance Overall balance assessment: Needs assistance Sitting-balance support: Single extremity supported;Feet unsupported Sitting balance-[name]: Zero Sitting balance - Comments: min-mod assist to maintain upright; attempted to work on forward lean/flexion wiht pt resisting and pushing posteriorly Postural control: Posterior lean                                  Cognition Arousal/Alertness: Awake/alert Behavior During Therapy: Anxious Overall Cognitive Status: Difficult to assess                                 General Comments: speech is mumbly, garbled and difficult to understand        Exercises      General Comments        Pertinent Vitals/Pain Pain Assessment: Faces Faces Pain Scale: No hurt Pain Location: stomach Pain Descriptors / Indicators: Moaning    Home Living                          Prior Function  PT Goals (current goals can now be found in the care plan section) Acute Rehab PT Goals Patient Stated Goal: pt is unable to state. PT goal to improving bed mobility and sitting balance quality PT Goal Formulation: Patient unable to participate in goal setting Time For Goal Achievement: 07/12/21 Potential to Achieve Goals: Poor Progress towards PT goals: Not progressing toward goals - comment    Frequency           PT Plan Discharge plan needs to be updated;Frequency needs to be updated    Co-evaluation              AM-PAC PT "6 Clicks" Mobility   Outcome Measure  Help needed turning from your back to your side while in a flat bed without using bedrails?: Total Help needed moving from lying on your back to sitting on the side of a flat bed without using bedrails?: Total Help needed  moving to and from a bed to a chair (including a wheelchair)?: Total Help needed standing up from a chair using your arms (e.g., wheelchair or bedside chair)?: Total Help needed to walk in hospital room?: Total Help needed climbing 3-5 steps with a railing? : Total 6 Click Score: 6    End of Session   Activity Tolerance: Patient tolerated treatment well Patient left: in bed;with call bell/phone within reach;with bed alarm set Nurse Communication: Mobility status;Need for lift equipment PT Visit Diagnosis: Other abnormalities of gait and mobility (R26.89);Muscle weakness (generalized) (M62.81)     Time: 5427-0623 PT Time Calculation (min) (ACUTE ONLY): 16 min  Charges:  $Therapeutic Activity: 8-22 mins                      Alfred Mercado, PT Acute Rehabilitation Services  Pager [phone] Office [phone]    Zena Amos 07/03/2021, 2:58 PM

## 2021-07-03 NOTE — Discharge Instructions (Addendum)
Disposition.  Residential hospice °Condition.  Guarded °CODE STATUS.  DNR °Activity.  With assistance as tolerated, full fall precautions. °Diet.  Soft with feeding assistance and aspiration precautions. °Goal of care.  Comfort. ° °

## 2021-07-04 ENCOUNTER — Inpatient Hospital Stay (HOSPITAL_COMMUNITY): Payer: Medicare Other

## 2021-07-04 DIAGNOSIS — R531 Weakness: Secondary | ICD-10-CM | POA: Diagnosis not present

## 2021-07-04 DIAGNOSIS — E43 Unspecified severe protein-calorie malnutrition: Secondary | ICD-10-CM | POA: Diagnosis not present

## 2021-07-04 DIAGNOSIS — N179 Acute kidney failure, unspecified: Secondary | ICD-10-CM | POA: Diagnosis not present

## 2021-07-04 DIAGNOSIS — Z66 Do not resuscitate: Secondary | ICD-10-CM

## 2021-07-04 DIAGNOSIS — E876 Hypokalemia: Secondary | ICD-10-CM | POA: Diagnosis not present

## 2021-07-04 LAB — CBC WITH DIFFERENTIAL/PLATELET
Abs Immature Granulocytes: 0.06 10*3/uL (ref 0.00–0.07)
Basophils Absolute: 0 10*3/uL (ref 0.0–0.1)
Basophils Relative: 0 %
Eosinophils Absolute: 0 10*3/uL (ref 0.0–0.5)
Eosinophils Relative: 0 %
HCT: 30.7 % — ABNORMAL LOW (ref 39.0–52.0)
Hemoglobin: 10.1 g/dL — ABNORMAL LOW (ref 13.0–17.0)
Immature Granulocytes: 1 %
Lymphocytes Relative: 6 %
Lymphs Abs: 0.6 10*3/uL — ABNORMAL LOW (ref 0.7–4.0)
MCH: 29.4 pg (ref 26.0–34.0)
MCHC: 32.9 g/dL (ref 30.0–36.0)
MCV: 89.5 fL (ref 80.0–100.0)
Monocytes Absolute: 0.3 10*3/uL (ref 0.1–1.0)
Monocytes Relative: 3 %
Neutro Abs: 9.2 10*3/uL — ABNORMAL HIGH (ref 1.7–7.7)
Neutrophils Relative %: 90 %
Platelets: 255 10*3/uL (ref 150–400)
RBC: 3.43 MIL/uL — ABNORMAL LOW (ref 4.22–5.81)
RDW: 18 % — ABNORMAL HIGH (ref 11.5–15.5)
WBC: 10.1 10*3/uL (ref 4.0–10.5)
nRBC: 0 % (ref 0.0–0.2)

## 2021-07-04 LAB — COMPREHENSIVE METABOLIC PANEL
ALT: 21 U/L (ref 0–44)
AST: 21 U/L (ref 15–41)
Albumin: 2.4 g/dL — ABNORMAL LOW (ref 3.5–5.0)
Alkaline Phosphatase: 63 U/L (ref 38–126)
Anion gap: 9 (ref 5–15)
BUN: 48 mg/dL — ABNORMAL HIGH (ref 8–23)
CO2: 14 mmol/L — ABNORMAL LOW (ref 22–32)
Calcium: 10.3 mg/dL (ref 8.9–10.3)
Chloride: 125 mmol/L — ABNORMAL HIGH (ref 98–111)
Creatinine, Ser: 2.69 mg/dL — ABNORMAL HIGH (ref 0.61–1.24)
GFR, Estimated: 23 mL/min — ABNORMAL LOW (ref >60)
Glucose, Bld: 125 mg/dL — ABNORMAL HIGH (ref 70–99)
Potassium: 3.6 mmol/L (ref 3.5–5.1)
Sodium: 148 mmol/L — ABNORMAL HIGH (ref 135–145)
Total Bilirubin: 0.7 mg/dL (ref 0.3–1.2)
Total Protein: 5.2 g/dL — ABNORMAL LOW (ref 6.5–8.1)

## 2021-07-04 LAB — PTH-RELATED PEPTIDE: PTH-related peptide: <2 pmol/L

## 2021-07-04 LAB — MAGNESIUM: Magnesium: 2 mg/dL (ref 1.7–2.4)

## 2021-07-04 LAB — BRAIN NATRIURETIC PEPTIDE: B Natriuretic Peptide: 468.3 pg/mL — ABNORMAL HIGH (ref 0.0–100.0)

## 2021-07-04 MED ORDER — GLYCOPYRROLATE 1 MG PO TABS
1.0000 mg | ORAL_TABLET | ORAL | Status: DC | PRN
  Filled 2021-07-04: qty 1

## 2021-07-04 MED ORDER — BIOTENE DRY MOUTH MT LIQD: 15.0000 mL | OROMUCOSAL | Status: DC | PRN

## 2021-07-04 MED ORDER — GLYCOPYRROLATE 0.2 MG/ML IJ SOLN
0.2000 mg | INTRAMUSCULAR | Status: DC | PRN
  Administered 2021-07-04 – 2021-07-06 (×6): 0.2 mg via INTRAVENOUS
  Filled 2021-07-04 (×3): qty 1

## 2021-07-04 MED ORDER — DEXTROSE 5 % IV SOLN: INTRAVENOUS | Status: DC

## 2021-07-04 MED ORDER — LORAZEPAM 1 MG PO TABS: 1.0000 mg | ORAL_TABLET | ORAL | Status: DC | PRN

## 2021-07-04 MED ORDER — LORAZEPAM 2 MG/ML IJ SOLN: 1.0000 mg | INTRAMUSCULAR | Status: DC | PRN

## 2021-07-04 MED ORDER — GLYCOPYRROLATE 0.2 MG/ML IJ SOLN
0.2000 mg | INTRAMUSCULAR | Status: DC | PRN
  Filled 2021-07-04 (×3): qty 1

## 2021-07-04 MED ORDER — POLYVINYL ALCOHOL 1.4 % OP SOLN: 1.0000 [drp] | Freq: Four times a day (QID) | OPHTHALMIC | Status: DC | PRN

## 2021-07-04 MED ORDER — LORAZEPAM 2 MG/ML PO CONC: 1.0000 mg | ORAL | Status: DC | PRN

## 2021-07-04 MED ORDER — HYDROMORPHONE HCL 1 MG/ML IJ SOLN
0.5000 mg | INTRAMUSCULAR | Status: DC | PRN
  Administered 2021-07-04 – 2021-07-06 (×6): 0.5 mg via INTRAVENOUS
  Filled 2021-07-04 (×6): qty 0.5

## 2021-07-04 NOTE — Progress Notes (Signed)
Pt has yellow MEWS.  No changes from reported baseline.  Pt refused all medication tonight due to being confused.  Pt remains fidgity, but not combative at this time.   07/03/21 2025  Assess: MEWS Score  Temp (!) 97.2 F (36.2 C)  BP 109/68  Pulse Rate (!) 111  ECG Heart Rate (!) 109  Resp (!) 21  Level of Consciousness Alert  SpO2 99 %  Assess: MEWS Score  MEWS Temp 0  MEWS Systolic 0  MEWS Pulse 1  MEWS RR 1  MEWS LOC 0  MEWS Score 2  MEWS Score Color Yellow  Assess: if the MEWS score is Yellow or Red  Were vital signs taken at a resting state? Yes  Focused Assessment No change from prior assessment  Early Detection of Sepsis Score *See Row Information* Low  MEWS guidelines implemented *See Row Information* Yes  Treat  Pain Scale Faces  Faces Pain Scale 0  Take Vital Signs  Increase Vital Sign Frequency  Yellow: Q 2hr X 2 then Q 4hr X 2, if remains yellow, continue Q 4hrs  Document  Patient Outcome Not stable and remains on department

## 2021-07-04 NOTE — Progress Notes (Signed)
PROGRESS NOTE        PATIENT DETAILS Name: Alfred Mercado Age: 85 y.o. Sex: male Date of Birth: [birth year] Admit Date: 06/26/2021 Admitting Physician Therisa Doyne, MD [id number], Denny Levy, MD  Brief Narrative:  Patient is a 85 y.o. male with history of dementia, PAF on Eliquis, staghorn calculi-who was recently diagnosed with pneumonia at his nursing facility presented with altered mental status-upon further evaluation he was found to have hypercalcemia, AKI.  See below for further details.   Pertinent Tests:    11/1>> PTH: 9 (low) 11/1>> 25 (oh) vitamin D: 63.9 (normal) 11/1>> 1, 2 (OH) vitamin D: 95.6 (high) 11/2>> PTHrP: Pending  10/31>>Blood culture: No growth 10/31>> urine culture: No growth  10/31>>CXR: Chronic unchanged right-sided pleural effusion. 10/31>> CT head: No acute intracranial pathology, stable left frontotemporal hygroma. 10/31>> CT renal stone study: No acute abnormality, multiple large bilateral renal calculi as previously demonstrated. 11/3>> CT chest: No findings to suggest lymphoma/sarcoidosis, right upper/left lower lobe bronchopneumonia, moderate chronic right pleural effusion.  Underlying COPD.    Subjective:  Patient in bed, appears comfortable, denies any headache, no fever, no chest pain or pressure, no shortness of breath , no abdominal pain. No new focal weakness.    Objective:  Vitals: Blood pressure 90/63, pulse 77, temperature (!) 97.5 F (36.4 C), temperature source Axillary, resp. rate 19, height 6\' 1"  (1.854 m), weight 67.6 kg, SpO2 100 %.   Exam:  Awake but confused, appears very weak and frail, has some upper airway gurgling, .AT,PERRAL Supple Neck, No JVD,   Symmetrical Chest wall movement, Good air movement bilaterally, few crackles RRR,No Gallops, Rubs or new Murmurs,  +ve B.Sounds, Abd Soft, No tenderness,   No Cyanosis, Clubbing or edema     Assessment/Plan:  Acute metabolic  encephalopathy: Due to AKI/hypercalcemia-superimposed with dementia at baseline.  CT head nonacute, moving all 4 extremities without any focal deficits, continue treatment for hypercalcemia.  AKI on CKD stage IIIb: AKI multifactorial-hemodynamically mediated and from hypercalcemia.  UA without proteinuria, renal CT without hydronephrosis, repeat renal ultrasound nonacute on 07/03/2021.  Continue treatment for hypercalcemia.  Hypercalcemia: is unclear but likely due to underlying malignancy or sarcoid, ACE level and PTH RP levels are pending, continue with aggressive IV fluids with hydration, has already received Sulman calcitonin, 1 dose of Zometa.  Will monitor closely.  Hypernatremia: Due to extreme dehydration, continue D5W as needed.  Hyperchloremia.  Due to normal saline and LR infusion.  Now that dehydration has resolved.  Stop further IV fluids and monitor. ? RTA.  Metabolic acidosis: Likely starvation ketoacidosis and from AKI.  Starting oral bicarb supplementation-follow closely.  Probable aspiration pneumonia: Afebrile-CT chest as above-we will plan on at least 5 days of empiric antimicrobial therapy.  He has developed more upper airway gurgling on 07/04/2021 will reinvolve speech, elevate head of the bed and monitor.  Poor prognosis if he is aspirating repeatedly.  Asymptomatic bacteriuria: UA grossly abnormal-unable to obtain history to see if he has symptoms consistent with UTI (demented/agitated).  Thankfully urine culture negative-in any event already on IV antibiotics.    COVID-19 infection: Per ED notes and H&P-diagnosed 2 weeks ago-does not appear to have any active infection at this point.  No need for isolation.  Normocytic anemia: Suspect has a combination of iron deficiency and anemia of chronic disease at baseline-worsened likely due to  IV fluid hydration-required 2 units of PRBC transfusion.  No overt GI bleeding apparent even though FOBT positive.  Resume Eliquis.  Appreciate  GI input.    Chronic atrial fibrillation now in RVR: We will resume Eliquis today.  Rate control much better on moderate to high-dose oral Cardizem along with as needed IV Cardizem as needed.  Echo pending.  TSH stable.  ?Dementia/cognitive dysfunction at baseline: Confused-expect worsening delirium during this hospital stay.  Goals of care: Unfortunate 85 year old with some amount of baseline dementia-now with severe encephalopathy in the setting of hypercalcemia/AKI/hypernatremia and aspiration pneumonia.  Given elevation in vitamin D3 levels-concerned that he may have underlying lymphoma/sarcoidosis.  He is extremely frail-and is really not a good candidate for aggressive care.  He unfortunately does not have any family-I have spoken with his contact person who is his pastor-[name]- DNR, Med Rx.     Nutrition Status: Nutrition Problem: Severe Malnutrition Etiology: chronic illness (dementia) Signs/Symptoms: percent weight loss, severe fat depletion, severe muscle depletion Percent weight loss: 11.4 % Interventions: Ensure Enlive (each supplement provides 350kcal and 20 grams of protein), MVI    Procedures: None Consults: GI DVT Prophylaxis:  Code Status: DNR Family Communication: Lillard Anes (contact person/pastor) [phone] on 07/02/21 - DNR, Med Rx.   Message left on 07/04/2021 at 12:35 PM for repeat discussion.  Time spent: 35 minutes-Greater than 50% of this time was spent in counseling, explanation of diagnosis, planning of further management, and coordination of care.   Disposition Plan: Status is: Inpatient  Remains inpatient appropriate because: Altered mental status not yet at baseline-hypercalcemia/AKI-on IV fluids/calcitonin injections.  Not stable for discharge.   Diet: Diet Order             DIET DYS 2 Room service appropriate? Yes; Fluid consistency: Thin  Diet effective now                     Antimicrobial agents: Anti-infectives  (From admission, onward)    Start     Dose/Rate Route Frequency Ordered Stop   07/03/21 1000  cephALEXin (KEFLEX) capsule 500 mg  Status:  Discontinued        500 mg Oral Every 12 hours 07/03/21 0748 07/04/21 1032   06/27/21 1800  azithromycin (ZITHROMAX) 500 mg in sodium chloride 0.9 % 250 mL IVPB        500 mg 250 mL/hr over 60 Minutes Intravenous Every 24 hours 06/26/21 1952 07/01/21 2039   06/27/21 1600  cefTRIAXone (ROCEPHIN) 2 g in sodium chloride 0.9 % 100 mL IVPB        2 g 200 mL/hr over 30 Minutes Intravenous Every 24 hours 06/26/21 1952 07/01/21 1855   06/26/21 1430  cefTRIAXone (ROCEPHIN) 2 g in sodium chloride 0.9 % 100 mL IVPB        2 g 200 mL/hr over 30 Minutes Intravenous  Once 06/26/21 1427 06/26/21 1700   06/26/21 1430  azithromycin (ZITHROMAX) 500 mg in sodium chloride 0.9 % 250 mL IVPB        500 mg 250 mL/hr over 60 Minutes Intravenous  Once 06/26/21 1427 06/26/21 1722        MEDICATIONS: Scheduled Meds:  acetaminophen  1,000 mg Oral TID   apixaban  2.5 mg Oral BID   Chlorhexidine Gluconate Cloth  6 each Topical Daily   dexamethasone (DECADRON) injection  6 mg Intravenous Q24H   diltiazem  120 mg Oral Q12H   feeding supplement  237 mL Oral TID  BM   multivitamin with minerals  1 tablet Oral Daily   pantoprazole  40 mg Oral Q1200   sodium bicarbonate  1,300 mg Oral BID   tamsulosin  0.4 mg Oral Daily   Continuous Infusions:  dextrose 125 mL/hr at 07/04/21 0843    PRN Meds:.albuterol, diltiazem, haloperidol lactate, ondansetron **OR** ondansetron (ZOFRAN) IV   I have personally reviewed following labs and imaging studies  LABORATORY DATA:  Recent Labs  Lab 06/28/21 0725 06/29/21 0109 07/02/21 0558 07/03/21 0037 07/04/21 0108  WBC 9.6 9.1 9.0 7.9 10.1  HGB 11.8* 10.6* 10.2* 9.0* 10.1*  HCT 34.5* 31.7* 31.5* 27.1* 30.7*  PLT 287 312 266 246 255  MCV 87.3 87.6 90.0 87.7 89.5  MCH 29.9 29.3 29.1 29.1 29.4  MCHC 34.2 33.4 32.4 33.2 32.9  RDW  16.1* 16.3* 17.7* 17.3* 18.0*  LYMPHSABS  --   --  0.7 0.6* 0.6*  MONOABS  --   --  0.6 0.4 0.3  EOSABS  --   --  0.1 0.0 0.0  BASOSABS  --   --  0.0 0.0 0.0    Recent Labs  Lab 06/29/21 0109 06/30/21 0120 07/01/21 0341 07/02/21 0558 07/03/21 0037 07/04/21 0108  NA 147* 151* 149* 148* 145 148*  K 3.0* 3.4* 2.7* 3.4* 3.6 3.6  CL 125* 128* 128* 127* 125* 125*  CO2 15* 14* 14* 14* 14* 14*  GLUCOSE 93 107* 111* 113* 178* 125*  BUN 39* 41* 45* 42* 44* 48*  CREATININE 2.68* 2.81* 2.68* 2.73* 2.70* 2.69*  CALCIUM 10.3 10.9* 10.9* 10.5* 10.2 10.3  AST 25  --  23 29 18 21   ALT 15  --  20 17 18 21   ALKPHOS 57  --  65 54 52 63  BILITOT 0.6  --  0.9 0.2* 0.5 0.7  ALBUMIN 2.3*  --  2.3* 2.2* 2.1* 2.4*  MG 1.6* 2.1 1.8 1.8 1.9 2.0  BNP  --   --   --  670.1* 663.1* 468.3*   Lab Results  Component Value Date   TSH 0.590 06/27/2021         RADIOLOGY STUDIES/RESULTS: US RENAL  Result Date: 07/03/2021 CLINICAL DATA:  Renal dysfunction EXAM: RENAL / URINARY TRACT ULTRASOUND COMPLETE COMPARISON:  None. FINDINGS: According to the note by the technologist, examination was technically difficult due to the patient's inability to cooperate adequately for optimal positioning. Right Kidney: Renal measurements: 8.8 x 606 x 5.5 cm = volume: 167 mL. There is no hydronephrosis. There are multiple hyperechoic structures in right kidney largest measuring 2.3 cm. There is increased cortical echogenicity and cortical thinning. There is no hydronephrosis. Left Kidney: Renal measurements: 8.3 x 5.7 x 4.6 cm = volume: 113 mL. There is no hydronephrosis. Left renal stones are seen largest measuring 10 mm. There is no perinephric fluid collection. Bladder: Urinary bladder is unremarkable. Ureteral jets were not observed during the study. Other: None. IMPRESSION: There is no hydronephrosis.  Bilateral renal stones. Electronically Signed   By: Elmer Picker M.D.   On: 07/03/2021 12:08   DG Chest Port 1  View  Result Date: 07/04/2021 CLINICAL DATA:  Shortness of breath. EXAM: PORTABLE CHEST 1 VIEW COMPARISON:  07/01/2021 FINDINGS: 0809 hours. The cardio pericardial silhouette is enlarged. Right base collapse/consolidation again noted with small moderate right pleural effusion, similar to prior. Patchy airspace disease in the left base is not substantially changed. The visualized bony structures of the thorax show no acute abnormality. Skin folds noted over  both lower lungs. Telemetry leads overlie the chest. IMPRESSION: No substantial change in exam. Right base collapse/consolidation with small moderate right pleural effusion. Electronically Signed   By: Misty Stanley M.D.   On: 07/04/2021 10:47     LOS: 8 days   Signature  Lala Lund M.D on 07/04/2021 at 12:36 PM   -  To page go to [email or web address]

## 2021-07-04 NOTE — TOC Progression Note (Signed)
Transition of Care [facility]) - Progression Note    Patient Details  Name: Alfred Mercado MRN: [id number] Date of Birth: 25-Apr-1936  [facility] CM/SW Contact  Mearl Latin, LCSW Phone Number: 07/04/2021, 1:54 PM  Clinical Narrative:    CSW received consult from Palliative for residential hospice at either [facility] depending on availability. CSW sent Epic referral to [facility] with [facility] for review.    Expected Discharge Plan: Hospice Medical Facility Barriers to Discharge: Continued Medical Work up  Expected Discharge Plan and Services Expected Discharge Plan: Hospice Medical Facility In-house Referral: Clinical Social Work   Post Acute Care Choice: Skilled Nursing Facility Living arrangements for the past 2 months: Skilled Nursing Facility                                       Social Determinants of Health (SDOH) Interventions    Readmission Risk Interventions Readmission Risk Prevention Plan 06/28/2021 03/17/2021  Transportation Screening Complete Complete  Medication Review [employer]) Complete Referral to Pharmacy  PCP or Specialist appointment within 3-5 days of discharge Complete Complete  HRI or Home Care Consult Complete Complete  SW Recovery Care/Counseling Consult Complete Complete  Palliative Care Screening Complete Not Applicable  Skilled Nursing Facility Complete Complete  Some recent data might be hidden

## 2021-07-04 NOTE — Progress Notes (Addendum)
Daily Progress Note   Patient Name: Alfred Mercado       Date: 07/04/2021 DOB: [birth year]  Age: 85 y.o. MRN#: [id number] Attending Physician: Thurnell Lose, MD Primary Care Physician: Raymondo Band, MD Admit Date: 06/26/2021  Reason for Consultation/Follow-up: Establishing goals of care  Subjective: When I ask patient how he feels today he just shakes his head no. Nonverbal. Audible gurgling in airway. Breathing appears slightly labored. Does not respond to my questions about pain or shortness of breath. Appears more pale and thin than when I saw him last week. Food tray at bedside with a few bites taken. Per RN, very poor intake.   Length of Stay: 8  Current Medications: Scheduled Meds:   acetaminophen  1,000 mg Oral TID   apixaban  2.5 mg Oral BID   Chlorhexidine Gluconate Cloth  6 each Topical Daily   dexamethasone (DECADRON) injection  6 mg Intravenous Q24H   diltiazem  120 mg Oral Q12H   feeding supplement  237 mL Oral TID BM   multivitamin with minerals  1 tablet Oral Daily   pantoprazole  40 mg Oral Q1200   sodium bicarbonate  1,300 mg Oral BID   tamsulosin  0.4 mg Oral Daily    Continuous Infusions:  dextrose 125 mL/hr at 07/04/21 0843    PRN Meds: albuterol, diltiazem, haloperidol lactate, ondansetron **OR** ondansetron (ZOFRAN) IV  Physical Exam Constitutional:      Appearance: He is ill-appearing.     Comments: Appears frail Speech garbled    Cardiovascular:     Rate and Rhythm: Normal rate. Rhythm irregular.  Pulmonary:     Comments: Slightly labored Musculoskeletal:     Right lower leg: No edema.     Left lower leg: No edema.  Skin:    General: Skin is warm and dry.  Neurological:     Mental Status: He is alert. He is disoriented.  Psychiatric:         Cognition and Memory: Cognition is impaired. Memory is impaired.        Judgment: Judgment is inappropriate.            Vital Signs: BP 90/63 (BP Location: Right Arm)   Pulse 77   Temp (!) 97.5 F (36.4 C) (Axillary)   Resp 19   Ht 6\' 1"  (1.854 m)   Wt 67.6 kg   SpO2 100%   BMI 19.66 kg/m  SpO2: SpO2: 100 % O2 Device: O2 Device: Room Air O2 Flow Rate: O2 Flow Rate (L/min): 2 L/min  Intake/output summary:  Intake/Output Summary (Last 24 hours) at 07/04/2021 1325 Last data filed at 07/04/2021 [id number] Gross per 24 hour  Intake --  Output 750 ml  Net -750 ml    LBM: Last BM Date: 07/02/21 Baseline Weight: Weight: 72.6 kg Most recent weight: Weight: 67.6 kg       Palliative Assessment/Data: PPS 20%    Flowsheet Rows    Flowsheet Row Most Recent Value  Intake Tab   Referral Department Hospitalist  Unit at Time of Referral ER  Palliative Care Primary Diagnosis Sepsis/Infectious Disease  Date Notified 06/26/21  Palliative Care Type New Palliative care  Reason for referral Clarify  Goals of Care  Date of Admission 06/26/21  Date first seen by Palliative Care 06/28/21  # of days Palliative referral response time 2 Day(s)  # of days IP prior to Palliative referral 0  Clinical Assessment   Psychosocial & Spiritual Assessment   Palliative Care Outcomes        Patient Active Problem List   Diagnosis Date Noted   Protein-calorie malnutrition, severe 06/30/2021   Delirium    CAP (community acquired pneumonia) 06/26/2021   COVID-19 virus infection 06/26/2021   Occult blood positive stool 06/26/2021   Hypercalcemia 06/26/2021   Elevated troponin 06/26/2021   Malnutrition of moderate degree 03/14/2021   Patient incapable of making informed decisions    Somnolence    Sepsis (HCC) 03/02/2021   Cellulitis 03/02/2021   Hyponatremia 03/02/2021   Renal failure    Hypotension 02/09/2021   Falls 02/09/2021   Syncope and collapse 02/09/2021   Severe sepsis (HCC) 01/25/2021    Iron deficiency anemia 01/25/2021   Generalized weakness    Acute-on-chronic kidney injury (HCC) 01/24/2021   Complicated UTI (urinary tract infection) 01/24/2021   Renal mass, left 01/24/2021   Staghorn renal calculus 01/24/2021   Microscopic hematuria 01/24/2021   Aortic aneurysm (HCC) 01/24/2021   Closed burst fracture of lumbar vertebra (HCC) 01/24/2021   Acute respiratory failure with hypoxia (HCC) 08/13/2020   UTI (urinary tract infection) 06/02/2020   Community acquired pneumonia of right lower lobe of lung 06/02/2020   Acute hypoxemic respiratory failure (HCC) 06/02/2020   Hypokalemia 06/02/2020   Acute diastolic CHF (congestive heart failure) (HCC) 02/06/2020   Exertional shortness of breath 10/14/2019   Atrial fibrillation with RVR (HCC) 10/14/2019   Pleural effusion    Chronic heart failure with preserved ejection fraction [facility])     Palliative Care Assessment & Plan   HPI: 85 y.o. male  with past medical history of dementia, PAF on Eliquis, heart failure, pleural effusions, COPD, CKD. and staghorn calculi admitted on 06/26/2021 with AMS. Recently diagnosed with pneumonia at his SNF. Also have to have AKI and hypercalcemia. Hypercalcemia continues despite treatment and vitamin d3 also elevated.  CT chest/abdomen without any signs of lymphadenopathy. Diagnosed with Covid-19 2 weeks ago. No active infection now. PMT consulted to discuss GOC.   Assessment: Patient appears worse today - respiratory status has worsened. Appears more frail and pale than last week. PO intake quite poor. Refusing some meds.  Notes reviewed - patient unable to participate in therapy. Per PT assessment, patient does not demonstrate the ability to make substantial progress with therapies.  Call to patient's friend/pastor/advocate however no answer - voicemail left with call back number.   It is my opinion that patient is not a good candidate for aggressive medical therapy. Would support a  transition to comfort measures only. Likely eligible to go to hospice facility vs return to LTC facility with hospice support.   Recommendations/Plan: Patient declining - decision maker did not answer Would recommend consideration of comfort measures only with dc to hospice facility vs LTC facility with hospice support  Addendum: Called back by patient's friend/pastor/advocate - discussed above situation and shared my recommendations. All questions and concerns addressed. Mr. Greer Pickerel shares his wife has visited patient several days in a row and also noted a decline in patient. Mr. Greer Pickerel agrees that a transition to comfort measures only is appropriate. We discuss LTC facility with hospice vs hospice facility. Mr. Greer Pickerel has not had a good experience with LTC facility and would prefer  Mr. Donzetta Sprung go to hospice facility - he shares that the [place] facility or the [place] facility would be closest to him and prefers one of them.   Shared plans for comfort care and transfer to hospice facility with RN, Dr. Candiss Norse, and [facility] team. Will change orders to reflect comfort measures only. Will add PRNs to assist with Mr. Letta Moynahan symptoms and comfort. Dc to facility when bed available.  Discharge Planning: [place] with Hospice vs hospice facility  Care plan was discussed with RN  Thank you for allowing the Palliative Medicine Team to assist in the care of this patient.   Total Time 40 minutes Prolonged Time Billed  no       Greater than 50%  of this time was spent counseling and coordinating care related to the above assessment and plan.  Juel Burrow, DNP, [facility] Palliative Medicine Team Team Phone # [phone]  Pager [phone]

## 2021-07-04 NOTE — Progress Notes (Signed)
Civil engineer, contracting [facility]) Hospital Liaison Note:  Received request from [facility] for interest in one of [facility] inpatient residential hospice facilities - [facility] in [place] or [facility] in [place].  Chart reviewed and eligibility pending at this time. Unfortunately [facility] is not able to offer a room today but will continue to follow patient and update TOC on bed availability daily.  Patient is confused/disoriented and is not able to make decisions for himself and does not have a POA/legal guardian to make decisions for him. Spoke with patient advocate/pastor/friend listed on chart to discuss situation and he is not interested/able to make decisions for patient at this time. Made TOC aware that [facility] would need a legal guardians to ensure patient wishes are supported and prior to admitting to our services. TOC will follow up with [facility] once this issue has been resolved. [facility] leadership aware of situation as well.  Please do not hesitate to call with questions.  Thank you.   Roda Shutters, RN [facility] Liaison  [phone] [place] are on [place]

## 2021-07-04 NOTE — Progress Notes (Signed)
TRH night cross cover note:  Patient with chronic atrial fibrillation, with heart rates in the 120's - 130's this AM after refusing his scheduled evening oral diltiazem. HR currently in low 120's following dose of prn IV Dilt. BP stable, most recently 113/66. Other VS appear stable. I asked RN to try to encourage pt to take his scheduled PO Dilt, next dose now, as primary next intervention.      Alfred Pigg, DO Hospitalist

## 2021-07-04 NOTE — Consult Note (Signed)
   [facility] Inpatient Consult   07/04/2021  Alfred Mercado 1936-05-27 [id number]  [facility] [[facility]]  Accountable Care Organization [ACO] Patient: Medicare CMS DCE  Primary Care Provider:  Karna Dupes, MD, is not a [facility] provider noted as recent change on 06/26/21 listed with [facility]    Patient screened for length of stay, 4 hospitalizations in 6 months with noted extreme high risk score for unplanned readmission risk. and to assess for post hospital transition.  Review of patient's medical record reveals patient is long term care SNF per inpatient [facility] notes   Plan:  Currently, patient is for residential hospice transition. Will sign off.    For questions contact:   Charlesetta Shanks, RN BSN CCM [facility]  [phone] business mobile phone Toll free office [phone]  Fax number: [phone] [email or web address]

## 2021-07-04 NOTE — Progress Notes (Signed)
Occupational Therapy Treatment Patient Details Name: Alfred Mercado MRN: [id number] DOB: 1935-09-23 Today's Date: 07/04/2021   History of present illness Pt is a 85 yr old male who presented to [facility] hospital on 06/26/2021 due to acute encephalopathy. Of note pt also with recent diagnosis of COVID-19 and PNA. PMH of sepsis, diastolic CHF, afib, dementia, COVID, CKD.   OT comments  Patient seen by skilled OT to address self feeding.  Patient was provided setup for self feeding and required hand over hand to feed self and manage cups. Patient was able to manage utensils on few occasion but continued to require assistance to reach mouth.  Patient demonstrated wet gurgally while breathing and coughed on occasion with then liquids.  Nursing was notified.  Acute OT to continue to follow.    Recommendations for follow up therapy are one component of a multi-disciplinary discharge planning process, led by the attending physician.  Recommendations may be updated based on patient status, additional functional criteria and insurance authorization.    Follow Up Recommendations  Skilled nursing-short term rehab (<3 hours/day)    Assistance Recommended at Discharge Frequent or constant Supervision/Assistance  Equipment Recommendations  None recommended by OT    Recommendations for Other Services      Precautions / Restrictions Precautions Precautions: Fall Precaution Comments: monitor HR, tachy, afib Restrictions Weight Bearing Restrictions: No       Mobility Bed Mobility                    Transfers                         Balance                                           ADL either performed or assessed with clinical judgement   ADL Overall ADL's : Needs assistance/impaired Eating/Feeding: Maximal assistance;Cueing for safety;Cueing for sequencing;Sitting Eating/Feeding Details (indicate cue type and reason): 2-3 instances of patient using utentsil  but required assistance to reach mouth. Hand over hand all other instances                                   General ADL Comments: self feeding at bedlevel    Extremity/Trunk Assessment Upper Extremity Assessment Upper Extremity Assessment: Defer to OT evaluation            Vision       Perception     Praxis      Cognition Arousal/Alertness: Awake/alert Behavior During Therapy: Anxious Overall Cognitive Status: Difficult to assess                                 General Comments: mumbled speech          Exercises     Shoulder Instructions       General Comments      Pertinent Vitals/ Pain       Pain Assessment: Faces Faces Pain Scale: No hurt  Home Living  Prior Functioning/Environment              Frequency  Min 2X/week        Progress Toward Goals  OT Goals(current goals can now be found in the care plan section)  Progress towards OT goals: Not progressing toward goals - comment  Acute Rehab OT Goals OT Goal Formulation: Patient unable to participate in goal setting Time For Goal Achievement: 07/22/21 Potential to Achieve Goals: Fair ADL Goals Pt Will Perform Eating: with set-up;sitting Pt Will Perform Grooming: with min assist;sitting Pt Will Perform Upper Body Bathing: with mod assist;sitting Pt Will Perform Lower Body Bathing: with max assist;sit to/from stand  Plan Discharge plan remains appropriate    Co-evaluation                 AM-PAC OT "6 Clicks" Daily Activity     Outcome Measure   Help from another person eating meals?: Total Help from another person taking care of personal grooming?: Total Help from another person toileting, which includes using toliet, bedpan, or urinal?: Total Help from another person bathing (including washing, rinsing, drying)?: Total Help from another person to put on and taking off regular upper body  clothing?: Total Help from another person to put on and taking off regular lower body clothing?: Total 6 Click Score: 6    End of Session    OT Visit Diagnosis: Unsteadiness on feet (R26.81);Other abnormalities of gait and mobility (R26.89);Repeated falls (R29.6);Muscle weakness (generalized) (M62.81);Pain   Activity Tolerance Patient tolerated treatment well   Patient Left in bed;with call bell/phone within reach;with bed alarm set   Nurse Communication Other (comment) (on amount patient consumed and participation)        Time: 8453-6468 OT Time Calculation (min): 24 min  Charges: OT General Charges $OT Visit: 1 Visit OT Treatments $Self Care/Home Management : 23-37 mins  Alfonse Flavors, OTA Acute Rehabilitation Services  Pager [phone] Office [phone]   Dewain Penning 07/04/2021, 2:50 PM

## 2021-07-04 NOTE — Progress Notes (Signed)
Nutrition Brief Note  Chart reviewed. Pt continues with very poor PO intake and decline in status. Pt now transitioning to comfort care and will be transferred to a hospice facility when bed available.   No further nutrition interventions planned at this time.  Please re-consult as needed.   Kirby Crigler BS, PLDN Clinical Dietitian See [facility] for contact information.

## 2021-07-05 DIAGNOSIS — R0602 Shortness of breath: Secondary | ICD-10-CM

## 2021-07-05 DIAGNOSIS — N179 Acute kidney failure, unspecified: Secondary | ICD-10-CM | POA: Diagnosis not present

## 2021-07-05 NOTE — Progress Notes (Signed)
SLP Cancellation Note  Patient Details Name: Alfred Mercado MRN: [id number] DOB: May 24, 1936   Cancelled treatment:       Reason Eval/Treat Not Completed: SLP screened, no needs identified, will sign off ([name] has been transitioned to comfort care. His case was discussed with the referring MD, and it was agreed that the swallow evaluation be cancelled.)  Mikenna Bunkley I. Vear Clock, MS, CCC-SLP Acute Rehabilitation Services Office number [phone] Pager [phone]  Scheryl Marten 07/05/2021, 9:47 AM

## 2021-07-05 NOTE — Progress Notes (Addendum)
Civil engineer, contracting [facility]) Hospital Liaison Note:  Chart reviewed and eligibility confirmed for [facility].  Discussed with TOC who confirmed caregiver's interest in [facility].  This liaison unable to reach caregiver to discuss possible transfer.  Please do not hesitate to call with questions.  Thank you.   Gillian Scarce, BSN, RN [employer]  [phone]

## 2021-07-05 NOTE — TOC Progression Note (Signed)
Transition of Care [facility]) - Progression Note    Patient Details  Name: TOD ABRAHAMSEN MRN: [id number] Date of Birth: 07/01/1936  Transition of Care [facility]) CM/SW Contact  Mearl Latin, LCSW Phone Number: 07/05/2021, 2:02 PM  Clinical Narrative:    CSW spoke with patient's advocate, Leonette Most and made him aware that patient would have to return to [facility] with comfort medications since he has no one to sign hospice paperwork for him. Leonette Most stated he prefers patient to go to hospice instead and that he is willing to review the hospice paperwork if it can be emailed to him. CSW updated [facility] liaison for review.     Expected Discharge Plan: Hospice Medical Facility Barriers to Discharge: Continued Medical Work up  Expected Discharge Plan and Services Expected Discharge Plan: Hospice Medical Facility In-house Referral: Clinical Social Work   Post Acute Care Choice: Skilled Nursing Facility Living arrangements for the past 2 months: Skilled Nursing Facility                                       Social Determinants of Health (SDOH) Interventions    Readmission Risk Interventions Readmission Risk Prevention Plan 06/28/2021 03/17/2021  Transportation Screening Complete Complete  Medication Review [employer]) Complete Referral to Pharmacy  PCP or Specialist appointment within 3-5 days of discharge Complete Complete  HRI or Home Care Consult Complete Complete  SW Recovery Care/Counseling Consult Complete Complete  Palliative Care Screening Complete Not Applicable  Skilled Nursing Facility Complete Complete  Some recent data might be hidden

## 2021-07-05 NOTE — Progress Notes (Signed)
PROGRESS NOTE        PATIENT DETAILS Name: Alfred Mercado Age: 85 y.o. Sex: male Date of Birth: 1936/08/23 Admit Date: 06/26/2021 Admitting Physician Toy Baker, MD [id number], Rozanna Boer, MD  Brief Narrative:  Patient is a 85 y.o. male with history of dementia, PAF on Eliquis, staghorn calculi-who was recently diagnosed with pneumonia at his nursing facility presented with altered mental status-upon further evaluation he was found to have hypercalcemia, AKI.  See below for further details.   Pertinent Tests:    11/1>> PTH: 9 (low) 11/1>> 25 (oh) vitamin D: 63.9 (normal) 11/1>> 1, 2 (OH) vitamin D: 95.6 (high) 11/2>> PTHrP: Pending  10/31>>Blood culture: No growth 10/31>> urine culture: No growth  10/31>>CXR: Chronic unchanged right-sided pleural effusion. 10/31>> CT head: No acute intracranial pathology, stable left frontotemporal hygroma. 10/31>> CT renal stone study: No acute abnormality, multiple large bilateral renal calculi as previously demonstrated. 11/3>> CT chest: No findings to suggest lymphoma/sarcoidosis, right upper/left lower lobe bronchopneumonia, moderate chronic right pleural effusion.  Underlying COPD.    Subjective:  Patient in bed, appears very weak, confused , unable to answer questions.   Objective:  Vitals: Blood pressure (!) 79/56, pulse 100, temperature (!) 97.5 F (36.4 C), temperature source Axillary, resp. rate 14, height 6\' 1"  (1.854 m), weight 67.6 kg, SpO2 98 %.   Exam:  Awake but confused, appears very weak and frail, has some upper airway gurgling, [place].AT,PERRAL Supple Neck, No JVD,   Symmetrical Chest wall movement, Good air movement bilaterally, CTAB RRR,No Gallops, Rubs or new Murmurs,  +ve B.Sounds, Abd Soft, No tenderness,   No Cyanosis, Clubbing or edema      Assessment/Plan:  85 year old gentleman admitted with severe acute metabolic encephalopathy due to hypercalcemia of unclear  etiology however at his age malignancy would be high up in the differential, he was severely dehydrated with AKI, extremely frail and cachectic at baseline.  Despite appropriate care patient made marginal improvement and eventually started to aspirate food and oral secretions, discussions were made between the treatment team which included palliative care team and patient's POA, he has now been transitioned to full comfort care and is awaiting hospice bed.  All nonessential medications have been stopped goal of care is comfort.    Other medical issues addressed earlier this admission are below.  Acute metabolic encephalopathy: Due to AKI/hypercalcemia-superimposed with dementia at baseline.  CT head nonacute, moving all 4 extremities without any focal deficits, continue treatment for hypercalcemia.  AKI on CKD stage IIIb: AKI multifactorial-hemodynamically mediated and from hypercalcemia.  UA without proteinuria, renal CT without hydronephrosis, repeat renal ultrasound nonacute on 07/03/2021.  Continue treatment for hypercalcemia.  Hypercalcemia: is unclear but likely due to underlying malignancy or sarcoid, ACE level and PTH RP levels are pending, continue with aggressive IV fluids with hydration, has already received [place] calcitonin, 1 dose of Zometa.  Will monitor closely.  Hypernatremia: Due to extreme dehydration, continue D5W as needed.  Hyperchloremia.  Due to normal saline and LR infusion.  Now that dehydration has resolved.  Stop further IV fluids and monitor. ? RTA.  Metabolic acidosis: Likely starvation ketoacidosis and from AKI.  Starting oral bicarb supplementation-follow closely.  Probable aspiration pneumonia: Afebrile-CT chest as above-we will plan on at least 5 days of empiric antimicrobial therapy.  He has developed more upper airway gurgling on 07/04/2021 will reinvolve  speech, elevate head of the bed and monitor.  Poor prognosis if he is aspirating repeatedly.  Asymptomatic  bacteriuria: UA grossly abnormal-unable to obtain history to see if he has symptoms consistent with UTI (demented/agitated).  Thankfully urine culture negative-in any event already on IV antibiotics.    COVID-19 infection: Per ED notes and H&P-diagnosed 2 weeks ago-does not appear to have any active infection at this point.  No need for isolation.  Normocytic anemia: Suspect has a combination of iron deficiency and anemia of chronic disease at baseline-worsened likely due to IV fluid hydration-required 2 units of PRBC transfusion.  No overt GI bleeding apparent even though FOBT positive.  Resume Eliquis.  Appreciate GI input.    Chronic atrial fibrillation now in RVR: We will resume Eliquis today.  Rate control much better on moderate to high-dose oral Cardizem along with as needed IV Cardizem as needed.  Echo pending.  TSH stable.  ?Dementia/cognitive dysfunction at baseline: Confused-expect worsening delirium during this hospital stay.  Goals of care: Unfortunate 85 year old with some amount of baseline dementia-now with severe encephalopathy in the setting of hypercalcemia/AKI/hypernatremia and aspiration pneumonia.  Given elevation in vitamin D3 levels-concerned that he may have underlying lymphoma/sarcoidosis.  He is extremely frail-and is really not a good candidate for aggressive care.  He unfortunately does not have any family-I have spoken with his contact person who is his pastor-[name]- DNR, Med Rx.     Nutrition Status: Nutrition Problem: Severe Malnutrition Etiology: chronic illness (dementia) Signs/Symptoms: percent weight loss, severe fat depletion, severe muscle depletion Percent weight loss: 11.4 % Interventions: Ensure Enlive (each supplement provides 350kcal and 20 grams of protein), MVI    Procedures: None Consults: GI DVT Prophylaxis:  Code Status: DNR Family Communication: Lillard Anes (contact person/pastor) [phone] on 07/02/21 - DNR, Med Rx.    Message left on 07/04/2021 at 12:35 PM for repeat discussion.  Time spent: 35 minutes-Greater than 50% of this time was spent in counseling, explanation of diagnosis, planning of further management, and coordination of care.   Disposition Plan: Status is: Inpatient  Remains inpatient appropriate because: Altered mental status not yet at baseline-hypercalcemia/AKI-on IV fluids/calcitonin injections.  Not stable for discharge.   Diet: Diet Order             DIET DYS 2 Room service appropriate? Yes; Fluid consistency: Thin  Diet effective now                     Antimicrobial agents: Anti-infectives (From admission, onward)    Start     Dose/Rate Route Frequency Ordered Stop   07/03/21 1000  cephALEXin (KEFLEX) capsule 500 mg  Status:  Discontinued        500 mg Oral Every 12 hours 07/03/21 0748 07/04/21 1032   06/27/21 1800  azithromycin (ZITHROMAX) 500 mg in sodium chloride 0.9 % 250 mL IVPB        500 mg 250 mL/hr over 60 Minutes Intravenous Every 24 hours 06/26/21 1952 07/01/21 2039   06/27/21 1600  cefTRIAXone (ROCEPHIN) 2 g in sodium chloride 0.9 % 100 mL IVPB        2 g 200 mL/hr over 30 Minutes Intravenous Every 24 hours 06/26/21 1952 07/01/21 1855   06/26/21 1430  cefTRIAXone (ROCEPHIN) 2 g in sodium chloride 0.9 % 100 mL IVPB        2 g 200 mL/hr over 30 Minutes Intravenous  Once 06/26/21 1427 06/26/21 1700   06/26/21 1430  azithromycin (  ZITHROMAX) 500 mg in sodium chloride 0.9 % 250 mL IVPB        500 mg 250 mL/hr over 60 Minutes Intravenous  Once 06/26/21 1427 06/26/21 1722        MEDICATIONS: Scheduled Meds:  acetaminophen  1,000 mg Oral TID   diltiazem  120 mg Oral Q12H   feeding supplement  237 mL Oral TID BM   tamsulosin  0.4 mg Oral Daily   Continuous Infusions:    PRN Meds:.albuterol, antiseptic oral rinse, glycopyrrolate **OR** glycopyrrolate **OR** glycopyrrolate, haloperidol lactate, HYDROmorphone (DILAUDID) injection, LORazepam **OR**  LORazepam **OR** LORazepam, ondansetron **OR** ondansetron (ZOFRAN) IV, polyvinyl alcohol   I have personally reviewed following labs and imaging studies  LABORATORY DATA:  Recent Labs  Lab 06/29/21 0109 07/02/21 0558 07/03/21 0037 07/04/21 0108  WBC 9.1 9.0 7.9 10.1  HGB 10.6* 10.2* 9.0* 10.1*  HCT 31.7* 31.5* 27.1* 30.7*  PLT 312 266 246 255  MCV 87.6 90.0 87.7 89.5  MCH 29.3 29.1 29.1 29.4  MCHC 33.4 32.4 33.2 32.9  RDW 16.3* 17.7* 17.3* 18.0*  LYMPHSABS  --  0.7 0.6* 0.6*  MONOABS  --  0.6 0.4 0.3  EOSABS  --  0.1 0.0 0.0  BASOSABS  --  0.0 0.0 0.0    Recent Labs  Lab 06/29/21 0109 06/30/21 0120 07/01/21 0341 07/02/21 0558 07/03/21 0037 07/04/21 0108  NA 147* 151* 149* 148* 145 148*  K 3.0* 3.4* 2.7* 3.4* 3.6 3.6  CL 125* 128* 128* 127* 125* 125*  CO2 15* 14* 14* 14* 14* 14*  GLUCOSE 93 107* 111* 113* 178* 125*  BUN 39* 41* 45* 42* 44* 48*  CREATININE 2.68* 2.81* 2.68* 2.73* 2.70* 2.69*  CALCIUM 10.3 10.9* 10.9* 10.5* 10.2 10.3  AST 25  --  23 29 18 21   ALT 15  --  20 17 18 21   ALKPHOS 57  --  65 54 52 63  BILITOT 0.6  --  0.9 0.2* 0.5 0.7  ALBUMIN 2.3*  --  2.3* 2.2* 2.1* 2.4*  MG 1.6* 2.1 1.8 1.8 1.9 2.0  BNP  --   --   --  670.1* 663.1* 468.3*   Lab Results  Component Value Date   TSH 0.590 06/27/2021         RADIOLOGY STUDIES/RESULTS: RENAL  Result Date: 07/03/2021 CLINICAL DATA:  Renal dysfunction EXAM: RENAL / URINARY TRACT ULTRASOUND COMPLETE COMPARISON:  None. FINDINGS: According to the note by the technologist, examination was technically difficult due to the patient's inability to cooperate adequately for optimal positioning. Right Kidney: Renal measurements: 8.8 x 606 x 5.5 cm = volume: 167 mL. There is no hydronephrosis. There are multiple hyperechoic structures in right kidney largest measuring 2.3 cm. There is increased cortical echogenicity and cortical thinning. There is no hydronephrosis. Left Kidney: Renal measurements: 8.3 x 5.7 x  4.6 cm = volume: 113 mL. There is no hydronephrosis. Left renal stones are seen largest measuring 10 mm. There is no perinephric fluid collection. Bladder: Urinary bladder is unremarkable. Ureteral jets were not observed during the study. Other: None. IMPRESSION: There is no hydronephrosis.  Bilateral renal stones. Electronically Signed   By: [place] M.D.   On: 07/03/2021 12:08   DG Chest Port 1 View  Result Date: 07/04/2021 CLINICAL DATA:  Shortness of breath. EXAM: PORTABLE CHEST 1 VIEW COMPARISON:  07/01/2021 FINDINGS: 0809 hours. The cardio pericardial silhouette is enlarged. Right base collapse/consolidation again noted with small moderate right pleural effusion, similar to  prior. Patchy airspace disease in the left base is not substantially changed. The visualized bony structures of the thorax show no acute abnormality. Skin folds noted over both lower lungs. Telemetry leads overlie the chest. IMPRESSION: No substantial change in exam. Right base collapse/consolidation with small moderate right pleural effusion. Electronically Signed   By: Misty Stanley M.D.   On: 07/04/2021 10:47     LOS: 9 days   Signature  Lala Lund M.D on 07/05/2021 at 9:17 AM   -  To page go to [email or web address]

## 2021-07-05 NOTE — Progress Notes (Addendum)
Daily Progress Note   Patient Name: Alfred Mercado       Date: 07/05/2021 DOB: 10/21/1935  Age: 85 y.o. MRN#: [id number] Attending Physician: Singh, Prashant K, MD Primary Care Physician: Blake, Khashana A, MD Admit Date: 06/26/2021 Length of Stay: 9 days  Reason for Consultation/Follow-up: Establishing goals of care  HPI/Patient Profile:  85 y.o. male  with past medical history of dementia, PAF on Eliquis, heart failure, pleural effusions, COPD, CKD. and staghorn calculi admitted on 06/26/2021 with AMS. Recently diagnosed with pneumonia at his SNF. Also have to have AKI and hypercalcemia. Hypercalcemia continues despite treatment and vitamin d3 also elevated.  CT chest/abdomen without any signs of lymphadenopathy. Diagnosed with Covid-19 2 weeks ago. No active infection now.   PMT consulted to discuss GOC.   Subjective:   Subjective: Chart Reviewed. Updates received. Patient Assessed. Created space and opportunity for patient  and family to explore thoughts and feelings regarding current medical situation.  Today's Discussion: Met with the patient at the bedside. He is unable to participate in any meaningful discussion. I called his contact person Alfred Mercado (pastor) to provide an update. Message was left on voicemail for callback.  Review of Systems  Unable to perform ROS: Mental status change   Objective:   Vital Signs:  BP (!) 79/56 (BP Location: Left Arm)   Pulse 100   Temp (!) 97.5 F (36.4 C) (Axillary)   Resp 14   Ht 6' 1" (1.854 m)   Wt 67.6 kg   SpO2 98%   BMI 19.66 kg/m   Physical Exam: Physical Exam Vitals and nursing note reviewed.  Constitutional:      General: He is not in acute distress.    Appearance: He is ill-appearing.  HENT:     Head: Normocephalic and atraumatic.  Cardiovascular:     Rate and Rhythm: Normal rate.  Pulmonary:     Effort: Pulmonary effort is normal.     Comments: Noted excessive secretions with "gurgling" Abdominal:      General: Abdomen is flat.     Palpations: Abdomen is soft.  Skin:    General: Skin is warm and dry.  Neurological:     Mental Status: He is alert. He is disoriented and confused.     Comments: Babbling confusion, not following commands. No apparent distress    Palliative Assessment/Data: 20%   Assessment & Plan:   Impression: Present on Admission:  CAP (community acquired pneumonia)  UTI (urinary tract infection)  Severe sepsis (HCC)  Iron deficiency anemia  Hypotension  Hypokalemia  Complicated UTI (urinary tract infection)  Chronic heart failure with preserved ejection fraction (HCC)  Acute-on-chronic kidney injury (HCC)  COVID-19 virus infection  Occult blood positive stool  Hypercalcemia  Elevated troponin  85-year-old male currently on comfort care and pending placement at residential hospice.  Multiple acute and chronic conditions as described above.  Concern for worsening respiratory status.  Appears very frail today, some gurgling/excessive secretions noted.  The RN was notified and requested a dose of Robinul be given as ordered as needed.  Patient's only contacts are his pastor and his pastor's wife who seem to be making decisions for him.  Some concern on to have a hospice about having a legal guardian or POA to make decisions for him.  TOC is working on this.  Still waiting for residential hospice bed.  Still appears stable for transfer to residential hospice if a bed is made available.  SUMMARY OF RECOMMENDATIONS   Continue   comfort care Continued wait for residential hospice bed RN notified to give due prn dose Robinul RN to notify PMT if not effective at which point we can dose adjust or schedule Robinul PMT will continue to follow  Code Status: DNR  Prognosis: < 2 weeks  Discharge Planning: Hospice facility  Discussed with: Medical team, nursing team, [facility] colleague  Thank you for allowing [place] to participate in the care of SHREY BOIKE PMT will  continue to support holistically.  Time Total: 35 min  Visit consisted of counseling and education dealing with the complex and emotionally intense issues of symptom management and palliative care in the setting of serious and potentially life-threatening illness. Greater than 50%  of this time was spent counseling and coordinating care related to the above assessment and plan.  ADDENDUM: Patient's pastor/contact person returned the call. Provided an update to him on patient's condition and continuing to wait for hospice bed to come available. Appreciated the update. Will continue to follow.  Walden Field, NP Palliative Medicine Team  Team Phone # [phone] (Nights/Weekends)  04/25/2021, 8:17 AM

## 2021-07-06 MED ORDER — HYDROMORPHONE HCL 1 MG/ML IJ SOLN
1.0000 mg | INTRAMUSCULAR | Status: DC | PRN
  Administered 2021-07-06 (×2): 1 mg via INTRAVENOUS
  Filled 2021-07-06 (×2): qty 1

## 2021-07-06 MED ORDER — GLYCOPYRROLATE 0.2 MG/ML IJ SOLN
0.4000 mg | INTRAMUSCULAR | Status: DC
  Administered 2021-07-06 (×2): 0.4 mg via INTRAVENOUS
  Filled 2021-07-06 (×2): qty 2

## 2021-07-06 MED ORDER — DILTIAZEM HCL 120 MG PO TABS: 120.0000 mg | ORAL_TABLET | Freq: Two times a day (BID) | ORAL | Status: AC

## 2021-07-06 MED ORDER — GLYCOPYRROLATE 0.2 MG/ML IJ SOLN
0.4000 mg | Freq: Once | INTRAMUSCULAR | Status: AC
  Administered 2021-07-06: 0.4 mg via INTRAVENOUS
  Filled 2021-07-06: qty 2

## 2021-07-06 NOTE — Progress Notes (Signed)
Daily Progress Note   Patient Name: Alfred Mercado       Date: 07/06/2021 DOB: 07-Apr-1936  Age: 85 y.o. MRN#: [id number] Attending Physician: Thurnell Lose, MD Primary Care Physician: Raymondo Band, MD Admit Date: 06/26/2021 Length of Stay: 10 days  Reason for Consultation/Follow-up: Establishing goals of care and Terminal Care  HPI/Patient Profile:  85 y.o. male  with past medical history of dementia, PAF on Eliquis, heart failure, pleural effusions, COPD, CKD. and staghorn calculi admitted on 06/26/2021 with AMS. Recently diagnosed with pneumonia at his SNF. Also have to have AKI and hypercalcemia. Hypercalcemia continues despite treatment and vitamin d3 also elevated.  CT chest/abdomen without any signs of lymphadenopathy. Diagnosed with Covid-19 2 weeks ago. No active infection now.    PMT consulted to discuss [employer].   Subjective:   Subjective: Chart Reviewed. Updates received. Patient Assessed. Created space and opportunity for patient  and family to explore thoughts and feelings regarding current medical situation.  Today's Discussion: I met with the patient at bedside today.  He is not responsive.  Mild distress with gurgling from excess secretions noted.  Last dose of Dilaudid around 6 AM, last dose of Robinul also around 6 AM.  Review of Systems  Unable to perform ROS: Patient unresponsive   Objective:   Vital Signs:  BP 98/60 (BP Location: Right Arm)   Pulse (!) 120   Temp (!) 97.4 F (36.3 C) (Axillary)   Resp 20   Ht 6' 1" (1.854 m)   Wt 67.6 kg   SpO2 100%   BMI 19.66 kg/m   Physical Exam: Physical Exam Vitals and nursing note reviewed.  Constitutional:      General: He is in acute distress (mild).     Appearance: He is ill-appearing.  HENT:     Head: Normocephalic and atraumatic.  Cardiovascular:     Rate and Rhythm: Normal rate.  Pulmonary:     Effort: Respiratory distress (mild) present.     Breath sounds: Rhonchi present.     Comments:  Noted upper respiratory gurgling Abdominal:     General: Abdomen is flat.     Palpations: Abdomen is soft.  Skin:    General: Skin is warm and dry.  Neurological:     Mental Status: He is unresponsive.    Palliative Assessment/Data: 10%   Assessment & Plan:   Impression: Present on Admission:  CAP (community acquired pneumonia)  UTI (urinary tract infection)  Severe sepsis (HCC)  Iron deficiency anemia  Hypotension  Hypokalemia  Complicated UTI (urinary tract infection)  Chronic heart failure with preserved ejection fraction (HCC)  Acute-on-chronic kidney injury ([place])  COVID-19 virus infection  Occult blood positive stool  Hypercalcemia  Elevated troponin  85 year old male currently on comfort care and pending placement at residential hospice.  Multiple acute and chronic conditions as described above.  Concern for worsening respiratory status.  Appears very frail today, some gurgling/excessive secretions noted.  The RN was notified and requested Alfred dose of Robinul be given as ordered as needed.  Patient's only contacts are his pastor and his pastor's wife who seem to be making decisions for him.  Some concern on to have Alfred hospice about having Alfred legal guardian or POA to make decisions for him.  TOC is working on this.  Still waiting for residential hospice bed.   Still appears stable for transfer to residential hospice if Alfred bed is made available.  SUMMARY OF RECOMMENDATIONS   Continue comfort care Continued  wait for residential hospice bed RN notified to give due prn dose Dilaudid Increase Robinul to 0.4 mg q 4 hours (scheduled) with one-time dose now PMT will continue to follow  Code Status: DNR  Prognosis: < 2 weeks  Discharge Planning: Hospice facility  Discussed with: Medical team, nursing team, patient contact  Thank you for allowing us to participate in the care of Alfred Mercado PMT will continue to support holistically.  Time Total: 35 min  Visit  consisted of counseling and education dealing with the complex and emotionally intense issues of symptom management and palliative care in the setting of serious and potentially life-threatening illness. Greater than 50%  of this time was spent counseling and coordinating care related to the above assessment and plan.   , NP Palliative Medicine Team  Team Phone # [phone] (Nights/Weekends)  04/25/2021, 8:17 AM   

## 2021-07-06 NOTE — Progress Notes (Signed)
PROGRESS NOTE        PATIENT DETAILS Name: Alfred Mercado Age: 85 y.o. Sex: male Date of Birth: 1935/09/04 Admit Date: 06/26/2021 Admitting Physician Toy Baker, MD [id number], Rozanna Boer, MD  Brief Narrative:  Patient is a 85 y.o. male with history of dementia, PAF on Eliquis, staghorn calculi-who was recently diagnosed with pneumonia at his nursing facility presented with altered mental status-upon further evaluation he was found to have hypercalcemia, AKI.  See below for further details.   Pertinent Tests:    11/1>> PTH: 9 (low) 11/1>> 25 (oh) vitamin D: 63.9 (normal) 11/1>> 1, 2 (OH) vitamin D: 95.6 (high) 11/2>> PTHrP: Pending  10/31>>Blood culture: No growth 10/31>> urine culture: No growth  10/31>>CXR: Chronic unchanged right-sided pleural effusion. 10/31>> CT head: No acute intracranial pathology, stable left frontotemporal hygroma. 10/31>> CT renal stone study: No acute abnormality, multiple large bilateral renal calculi as previously demonstrated. 11/3>> CT chest: No findings to suggest lymphoma/sarcoidosis, right upper/left lower lobe bronchopneumonia, moderate chronic right pleural effusion.  Underlying COPD.    Subjective:  Patient in bed, appears very weak, confused , unable to answer questions, appears in mild distress.   Objective:  Vitals: Blood pressure 98/60, pulse (!) 120, temperature (!) 97.4 F (36.3 C), temperature source Axillary, resp. rate 20, height 6\' 1"  (1.854 m), weight 67.6 kg, SpO2 100 %.   Exam:  Awake but confused, appears very weak and frail, has some upper airway gurgling, [place].AT,PERRAL Supple Neck, No JVD,   Symmetrical Chest wall movement, Good air movement bilaterally, CTAB RRR,No Gallops, Rubs or new Murmurs,  +ve B.Sounds, Abd Soft, No tenderness,   No Cyanosis, Clubbing or edema     Assessment/Plan:  85 year old gentleman admitted with severe acute metabolic encephalopathy due to  hypercalcemia of unclear etiology however at his age malignancy would be high up in the differential, he was severely dehydrated with AKI, extremely frail and cachectic at baseline.  Despite appropriate care patient made marginal improvement and eventually started to aspirate food and oral secretions, discussions were made between the treatment team which included palliative care team and patient's POA, he has now been transitioned to full comfort care and is awaiting hospice bed.  All nonessential medications have been stopped goal of care is comfort.  Appears more uncomfortable morning of 07/06/2021, comfort medications adjusted further.  Suction as needed and monitor.    Other medical issues addressed earlier this admission are below.  Acute metabolic encephalopathy: Due to AKI/hypercalcemia-superimposed with dementia at baseline.  CT head nonacute, moving all 4 extremities without any focal deficits, continue treatment for hypercalcemia.  AKI on CKD stage IIIb: AKI multifactorial-hemodynamically mediated and from hypercalcemia.  UA without proteinuria, renal CT without hydronephrosis, repeat renal ultrasound nonacute on 07/03/2021.  Continue treatment for hypercalcemia.  Hypercalcemia: is unclear but likely due to underlying malignancy or sarcoid, ACE level and PTH RP levels are pending, continue with aggressive IV fluids with hydration, has already received Sulman calcitonin, 1 dose of Zometa.  Will monitor closely.  Hypernatremia: Due to extreme dehydration, continue D5W as needed.  Hyperchloremia.  Due to normal saline and LR infusion.  Now that dehydration has resolved.  Stop further IV fluids and monitor. ? RTA.  Metabolic acidosis: Likely starvation ketoacidosis and from AKI.  Starting oral bicarb supplementation-follow closely.  Probable aspiration pneumonia: Afebrile-CT chest as above-we will plan on  at least 5 days of empiric antimicrobial therapy.  He has developed more upper airway  gurgling on 07/04/2021 will reinvolve speech, elevate head of the bed and monitor.  Poor prognosis if he is aspirating repeatedly.  Asymptomatic bacteriuria: UA grossly abnormal-unable to obtain history to see if he has symptoms consistent with UTI (demented/agitated).  Thankfully urine culture negative-in any event already on IV antibiotics.    COVID-19 infection: Per ED notes and H&P-diagnosed 2 weeks ago-does not appear to have any active infection at this point.  No need for isolation.  Normocytic anemia: Suspect has a combination of iron deficiency and anemia of chronic disease at baseline-worsened likely due to IV fluid hydration-required 2 units of PRBC transfusion.  No overt GI bleeding apparent even though FOBT positive.  Resume Eliquis.  Appreciate GI input.    Chronic atrial fibrillation now in RVR: We will resume Eliquis today.  Rate control much better on moderate to high-dose oral Cardizem along with as needed IV Cardizem as needed.  Echo pending.  TSH stable.  ?Dementia/cognitive dysfunction at baseline: Confused-expect worsening delirium during this hospital stay.  Goals of care: Unfortunate 85 year old with some amount of baseline dementia-now with severe encephalopathy in the setting of hypercalcemia/AKI/hypernatremia and aspiration pneumonia.  Given elevation in vitamin D3 levels-concerned that he may have underlying lymphoma/sarcoidosis.  He is extremely frail-and is really not a good candidate for aggressive care.  He unfortunately does not have any family-I have spoken with his contact person who is his pastor-[name]- DNR, Med Rx.     Nutrition Status: Nutrition Problem: Severe Malnutrition Etiology: chronic illness (dementia) Signs/Symptoms: percent weight loss, severe fat depletion, severe muscle depletion Percent weight loss: 11.4 % Interventions: Ensure Enlive (each supplement provides 350kcal and 20 grams of protein), MVI    Procedures: None Consults:  GI DVT Prophylaxis:  Code Status: DNR Family Communication: Lillard Anes (contact person/pastor) [phone] on 07/02/21 - DNR, Med Rx.   Message left on 07/04/2021 at 12:35 PM for repeat discussion.  Time spent: 35 minutes-Greater than 50% of this time was spent in counseling, explanation of diagnosis, planning of further management, and coordination of care.   Disposition Plan: Status is: Inpatient  Remains inpatient appropriate because: Altered mental status not yet at baseline-hypercalcemia/AKI-on IV fluids/calcitonin injections.  Not stable for discharge.   Diet: Diet Order             DIET DYS 2 Room service appropriate? Yes; Fluid consistency: Thin  Diet effective now                     Antimicrobial agents: Anti-infectives (From admission, onward)    Start     Dose/Rate Route Frequency Ordered Stop   07/03/21 1000  cephALEXin (KEFLEX) capsule 500 mg  Status:  Discontinued        500 mg Oral Every 12 hours 07/03/21 0748 07/04/21 1032   06/27/21 1800  azithromycin (ZITHROMAX) 500 mg in sodium chloride 0.9 % 250 mL IVPB        500 mg 250 mL/hr over 60 Minutes Intravenous Every 24 hours 06/26/21 1952 07/01/21 2039   06/27/21 1600  cefTRIAXone (ROCEPHIN) 2 g in sodium chloride 0.9 % 100 mL IVPB        2 g 200 mL/hr over 30 Minutes Intravenous Every 24 hours 06/26/21 1952 07/01/21 1855   06/26/21 1430  cefTRIAXone (ROCEPHIN) 2 g in sodium chloride 0.9 % 100 mL IVPB  2 g 200 mL/hr over 30 Minutes Intravenous  Once 06/26/21 1427 06/26/21 1700   06/26/21 1430  azithromycin (ZITHROMAX) 500 mg in sodium chloride 0.9 % 250 mL IVPB        500 mg 250 mL/hr over 60 Minutes Intravenous  Once 06/26/21 1427 06/26/21 1722        MEDICATIONS: Scheduled Meds:  acetaminophen  1,000 mg Oral TID   diltiazem  120 mg Oral Q12H   feeding supplement  237 mL Oral TID BM   tamsulosin  0.4 mg Oral Daily   Continuous Infusions:    PRN Meds:.albuterol, antiseptic oral  rinse, glycopyrrolate **OR** glycopyrrolate **OR** glycopyrrolate, haloperidol lactate, HYDROmorphone (DILAUDID) injection, LORazepam **OR** LORazepam **OR** LORazepam, ondansetron **OR** ondansetron (ZOFRAN) IV, polyvinyl alcohol   I have personally reviewed following labs and imaging studies  LABORATORY DATA:  Recent Labs  Lab 07/02/21 0558 07/03/21 0037 07/04/21 0108  WBC 9.0 7.9 10.1  HGB 10.2* 9.0* 10.1*  HCT 31.5* 27.1* 30.7*  PLT 266 246 255  MCV 90.0 87.7 89.5  MCH 29.1 29.1 29.4  MCHC 32.4 33.2 32.9  RDW 17.7* 17.3* 18.0*  LYMPHSABS 0.7 0.6* 0.6*  MONOABS 0.6 0.4 0.3  EOSABS 0.1 0.0 0.0  BASOSABS 0.0 0.0 0.0    Recent Labs  Lab 06/30/21 0120 07/01/21 0341 07/02/21 0558 07/03/21 0037 07/04/21 0108  NA 151* 149* 148* 145 148*  K 3.4* 2.7* 3.4* 3.6 3.6  CL 128* 128* 127* 125* 125*  CO2 14* 14* 14* 14* 14*  GLUCOSE 107* 111* 113* 178* 125*  BUN 41* 45* 42* 44* 48*  CREATININE 2.81* 2.68* 2.73* 2.70* 2.69*  CALCIUM 10.9* 10.9* 10.5* 10.2 10.3  AST  --  23 29 18 21   ALT  --  20 17 18 21   ALKPHOS  --  65 54 52 63  BILITOT  --  0.9 0.2* 0.5 0.7  ALBUMIN  --  2.3* 2.2* 2.1* 2.4*  MG 2.1 1.8 1.8 1.9 2.0  BNP  --   --  670.1* 663.1* 468.3*   Lab Results  Component Value Date   TSH 0.590 06/27/2021         RADIOLOGY STUDIES/RESULTS: No results found.   LOS: 10 days   Signature  M.D on 07/06/2021 at 10:05 AM   -  To page go to [email or web address]

## 2021-07-06 NOTE — Discharge Summary (Signed)
Alfred Mercado [id number] DOB: 1935/12/09 DOA: 06/26/2021  PCP: Raymondo Band, MD  Admit date: 06/26/2021  Discharge date: 07/06/2021  Admitted From: Home   Disposition:  Res.Hospice   Recommendations for Outpatient Follow-up:   Follow up with PCP in 1-2 weeks  PCP Please obtain BMP/CBC, 2 view CXR in 1week,  (see Discharge instructions)   PCP Please follow up on the following pending results:    Home Health: None   Equipment/Devices: None  Consultations: None  Discharge Condition: Guarded CODE STATUS: DNR    Diet Recommendation: Soft diet for comfort if tolerated  Diet Order             DIET DYS 2 Room service appropriate? Yes; Fluid consistency: Thin  Diet effective now                    No chief complaint on file.    Brief history of present illness from the day of admission and additional interim summary    Patient is a 85 y.o. male with history of dementia, PAF on Eliquis, staghorn calculi-who was recently diagnosed with pneumonia at his nursing facility presented with altered mental status-upon further evaluation he was found to have hypercalcemia, AKI.  See below for further details.                                                                 Hospital Course    85 year old gentleman admitted with severe acute metabolic encephalopathy due to hypercalcemia of unclear etiology however at his age malignancy would be high up in the differential, he was severely dehydrated with AKI, extremely frail and cachectic at baseline.  Despite appropriate care patient made marginal improvement and eventually started to aspirate food and oral secretions, discussions were made between the treatment team which included palliative care team and patient's POA, he has now been transitioned to full  comfort care and is awaiting hospice bed.  All nonessential medications have been stopped goal of care is comfort.  Appears more uncomfortable morning of 07/06/2021, comfort medications adjusted further.  Suction as needed and monitor.       Other medical issues addressed earlier this admission are below.   Acute metabolic encephalopathy: Due to AKI/hypercalcemia-superimposed with dementia at baseline.  CT head nonacute, moving all 4 extremities without any focal deficits, continue treatment for hypercalcemia.   AKI on CKD stage IIIb: AKI multifactorial-hemodynamically mediated and from hypercalcemia.  UA without proteinuria, renal CT without hydronephrosis, repeat renal ultrasound nonacute on 07/03/2021.  Continue treatment for hypercalcemia.   Hypercalcemia: is unclear but likely due to underlying malignancy or sarcoid, ACE level and PTH RP levels are pending, continue with aggressive IV fluids with hydration, has already received Sulman calcitonin, 1 dose of Zometa.  Will monitor closely.  Hypernatremia: Due to extreme dehydration, continue D5W as needed.   Hyperchloremia.  Due to normal saline and LR infusion.  Now that dehydration has resolved.  Stop further IV fluids and monitor. ? RTA.   Metabolic acidosis: Likely starvation ketoacidosis and from AKI.  Starting oral bicarb supplementation-follow closely.   Probable aspiration pneumonia: Afebrile-CT chest as above-we will plan on at least 5 days of empiric antimicrobial therapy.  He has developed more upper airway gurgling on 07/04/2021 will reinvolve speech, elevate head of the bed and monitor.  Poor prognosis if he is aspirating repeatedly.   Asymptomatic bacteriuria: UA grossly abnormal-unable to obtain history to see if he has symptoms consistent with UTI (demented/agitated).  Thankfully urine culture negative-in any event already on IV antibiotics.     COVID-19 infection: Per ED notes and H&P-diagnosed 2 weeks ago-does not appear to  have any active infection at this point.  No need for isolation.   Normocytic anemia: Suspect has a combination of iron deficiency and anemia of chronic disease at baseline-worsened likely due to IV fluid hydration-required 2 units of PRBC transfusion.  No overt GI bleeding apparent even though FOBT positive.  Resume Eliquis.  Appreciate GI input.     Chronic atrial fibrillation now in RVR: We will resume Eliquis today.  Rate control much better on moderate to high-dose oral Cardizem along with as needed IV Cardizem as needed.  Echo pending.  TSH stable.   ?Dementia/cognitive dysfunction at baseline: Confused-expect worsening delirium during this hospital stay.   Goals of care: Unfortunate 85 year old with some amount of baseline dementia-now with severe encephalopathy in the setting of hypercalcemia/AKI/hypernatremia and aspiration pneumonia.  Given elevation in vitamin D3 levels-concerned that he may have underlying lymphoma/sarcoidosis.  He is extremely frail-and is really not a good candidate for aggressive care.  He unfortunately does not have any family-I have spoken with his contact person who is his pastor-Mr. Sherri Sear- DNR, Hospice.  Discharge diagnosis     Active Problems:   Chronic heart failure with preserved ejection fraction (HCC)   UTI (urinary tract infection)   Hypokalemia   Acute-on-chronic kidney injury (HCC)   Complicated UTI (urinary tract infection)   Severe sepsis (HCC)   Iron deficiency anemia   Generalized weakness   Hypotension   CAP (community acquired pneumonia)   COVID-19 virus infection   Occult blood positive stool   Hypercalcemia   Elevated troponin   Protein-calorie malnutrition, severe    Discharge instructions    Discharge Instructions     Discharge instructions   Complete by: As directed    Disposition.  Residential hospice Condition.  Guarded CODE STATUS.  DNR Activity.  With assistance as tolerated, full fall precautions. Diet.   Soft with feeding assistance and aspiration precautions. Goal of care.  Comfort.   Increase activity slowly   Complete by: As directed        Discharge Medications   Allergies as of 07/06/2021   No Known Allergies      Medication List     STOP taking these medications    apixaban 2.5 MG Tabs tablet Commonly known as: ELIQUIS   cefTRIAXone 1 g injection Commonly known as: ROCEPHIN   diltiazem 120 MG 24 hr capsule Commonly known as: CARDIZEM CD   diltiazem 60 MG 12 hr capsule Commonly known as: CARDIZEM SR Replaced by: diltiazem 120 MG tablet   furosemide 20 MG tablet Commonly known as: LASIX   furosemide 40 MG tablet Commonly known as: Lasix  hydrOXYzine 10 MG tablet Commonly known as: ATARAX/VISTARIL   metoprolol tartrate 25 MG tablet Commonly known as: LOPRESSOR   multivitamin with minerals Tabs tablet   nicotine 7 mg/24hr patch Commonly known as: NICODERM CQ - dosed in mg/24 hr   potassium chloride 10 MEQ tablet Commonly known as: KLOR-CON   Spiriva Respimat 2.5 MCG/ACT Aers Generic drug: Tiotropium Bromide Monohydrate   vitamin C 500 MG tablet Commonly known as: ASCORBIC ACID   Vitamin D (Ergocalciferol) 1.25 MG (50000 UNIT) Caps capsule Commonly known as: DRISDOL       TAKE these medications    acetaminophen 325 MG tablet Commonly known as: TYLENOL Take 650 mg by mouth at bedtime.   albuterol 108 (90 Base) MCG/ACT inhaler Commonly known as: VENTOLIN HFA Inhale 2 puffs into the lungs every 6 (six) hours as needed for wheezing or shortness of breath.   Anoro Ellipta 62.5-25 MCG/ACT Aepb Generic drug: umeclidinium-vilanterol Inhale 1 puff into the lungs daily.   busPIRone 5 MG tablet Commonly known as: BUSPAR Take 2.5 mg by mouth daily.   diltiazem 120 MG tablet Commonly known as: CARDIZEM Take 1 tablet (120 mg total) by mouth every 12 (twelve) hours. Replaces: diltiazem 60 MG 12 hr capsule   docusate sodium 100 MG  capsule Commonly known as: COLACE Take 100 mg by mouth every other day.   famotidine-calcium carbonate-magnesium hydroxide 10-800-165 MG chewable tablet Commonly known as: PEPCID COMPLETE Chew 1 tablet by mouth daily.   Ferrous Gluconate 324 (37.5 Fe) MG Tabs Take 1 tablet by mouth every other day.   melatonin 3 MG Tabs tablet Take 6 mg by mouth at bedtime.   Misc. Devices Misc Power wheelchair.  Diagnosis atrial fibrillation, gait abnormality   tamsulosin 0.4 MG Caps capsule Commonly known as: FLOMAX Take 1 capsule (0.4 mg total) by mouth daily.          Major procedures and Radiology Reports - PLEASE review detailed and final reports thoroughly  -        CT HEAD WO CONTRAST (5MM)  Result Date: 06/26/2021 CLINICAL DATA:  Altered mental status. EXAM: CT HEAD WITHOUT CONTRAST TECHNIQUE: Contiguous axial images were obtained from the base of the skull through the vertex without intravenous contrast. COMPARISON:  Head CT dated 01/24/2021. FINDINGS: Brain: Mild age-related atrophy and chronic microvascular ischemic changes. There is no acute intracranial hemorrhage. No mass effect or midline shift. Similar appearance of left frontotemporal low attenuating extra-axial collection, likely old hygroma. Vascular: No hyperdense vessel or unexpected calcification. Skull: Normal. Negative for fracture or focal lesion. Sinuses/Orbits: Mild diffuse mucoperiosteal thickening of paranasal sinuses. No air-fluid level. The mastoid air cells are clear. Other: None IMPRESSION: 1. No acute intracranial pathology. 2. Mild age-related atrophy and chronic microvascular ischemic changes. 3. Stable left frontotemporal low attenuating extra-axial collection, likely old hygroma. Electronically Signed   By: Anner Crete M.D.   On: 06/26/2021 23:25   CT CHEST WO CONTRAST  Result Date: 06/29/2021 CLINICAL DATA:  85 year old male with history of persistent cough. Hypercalcemia. Evaluate for potential  lymphoma or sarcoidosis. EXAM: CT CHEST WITHOUT CONTRAST TECHNIQUE: Multidetector CT imaging of the chest was performed following the standard protocol without IV contrast. COMPARISON:  Chest CT 01/24/2021. FINDINGS: Cardiovascular: Heart size is normal. There is no significant pericardial fluid, thickening or pericardial calcification. There is aortic atherosclerosis, as well as atherosclerosis of the great vessels of the mediastinum and the coronary arteries, including calcified atherosclerotic plaque in the left main, left anterior descending, left  circumflex and right coronary arteries. Mediastinum/Nodes: No pathologically enlarged mediastinal or hilar lymph nodes. Small hiatal hernia. No axillary lymphadenopathy. Lungs/Pleura: Moderate chronic right pleural effusion with areas of probable rounded atelectasis in the periphery of the right lower and middle lobes, similar to the prior study. Small left pleural effusion is new compared to the prior exam. Assessment of the lung parenchyma is limited by considerable patient respiratory motion. With these limitations in mind there are patchy areas of peribronchovascular ground-glass attenuation in the lungs bilaterally, most evident in the right upper lobe and left lower lobe, concerning for potential bronchopneumonia. Diffuse bronchial wall thickening with mild to moderate centrilobular and paraseptal emphysema. Upper Abdomen: Aortic atherosclerosis. Musculoskeletal: There are no aggressive appearing lytic or blastic lesions noted in the visualized portions of the skeleton. IMPRESSION: 1. No definitive imaging findings to suggest lymphoma or sarcoidosis. 2. Patchy areas of peribronchovascular ground-glass attenuation in the lungs, most severe in the right upper lobe and left lower lobe concerning for probable bronchopneumonia. 3. Moderate chronic right pleural effusion with areas of rounded atelectasis in the right middle and lower lobes, similar to the prior study.  4. New small left pleural effusion. 5. Diffuse bronchial wall thickening with mild to moderate centrilobular and paraseptal emphysema; imaging findings suggestive of underlying COPD. 6. Aortic atherosclerosis, in addition to left main and 3 vessel coronary artery disease. Aortic Atherosclerosis (ICD10-I70.0) and Emphysema (ICD10-J43.9). Electronically Signed   By: Vinnie Langton M.D.   On: 06/29/2021 12:14   US RENAL  Result Date: 07/03/2021 CLINICAL DATA:  Renal dysfunction EXAM: RENAL / URINARY TRACT ULTRASOUND COMPLETE COMPARISON:  None. FINDINGS: According to the note by the technologist, examination was technically difficult due to the patient's inability to cooperate adequately for optimal positioning. Right Kidney: Renal measurements: 8.8 x 606 x 5.5 cm = volume: 167 mL. There is no hydronephrosis. There are multiple hyperechoic structures in right kidney largest measuring 2.3 cm. There is increased cortical echogenicity and cortical thinning. There is no hydronephrosis. Left Kidney: Renal measurements: 8.3 x 5.7 x 4.6 cm = volume: 113 mL. There is no hydronephrosis. Left renal stones are seen largest measuring 10 mm. There is no perinephric fluid collection. Bladder: Urinary bladder is unremarkable. Ureteral jets were not observed during the study. Other: None. IMPRESSION: There is no hydronephrosis.  Bilateral renal stones. Electronically Signed   By: Elmer Picker M.D.   On: 07/03/2021 12:08   DG Chest Port 1 View  Result Date: 07/04/2021 CLINICAL DATA:  Shortness of breath. EXAM: PORTABLE CHEST 1 VIEW COMPARISON:  07/01/2021 FINDINGS: 0809 hours. The cardio pericardial silhouette is enlarged. Right base collapse/consolidation again noted with small moderate right pleural effusion, similar to prior. Patchy airspace disease in the left base is not substantially changed. The visualized bony structures of the thorax show no acute abnormality. Skin folds noted over both lower lungs. Telemetry  leads overlie the chest. IMPRESSION: No substantial change in exam. Right base collapse/consolidation with small moderate right pleural effusion. Electronically Signed   By: Misty Stanley M.D.   On: 07/04/2021 10:47   DG Chest Port 1 View  Result Date: 07/01/2021 CLINICAL DATA:  Altered mental status and shortness of breath. EXAM: PORTABLE CHEST 1 VIEW COMPARISON:  Chest CT June 29, 2021 and radiograph June 26, 2021 FINDINGS: The heart size and mediastinal contours are partially obscured but appear unchanged. Similar moderate right pleural effusion. No left-sided effusion visualized. Similar bilateral interstitial opacities, favored to reflect bronchopneumonia. The visualized skeletal structures are unremarkable.  IMPRESSION: 1. Similar moderate right pleural effusion. 2. Similar bilateral interstitial opacities, favored to reflect bronchopneumonia. Electronically Signed   By: Dahlia Bailiff M.D.   On: 07/01/2021 17:36   DG Chest Port 1 View  Result Date: 06/26/2021 CLINICAL DATA:  Questionable sepsis - evaluate for abnormality More lethargic than normal. EXAM: PORTABLE CHEST 1 VIEW COMPARISON:  Radiograph 03/01/2021.  Chest CT 01/24/2021 FINDINGS: Patient had difficulty tolerating the exam, and patient's hand partially obscures evaluation of the right upper chest. Similar volume loss to prior radiograph with right-sided pleural effusion. Slight increase fluid in the right minor fissure. Vague reticular opacities in the right perihilar lung. Unchanged heart size. Unchanged mediastinal contours with aortic atherosclerosis and tortuosity. No visualized left pleural effusion. No pneumothorax. IMPRESSION: Chronic unchanged right-sided pleural effusion with unchanged volume loss from prior radiograph. Vague reticular opacities in the right perihilar lung may be vascular or represent atypical infection. Pulmonary edema is felt less likely. Electronically Signed   By: Keith Rake M.D.   On: 06/26/2021  15:33   CT RENAL STONE STUDY  Result Date: 06/26/2021 CLINICAL DATA:  Acute renal failure EXAM: CT ABDOMEN AND PELVIS WITHOUT CONTRAST TECHNIQUE: Multidetector CT imaging of the abdomen and pelvis was performed following the standard protocol without IV contrast. COMPARISON:  None. FINDINGS: LOWER CHEST: Small right pleural effusion and right basilar atelectasis. HEPATOBILIARY: Normal hepatic contours. No intra- or extrahepatic biliary dilatation. Normal gallbladder. PANCREAS: Normal pancreas. No ductal dilatation or peripancreatic fluid collection. SPLEEN: Normal. ADRENALS/URINARY TRACT: The adrenal glands are normal. There are multiple large bilateral renal calculi, as previously demonstrated. On the right, stones extend into the renal pelvis and measure up to 4.7 cm. No hydronephrosis. The urinary bladder is normal for degree of distention STOMACH/BOWEL: There is no hiatal hernia. Normal duodenal course and caliber. No small bowel dilatation or inflammation. Moderate amount of stool in the colon. Normal appendix. VASCULAR/LYMPHATIC: There is calcific atherosclerosis of the abdominal aorta. No lymphadenopathy. REPRODUCTIVE: There are calcifications within the normal-sized prostate. Symmetric seminal vesicles. MUSCULOSKELETAL. Chronic compression deformity at L1 OTHER: Small amount of right lower quadrant subcutaneous gas. IMPRESSION: 1. No acute abnormality of the abdomen or pelvis. 2. Multiple large bilateral renal calculi, as previously demonstrated. On the right, stones extend into the renal pelvis and measure up to 4.7 cm. No hydronephrosis. 3. Small right pleural effusion and right basilar atelectasis. Aortic Atherosclerosis (ICD10-I70.0). Electronically Signed   By: Ulyses Jarred M.D.   On: 06/26/2021 23:45      Today   Subjective    Patient in bed, appears very weak, confused , unable to answer questions, appears in mild distress.   Objective   Blood pressure 98/60, pulse (!) 120,  temperature (!) 97.4 F (36.3 C), temperature source Axillary, resp. rate 20, height 6\' 1"  (1.854 m), weight 67.6 kg, SpO2 100 %.   Intake/Output Summary (Last 24 hours) at 07/06/2021 1209 Last data filed at 07/06/2021 0602 Gross per 24 hour  Intake --  Output 400 ml  Net -400 ml    Exam  Awake but confused, appears very weak and frail, has some upper airway gurgling, .AT,PERRAL Supple Neck, No JVD,   Symmetrical Chest wall movement, Good air movement bilaterally, CTAB RRR,No Gallops, Rubs or new Murmurs,  +ve B.Sounds, Abd Soft, No tenderness,   No Cyanosis, Clubbing or edema    Data Review   CBC w Diff:  Lab Results  Component Value Date   WBC 10.1 07/04/2021   HGB 10.1 (L)  07/04/2021   HGB 10.1 (L) 11/30/2020   HCT 30.7 (L) 07/04/2021   HCT 30.6 (L) 11/30/2020   PLT 255 07/04/2021   PLT 346 11/30/2020   LYMPHOPCT 6 07/04/2021   MONOPCT 3 07/04/2021   EOSPCT 0 07/04/2021   BASOPCT 0 07/04/2021    CMP:  Lab Results  Component Value Date   NA 148 (H) 07/04/2021   NA 140 11/30/2020   K 3.6 07/04/2021   CL 125 (H) 07/04/2021   CO2 14 (L) 07/04/2021   BUN 48 (H) 07/04/2021   BUN 11 11/30/2020   CREATININE 2.69 (H) 07/04/2021   PROT 5.2 (L) 07/04/2021   ALBUMIN 2.4 (L) 07/04/2021   BILITOT 0.7 07/04/2021   ALKPHOS 63 07/04/2021   AST 21 07/04/2021   ALT 21 07/04/2021  .   Total Time in preparing paper work, data evaluation and todays exam - 91 minutes  Lala Lund M.D on 07/06/2021 at 12:09 PM  [facility]

## 2021-07-06 NOTE — TOC Transition Note (Signed)
Transition of Care [facility]) - CM/SW Discharge Note   Patient Details  Name: Alfred Mercado MRN: [id number] Date of Birth: 1936-08-16  Transition of Care [facility]) CM/SW Contact:  Mearl Latin, LCSW Phone Number: 07/06/2021, 5:23 PM   Clinical Narrative:    Patient will DC to: [facility] Anticipated DC date: 07/06/21 Family notified: Virl Diamond Transport by: Sharin Mons   Per MD patient ready for DC to [facility]. RN to call report prior to discharge [phone]). RN, patient, patient's family, and facility notified of DC. Discharge Summary sent to facility. DC packet on chart with signed DNR. Ambulance transport requested for patient.   CSW will sign off for now as social work intervention is no longer needed. Please consult [place] again if new needs arise.     Final next level of care: Hospice Medical Facility Barriers to Discharge: Barriers Resolved   Patient Goals and CMS Choice Patient states their goals for this hospitalization and ongoing recovery are:: comfort CMS Medicare.gov Compare Post Acute Care list provided to:: Patient Represenative (must comment) Choice offered to / list presented to :  (friend)  Discharge Placement              Patient chooses bed at:  [facility] place) Patient to be transferred to facility by: [facility] Name of family member notified: Virl Diamond Patient and family notified of of transfer: 07/06/21  Discharge Plan and Services In-house Referral: Clinical Social Work   Post Acute Care Choice: Skilled Nursing Facility                               Social Determinants of Health (SDOH) Interventions     Readmission Risk Interventions Readmission Risk Prevention Plan 06/28/2021 03/17/2021  Transportation Screening Complete Complete  Medication Review Oceanographer) Complete Referral to Pharmacy  PCP or Specialist appointment within 3-5 days of discharge Complete Complete  HRI or Home Care Consult Complete Complete  SW Recovery  Care/Counseling Consult Complete Complete  Palliative Care Screening Complete Not Applicable  Skilled Nursing Facility Complete Complete  Some recent data might be hidden

## 2021-07-06 NOTE — Progress Notes (Addendum)
Civil engineer, contracting [facility]) Hospital Liaison note.    5:16pm: TOC made aware consents are complete at this time; transportation can be arranged.   Chart reviewed and eligibility confirmed for [facility]. Spoke with Renita Papa, pt's pastor and decision-maker, to confirm interest and explain services. [name] agreeable to transfer today. TOC aware.    [facility] will notify TOC when registration paperwork has been completed to arrange transport.   RN please call report to [phone].  Please be sure a signed DNR form is ready to transport with the patient.  Thank you for the opportunity to participate in this patient's care.  Gillian Scarce, BSN, RN [facility] Liaison (listed on [place] under Hospice[facility])    [phone] (24h on call)

## 2021-07-27 DEATH — deceased

## 2021-12-30 IMAGING — CT CT CHEST W/O CM
2 of 4 series · 15 of 36 positions shown, 18 images · non-contrast
Comparison: Portable chest earlier today. Chest CTA 07/11/2019.

CLINICAL DATA: 84-year-old male with shortness of breath and
tachycardia. Right pleural effusion versus consolidation.

EXAM:
CT CHEST WITHOUT CONTRAST
TECHNIQUE: Multidetector CT imaging of the chest was performed following the
standard protocol without IV contrast.

[Series 4: thorax 2.0 · axial · 0.77mm/px · z∈[-535,-233]mm · 12 of 169 slices shown, 15 images]
[im 9/169  mediastinal]
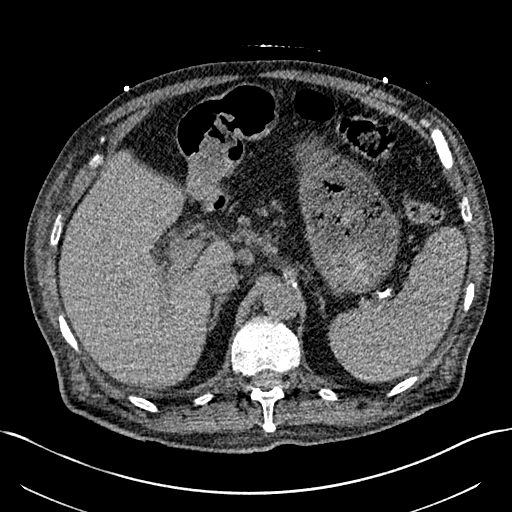
[im 9/169  lung]
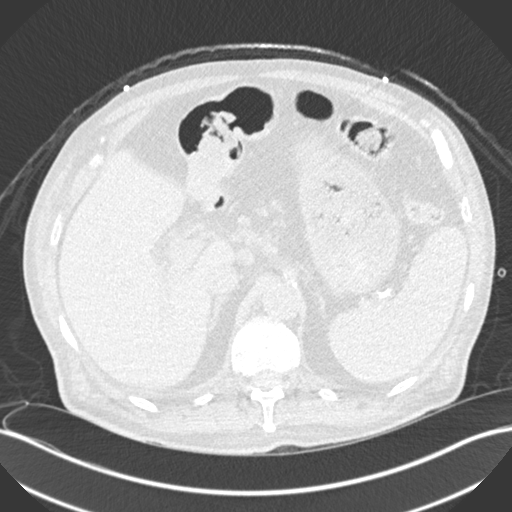
[im 27/169  lung]
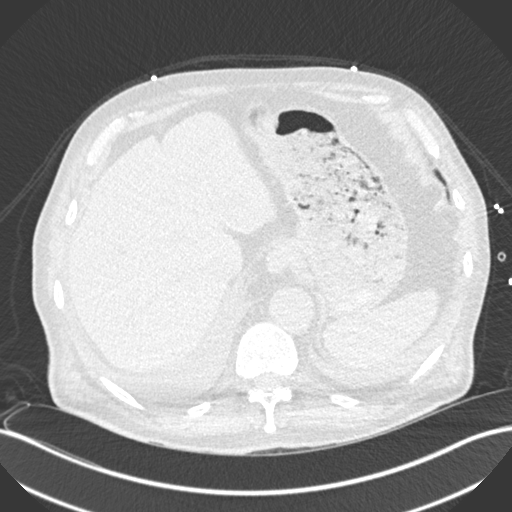
[im 36/169  lung]
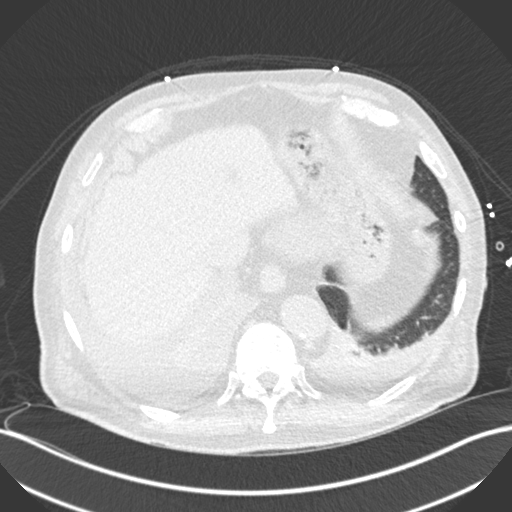
[im 54/169  lung]
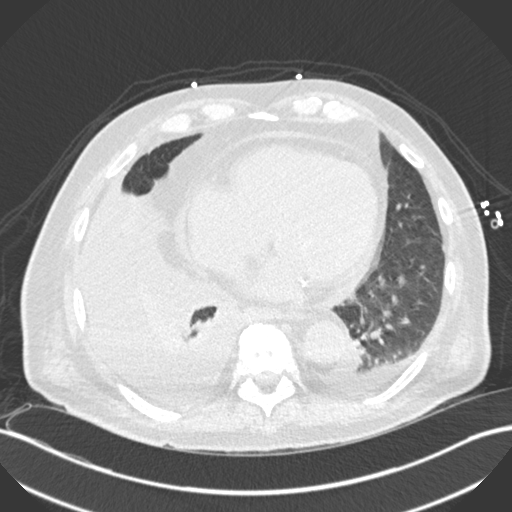
[im 62/169  mediastinal]
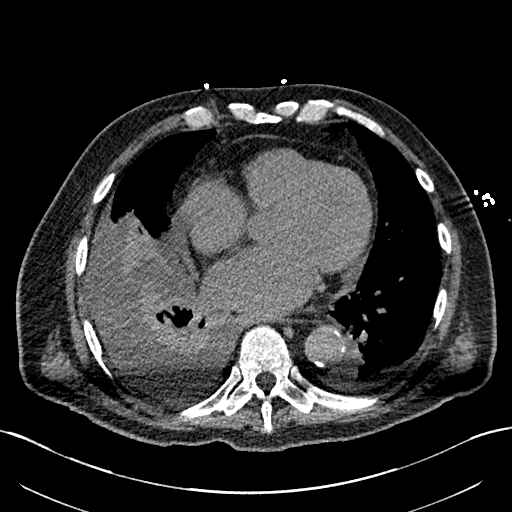
[im 62/169  lung]
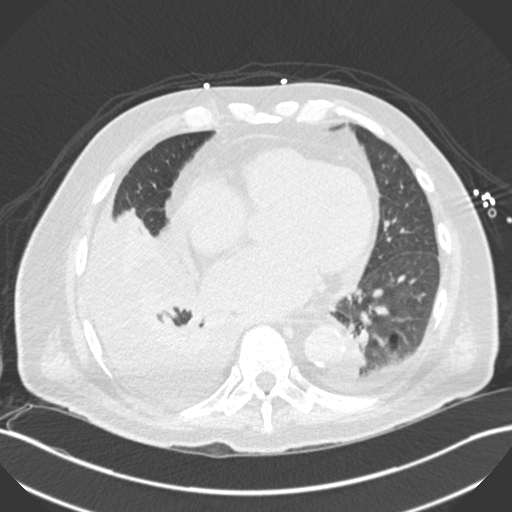
[im 80/169  lung]
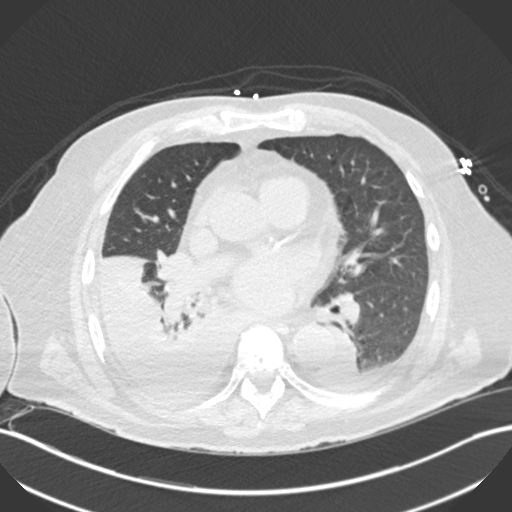
[im 89/169  lung]
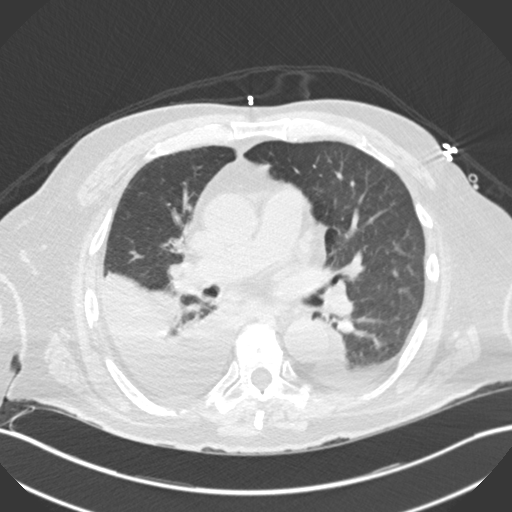
[im 107/169  lung]
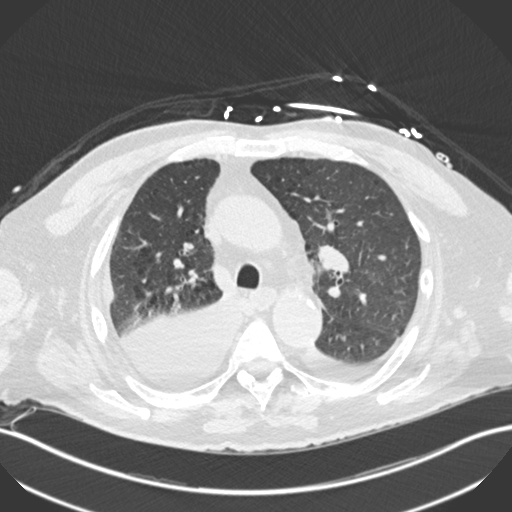
[im 115/169  mediastinal]
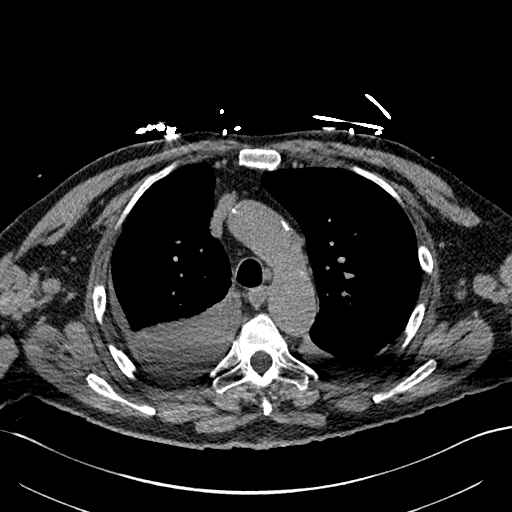
[im 115/169  lung]
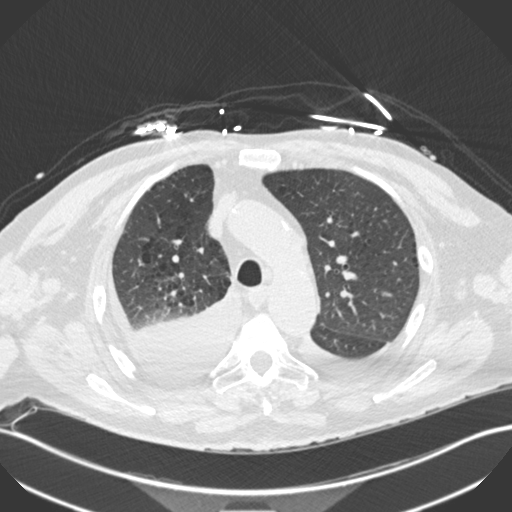
[im 133/169  lung]
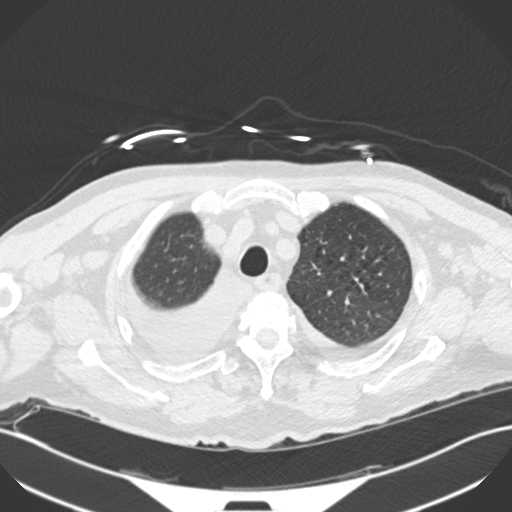
[im 142/169  lung]
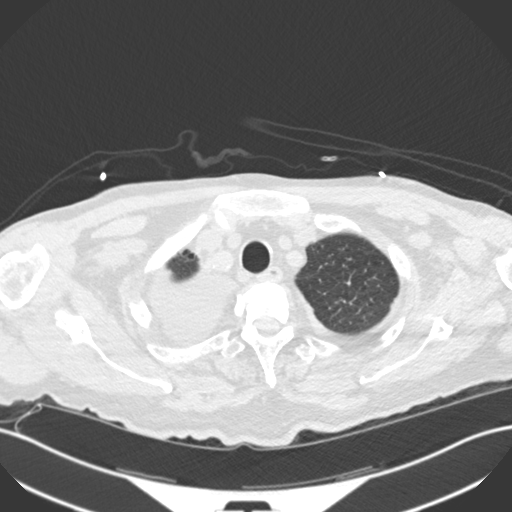
[im 160/169  lung]
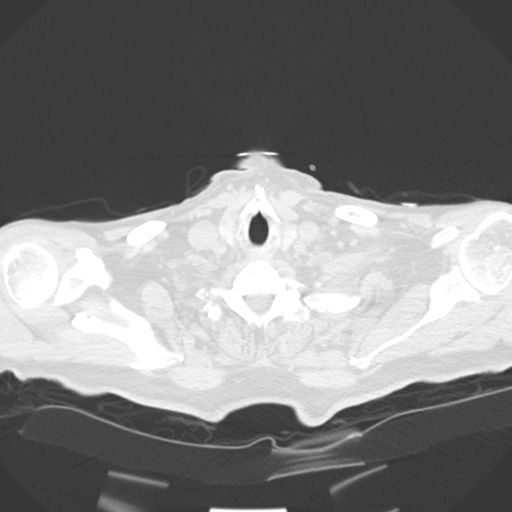

[Series 6: coronal · coronal · 0.67mm/px · 3 of 115 slices shown]
[im 23/115  lung]
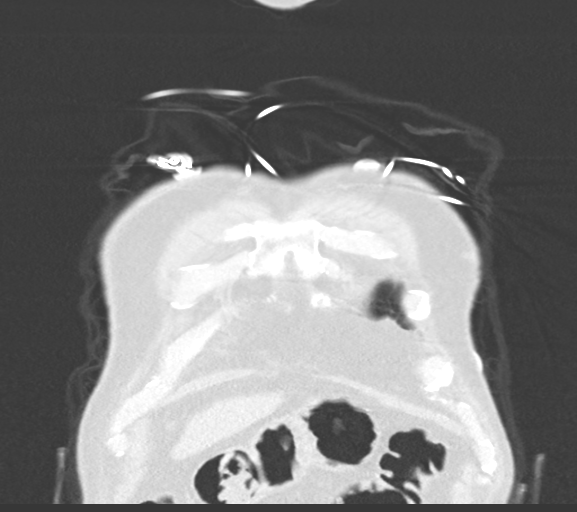
[im 46/115  lung]
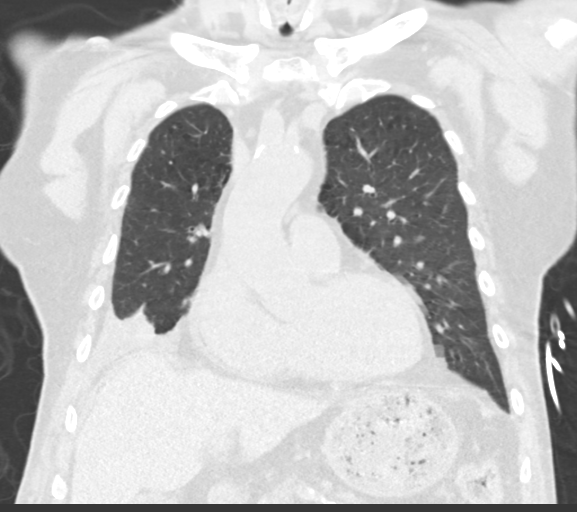
[im 69/115  lung]
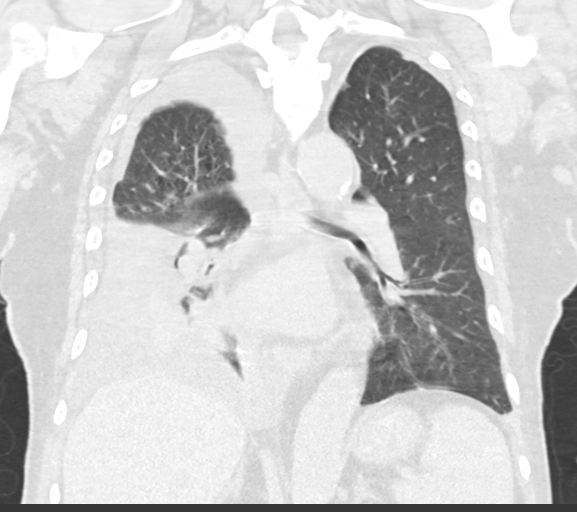

[15 of 36 positions shown; findings below may reference images not displayed]

FINDINGS: Cardiovascular: Vascular patency is not evaluated in the absence of
IV contrast. Calcified coronary artery and aortic atherosclerosis.
Cardiomegaly appears stable since [REDACTED]. No pericardial effusion.

Mediastinum/Nodes: Negative. No mediastinal lymphadenopathy.

Lungs/Pleura: Moderate layering right pleural effusion, similar size
to that in [REDACTED]. Simple fluid density suggesting a transudate.

Superimposed compressive right lung atelectasis, a especially in the
lower lobe.

Upper lobe centrilobular emphysema more apparent on the right. The
trachea through mainstem bronchi are patent although there is sub
solid material in the right bronchus intermedius on series 8, image
85 which is new since [REDACTED].

In the left lung there is a small layering effusion which has
decreased since [REDACTED]. Mild left lung atelectasis. No
inflammatory appearing pulmonary opacity.

Upper Abdomen: Stable visible noncontrast liver, gallbladder,
spleen, pancreas and adrenal glands. Negative visible bowel in the
upper abdomen.

Musculoskeletal: Advanced degenerative changes in the thoracic and
visible cervical spine. Stable small round sclerotic focus in the
posterior right 8th rib, probably a benign bone island. No acute
osseous abnormality identified.
IMPRESSION: 1. Moderate layering right pleural effusion with compressive
atelectasis, similar to that in [REDACTED]. A small left pleural
effusion has decreased since [DATE]. Evidence of retained secretions in the bronchus intermedius, but
no pulmonary opacity suspicious for pneumonia. Emphysema
([id number]).
3. Cardiomegaly. Coronary artery and Aortic atherosclerosis
([id number]).

## 2022-01-02 IMAGING — DX DG CHEST 1V
1 series · 1 of 1 positions shown · non-contrast
Comparison: Chest x-ray 10/16/2019.

CLINICAL DATA: 84-year-old male status post right thoracentesis.

EXAM:
CHEST  1 VIEW

[chest ap]
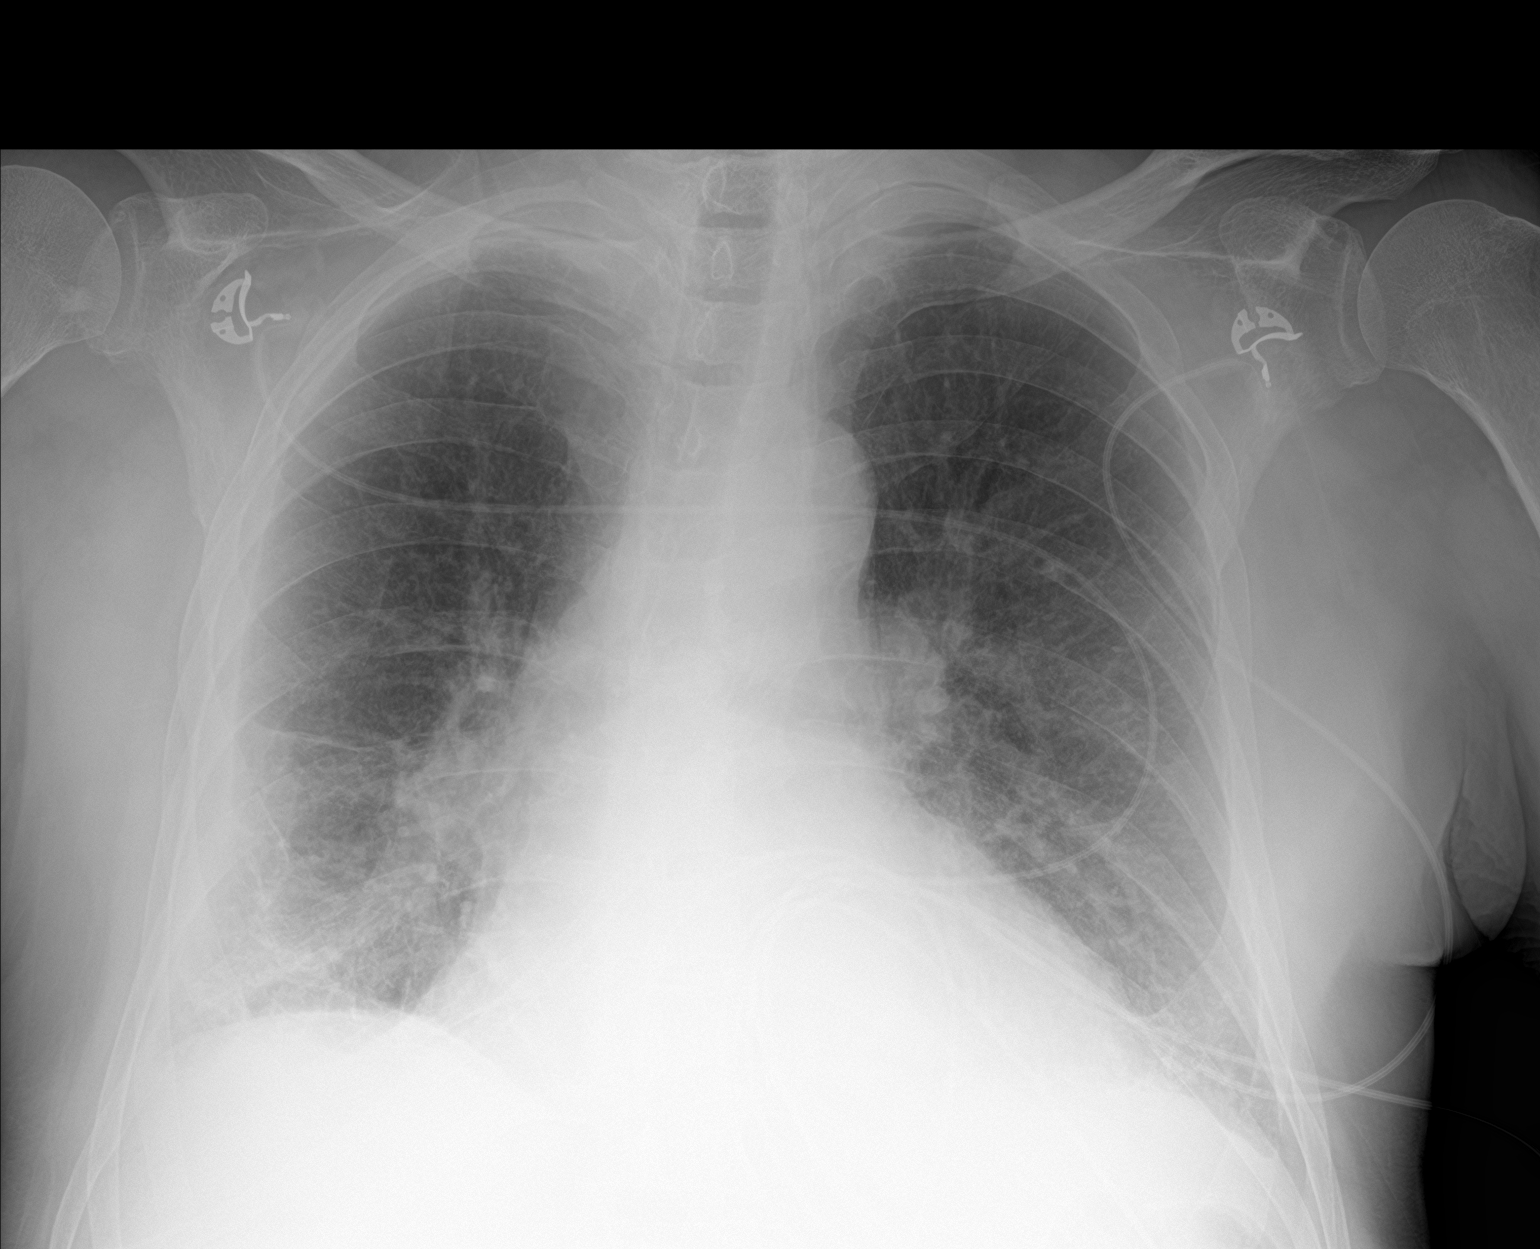

[1 of 1 positions shown; findings below may reference images not displayed]

FINDINGS: Previously noted right pleural effusion has decreased, currently
small. This may be partially loculated laterally. No appreciable
right pneumothorax. Bibasilar opacities which may reflect areas of
atelectasis and/or consolidation. No left pleural effusion. No
evidence of pulmonary edema. Heart size is mildly enlarged. Upper
mediastinal contours are within normal limits.
IMPRESSION: 1. Decreased right-sided pleural effusion following thoracentesis.
No pneumothorax or other acute complicating features.
2. Bibasilar opacities which may reflect areas of atelectasis and/or
consolidation.

## 2023-04-13 IMAGING — US IR NEPHROSTOMY PLACEMENT RIGHT
1 series · 4 of 4 positions shown · non-contrast
Comparison: CT chest, abdomen and pelvis-01/24/2021;

INDICATION: Significant right-sided staghorn calculus with findings worrisome
for emphysematous pyelonephritis. Please perform image guided
placement of right-sided nephrostomy catheter for infection source
control purposes.

[Series 1: ir nephrostomy placement right · 4 of 4 slices shown]
[im 1/4]
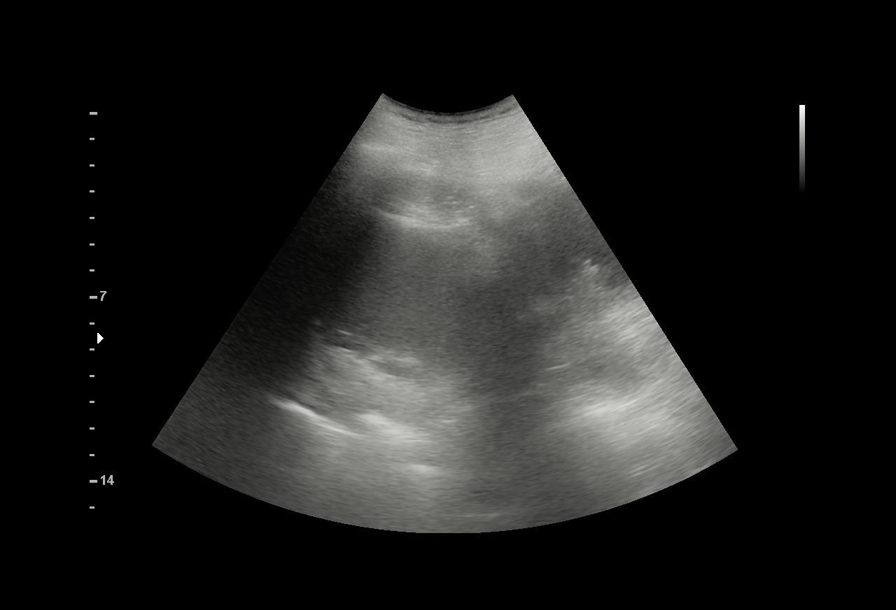
[im 2/4]
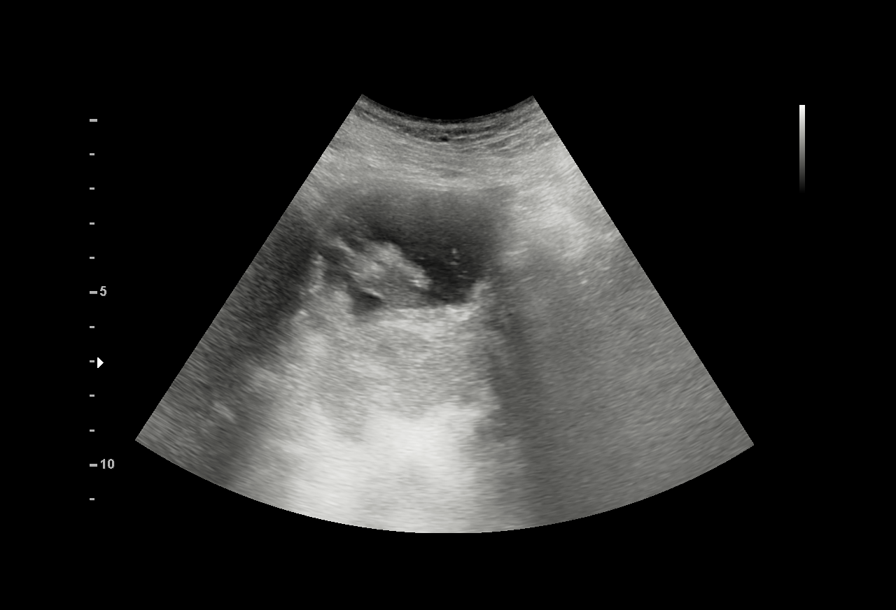
[im 3/4]
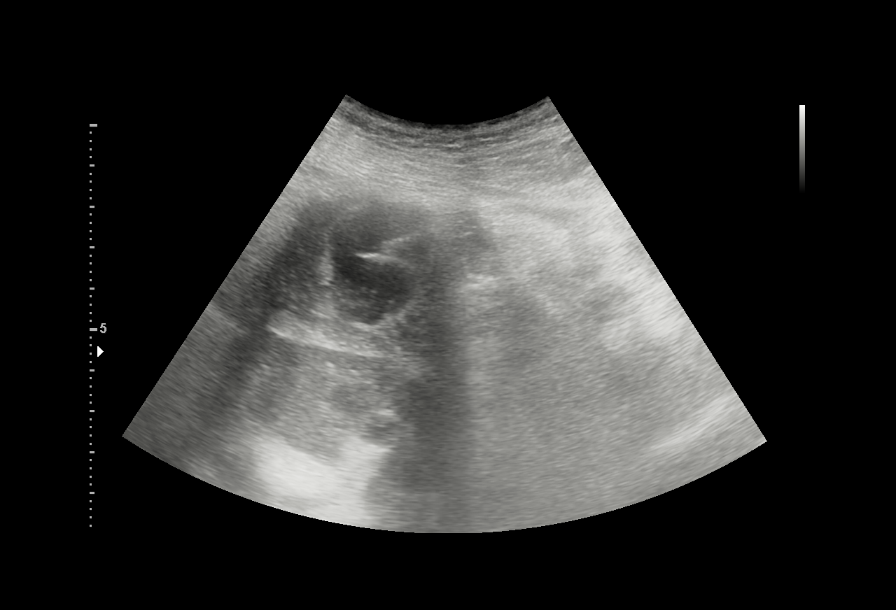
[im 4/4]
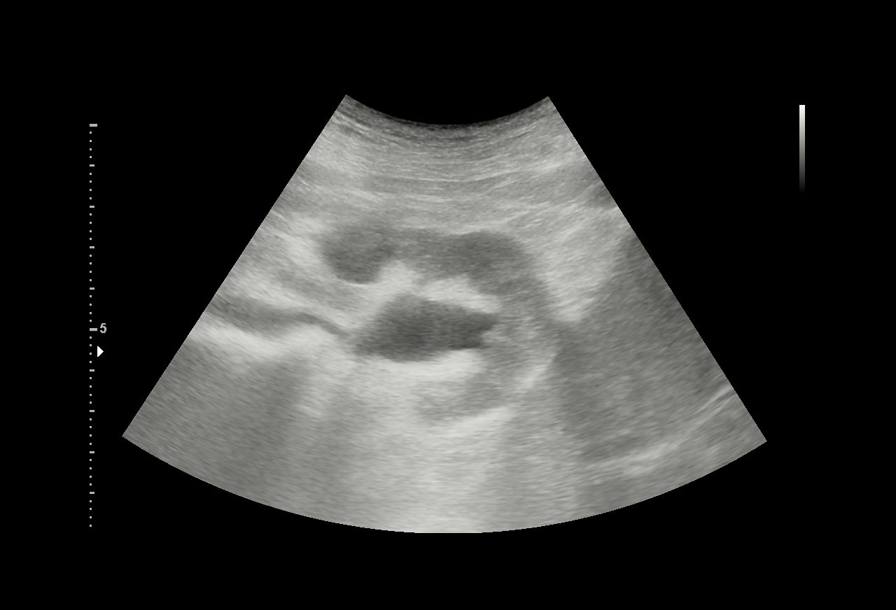

[4 of 4 positions shown; findings below may reference images not displayed]

Additionally, the patient was found to have an indeterminate
left-sided perinephric collection. Please perform ultrasound-guided
biopsy and/or drainage catheter placement for diagnostic and
potentially therapeutic purposes.

EXAM:
1. ULTRASOUND-GUIDED LEFT PERINEPHRIC ABSCESS DRAINAGE CATHETER
PLACEMENT
2. ULTRASOUND AND FLUOROSCOPIC GUIDED PLACEMENT OF RIGHT NEPHROSTOMY
TUBE
lumbar spine
MRI-01/24/2021

MEDICATIONS:
Ancef 2 gm IV; The antibiotic was administered in an appropriate
time frame prior to skin puncture.

ANESTHESIA/SEDATION:
Moderate (conscious) sedation was employed during this procedure. A
total of Versed 2 mg and Fentanyl 100 mcg was administered
intravenously.

Moderate Sedation Time: 30 minutes. The patient's level of
consciousness and vital signs were monitored continuously by
radiology nursing throughout the procedure under my direct
supervision.

CONTRAST:  15mL Isovue 300 - administered into the right renal
collecting system

FLUOROSCOPY TIME:  3 minutes, 36 seconds (54 mGy)

COMPLICATIONS:
None immediate.

PROCEDURE:
The procedure, risks, benefits, and alternatives were explained to
the patient, questions were encouraged and answered and informed
consent was obtained. A timeout was performed prior to the
initiation of the procedure.

The operative sites were prepped and draped in the usual sterile
fashion and a sterile drape was applied covering the operative
field. A sterile gown and sterile gloves were used for the
procedure. Local anesthesia was provided with 1% Lidocaine with
epinephrine.

Preprocedural spot fluoroscopic image demonstrates opacities
overlying expected location of the bilateral renal fossa.

Sonographic evaluation was performed of the left kidney
demonstrating an approximately 6.3 x 4.3 cm complex apparent
collection arising from the superior pole of the left kidney (image
2) correlating with the indeterminate collection/mass seen on
preceding abdominal CT image 80, series 3) multiple ultrasound
images were saved procedural documentation purposes.

Under direct ultrasound guidance, the indeterminate complex
collection was accessed with a 17 gauge trocar needle. At this
point, a small amount of purulent fluid was aspirated confirming the
collection likely represents a perinephric abscess.

As such, a short Amplatz wire was coiled within the collection. The
track was dilated ultimately allowing placement of a 10 French
percutaneous drainage catheter with end coiled and locked within the
complex collection. At this point, approximately 50 cc of purulent
fluid was aspirated. A representative sample of aspirated fluid was
capped and sent to the laboratory for analysis

Drainage catheter was flushed with a small amount of saline and
connected to a JP bulb. The drainage catheter was secured in place
within interrupted suture and a Stat Lock device.

_________________________________________________________

Attention was now paid towards placement of the right-sided
percutaneous nephrostomy catheter.

Sonographic evaluation was performed of the right kidney
demonstrating moderate dilatation involving the superior pole of the
right kidney with echogenic shadowing stones seen at the level of
the inferior pole at the level of the renal pelvis.

Under direct ultrasound guidance, the dilated non stone containing
calyx within the posterosuperior aspect of the left kidney was
targeted with a 20 gauge needle was advanced into the renal
collecting system. An ultrasound image documentation was performed.
Access within the collecting system was confirmed with the efflux of
urine followed by limited contrast injection.

Under intermittent fluoroscopic guidance, an 0.018 wire was advanced
into the collecting system and the tract was dilated with an
Accustick stent. Next, over a short Amplatz wire, the track was
further dilated ultimately allowing placement of a 10-French
percutaneous nephrostomy catheter with end coiled and locked within
the renal pelvis.

Contrast was injected and several spot fluoroscopic images were
obtained in various obliquities. The catheter was secured at the
skin entrance site with an interrupted suture and a stat lock device
and connected to a gravity bag.

Dressings were applied. The patient tolerated both procedures well
without immediate postprocedural complication.
FINDINGS: Fluoroscopic demonstrates stones overlying expected location of the
bilateral kidneys with dominant opacity overlying expected location
of the right renal pelvis measuring at least 3.2 x 2.8 cm.
Additional calculi overlie the expected location of the mid and
inferior poles of the right kidney compatible with findings on
preceding abdominal CT. Dominant left-sided opacity overlying the
left renal fossa measures approximately 3.2 x 2.8 cm.

Sonographic evaluation of the left kidney demonstrates an
approximately 6.3 x 4.3 complex collection superior to the left
kidney which was targeted with a 17 gauge needle yielding the return
of purulent material. As such, a 10 French percutaneous drainage
catheter was placed in this presumed perinephric abscess.

Ultrasound scanning demonstrates moderate dilatation involving the
superior pole of the right kidney with echogenic shadowing stones
within the mid and inferior pole calices as was demonstrated on
preceding abdominal CT.

Under a combination of ultrasound and fluoroscopic guidance, a
posterior superior calix was targeted allowing placement of a
10-French percutaneous nephrostomy catheter with end coiled and
locked within the renal pelvis. Contrast injection confirmed
appropriate positioning.
IMPRESSION: 1. Successful ultrasound-guided placement of left-sided perinephric
abscess drainage catheter yielding 50 cc of purulent fluid.
2. Successful ultrasound and fluoroscopic guided placement of a
right sided 10 French PCN. This nephrostomy catheter may be utilized
for future PCNL usage as indicated.
